# Patient Record
Sex: Female | Born: 1955 | Race: White | Hispanic: No | Marital: Married | Smoking: Former smoker
Health system: Southern US, Community
[De-identification: ages and names within clinical notes are randomized; demographics above are authoritative.]

## PROBLEM LIST (undated history)

## (undated) DIAGNOSIS — F418 Other specified anxiety disorders: Secondary | ICD-10-CM

## (undated) DIAGNOSIS — F419 Anxiety disorder, unspecified: Secondary | ICD-10-CM

## (undated) DIAGNOSIS — J189 Pneumonia, unspecified organism: Secondary | ICD-10-CM

## (undated) DIAGNOSIS — C3491 Malignant neoplasm of unspecified part of right bronchus or lung: Secondary | ICD-10-CM

## (undated) DIAGNOSIS — F329 Major depressive disorder, single episode, unspecified: Secondary | ICD-10-CM

## (undated) DIAGNOSIS — F32A Depression, unspecified: Secondary | ICD-10-CM

## (undated) HISTORY — DX: Other specified anxiety disorders: F41.8

## (undated) HISTORY — DX: Malignant neoplasm of unspecified part of right bronchus or lung: C34.91

---

## 1986-01-04 HISTORY — PX: ECTOPIC PREGNANCY SURGERY: SHX613

## 2012-12-26 ENCOUNTER — Ambulatory Visit (INDEPENDENT_AMBULATORY_CARE_PROVIDER_SITE_OTHER): Payer: BC Managed Care – PPO | Admitting: Adult Health

## 2012-12-26 ENCOUNTER — Encounter (INDEPENDENT_AMBULATORY_CARE_PROVIDER_SITE_OTHER): Payer: Self-pay

## 2012-12-26 ENCOUNTER — Encounter: Payer: Self-pay | Admitting: Adult Health

## 2012-12-26 VITALS — BP 122/80 | HR 61 | Temp 97.8°F | Resp 14 | Ht 62.0 in | Wt 121.0 lb

## 2012-12-26 DIAGNOSIS — Z716 Tobacco abuse counseling: Secondary | ICD-10-CM | POA: Insufficient documentation

## 2012-12-26 DIAGNOSIS — Z1239 Encounter for other screening for malignant neoplasm of breast: Secondary | ICD-10-CM

## 2012-12-26 DIAGNOSIS — Z0000 Encounter for general adult medical examination without abnormal findings: Secondary | ICD-10-CM

## 2012-12-26 DIAGNOSIS — F17200 Nicotine dependence, unspecified, uncomplicated: Secondary | ICD-10-CM

## 2012-12-26 DIAGNOSIS — Z1211 Encounter for screening for malignant neoplasm of colon: Secondary | ICD-10-CM

## 2012-12-26 DIAGNOSIS — Z7189 Other specified counseling: Secondary | ICD-10-CM

## 2012-12-26 MED ORDER — BUSPIRONE HCL 5 MG PO TABS: 5.0000 mg | ORAL_TABLET | Freq: Two times a day (BID) | ORAL | Status: DC

## 2012-12-26 MED ORDER — BUPROPION HCL ER (XL) 300 MG PO TB24: 300.0000 mg | ORAL_TABLET | Freq: Every day | ORAL | Status: DC

## 2012-12-26 NOTE — Assessment & Plan Note (Signed)
Patient has never had a mammogram. Mammogram ordered

## 2012-12-26 NOTE — Assessment & Plan Note (Addendum)
Normal physical exam excluding GYN/PAP. Patient reports having GYN exam in March 2013 prior to moving to [place]. Request medical records. Check routine labs: CBC with differential, comprehensive metabolic panel, TSH, lipids, vitamin D and B12.

## 2012-12-26 NOTE — Progress Notes (Signed)
Subjective:    Patient ID: Deanna Blair, female    DOB: Jan 01, 1956, 57 y.o.   MRN: [id number]  HPI  Patient is a pleasant 57 year old female who presents to clinic to establish care. She recently moved from [place] state. Overall, she is feeling well. She reports never having a mammogram or colonoscopy. She does not get a flu shot. She will be getting her tetanus vaccine through her employer since this has offered to her for free. Patient reports a history of depression with anxiety which is well controlled with Wellbutrin and BuSpar. She needs refills on these medications. Patient is currently a smoker. She is ready to quit and is anticipating trying this in a year. Reports smoking approximately 5 cigarettes a day.    Past Medical History  Diagnosis Date  . Depression with anxiety     Well controlled with Wellbutrin and Buspar  . Cervical cancer 1990     Past Surgical History  Procedure Laterality Date  . Ectopic pregnancy surgery  1988     Family History  Problem Relation Age of Onset  . Cancer Father     Prostate Cancer  . Hypertension Father   . Stroke Father   . Hypertension Brother      History   Social History  . Marital Status: Married    Spouse Name: Molly Maduro    Number of Children: 1  . Years of Education: 12   Occupational History  . 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 Smoking status: Light Tobacco Smoker -- 0.00 packs/day    Types: Cigarettes  . Smokeless tobacco: Not on file  . Alcohol Use: 3.0 oz/week    5 Cans of beer per week     Comment: 5 beers weekly  . Drug Use: No  . Sexual Activity: Yes    Partners: Male   Other Topics Concern  . Not on file   Social History Narrative   [name] was born in [place], [place]. She moved with her family with [facility] and lived there for 13 years. She recently moved to [place] approximately 1 year ago. She lives with her husband of 28 years. They have an adult  daughter who lives in [place]. 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 She is enjoying driving around and getting to know [place]. She and her husband enjoy cooking a backing. They also enjoy hiking when the whether permits.    Review of Systems  Constitutional: Negative.   HENT: Negative.   Eyes: Negative.   Respiratory: Negative.   Cardiovascular: Negative.   Gastrointestinal: Negative.   Endocrine: Negative.   Genitourinary: Negative.   Musculoskeletal: Negative.   Skin: Negative.   Allergic/Immunologic: Negative.   Neurological: Negative.   Hematological: Negative.   Psychiatric/Behavioral: Negative for suicidal ideas, behavioral problems, confusion, sleep disturbance, self-injury and agitation. The patient is nervous/anxious.        Objective:   Physical Exam  Constitutional: She is oriented to person, place, and time. She appears well-developed and well-nourished. No distress.  HENT:  Head: Normocephalic and atraumatic.  Right Ear: External ear normal.  Left Ear: External ear normal.  Nose: Nose normal.  Mouth/Throat: Oropharynx is clear and moist.  Eyes: Conjunctivae and EOM are normal. Pupils are equal, round, and reactive to light.  Neck: Normal range of motion. Neck supple. No tracheal deviation present. No thyromegaly present.  Cardiovascular: Normal rate, regular rhythm, normal  heart sounds and intact distal pulses.  Exam reveals no gallop and no friction rub.   No murmur heard. Pulmonary/Chest: Effort normal and breath sounds normal. No respiratory distress. She has no wheezes. She has no rales.  Abdominal: Soft. Bowel sounds are normal. She exhibits no distension and no mass. There is no tenderness. There is no rebound and no guarding.  Musculoskeletal: Normal range of motion. She exhibits no edema and no tenderness.  Lymphadenopathy:    She has no cervical adenopathy.  Neurological: She is alert and oriented to person, place, and  time. She has normal reflexes. No cranial nerve deficit. Coordination normal.  Skin: Skin is warm and dry.  Psychiatric: She has a normal mood and affect. Her behavior is normal. Judgment and thought content normal.   BP 122/80  Pulse 61  Temp(Src) 97.8 F (36.6 C) (Oral)  Resp 14  Ht 5\' 2"  (1.575 m)  Wt 121 lb (54.885 kg)  BMI 22.13 kg/m2  SpO2 98%        Assessment & Plan:

## 2012-12-26 NOTE — Patient Instructions (Signed)
  Please schedule your Mammogram at your earliest convenience.  I am referring you to have a screening colonoscopy. The GI office will call you with an appointment.  Return for your fasting labs at your earliest convenience. Once the results are available the office will contact you.  Please call the office when you receive your vaccines at work so that we may update your records in our office.  Remember to activate your MyChart account so that you can access your medical records through your email.  I sent in a refill on both your medications to [facility].

## 2012-12-26 NOTE — Progress Notes (Signed)
Pre visit review using our clinic review tool, if applicable. No additional management support is needed unless otherwise documented below in the visit note. 

## 2012-12-26 NOTE — Assessment & Plan Note (Signed)
Patient had quit smoking but recently resumed. She reports smoking approximately 5 cigarettes daily. She would like to quit and is planning on trying to do so in the new year. Discussed importance of smoking cessation and will continue to encourage her to quit. She had run out of Wellbutrin. Medication has been refilled.

## 2012-12-26 NOTE — Assessment & Plan Note (Signed)
Refer to GI for screening colonoscopy as patient never had one done.

## 2013-01-03 ENCOUNTER — Other Ambulatory Visit (INDEPENDENT_AMBULATORY_CARE_PROVIDER_SITE_OTHER): Payer: BC Managed Care – PPO

## 2013-01-03 DIAGNOSIS — Z0000 Encounter for general adult medical examination without abnormal findings: Secondary | ICD-10-CM

## 2013-01-03 LAB — CBC WITH DIFFERENTIAL/PLATELET
Basophils Absolute: 0 10*3/uL (ref 0.0–0.1)
Eosinophils Absolute: 0.2 10*3/uL (ref 0.0–0.7)
Hemoglobin: 12.5 g/dL (ref 12.0–15.0)
Lymphocytes Relative: 26.5 % (ref 12.0–46.0)
MCHC: 34.3 g/dL (ref 30.0–36.0)
Monocytes Relative: 8.9 % (ref 3.0–12.0)
Neutro Abs: 3.2 10*3/uL (ref 1.4–7.7)
Neutrophils Relative %: 59.5 % (ref 43.0–77.0)
Platelets: 227 10*3/uL (ref 150.0–400.0)
RDW: 13.3 % (ref 11.5–14.6)

## 2013-01-03 LAB — LIPID PANEL
Cholesterol: 222 mg/dL — ABNORMAL HIGH (ref 0–200)
HDL: 57.1 mg/dL (ref >39.00)
Triglycerides: 99 mg/dL (ref 0.0–149.0)

## 2013-01-03 LAB — COMPREHENSIVE METABOLIC PANEL
ALT: 13 U/L (ref 0–35)
AST: 19 U/L (ref 0–37)
Albumin: 4.6 g/dL (ref 3.5–5.2)
CO2: 27 mEq/L (ref 19–32)
Calcium: 9.2 mg/dL (ref 8.4–10.5)
Chloride: 106 mEq/L (ref 96–112)
GFR: 94.6 mL/min (ref >60.00)
Glucose, Bld: 86 mg/dL (ref 70–99)
Sodium: 138 mEq/L (ref 135–145)
Total Bilirubin: 0.9 mg/dL (ref 0.3–1.2)
Total Protein: 7.1 g/dL (ref 6.0–8.3)

## 2013-01-03 LAB — VITAMIN B12: Vitamin B-12: 326 pg/mL (ref 211–911)

## 2013-01-03 LAB — TSH: TSH: 1.37 u[IU]/mL (ref 0.35–5.50)

## 2013-01-04 LAB — VITAMIN D 25 HYDROXY (VIT D DEFICIENCY, FRACTURES): Vit D, 25-Hydroxy: 35 ng/mL (ref 30–89)

## 2013-01-05 ENCOUNTER — Encounter: Payer: Self-pay | Admitting: Adult Health

## 2013-01-26 ENCOUNTER — Encounter: Payer: Self-pay | Admitting: *Deleted

## 2013-02-26 ENCOUNTER — Ambulatory Visit: Payer: BC Managed Care – PPO | Admitting: Internal Medicine

## 2013-02-27 ENCOUNTER — Ambulatory Visit (INDEPENDENT_AMBULATORY_CARE_PROVIDER_SITE_OTHER): Payer: BC Managed Care – PPO | Admitting: Adult Health

## 2013-02-27 ENCOUNTER — Encounter: Payer: Self-pay | Admitting: Adult Health

## 2013-02-27 VITALS — BP 138/72 | HR 57 | Temp 98.1°F | Resp 14 | Wt 123.0 lb

## 2013-02-27 DIAGNOSIS — S20219A Contusion of unspecified front wall of thorax, initial encounter: Secondary | ICD-10-CM | POA: Insufficient documentation

## 2013-02-27 MED ORDER — HYDROCODONE-ACETAMINOPHEN 5-325 MG PO TABS: 1.0000 | ORAL_TABLET | Freq: Four times a day (QID) | ORAL | Status: DC | PRN

## 2013-02-27 MED ORDER — CYCLOBENZAPRINE HCL 5 MG PO TABS: 5.0000 mg | ORAL_TABLET | Freq: Three times a day (TID) | ORAL | Status: DC | PRN

## 2013-02-27 NOTE — Progress Notes (Signed)
Pre visit review using our clinic review tool, if applicable. No additional management support is needed unless otherwise documented below in the visit note. 

## 2013-02-27 NOTE — Patient Instructions (Signed)
  Start Norco for pain. Take 1 tablet every 6 hours as needed. Flexeril is for muscle spasm. You can take 1 tablet 3 times a day as needed. Do not drive while taking this medication.   Rib Contusion A rib contusion (bruise) can occur by a blow to the chest or by a fall against a hard object. Usually these will be much better in a couple weeks. If X-rays were taken today and there are no broken bones (fractures), the diagnosis of bruising is made. However, broken ribs may not show up for several days, or may be discovered later on a routine X-ray when signs of healing show up. If this happens to you, it does not mean that something was missed on the X-ray, but simply that it did not show up on the first X-rays. Earlier diagnosis will not usually change the treatment. HOME CARE INSTRUCTIONS   Avoid strenuous activity. Be careful during activities and avoid bumping the injured ribs. Activities that pull on the injured ribs and cause pain should be avoided, if possible.  For the first day or two, an ice pack used every 20 minutes while awake may be helpful. Put ice in a plastic bag and put a towel between the bag and the skin.  Eat a normal, well-balanced diet. Drink plenty of fluids to avoid constipation.  Take deep breaths several times a day to keep lungs free of infection. Try to cough several times a day. Splint the injured area with a pillow while coughing to ease pain. Coughing can help prevent pneumonia.  Wear a rib belt or binder only if told to do so by your caregiver. If you are wearing a rib belt or binder, you must do the breathing exercises as directed by your caregiver. If not used properly, rib belts or binders restrict breathing which can lead to pneumonia.  Only take over-the-counter or prescription medicines for pain, discomfort, or fever as directed by your caregiver. SEEK MEDICAL CARE IF:   You or your child has an oral temperature above 102 F (38.9 C).  Your baby is older  than 3 months with a rectal temperature of 100.5 F (38.1 C) or higher for more than 1 day.  You develop a cough, with thick or bloody sputum. SEEK IMMEDIATE MEDICAL CARE IF:   You have difficulty breathing.  You feel sick to your stomach (nausea), have vomiting or belly (abdominal) pain.  You have worsening pain, not controlled with medications, or there is a change in the location of the pain.  You develop sweating or radiation of the pain into the arms, jaw or shoulders, or become light headed or faint.  You or your child has an oral temperature above 102 F (38.9 C), not controlled by medicine.  Your or your baby is older than 3 months with a rectal temperature of 102 F (38.9 C) or higher.  Your baby is 9 months old or younger with a rectal temperature of 100.4 F (38 C) or higher. MAKE SURE YOU:   Understand these instructions.  Will watch your condition.  Will get help right away if you are not doing well or get worse. Document Released: 09/15/2000 Document Revised: 04/17/2012 Document Reviewed: 08/09/2007 [facility] Patient Information 2014 [place].

## 2013-02-27 NOTE — Progress Notes (Signed)
Patient ID: Deanna Blair, female   DOB: 1955/02/03, 58 y.o.   MRN: [id number]    Subjective:    Patient ID: Deanna Blair, female    DOB: 1955-05-28, 58 y.o.   MRN: [id number]  HPI  Pt presents to clinic after falling Friday night when she slipped on ice. She landed on the right side injuring her ribs, hips and also hit her head. The fall occurred when she was leaving her home. She fell on the steps. She denies HA, dizziness or visual disturbances. Her most uncomfortable symptoms are rib and hip pain.   Past Medical History  Diagnosis Date  . Depression with anxiety     Well controlled with Wellbutrin and Buspar  . Cervical cancer 1990    Current Outpatient Prescriptions on File Prior to Visit  Medication Sig Dispense Refill  . buPROPion (WELLBUTRIN XL) 300 MG 24 hr tablet Take 1 tablet (300 mg total) by mouth daily.  30 tablet  11  . busPIRone (BUSPAR) 5 MG tablet Take 1 tablet (5 mg total) by mouth 2 (two) times daily.  60 tablet  11   No current facility-administered medications on file prior to visit.    Review of Systems  Constitutional: Negative.   HENT: Negative.   Respiratory: Negative.  Negative for shortness of breath (not taking deep breaths 2/2 pain) and wheezing.   Cardiovascular: Negative.   Musculoskeletal: Back pain: right hip pain, right side rib pain.  Neurological: Negative for dizziness, syncope, speech difficulty, weakness, light-headedness and headaches.  All other systems reviewed and are negative.       Objective:  BP 138/72  Pulse 57  Temp(Src) 98.1 F (36.7 C) (Oral)  Wt 123 lb (55.792 kg)  SpO2 98%   Physical Exam  Constitutional: She is oriented to person, place, and time.  Pleasant female appearing very uncomfortable  Cardiovascular: Normal rate and regular rhythm.   Pulmonary/Chest: Breath sounds normal. No respiratory distress. She has no wheezes. She has no rales.  Not taking deep breaths secondary to pain. Good air  movement throughout  Musculoskeletal: She exhibits edema and tenderness.  Tenderness right side ribs. Guarded movements. Difficulty moving from lying to sitting and sitting to standing.  Neurological: She is alert and oriented to person, place, and time.  Skin: Skin is warm and dry.  No ecchymosis noted  Psychiatric: She has a normal mood and affect. Her behavior is normal. Judgment and thought content normal.       Assessment & Plan:   1. Rib contusion Start Norco and flexeril to help with pain. Ice 3 to 4 times a day for 15 min. Take deep breaths periodically. Avoid strenuous activities or further injury to the area. F/U on Friday. No work until re-evaluated. She has time off from Saturday thru Tuesday of next week. This should give her some time to help with healing.

## 2013-02-28 ENCOUNTER — Telehealth: Payer: Self-pay | Admitting: Adult Health

## 2013-02-28 NOTE — Telephone Encounter (Signed)
Relevant patient education assigned to patient using Emmi. ° °

## 2013-03-02 ENCOUNTER — Ambulatory Visit: Payer: BC Managed Care – PPO | Admitting: Adult Health

## 2013-03-06 ENCOUNTER — Encounter: Payer: Self-pay | Admitting: Adult Health

## 2013-03-06 ENCOUNTER — Telehealth: Payer: Self-pay | Admitting: Adult Health

## 2013-03-06 ENCOUNTER — Ambulatory Visit (INDEPENDENT_AMBULATORY_CARE_PROVIDER_SITE_OTHER)
Admission: RE | Admit: 2013-03-06 | Discharge: 2013-03-06 | Disposition: A | Discharge status: Home / Self Care | Payer: BC Managed Care – PPO | Source: Ambulatory Visit | Attending: Adult Health | Admitting: Adult Health

## 2013-03-06 ENCOUNTER — Ambulatory Visit (INDEPENDENT_AMBULATORY_CARE_PROVIDER_SITE_OTHER): Payer: BC Managed Care – PPO | Admitting: Adult Health

## 2013-03-06 VITALS — BP 128/78 | HR 68 | Temp 98.0°F | Wt 126.0 lb

## 2013-03-06 DIAGNOSIS — M25511 Pain in right shoulder: Secondary | ICD-10-CM

## 2013-03-06 DIAGNOSIS — R0781 Pleurodynia: Secondary | ICD-10-CM

## 2013-03-06 DIAGNOSIS — R079 Chest pain, unspecified: Secondary | ICD-10-CM

## 2013-03-06 DIAGNOSIS — M25519 Pain in unspecified shoulder: Secondary | ICD-10-CM

## 2013-03-06 NOTE — Telephone Encounter (Signed)
Relevant patient education assigned to patient using Emmi. ° °

## 2013-03-06 NOTE — Progress Notes (Signed)
   Subjective:    Patient ID: Deanna Blair, female    DOB: [birth year], 58 y.o.   MRN: [id number]  HPI  Deanna Blair is her for f/u status post fall on 02/23/13 injuring the right side of her ribs. She was started on flexeril and norco for her pain. She is still having pain on that side. She has been out of work 2/2 the injury. She will need to file for FMLA if her employer cannot find light duty work for her to do. She is having pain with raising her right arm above her head, lifting anything above 5 lbs. She is going to try to return to work tomorrow.  Current Outpatient Prescriptions on File Prior to Visit  Medication Sig Dispense Refill  . buPROPion (WELLBUTRIN XL) 300 MG 24 hr tablet Take 1 tablet (300 mg total) by mouth daily.  30 tablet  11  . busPIRone (BUSPAR) 5 MG tablet Take 1 tablet (5 mg total) by mouth 2 (two) times daily.  60 tablet  11  . cyclobenzaprine (FLEXERIL) 5 MG tablet Take 1 tablet (5 mg total) by mouth 3 (three) times daily as needed for muscle spasms.  45 tablet  0  . HYDROcodone-acetaminophen (NORCO/VICODIN) 5-325 MG per tablet Take 1 tablet by mouth every 6 (six) hours as needed.  45 tablet  0   No current facility-administered medications on file prior to visit.      Review of Systems  Cardiovascular: Negative.   Gastrointestinal: Negative.   Musculoskeletal: Positive for myalgias.       Right side rib pain, right shoulder pain/stiff  Psychiatric/Behavioral: Negative.   All other systems reviewed and are negative.       Objective:   Physical Exam  Constitutional: She is oriented to person, place, and time. She appears well-developed and well-nourished.  NAD, guarded movements  Cardiovascular: Normal rate and regular rhythm.   Pulmonary/Chest: Effort normal. No respiratory distress.  Abdominal: Soft.  Musculoskeletal: She exhibits tenderness. She exhibits no edema.  Pain on right side of ribs with raising right arm above her head  Neurological: She is  alert and oriented to person, place, and time.  Psychiatric: She has a normal mood and affect. Her behavior is normal. Judgment and thought content normal.       Assessment & Plan:    1. Rib pain on right side Still having pain on right side of ribs with any movement of arms above her head. Will send for xray to r/o fracture. Continue medication for pain and muscle spasm. Letter to return to work with light duty provided. If her employer does not accept her with light duty she will need to fill out FMLA papers.  - DG Ribs Unilateral Right; Future  2. Right shoulder pain Right shoulder tenderness and tightness with movement. Injured during the fall. - DG Shoulder Right; Future

## 2013-03-06 NOTE — Progress Notes (Signed)
Pre visit review using our clinic review tool, if applicable. No additional management support is needed unless otherwise documented below in the visit note. 

## 2013-03-06 NOTE — Telephone Encounter (Signed)
Pt stated she called her work  They will not let her come back to work   [place] will try to get a hold of [employer] to get them to fax over flma paperwork  If they cannot in touch with them   The hr person will fax over generic flma paperwrk  Please let pt know when this has been received

## 2013-03-07 NOTE — Telephone Encounter (Signed)
Received FLMA paper work, gave to R. Rey, advised pt that we have them as requested

## 2013-03-08 ENCOUNTER — Telehealth: Payer: Self-pay | Admitting: *Deleted

## 2013-03-08 NOTE — Telephone Encounter (Signed)
Pt notified that Raquel has received FMLA forms, but has not started on them yet. Pt verbalized understanding.

## 2013-03-08 NOTE — Telephone Encounter (Signed)
Pt called asking for status update on her FMLA forms that we received yesterday. Pt states that you were aware of how important those forms were for her. Please let her know something today. Forms need to be faxed to [facility] asap.

## 2013-03-12 ENCOUNTER — Telehealth: Payer: Self-pay | Admitting: *Deleted

## 2013-03-12 ENCOUNTER — Telehealth: Payer: Self-pay | Admitting: Adult Health

## 2013-03-12 NOTE — Telephone Encounter (Signed)
FMLA form complete

## 2013-03-12 NOTE — Telephone Encounter (Signed)
Advised pt that FMLA paperwork was faxed to [facility]

## 2013-03-13 ENCOUNTER — Encounter: Payer: Self-pay | Admitting: Adult Health

## 2013-03-13 ENCOUNTER — Ambulatory Visit (INDEPENDENT_AMBULATORY_CARE_PROVIDER_SITE_OTHER): Payer: BC Managed Care – PPO | Admitting: Adult Health

## 2013-03-13 ENCOUNTER — Telehealth: Payer: Self-pay | Admitting: Adult Health

## 2013-03-13 ENCOUNTER — Telehealth: Payer: Self-pay | Admitting: *Deleted

## 2013-03-13 VITALS — BP 120/70 | HR 69 | Resp 14 | Wt 126.0 lb

## 2013-03-13 DIAGNOSIS — S2231XA Fracture of one rib, right side, initial encounter for closed fracture: Secondary | ICD-10-CM

## 2013-03-13 DIAGNOSIS — S2239XA Fracture of one rib, unspecified side, initial encounter for closed fracture: Secondary | ICD-10-CM

## 2013-03-13 MED ORDER — HYDROCODONE-ACETAMINOPHEN 5-325 MG PO TABS: 1.0000 | ORAL_TABLET | Freq: Four times a day (QID) | ORAL | Status: DC | PRN

## 2013-03-13 MED ORDER — CYCLOBENZAPRINE HCL 5 MG PO TABS: 5.0000 mg | ORAL_TABLET | Freq: Three times a day (TID) | ORAL | Status: DC | PRN

## 2013-03-13 NOTE — Telephone Encounter (Signed)
Pt returned to office, having spoken to HR. She was told Raquel writing an addendum would be adequate. They recommended choosing a date 3-4 weeks out as a return to work date. Pt states she was told she could return earlier if she was improving.

## 2013-03-13 NOTE — Progress Notes (Signed)
Pre visit review using our clinic review tool, if applicable. No additional management support is needed unless otherwise documented below in the visit note. 

## 2013-03-13 NOTE — Telephone Encounter (Signed)
Relevant patient education assigned to patient using Emmi. ° °

## 2013-03-13 NOTE — Progress Notes (Signed)
Patient ID: Deanna Blair, female   DOB: November 14, 1955, 58 y.o.   MRN: [id number]    Subjective:    Patient ID: Deanna Blair, female    DOB: May 27, 1955, 58 y.o.   MRN: [id number]  HPI  Pt is a 58 y/o female who presents for follow up of right side rib fracture following a fall on 02/23/13 when she slipped on ice at home. I sent in her FMLA documents yesterday; however, she reports that she was not allowed to return to work 2/2 injury occuring in her home. Not exactly sure about the thought process and politics involving her returning to work. The initial plan that we discussed during previous visit was that she felt she could return to work but on light duty. 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 She cannot lift anything heavy above her head with the fracture of her ninth rib on the right. She cannot do any twisting motions. Deanna Blair has felt some improvement of her symptoms but she is still in considerable pain especially with certain movements. She reports pain with turning in bed and those listed above.   Past Medical History  Diagnosis Date  . Depression with anxiety     Well controlled with Wellbutrin and Buspar  . Cervical cancer 1990    Current Outpatient Prescriptions on File Prior to Visit  Medication Sig Dispense Refill  . buPROPion (WELLBUTRIN XL) 300 MG 24 hr tablet Take 1 tablet (300 mg total) by mouth daily.  30 tablet  11  . busPIRone (BUSPAR) 5 MG tablet Take 1 tablet (5 mg total) by mouth 2 (two) times daily.  60 tablet  11   No current facility-administered medications on file prior to visit.     Review of Systems  Constitutional: Positive for activity change (secondary to recent fall).  Respiratory: Negative.   Musculoskeletal:       Right side rib pain. Decreased ROM. Reporting some muscle spasms.  All other systems reviewed and are negative.       Objective:  BP 120/70  Pulse 69  Resp  14  Wt 126 lb (57.153 kg)  SpO2 98%   Physical Exam  Constitutional: She is oriented to person, place, and time. She appears well-developed and well-nourished.  Appears uncomfortable. Cannot sit upright in the chair. Needs to extend her right leg out to help with body alignment and decrease pain.  HENT:  Head: Normocephalic and atraumatic.  Cardiovascular: Normal rate and regular rhythm.   Pulmonary/Chest: Effort normal. No respiratory distress.  Musculoskeletal: She exhibits tenderness.  ROM is improving but still not normal. Cannot lift heavy objects above her head. For example, cannot lift gallon of milk to place on shelf that is above her head.  Neurological: She is alert and oriented to person, place, and time.  Psychiatric: She has a normal mood and affect. Her behavior is normal. Judgment and thought content normal.      Assessment & Plan:   1. Right rib fracture Improving but still having significant pain. Recommended that she return to work on [employer] duty; however, employer is not allowing her to return "secondary to the injury occuring in pt's home". Send in prescription for flexeril for muscle spasms. She is compensating with other areas and this is causing some spasms. Gave pt prescription for Norco to help with her pain. Discussed with pt that rib fractures take time to fully heal. It is hard  to say specifically when she will be back to 100%. I will see her back in clinic for f/u in 2 weeks or sooner if necessary.

## 2013-03-16 DIAGNOSIS — Z0279 Encounter for issue of other medical certificate: Secondary | ICD-10-CM

## 2013-03-16 NOTE — Telephone Encounter (Signed)
Please fax addendum and note for 03/13/13 that I place on your desk

## 2013-03-16 NOTE — Telephone Encounter (Signed)
Faxed

## 2013-03-30 ENCOUNTER — Encounter: Payer: Self-pay | Admitting: Adult Health

## 2013-03-30 ENCOUNTER — Ambulatory Visit (INDEPENDENT_AMBULATORY_CARE_PROVIDER_SITE_OTHER): Payer: BC Managed Care – PPO | Admitting: Adult Health

## 2013-03-30 VITALS — BP 132/80 | HR 71 | Temp 98.1°F | Wt 124.0 lb

## 2013-03-30 DIAGNOSIS — S2239XA Fracture of one rib, unspecified side, initial encounter for closed fracture: Secondary | ICD-10-CM

## 2013-03-30 DIAGNOSIS — S2231XA Fracture of one rib, right side, initial encounter for closed fracture: Secondary | ICD-10-CM

## 2013-03-30 NOTE — Progress Notes (Signed)
Pre visit review using our clinic review tool, if applicable. No additional management support is needed unless otherwise documented below in the visit note. 

## 2013-03-30 NOTE — Progress Notes (Signed)
Patient ID: Cammi Consalvo, female   DOB: 1955-05-08, 58 y.o.   MRN: [id number]    Subjective:    Patient ID: Aishwarya Shiplett, female    DOB: Dec 20, 1955, 58 y.o.   MRN: [id number]  HPI  Pt is a 58 y/o female who presents for follow up of right side rib fracture following a fall on 02/23/13 when she slipped on ice at home.  Recap from visit on 03/13/13: I sent in her FMLA documents 03/12/13 ; however, she reports that she was not allowed to return to work 2/2 injury occuring in her home. Not exactly sure about the thought process and politics involving her returning to work. The initial plan that we discussed during previous visit was that she felt she could return to work but on light duty. 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 She cannot lift anything heavy above her head with the fracture of her ninth rib on the right. She cannot do any twisting motions. Paislei has felt some improvement of her symptoms but she is still in considerable pain especially with certain movements. She reports pain with turning in bed and those listed above.  Today's visit for followup: She feels improvement in overall symptoms. She is tolerating sitting better. Rode to [place] and did well other than with bumps on the road. She has been increasing her activity at home in an effort to identify her activity level and what she can tolerate going back to work. She worked outside in the yard last weekend (Sat & Sun) and reports "I was down all day on Tuesday". I suspect she over did herself. Still has tenderness on the right side. Not taking any pain medications. She reports some burning sensation at the site of fracture. She can lift her arms above her head without much discomfort. For work, she will need to be able to lift 50 lbs.   Past Medical History  Diagnosis Date  . Depression with anxiety     Well controlled with Wellbutrin and Buspar  .  Cervical cancer 1990     Past Surgical History  Procedure Laterality Date  . Ectopic pregnancy surgery  1988     Family History  Problem Relation Age of Onset  . Cancer Father     Prostate Cancer  . Hypertension Father   . Stroke Father   . Hypertension Brother      History   Social History  . Marital Status: Married    Spouse Name: Herbie Baltimore    Number of Children: 1  . Years of Education: 12   Occupational History  . 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 Smoking status: Light Tobacco Smoker -- 0.00 packs/day    Types: Cigarettes  . Smokeless tobacco: Not on file  . Alcohol Use: 3.0 oz/week    5 Cans of beer per week     Comment: 5 beers weekly  . Drug Use: No  . Sexual Activity: Yes    Partners: Male   Other Topics Concern  . Not on file   Social History Narrative   [name] was born in [place], [place]. She moved with her family with [facility] and lived there for 13 years. She recently moved to [place] approximately 1 year ago. She lives with her husband of 28 years. They have an adult daughter who lives in [place]. 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 She is enjoying driving around and getting to know [place]. She and her husband enjoy cooking and baking. They also enjoy hiking when the weather permits.     Current Outpatient Prescriptions on File Prior to Visit  Medication Sig Dispense Refill  . buPROPion (WELLBUTRIN XL) 300 MG 24 hr tablet Take 1 tablet (300 mg total) by mouth daily.  30 tablet  11  . busPIRone (BUSPAR) 5 MG tablet Take 1 tablet (5 mg total) by mouth 2 (two) times daily.  60 tablet  11   No current facility-administered medications on file prior to visit.     Review of Systems  Respiratory: Negative.   Cardiovascular: Negative.   Musculoskeletal:       Tenderness on right lower side of ribs at site of fracture. Mild burning sensation.  Neurological: Negative.     Psychiatric/Behavioral: Negative.   All other systems reviewed and are negative.       Objective:  BP 132/80  Pulse 71  Temp(Src) 98.1 F (36.7 C) (Oral)  Wt 124 lb (56.246 kg)  SpO2 98%   Physical Exam  Constitutional: She is oriented to person, place, and time. She appears well-developed and well-nourished. No distress.  Cardiovascular: Normal rate and regular rhythm.   Pulmonary/Chest: Effort normal. No respiratory distress.  Musculoskeletal: Normal range of motion.  Neurological: She is alert and oriented to person, place, and time.  Skin:  Skin is intact. No bruising noted at site of injury  Psychiatric: She has a normal mood and affect. Her behavior is normal. Judgment and thought content normal.       Assessment & Plan:     1. Right rib fracture Repeat xray in 1 week to evaluate healing process. She is improving which is already indicative that things are improving. I am concerned about her overdoing. Explained that she will not be working outside in the yard when she returns to work at [employer]. Would want her to increase her activity to depict a more realistic day at work. Overdoing it may set her back. Also, instructed to take Ibuprofen every 6 hours as needed for pain, discomfort and for swelling. She was concerned about taking too much ibuprofen. Discussed that this would only be short term to help with her symptoms as she was healing. Pt agreed. She will RTC to see me if necessary. If xray does not show any new abnormal changes, will plan on her returning to work on April 09, 2013.

## 2013-03-30 NOTE — Patient Instructions (Signed)
  Please go to the [facility] next Friday for repeat xray of your ribs.  I have provided you with a letter to return to work on April 09, 2013. Please call me if anything changes pertaining to your symptoms.

## 2013-04-09 ENCOUNTER — Ambulatory Visit (INDEPENDENT_AMBULATORY_CARE_PROVIDER_SITE_OTHER)
Admission: RE | Admit: 2013-04-09 | Discharge: 2013-04-09 | Disposition: A | Discharge status: Home / Self Care | Payer: BC Managed Care – PPO | Source: Ambulatory Visit | Attending: Adult Health | Admitting: Adult Health

## 2013-04-09 ENCOUNTER — Encounter: Payer: Self-pay | Admitting: Adult Health

## 2013-04-09 DIAGNOSIS — S2239XA Fracture of one rib, unspecified side, initial encounter for closed fracture: Secondary | ICD-10-CM

## 2013-04-16 ENCOUNTER — Telehealth: Payer: Self-pay | Admitting: Adult Health

## 2013-04-16 NOTE — Telephone Encounter (Signed)
Pt called stating she would like to go for x-rays of ribs again on Friday at [facility] office.

## 2013-04-16 NOTE — Telephone Encounter (Signed)
Pt wasn't completely healed when she had last x-ray done and would like to go this coming up Friday to have the x-ray repeated.  Pt is unable to go back to work until the x-ray shows she is healed.

## 2013-04-16 NOTE — Telephone Encounter (Signed)
Pt called.  Asking if R. Rey could send a message through [place] giving her permission to go back to work.  Pt states she is ready mentally and if Raquel thinks she is ready physically then she trusts her and is ready to go back to work. States she will need a note stating that she has no limitations.  States she has this week covered but would like to return to work on 4/20.  Please call pt with any questions.  States she dropped off paperwork today for Bebe Liter to return to work.

## 2013-04-16 NOTE — Telephone Encounter (Signed)
Message for permission to return to work

## 2013-04-16 NOTE — Telephone Encounter (Signed)
A follow up xray is not necessary. It will take months for complete healing to occur.

## 2013-04-16 NOTE — Telephone Encounter (Signed)
Pt left vm.  Checking status of x-rays to be done Friday at [facility]

## 2013-04-16 NOTE — Telephone Encounter (Signed)
Pt wants to know if she is okay to return to work?

## 2013-05-15 ENCOUNTER — Telehealth: Payer: Self-pay | Admitting: Adult Health

## 2013-05-15 NOTE — Telephone Encounter (Signed)
Pt came in today needing a note from [place] stating pt was to be out of work from 04/12/13 to 04/23/13   This needs to be sent to [place]  and her HR person Gardiner Ramus  [phone]  This needs to be in the office by 05/16/13  Forms in box.   Pt wasn't for sure exactly what needed to be sent.

## 2013-05-15 NOTE — Telephone Encounter (Signed)
Paperwork given to Raquel.

## 2013-05-21 ENCOUNTER — Encounter: Payer: Self-pay | Admitting: Adult Health

## 2013-11-12 ENCOUNTER — Ambulatory Visit (INDEPENDENT_AMBULATORY_CARE_PROVIDER_SITE_OTHER)
Admission: RE | Admit: 2013-11-12 | Discharge: 2013-11-12 | Disposition: A | Discharge status: Home / Self Care | Payer: BC Managed Care – PPO | Source: Ambulatory Visit | Attending: Internal Medicine | Admitting: Internal Medicine

## 2013-11-12 ENCOUNTER — Encounter: Payer: Self-pay | Admitting: Internal Medicine

## 2013-11-12 ENCOUNTER — Telehealth: Payer: Self-pay

## 2013-11-12 ENCOUNTER — Ambulatory Visit (INDEPENDENT_AMBULATORY_CARE_PROVIDER_SITE_OTHER): Payer: BC Managed Care – PPO | Admitting: Internal Medicine

## 2013-11-12 VITALS — BP 132/84 | HR 58 | Temp 97.9°F | Wt 124.0 lb

## 2013-11-12 DIAGNOSIS — R0781 Pleurodynia: Secondary | ICD-10-CM

## 2013-11-12 DIAGNOSIS — R1011 Right upper quadrant pain: Secondary | ICD-10-CM

## 2013-11-12 LAB — COMPREHENSIVE METABOLIC PANEL
ALBUMIN: 4.2 g/dL (ref 3.5–5.2)
ALT: 10 U/L (ref 0–35)
AST: 23 U/L (ref 0–37)
Alkaline Phosphatase: 57 U/L (ref 39–117)
BUN: 18 mg/dL (ref 6–23)
CALCIUM: 10 mg/dL (ref 8.4–10.5)
CO2: 21 meq/L (ref 19–32)
CREATININE: 0.8 mg/dL (ref 0.4–1.2)
Chloride: 105 mEq/L (ref 96–112)
GFR: 82.96 mL/min (ref >60.00)
Glucose, Bld: 100 mg/dL — ABNORMAL HIGH (ref 70–99)
POTASSIUM: 4.3 meq/L (ref 3.5–5.1)
Sodium: 141 mEq/L (ref 135–145)
Total Bilirubin: 0.6 mg/dL (ref 0.2–1.2)
Total Protein: 7.8 g/dL (ref 6.0–8.3)

## 2013-11-12 NOTE — Patient Instructions (Signed)

## 2013-11-12 NOTE — Telephone Encounter (Signed)
Cassandra with [facility] called report unilateral rt ribs; in pts chart and [facility] notified.

## 2013-11-12 NOTE — Progress Notes (Signed)
Pre visit review using our clinic review tool, if applicable. No additional management support is needed unless otherwise documented below in the visit note. 

## 2013-11-12 NOTE — Progress Notes (Signed)
Subjective:    Patient ID: Deanna Blair, female    DOB: 11-22-1955, 58 y.o.   MRN: [id number]  HPI  Pt presents to the clinic today with c/o right side rib pain. She reports this started about 2 weeks ago. She reports the pain is sharp at times, but mostly just sore. It is more painful when she takes in a deep breath, and tender to touch.  She denies any specific injury to the area but reports she does do a lot of lifting at work. Of note, she does report that she had a broken rib back in March 2015 at that time. Xray report from 03/06/13 shows:  IMPRESSION: Nondisplaced fracture of the right anterolateral ninth rib. No pneumothorax or fracture complication.  She was given Norco for pain relief and taken out of work. She was still having issues a month later. The xray was repeated 04/09/13, which showed:  IMPRESSION: 1. Interval mild displacement of the fracture involving the right ninth anterolateral rib. 2. No new right rib fractures. 3. Tortuous thoracic aorta, unchanged, query hypertension. No acute cardiopulmonary disease  After the 2nd xray, she reports she was advised to take Ibuprofen and was released back to work.  She reports that over the last 6 months, she has not had pain in that area until about 2 weeks ago.  Review of Systems      Past Medical History  Diagnosis Date  . Depression with anxiety     Well controlled with Wellbutrin and Buspar  . Cervical cancer 1990    Current Outpatient Prescriptions  Medication Sig Dispense Refill  . busPIRone (BUSPAR) 5 MG tablet Take 1 tablet (5 mg total) by mouth 2 (two) times daily. 60 tablet 11   No current facility-administered medications for this visit.    No Known Allergies  Family History  Problem Relation Age of Onset  . Cancer Father     Prostate Cancer  . Hypertension Father   . Stroke Father   . Hypertension Brother     History   Social History  . Marital Status: Married    Spouse Name: Herbie Baltimore     Number of Children: 1  . Years of Education: 12   Occupational History  . 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 Smoking status: Light Tobacco Smoker -- 0.00 packs/day    Types: Cigarettes  . Smokeless tobacco: Not on file  . Alcohol Use: 3.0 oz/week    5 Cans of beer per week     Comment: 5 beers weekly  . Drug Use: No  . Sexual Activity:    Partners: Male   Other Topics Concern  . Not on file   Social History Narrative   [name] was born in [place], [place]. She moved with her family with [facility] and lived there for 13 years. She recently moved to [place] approximately 1 year ago. She lives with her husband of 28 years. They have an adult daughter who lives in [place]. 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 She is enjoying driving around and getting to know [place]. She and her husband enjoy cooking and baking. They also enjoy hiking when the weather permits.     Constitutional: Denies fever, malaise, fatigue, headache or abrupt weight changes.  HEENT: Denies eye pain, eye redness, ear pain, ringing in the ears, wax buildup, runny nose, nasal congestion, bloody nose, or sore throat. Respiratory:  Pt reports pain with taking a deep breath. Denies difficulty breathing, shortness of breath, cough or sputum production.   Cardiovascular: Denies chest pain, chest tightness, palpitations or swelling in the hands or feet.  Gastrointestinal: Denies abdominal pain, bloating, constipation, diarrhea or blood in the stool.  Musculoskeletal: Pt reports right rib pain. Denies decrease in range of motion, difficulty with gait, muscle pain or joint pain and swelling.    No other specific complaints in a complete review of systems (except as listed in HPI above).  Objective:   Physical Exam   BP 132/84 mmHg  Pulse 58  Temp(Src) 97.9 F (36.6 C) (Oral)  Wt 124 lb (56.246 kg)  SpO2 99% Wt Readings from Last 3 Encounters:    11/12/13 124 lb (56.246 kg)  03/30/13 124 lb (56.246 kg)  03/13/13 126 lb (57.153 kg)    General: Appears her stated age, well developed, well nourished in NAD. Cardiovascular: Normal rate and rhythm. S1,S2 noted.  No murmur, rubs or gallops noted.  Pulmonary/Chest: Normal effort and positive vesicular breath sounds. No respiratory distress. No wheezes, rales or ronchi noted.  Abdomen: Soft and tender in the RUQ. Normal bowel sounds, no bruits noted. No distention or masses noted. Liver, spleen and kidneys non palpable. Musculoskeletal: Pain with palpation of the right 12th rib. No pain with palpation of the intercostal spaces.   BMET    Component Value Date/Time   NA 138 01/03/2013 0907   K 4.6 01/03/2013 0907   CL 106 01/03/2013 0907   CO2 27 01/03/2013 0907   GLUCOSE 86 01/03/2013 0907   BUN 20 01/03/2013 0907   CREATININE 0.7 01/03/2013 0907   CALCIUM 9.2 01/03/2013 0907    Lipid Panel     Component Value Date/Time   CHOL 222* 01/03/2013 0907   TRIG 99.0 01/03/2013 0907   HDL 57.10 01/03/2013 0907   CHOLHDL 4 01/03/2013 0907   VLDL 19.8 01/03/2013 0907    CBC    Component Value Date/Time   WBC 5.4 01/03/2013 0907   RBC 3.71* 01/03/2013 0907   HGB 12.5 01/03/2013 0907   HCT 36.3 01/03/2013 0907   PLT 227.0 01/03/2013 0907   MCV 98.0 01/03/2013 0907   MCHC 34.3 01/03/2013 0907   RDW 13.3 01/03/2013 0907   LYMPHSABS 1.4 01/03/2013 0907   MONOABS 0.5 01/03/2013 0907   EOSABS 0.2 01/03/2013 0907   BASOSABS 0.0 01/03/2013 0907    Hgb A1C No results found for: HGBA1C      Assessment & Plan:   Rib pain, right side/RUQ pain side:  Will repeat xray of ribs Will check CMET to make sure no liver injury/issue Avoid lifting > 15 lbs over the next 1-2 weeks Take Ibuprofen/heatng pad to help ease the pain  Will call you with the xray results/more definitive plan

## 2013-11-12 NOTE — Progress Notes (Signed)
HPI  [name] is a 58 yr old female being seen today for continuing rib pain. She fell approximately 6 months ago in Feb and had a rib x-ray that showed fractured ninth rib. She has a repeat x-ray in April prior to returning to work that showed displacement of 9th rib, but negative for new fractures.  Today she is concerned because she is having intermittent pain with breathing. She reports the pain as sharp with deep breaths. She denies any falls, cough, hitting/bumping into any objects, abdominal pain, weakness, malaise, n/v/d, or change in stools. She denies any tenderness or recent injury at work. She is expected to lift up to 50lbs at work. She denies any swelling or bruising. She has tried OTC aleve and ibuprofen with some relief.  Past Medical History  Diagnosis Date  . Depression with anxiety     Well controlled with Wellbutrin and Buspar  . Cervical cancer 1990    Current Outpatient Prescriptions  Medication Sig Dispense Refill  . busPIRone (BUSPAR) 5 MG tablet Take 1 tablet (5 mg total) by mouth 2 (two) times daily. 60 tablet 11   No current facility-administered medications for this visit.    No Known Allergies  Family History  Problem Relation Age of Onset  . Cancer Father     Prostate Cancer  . Hypertension Father   . Stroke Father   . Hypertension Brother     History   Social History  . Marital Status: Married    Spouse Name: Herbie Baltimore    Number of Children: 1  . Years of Education: 12   Occupational History  . 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 Smoking status: Light Tobacco Smoker -- 0.00 packs/day    Types: Cigarettes  . Smokeless tobacco: Not on file  . Alcohol Use: 3.0 oz/week    5 Cans of beer per week     Comment: 5 beers weekly  . Drug Use: No  . Sexual Activity:    Partners: Male   Other Topics Concern  . Not on file   Social History Narrative   Deanna Blair was born in [place], [place]. She moved with her family  with [facility] and lived there for 13 years. She recently moved to [place] approximately 1 year ago. She lives with her husband of 28 years. They have an adult daughter who lives in [place]. 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. She is enjoying driving around and getting to know [place]. She and her husband enjoy cooking and baking. They also enjoy hiking when the weather permits.    ROS:  Constitutional: Denies fever, malaise, fatigue, headache or abrupt weight changes.  Respiratory: Denies difficulty breathing, shortness of breath, cough or sputum production.   Cardiovascular: Denies chest pain, chest tightness, palpitations or swelling in the hands or feet.  Gastrointestinal: Denies abdominal pain, bloating, constipation, diarrhea or blood in the stool.  Musculoskeletal: Endorses right rib pain with deep breathes. Denies decrease in range of motion, difficulty with gait, injury, and swelling.  Skin: Denies redness, rashes, lesions or ulcercations.    No other specific complaints in a complete review of systems (except as listed in HPI above).  PE:  BP 132/84 mmHg  Pulse 58  Temp(Src) 97.9 F (36.6 C) (Oral)  Wt 124 lb (56.246 kg)  SpO2 99% Wt Readings from Last 3 Encounters:  11/12/13 124 lb (56.246 kg)  03/30/13 124 lb (  56.246 kg)  03/13/13 126 lb (57.153 kg)    General: Appears their stated age, well developed, well nourished in NAD.  Cardiovascular: Normal rate and rhythm. S1,S2 noted.  No murmur, rubs or gallops noted. No JVD or BLE edema. No carotid bruits noted. Pulmonary/Chest: Normal effort and positive vesicular breath sounds. No respiratory distress. No wheezes, rales or ronchi noted.  Abdomen: Soft. Tender to palpation to right mid/upper quadrant. Guarding to percussion and palpation of right mid/upper abdomen along liver border. Normal bowel sounds, no bruits noted. No distention or masses noted. Liver, spleen and kidneys non  palpable. Musculoskeletal: Normal range of motion. No signs of joint swelling. No difficulty with gait. Tenderness/guarding to palpation to 11-12th ribs. Pain to palpation of bone and in between intercostal borders.  Psychiatric: Mood and affect normal. Behavior is normal. Judgment and thought content normal.   EKG:  BMET    Component Value Date/Time   NA 138 01/03/2013 0907   K 4.6 01/03/2013 0907   CL 106 01/03/2013 0907   CO2 27 01/03/2013 0907   GLUCOSE 86 01/03/2013 0907   BUN 20 01/03/2013 0907   CREATININE 0.7 01/03/2013 0907   CALCIUM 9.2 01/03/2013 0907    Lipid Panel     Component Value Date/Time   CHOL 222* 01/03/2013 0907   TRIG 99.0 01/03/2013 0907   HDL 57.10 01/03/2013 0907   CHOLHDL 4 01/03/2013 0907   VLDL 19.8 01/03/2013 0907    CBC    Component Value Date/Time   WBC 5.4 01/03/2013 0907   RBC 3.71* 01/03/2013 0907   HGB 12.5 01/03/2013 0907   HCT 36.3 01/03/2013 0907   PLT 227.0 01/03/2013 0907   MCV 98.0 01/03/2013 0907   MCHC 34.3 01/03/2013 0907   RDW 13.3 01/03/2013 0907   LYMPHSABS 1.4 01/03/2013 0907   MONOABS 0.5 01/03/2013 0907   EOSABS 0.2 01/03/2013 0907   BASOSABS 0.0 01/03/2013 0907    Hgb A1C No results found for: HGBA1C   Assessment and Plan:  Right rib pain: This is concerning for possible further injury, area of pain is lower than original x-ray Will obtain right rib x-ray today Will obtain CMET for concern of liver involvement Will call with results and continue plan of care based on results Please try to lighter duty at work until results are in Follow up sooner if needed  Deanna Blair S, [place]

## 2013-11-13 ENCOUNTER — Other Ambulatory Visit: Payer: Self-pay | Admitting: Internal Medicine

## 2013-11-13 ENCOUNTER — Encounter: Payer: Self-pay | Admitting: Internal Medicine

## 2013-11-13 ENCOUNTER — Ambulatory Visit: Payer: Self-pay | Admitting: Internal Medicine

## 2013-11-13 ENCOUNTER — Telehealth: Payer: Self-pay

## 2013-11-13 DIAGNOSIS — R9389 Abnormal findings on diagnostic imaging of other specified body structures: Secondary | ICD-10-CM

## 2013-11-13 DIAGNOSIS — R0781 Pleurodynia: Secondary | ICD-10-CM

## 2013-11-13 DIAGNOSIS — R519 Headache, unspecified: Secondary | ICD-10-CM

## 2013-11-13 DIAGNOSIS — R0789 Other chest pain: Secondary | ICD-10-CM

## 2013-11-13 DIAGNOSIS — R51 Headache: Principal | ICD-10-CM

## 2013-11-13 NOTE — Telephone Encounter (Signed)
Deanna Blair with [facility] called report CT of Chest; rt lower lobe density measures 18 x 40 mm.This contains soft tissue thickening and gas filled cystic space. This could represent resolving pneumonia vs tumor. Initial follow up by chest CT without contrast is recommended in 3 months to confirm persistence. Deanna Blair will fax report to [facility] at [phone]. Called report given to Webb Silversmith NP.

## 2013-11-13 NOTE — Telephone Encounter (Signed)
Noted, will call pt with results

## 2014-01-09 ENCOUNTER — Other Ambulatory Visit: Payer: Self-pay | Admitting: *Deleted

## 2014-01-09 MED ORDER — BUSPIRONE HCL 5 MG PO TABS: 5.0000 mg | ORAL_TABLET | Freq: Two times a day (BID) | ORAL | Status: DC

## 2014-02-04 ENCOUNTER — Ambulatory Visit (INDEPENDENT_AMBULATORY_CARE_PROVIDER_SITE_OTHER): Payer: BLUE CROSS/BLUE SHIELD | Admitting: Internal Medicine

## 2014-02-04 ENCOUNTER — Encounter: Payer: Self-pay | Admitting: Internal Medicine

## 2014-02-04 VITALS — BP 118/72 | HR 74 | Temp 98.7°F | Wt 122.0 lb

## 2014-02-04 DIAGNOSIS — B9789 Other viral agents as the cause of diseases classified elsewhere: Principal | ICD-10-CM

## 2014-02-04 DIAGNOSIS — J069 Acute upper respiratory infection, unspecified: Secondary | ICD-10-CM

## 2014-02-04 MED ORDER — HYDROCODONE-HOMATROPINE 5-1.5 MG/5ML PO SYRP: 5.0000 mL | ORAL_SOLUTION | Freq: Three times a day (TID) | ORAL | Status: DC | PRN

## 2014-02-04 NOTE — Progress Notes (Signed)
   Subjective:    Patient ID: Deanna Blair, female    DOB: 1955-02-27, 59 y.o.   MRN: [id number]  HPI   Deanna Blair is a 59 year old female who presents today with chief complaint of fever, cough and malaise.  She was at work 4 days ago and had to leave because she developed a fever of 104 and felt very tired.  She has had a fever of 100 for the past 2 days, but denies fever today.  She also has a cough that developed 4 days ago, she describes it as non productive.  Cough is worse at night and reports not being able to sleep well. Patient has pain in her ribs from coughing. She has taken OTC Nyquil and Dayquil without relief.  She reports feeling very fatigued.    Past Medical History  Diagnosis Date  . Depression with anxiety     Well controlled with Wellbutrin and Buspar  . Cervical cancer 1990   Family History  Problem Relation Age of Onset  . Cancer Father     Prostate Cancer  . Hypertension Father   . Stroke Father   . Hypertension Brother    Current Outpatient Prescriptions on File Prior to Visit  Medication Sig Dispense Refill  . busPIRone (BUSPAR) 5 MG tablet Take 1 tablet (5 mg total) by mouth 2 (two) times daily. 60 tablet 2   No current facility-administered medications on file prior to visit.    Review of Systems  Constitutional: Positive for fever and fatigue. Negative for chills and unexpected weight change.  HENT: Negative for congestion, postnasal drip, sinus pressure and sore throat.   Respiratory: Positive for cough. Negative for shortness of breath and wheezing.   Cardiovascular: Negative for chest pain, palpitations and leg swelling.  Skin: Negative for color change, pallor, rash and wound.  Neurological: Negative for dizziness, light-headedness and headaches.       Objective:   Physical Exam  Constitutional: She is oriented to person, place, and time. She appears well-developed and well-nourished. No distress.  HENT:  Head: Normocephalic and  atraumatic.  Right Ear: External ear normal.  Left Ear: External ear normal.  Mouth/Throat: Oropharynx is clear and moist. No oropharyngeal exudate.  Neck: Normal range of motion. Neck supple.  Cardiovascular: Normal rate, regular rhythm and normal heart sounds.   Pulmonary/Chest: Effort normal and breath sounds normal. She has no wheezes. She exhibits tenderness.  Musculoskeletal: Normal range of motion.  Lymphadenopathy:    She has no cervical adenopathy.  Neurological: She is alert and oriented to person, place, and time.  Skin: Skin is warm and dry. She is not diaphoretic.  Psychiatric: She has a normal mood and affect.     BP 118/72 mmHg  Pulse 74  Temp(Src) 98.7 F (37.1 C) (Oral)  Wt 122 lb (55.339 kg)  SpO2 98%      Assessment & Plan:  1. Viral upper respiratory infection with cough - In office flu test was negative.  Will Rx for hycodan cough syrup and advised patient to take ibuprofen for body aches, increase fluid intake,  and to call office if symptoms are not better in 4 days.

## 2014-02-04 NOTE — Patient Instructions (Signed)
Cough, Adult  A cough is a reflex that helps clear your throat and airways. It can help heal the body or may be a reaction to an irritated airway. A cough may only last 2 or 3 weeks (acute) or may last more than 8 weeks (chronic).  CAUSES Acute cough:  Viral or bacterial infections. Chronic cough:  Infections.  Allergies.  Asthma.  Post-nasal drip.  Smoking.  Heartburn or acid reflux.  Some medicines.  Chronic lung problems (COPD).  Cancer. SYMPTOMS   Cough.  Fever.  Chest pain.  Increased breathing rate.  High-pitched whistling sound when breathing (wheezing).  Colored mucus that you cough up (sputum). TREATMENT   A bacterial cough may be treated with antibiotic medicine.  A viral cough must run its course and will not respond to antibiotics.  Your caregiver may recommend other treatments if you have a chronic cough. HOME CARE INSTRUCTIONS   Only take over-the-counter or prescription medicines for pain, discomfort, or fever as directed by your caregiver. Use cough suppressants only as directed by your caregiver.  Use a cold steam vaporizer or humidifier in your bedroom or home to help loosen secretions.  Sleep in a semi-upright position if your cough is worse at night.  Rest as needed.  Stop smoking if you smoke. SEEK IMMEDIATE MEDICAL CARE IF:   You have pus in your sputum.  Your cough starts to worsen.  You cannot control your cough with suppressants and are losing sleep.  You begin coughing up blood.  You have difficulty breathing.  You develop pain which is getting worse or is uncontrolled with medicine.  You have a fever. MAKE SURE YOU:   Understand these instructions.  Will watch your condition.  Will get help right away if you are not doing well or get worse. Document Released: 06/19/2010 Document Revised: 03/15/2011 Document Reviewed: 06/19/2010 Exit[employer] Patient Information 2015 [employer]. This information is not intended  to replace advice given to you by your health care provider. Make sure you discuss any questions you have with your health care provider.  

## 2014-02-07 ENCOUNTER — Other Ambulatory Visit: Payer: Self-pay | Admitting: Internal Medicine

## 2014-02-07 ENCOUNTER — Telehealth: Payer: Self-pay

## 2014-02-07 MED ORDER — ONDANSETRON HCL 4 MG PO TABS: 4.0000 mg | ORAL_TABLET | Freq: Three times a day (TID) | ORAL | Status: DC | PRN

## 2014-02-07 NOTE — Telephone Encounter (Signed)
Pt left v/m; pt was seen on 02/04/14; pt went to work on 02/06/14 and pt vomited up breakfast and began sweating and pt was sent home from work; pt last vomited 02/06/14; pt only vomited x 1. Pt said she feels weak today still perspiring. No pain anywhere. Pt is coughing on and off and cough med helps the cough. No fever. Pt wants to know if there is anything else that can be done. Pt request cb.[facility] [address]

## 2014-02-07 NOTE — Telephone Encounter (Signed)
Would she like a RX for antinausea medication such as zofran or phenergan?

## 2014-02-07 NOTE — Telephone Encounter (Signed)
zofran sent to pharmacy. 

## 2014-02-07 NOTE — Telephone Encounter (Signed)
Pt states she would be okay trying the antinausea meds to see if it will help if needed

## 2014-03-27 ENCOUNTER — Ambulatory Visit: Payer: Self-pay | Admitting: Internal Medicine

## 2014-12-11 ENCOUNTER — Ambulatory Visit (INDEPENDENT_AMBULATORY_CARE_PROVIDER_SITE_OTHER): Payer: BLUE CROSS/BLUE SHIELD | Admitting: Physician Assistant

## 2014-12-11 ENCOUNTER — Encounter: Payer: Self-pay | Admitting: Physician Assistant

## 2014-12-11 VITALS — BP 116/78 | HR 82 | Temp 98.2°F | Resp 16 | Ht 62.0 in | Wt 120.8 lb

## 2014-12-11 DIAGNOSIS — Z8541 Personal history of malignant neoplasm of cervix uteri: Secondary | ICD-10-CM | POA: Insufficient documentation

## 2014-12-11 DIAGNOSIS — J069 Acute upper respiratory infection, unspecified: Secondary | ICD-10-CM

## 2014-12-11 DIAGNOSIS — R05 Cough: Secondary | ICD-10-CM

## 2014-12-11 DIAGNOSIS — Z124 Encounter for screening for malignant neoplasm of cervix: Secondary | ICD-10-CM | POA: Diagnosis not present

## 2014-12-11 DIAGNOSIS — Z1211 Encounter for screening for malignant neoplasm of colon: Secondary | ICD-10-CM | POA: Diagnosis not present

## 2014-12-11 DIAGNOSIS — Z0000 Encounter for general adult medical examination without abnormal findings: Secondary | ICD-10-CM

## 2014-12-11 DIAGNOSIS — Z1239 Encounter for other screening for malignant neoplasm of breast: Secondary | ICD-10-CM | POA: Diagnosis not present

## 2014-12-11 DIAGNOSIS — R059 Cough, unspecified: Secondary | ICD-10-CM

## 2014-12-11 DIAGNOSIS — Z720 Tobacco use: Secondary | ICD-10-CM | POA: Insufficient documentation

## 2014-12-11 LAB — POC HEMOCCULT BLD/STL (OFFICE/1-CARD/DIAGNOSTIC): Fecal Occult Blood, POC: NEGATIVE

## 2014-12-11 MED ORDER — HYDROCODONE-HOMATROPINE 5-1.5 MG/5ML PO SYRP: 5.0000 mL | ORAL_SOLUTION | Freq: Three times a day (TID) | ORAL | Status: DC | PRN

## 2014-12-11 MED ORDER — AZITHROMYCIN 250 MG PO TABS: ORAL_TABLET | ORAL | Status: DC

## 2014-12-11 NOTE — Progress Notes (Signed)
Patient: Deanna Blair, Female    DOB: 1955/06/23, 59 y.o.   MRN: [id number] Visit Date: 12/11/2014  Today's Provider: Mar Daring, PA-C   Chief Complaint  Patient presents with  . Annual Exam   Subjective:    Annual physical exam Deanna Blair is a 59 y.o. female who presents today for health maintenance and complete physical. She feels fairly well. She reports exercising, walks at least 5 miles or more sometimes every day. Patient works at [employer]. She reports she is sleeping fairly well.  Patient not feeling very well today. Has been having a cough on and off for last 3 weeks. When patients takes Nyquil to help with the sleep patient has 7 hours in total. Usually is 5 hours in total of sleep. Patient Declined Influenza Vaccine.  -----------------------------------------------------------------   Review of Systems  Constitutional: Positive for chills and fatigue.  HENT: Positive for congestion, ear pain (on and off) and sore throat.   Eyes: Negative.   Respiratory: Positive for cough.   Cardiovascular: Negative.   Gastrointestinal: Negative.   Endocrine: Negative.   Genitourinary: Negative.   Musculoskeletal: Negative.   Skin: Negative.   Allergic/Immunologic: Negative.   Neurological: Negative.   Hematological: Negative.   Psychiatric/Behavioral: Negative.     Social History      She  reports that she has been smoking Cigarettes.  She has been smoking about 0.00 packs per day. She has never used smokeless tobacco. She reports that she drinks about 3.0 oz of alcohol per week. She reports that she does not use illicit drugs.       Social History   Social History  . Marital Status: Married    Spouse Name: Herbie Baltimore  . Number of Children: 1  . Years of Education: 12   Occupational History  . 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 Smoking status: Light Tobacco Smoker -- 0.00 packs/day    Types: Cigarettes  .  Smokeless tobacco: Never Used  . Alcohol Use: 3.0 oz/week    5 Cans of beer per week     Comment: 5 beers weekly  . Drug Use: No  . Sexual Activity:    Partners: Male   Other Topics Concern  . None   Social History Narrative   [name] was born in [place], [place]. She moved with her family with [facility] and lived there for 13 years. She recently moved to [place] approximately 1 year ago. She lives with her husband of 28 years. They have an adult daughter who lives in [place]. 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 She is enjoying driving around and getting to know [place]. She and her husband enjoy cooking and baking. They also enjoy hiking when the weather permits.    Patient Active Problem List   Diagnosis Date Noted  . Right rib fracture 03/13/2013  . Rib contusion 02/27/2013  . Screening for breast cancer 12/26/2012  . Screen for colon cancer 12/26/2012  . Routine general medical examination at a health care facility 12/26/2012  . Tobacco abuse counseling 12/26/2012    Past Surgical History  Procedure Laterality Date  . Ectopic pregnancy surgery  1988    Family History        Family Status  Relation Status Death Age  . Father Deceased   . Mother Deceased   . Brother Alive   . Daughter Alive   .  Brother Alive   . Brother Alive         Her family history includes Cancer in her father; Hypertension in her brother and father; Stroke in her father.    No Known Allergies  Previous Medications   BUSPIRONE (BUSPAR) 5 MG TABLET    Take 1 tablet (5 mg total) by mouth 2 (two) times daily.    Patient Care Team: Mar Daring, PA-C as PCP - General (Family Medicine)     Objective:   Vitals: BP 116/78 mmHg  Pulse 82  Temp(Src) 98.2 F (36.8 C) (Oral)  Resp 16  Ht '5\' 2"'$  (1.575 m)  Wt 120 lb 12.8 oz (54.795 kg)  BMI 22.09 kg/m2   Physical Exam  Constitutional: She is oriented to person, place, and time. She appears  well-developed and well-nourished. No distress.  HENT:  Head: Normocephalic and atraumatic.  Right Ear: Hearing, external ear and ear canal normal. A middle ear effusion is present.  Left Ear: Hearing, external ear and ear canal normal. A middle ear effusion is present.  Nose: Mucosal edema and rhinorrhea present. Right sinus exhibits no maxillary sinus tenderness and no frontal sinus tenderness. Left sinus exhibits no maxillary sinus tenderness and no frontal sinus tenderness.  Mouth/Throat: Uvula is midline, oropharynx is clear and moist and mucous membranes are normal. No oropharyngeal exudate.  Eyes: Conjunctivae and EOM are normal. Pupils are equal, round, and reactive to light. Right eye exhibits no discharge. Left eye exhibits no discharge. No scleral icterus.  Neck: Normal range of motion. Neck supple. No JVD present. Carotid bruit is not present. No tracheal deviation present. No thyromegaly present.  Cardiovascular: Normal rate, regular rhythm, normal heart sounds and intact distal pulses.  Exam reveals no gallop and no friction rub.   No murmur heard. Pulmonary/Chest: Effort normal and breath sounds normal. No accessory muscle usage. No respiratory distress. She has no decreased breath sounds. She has no wheezes. She has no rales. She exhibits no tenderness. Right breast exhibits no inverted nipple, no mass, no nipple discharge, no skin change and no tenderness. Left breast exhibits no inverted nipple, no mass, no nipple discharge, no skin change and no tenderness. Breasts are symmetrical.  Abdominal: Soft. Bowel sounds are normal. She exhibits no distension and no mass. There is no tenderness. There is no rebound and no guarding. Hernia confirmed negative in the right inguinal area and confirmed negative in the left inguinal area.  Genitourinary: Rectum normal, vagina normal and uterus normal. No breast swelling, tenderness, discharge or bleeding. Pelvic exam was performed with patient  supine. There is no rash, tenderness, lesion or injury on the right labia. There is no rash, tenderness, lesion or injury on the left labia. Cervix exhibits no motion tenderness, no discharge and no friability. Right adnexum displays no mass, no tenderness and no fullness. Left adnexum displays no mass, no tenderness and no fullness. No erythema, tenderness or bleeding in the vagina. No signs of injury around the vagina. No vaginal discharge found.  Musculoskeletal: Normal range of motion. She exhibits no edema or tenderness.  Lymphadenopathy:    She has no cervical adenopathy.       Right: No inguinal adenopathy present.       Left: No inguinal adenopathy present.  Neurological: She is alert and oriented to person, place, and time. She has normal reflexes. No cranial nerve deficit. Coordination normal.  Skin: Skin is warm and dry. No rash noted. She is not diaphoretic.  Psychiatric: She  has a normal mood and affect. Her behavior is normal. Judgment and thought content normal.  Vitals reviewed.    Depression Screen No flowsheet data found.    Assessment & Plan:     Routine Health Maintenance and Physical Exam  1. Annual physical exam Exam was normal today. We will check labs as below since it has been a while since she has had her labs checked. I will follow-up with her pending lab results. If labs are stable I will follow-up with her in one year for her repeat physical exam. - CBC with Differential - Comprehensive metabolic panel - Lipid panel - TSH  2. Cervical cancer screening It has been a while since she has had a Pap smear. 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 She was told that the biopsy had clear margins and she did not need to undergo any further treatment. Pap was collected today. I will follow-up with her pending her Pap results. If Pap is normal she will need her repeat Pap in 3  years. I will follow-up with her in one year for repeat annual physical exam. - Pap IG w/ reflex to HPV when [facility] ([facility])  3. Breast cancer screening No known family history of breast cancer. She does perform self breast exams monthly. Order for mammogram has been placed. She was also given a card with the phone number for [facility] breast clinic so that she may call and schedule this appointment at her convenience. - Mammogram Digital Screening; Future  4. Colon cancer screening Hemoccult card was negative today in the office. - POC Hemoccult Bld/Stl (1-Cd Office Dx)  5. Cough Worsening over 3 weeks. I will give Hycodan cough syrup as below. She has had this in the past and this has worked well for her. She is to use this as needed for nighttime cough. She is to call the office if symptoms fail to improve or worsen. - HYDROcodone-homatropine (HYCODAN) 5-1.5 MG/5ML syrup; Take 5 mLs by mouth every 8 (eight) hours as needed for cough.  Dispense: 120 mL; Refill: 0  6. Upper respiratory infection Worsening 3 weeks. I will treat her with the Z-Pak as below. I also advised that she may benefit from an over-the-counter decongestant for the first couple days. She voiced understanding. I also advised for her to try to get rest and to stay well-hydrated. She is to call the office if symptoms fail to improve or worsen. - azithromycin (ZITHROMAX) 250 MG tablet; Take 2 tablets PO on day one, and one tablet PO daily thereafter until completed.  Dispense: 6 tablet; Refill: 0   Exercise Activities and Dietary recommendations Goals    None       There is no immunization history on file for this patient.  Health Maintenance  Topic Date Due  . Hepatitis C Screening  [birth year]  . HIV Screening  06/16/1970  . TETANUS/TDAP  06/16/1974  . PAP SMEAR  06/15/1976  . COLONOSCOPY  06/15/2005  . MAMMOGRAM  01/25/2015  . INFLUENZA VACCINE  08/05/2015      Discussed health benefits of  physical activity, and encouraged her to engage in regular exercise appropriate for her age and condition.    --------------------------------------------------------------------

## 2014-12-11 NOTE — Patient Instructions (Signed)
Health Maintenance, Female Adopting a healthy lifestyle and getting preventive care can go a long way to promote health and wellness. Talk with your health care provider about what schedule of regular examinations is right for you. This is a good chance for you to check in with your provider about disease prevention and staying healthy. In between checkups, there are plenty of things you can do on your own. Experts have done a lot of research about which lifestyle changes and preventive measures are most likely to keep you healthy. Ask your health care provider for more information. WEIGHT AND DIET  Eat a healthy diet  Be sure to include plenty of vegetables, fruits, low-fat dairy products, and lean protein.  Do not eat a lot of foods high in solid fats, added sugars, or salt.  Get regular exercise. This is one of the most important things you can do for your health.  Most adults should exercise for at least 150 minutes each week. The exercise should increase your heart rate and make you sweat (moderate-intensity exercise).  Most adults should also do strengthening exercises at least twice a week. This is in addition to the moderate-intensity exercise.  Maintain a healthy weight  Body mass index (BMI) is a measurement that can be used to identify possible weight problems. It estimates body fat based on height and weight. Your health care provider can help determine your BMI and help you achieve or maintain a healthy weight.  For females 28 years of age and older:   A BMI below 18.5 is considered underweight.  A BMI of 18.5 to 24.9 is normal.  A BMI of 25 to 29.9 is considered overweight.  A BMI of 30 and above is considered obese.  Watch levels of cholesterol and blood lipids  You should start having your blood tested for lipids and cholesterol at 59 years of age, then have this test every 5 years.  You may need to have your cholesterol levels checked more often if:  Your lipid  or cholesterol levels are high.  You are older than 59 years of age.  You are at high risk for heart disease.  CANCER SCREENING   Lung Cancer  Lung cancer screening is recommended for adults 75-66 years old who are at high risk for lung cancer because of a history of smoking.  A yearly low-dose CT scan of the lungs is recommended for people who:  Currently smoke.  Have quit within the past 15 years.  Have at least a 30-pack-year history of smoking. A pack year is smoking an average of one pack of cigarettes a day for 1 year.  Yearly screening should continue until it has been 15 years since you quit.  Yearly screening should stop if you develop a health problem that would prevent you from having lung cancer treatment.  Breast Cancer  Practice breast self-awareness. This means understanding how your breasts normally appear and feel.  It also means doing regular breast self-exams. Let your health care provider know about any changes, no matter how small.  If you are in your 20s or 30s, you should have a clinical breast exam (CBE) by a health care provider every 1-3 years as part of a regular health exam.  If you are 25 or older, have a CBE every year. Also consider having a breast X-ray (mammogram) every year.  If you have a family history of breast cancer, talk to your health care provider about genetic screening.  If you  are at high risk for breast cancer, talk to your health care provider about having an MRI and a mammogram every year.  Breast cancer gene (BRCA) assessment is recommended for women who have family members with BRCA-related cancers. BRCA-related cancers include:  Breast.  Ovarian.  Tubal.  Peritoneal cancers.  Results of the assessment will determine the need for genetic counseling and BRCA1 and BRCA2 testing. Cervical Cancer Your health care provider may recommend that you be screened regularly for cancer of the pelvic organs (ovaries, uterus, and  vagina). This screening involves a pelvic examination, including checking for microscopic changes to the surface of your cervix (Pap test). You may be encouraged to have this screening done every 3 years, beginning at age 21.  For women ages 30-65, health care providers may recommend pelvic exams and Pap testing every 3 years, or they may recommend the Pap and pelvic exam, combined with testing for human papilloma virus (HPV), every 5 years. Some types of HPV increase your risk of cervical cancer. Testing for HPV may also be done on women of any age with unclear Pap test results.  Other health care providers may not recommend any screening for nonpregnant women who are considered low risk for pelvic cancer and who do not have symptoms. Ask your health care provider if a screening pelvic exam is right for you.  If you have had past treatment for cervical cancer or a condition that could lead to cancer, you need Pap tests and screening for cancer for at least 20 years after your treatment. If Pap tests have been discontinued, your risk factors (such as having a new sexual partner) need to be reassessed to determine if screening should resume. Some women have medical problems that increase the chance of getting cervical cancer. In these cases, your health care provider may recommend more frequent screening and Pap tests. Colorectal Cancer  This type of cancer can be detected and often prevented.  Routine colorectal cancer screening usually begins at 59 years of age and continues through 59 years of age.  Your health care provider may recommend screening at an earlier age if you have risk factors for colon cancer.  Your health care provider may also recommend using home test kits to check for hidden blood in the stool.  A small camera at the end of a tube can be used to examine your colon directly (sigmoidoscopy or colonoscopy). This is done to check for the earliest forms of colorectal  cancer.  Routine screening usually begins at age 50.  Direct examination of the colon should be repeated every 5-10 years through 59 years of age. However, you may need to be screened more often if early forms of precancerous polyps or small growths are found. Skin Cancer  Check your skin from head to toe regularly.  Tell your health care provider about any new moles or changes in moles, especially if there is a change in a mole's shape or color.  Also tell your health care provider if you have a mole that is larger than the size of a pencil eraser.  Always use sunscreen. Apply sunscreen liberally and repeatedly throughout the day.  Protect yourself by wearing long sleeves, pants, a wide-brimmed hat, and sunglasses whenever you are outside. HEART DISEASE, DIABETES, AND HIGH BLOOD PRESSURE   High blood pressure causes heart disease and increases the risk of stroke. High blood pressure is more likely to develop in:  People who have blood pressure in the high end   of the normal range (130-139/85-89 mm Hg).  People who are overweight or obese.  People who are African American.  If you are 38-23 years of age, have your blood pressure checked every 3-5 years. If you are 61 years of age or older, have your blood pressure checked every year. You should have your blood pressure measured twice--once when you are at a hospital or clinic, and once when you are not at a hospital or clinic. Record the average of the two measurements. To check your blood pressure when you are not at a hospital or clinic, you can use:  An automated blood pressure machine at a pharmacy.  A home blood pressure monitor.  If you are between 45 years and 39 years old, ask your health care provider if you should take aspirin to prevent strokes.  Have regular diabetes screenings. This involves taking a blood sample to check your fasting blood sugar level.  If you are at a normal weight and have a low risk for diabetes,  have this test once every three years after 59 years of age.  If you are overweight and have a high risk for diabetes, consider being tested at a younger age or more often. PREVENTING INFECTION  Hepatitis B  If you have a higher risk for hepatitis B, you should be screened for this virus. You are considered at high risk for hepatitis B if:  You were born in a country where hepatitis B is common. Ask your health care provider which countries are considered high risk.  Your parents were born in a high-risk country, and you have not been immunized against hepatitis B (hepatitis B vaccine).  You have HIV or AIDS.  You use needles to inject street drugs.  You live with someone who has hepatitis B.  You have had sex with someone who has hepatitis B.  You get hemodialysis treatment.  You take certain medicines for conditions, including cancer, organ transplantation, and autoimmune conditions. Hepatitis C  Blood testing is recommended for:  Everyone born from [birth year] through 1965.  Anyone with known risk factors for hepatitis C. Sexually transmitted infections (STIs)  You should be screened for sexually transmitted infections (STIs) including gonorrhea and chlamydia if:  You are sexually active and are younger than 59 years of age.  You are older than 59 years of age and your health care provider tells you that you are at risk for this type of infection.  Your sexual activity has changed since you were last screened and you are at an increased risk for chlamydia or gonorrhea. Ask your health care provider if you are at risk.  If you do not have HIV, but are at risk, it may be recommended that you take a prescription medicine daily to prevent HIV infection. This is called pre-exposure prophylaxis (PrEP). You are considered at risk if:  You are sexually active and do not regularly use condoms or know the HIV status of your partner(s).  You take drugs by injection.  You are sexually  active with a partner who has HIV. Talk with your health care provider about whether you are at high risk of being infected with HIV. If you choose to begin PrEP, you should first be tested for HIV. You should then be tested every 3 months for as long as you are taking PrEP.  PREGNANCY   If you are premenopausal and you may become pregnant, ask your health care provider about preconception counseling.  If you may  become pregnant, take 400 to 800 micrograms (mcg) of folic acid every day.  If you want to prevent pregnancy, talk to your health care provider about birth control (contraception). OSTEOPOROSIS AND MENOPAUSE   Osteoporosis is a disease in which the bones lose minerals and strength with aging. This can result in serious bone fractures. Your risk for osteoporosis can be identified using a bone density scan.  If you are 61 years of age or older, or if you are at risk for osteoporosis and fractures, ask your health care provider if you should be screened.  Ask your health care provider whether you should take a calcium or vitamin D supplement to lower your risk for osteoporosis.  Menopause may have certain physical symptoms and risks.  Hormone replacement therapy may reduce some of these symptoms and risks. Talk to your health care provider about whether hormone replacement therapy is right for you.  HOME CARE INSTRUCTIONS   Schedule regular health, dental, and eye exams.  Stay current with your immunizations.   Do not use any tobacco products including cigarettes, chewing tobacco, or electronic cigarettes.  If you are pregnant, do not drink alcohol.  If you are breastfeeding, limit how much and how often you drink alcohol.  Limit alcohol intake to no more than 1 drink per day for nonpregnant women. One drink equals 12 ounces of beer, 5 ounces of wine, or 1 ounces of hard liquor.  Do not use street drugs.  Do not share needles.  Ask your health care provider for help if  you need support or information about quitting drugs.  Tell your health care provider if you often feel depressed.  Tell your health care provider if you have ever been abused or do not feel safe at home.   This information is not intended to replace advice given to you by your health care provider. Make sure you discuss any questions you have with your health care provider.   Document Released: 07/06/2010 Document Revised: 01/11/2014 Document Reviewed: 11/22/2012 Elsevier Interactive Patient Education [employer].

## 2014-12-12 ENCOUNTER — Encounter: Payer: Self-pay | Admitting: Physician Assistant

## 2014-12-12 ENCOUNTER — Telehealth: Payer: Self-pay

## 2014-12-12 DIAGNOSIS — H66001 Acute suppurative otitis media without spontaneous rupture of ear drum, right ear: Secondary | ICD-10-CM

## 2014-12-12 LAB — COMPREHENSIVE METABOLIC PANEL
ALBUMIN: 3.9 g/dL (ref 3.5–5.5)
ALK PHOS: 94 IU/L (ref 39–117)
ALT: 13 IU/L (ref 0–32)
AST: 25 IU/L (ref 0–40)
Albumin/Globulin Ratio: 1.6 (ref 1.1–2.5)
BILIRUBIN TOTAL: 0.4 mg/dL (ref 0.0–1.2)
BUN / CREAT RATIO: 16 (ref 9–23)
BUN: 10 mg/dL (ref 6–24)
CHLORIDE: 97 mmol/L (ref 97–106)
CO2: 23 mmol/L (ref 18–29)
CREATININE: 0.64 mg/dL (ref 0.57–1.00)
Calcium: 9.2 mg/dL (ref 8.7–10.2)
GFR calc non Af Amer: 98 mL/min/{1.73_m2} (ref >59)
GFR, EST AFRICAN AMERICAN: 113 mL/min/{1.73_m2} (ref >59)
GLOBULIN, TOTAL: 2.5 g/dL (ref 1.5–4.5)
Glucose: 106 mg/dL — ABNORMAL HIGH (ref 65–99)
Potassium: 3.4 mmol/L — ABNORMAL LOW (ref 3.5–5.2)
SODIUM: 139 mmol/L (ref 136–144)
TOTAL PROTEIN: 6.4 g/dL (ref 6.0–8.5)

## 2014-12-12 LAB — CBC WITH DIFFERENTIAL/PLATELET
BASOS ABS: 0 10*3/uL (ref 0.0–0.2)
Basos: 0 %
EOS (ABSOLUTE): 0.1 10*3/uL (ref 0.0–0.4)
Eos: 1 %
Hematocrit: 32.5 % — ABNORMAL LOW (ref 34.0–46.6)
Hemoglobin: 11.5 g/dL (ref 11.1–15.9)
IMMATURE GRANS (ABS): 0 10*3/uL (ref 0.0–0.1)
Immature Granulocytes: 0 %
LYMPHS: 10 %
Lymphocytes Absolute: 0.9 10*3/uL (ref 0.7–3.1)
MCH: 34.3 pg — AB (ref 26.6–33.0)
MCHC: 35.4 g/dL (ref 31.5–35.7)
MCV: 97 fL (ref 79–97)
MONOS ABS: 1.3 10*3/uL — AB (ref 0.1–0.9)
Monocytes: 15 %
NEUTROS ABS: 6.3 10*3/uL (ref 1.4–7.0)
NEUTROS PCT: 74 %
Platelets: 402 10*3/uL — ABNORMAL HIGH (ref 150–379)
RBC: 3.35 x10E6/uL — ABNORMAL LOW (ref 3.77–5.28)
RDW: 12 % — ABNORMAL LOW (ref 12.3–15.4)
WBC: 8.5 10*3/uL (ref 3.4–10.8)

## 2014-12-12 LAB — LIPID PANEL
CHOL/HDL RATIO: 3.6 ratio (ref 0.0–4.4)
CHOLESTEROL TOTAL: 166 mg/dL (ref 100–199)
HDL: 46 mg/dL (ref >39)
LDL CALC: 101 mg/dL — AB (ref 0–99)
Triglycerides: 95 mg/dL (ref 0–149)
VLDL CHOLESTEROL CAL: 19 mg/dL (ref 5–40)

## 2014-12-12 LAB — TSH: TSH: 0.514 u[IU]/mL (ref 0.450–4.500)

## 2014-12-12 NOTE — Telephone Encounter (Signed)
-----   Message from Mar Daring, [place] sent at 12/12/2014  9:07 AM EST ----- All labs are within normal limits and stable with exception of potassium which is borderline low.  Try to add potassium rich foods into diet such as bananas.  Blood count is stable from previous.  We will recheck in one year.  Thanks! -JB

## 2014-12-12 NOTE — Telephone Encounter (Deleted)
-----   Message from Mar Daring, [place] sent at 12/12/2014  9:07 AM EST ----- All labs are within normal limits and stable with exception of potassium which is borderline low.  Try to add potassium rich foods into diet such as bananas.  Blood count is stable from previous.  We will recheck in one year.  Thanks! -JB

## 2014-12-12 NOTE — Telephone Encounter (Signed)
Note sent via mychart

## 2014-12-12 NOTE — Telephone Encounter (Addendum)
Patient advised as directed below. Patient states left work because couldn't do it anymore with her left ear pain and the other symptoms. Per patient will let the medicine work and hope to feel better soon.   Patient wants a work note stating that she was seen yesterday with her symptoms and to send it to her through her MyChart MSg  account.Please.  Thanks,  -Deanna Blair

## 2014-12-13 ENCOUNTER — Telehealth: Payer: Self-pay | Admitting: Physician Assistant

## 2014-12-13 MED ORDER — AMOXICILLIN-POT CLAVULANATE 875-125 MG PO TABS: 1.0000 | ORAL_TABLET | Freq: Two times a day (BID) | ORAL | Status: DC

## 2014-12-13 NOTE — Telephone Encounter (Signed)
Pt stated that her ear isn't feeling better and wasn't sure if she should try something else. Nicholas Please advise. Thanks TNP

## 2014-12-13 NOTE — Telephone Encounter (Signed)
Responded to patient via [employer].  Will try augmentin instead of zpak.

## 2014-12-13 NOTE — Addendum Note (Signed)
Addended by: Mar Daring on: 12/13/2014 11:53 AM   Modules accepted: Orders

## 2014-12-13 NOTE — Telephone Encounter (Signed)
Please advise. Thanks.  

## 2014-12-13 NOTE — Telephone Encounter (Signed)
Opened in error

## 2014-12-14 LAB — PAP IG W/ RFLX HPV ASCU: PAP SMEAR COMMENT: 0

## 2014-12-19 ENCOUNTER — Encounter: Payer: Self-pay | Admitting: Physician Assistant

## 2014-12-19 ENCOUNTER — Ambulatory Visit
Admission: RE | Admit: 2014-12-19 | Discharge: 2014-12-19 | Disposition: A | Discharge status: Home / Self Care | Payer: BLUE CROSS/BLUE SHIELD | Source: Ambulatory Visit | Attending: Physician Assistant | Admitting: Physician Assistant

## 2014-12-19 ENCOUNTER — Ambulatory Visit (INDEPENDENT_AMBULATORY_CARE_PROVIDER_SITE_OTHER): Payer: BLUE CROSS/BLUE SHIELD | Admitting: Physician Assistant

## 2014-12-19 VITALS — BP 108/70 | HR 86 | Temp 97.8°F | Resp 15 | Wt 118.6 lb

## 2014-12-19 DIAGNOSIS — R0989 Other specified symptoms and signs involving the circulatory and respiratory systems: Secondary | ICD-10-CM

## 2014-12-19 DIAGNOSIS — R05 Cough: Secondary | ICD-10-CM

## 2014-12-19 DIAGNOSIS — R059 Cough, unspecified: Secondary | ICD-10-CM

## 2014-12-19 DIAGNOSIS — R062 Wheezing: Secondary | ICD-10-CM | POA: Diagnosis not present

## 2014-12-19 MED ORDER — LEVOFLOXACIN 750 MG PO TABS: 750.0000 mg | ORAL_TABLET | Freq: Every day | ORAL | Status: DC

## 2014-12-19 MED ORDER — HYDROCODONE-HOMATROPINE 5-1.5 MG/5ML PO SYRP: 5.0000 mL | ORAL_SOLUTION | Freq: Three times a day (TID) | ORAL | Status: DC | PRN

## 2014-12-19 NOTE — Progress Notes (Signed)
Patient scheduled. For tomorrow at 830

## 2014-12-19 NOTE — Progress Notes (Signed)
Patient: Deanna Blair Female    DOB: [birth year]   59 y.o.   MRN: [id number] Visit Date: 12/19/2014  Today's Provider: Mar Daring, PA-C   Chief Complaint  Patient presents with  . URI    symptoms persist   Subjective:    HPI Cough: Patient is here today concern about her URI symptoms. Patient was here on 12/11/14 for worsening cough over 3 weeks. Patient was prescribed Hycodan and Zpak. On 12/09 patient called about her ear not feeling any better and Augmentin was sent to her pharmacy instead of the [facility]. Now patient is here today because is feeling more fatigue, feels like has shortness of breath, chest tightness, wheezing and having lots of sweat.    No Known Allergies Previous Medications   AMOXICILLIN-CLAVULANATE (AUGMENTIN) 875-125 MG TABLET    Take 1 tablet by mouth 2 (two) times daily.   AZITHROMYCIN (ZITHROMAX) 250 MG TABLET    Take 2 tablets PO on day one, and one tablet PO daily thereafter until completed.   BUSPIRONE (BUSPAR) 5 MG TABLET    Take 1 tablet (5 mg total) by mouth 2 (two) times daily.   HYDROCODONE-HOMATROPINE (HYCODAN) 5-1.5 MG/5ML SYRUP    Take 5 mLs by mouth every 8 (eight) hours as needed for cough.    Review of Systems  Constitutional: Positive for diaphoresis and fatigue (feeling more tired). Negative for fever and chills.  HENT: Positive for congestion, rhinorrhea (a little) and sinus pressure. Negative for ear pain, sneezing, sore throat, tinnitus, trouble swallowing and voice change.   Eyes: Negative.   Respiratory: Positive for cough (started back yesterday. is not constant), chest tightness, shortness of breath and wheezing.   Cardiovascular: Negative.   Gastrointestinal: Negative.   Skin: Positive for rash.  Neurological: Positive for headaches. Negative for dizziness.  All other systems reviewed and are negative.   Social History  Substance Use Topics  . Smoking status: Light Tobacco Smoker -- 0.00 packs/day    Types:  Cigarettes  . Smokeless tobacco: Never Used  . Alcohol Use: 3.0 oz/week    5 Cans of beer per week     Comment: 5 beers weekly   Objective:   BP 108/70 mmHg  Pulse 86  Temp(Src) 97.8 F (36.6 C) (Oral)  Resp 15  Wt 118 lb 9.6 oz (53.797 kg)  SpO2 96%  Physical Exam  Constitutional: She appears well-developed and well-nourished. No distress.  HENT:  Head: Normocephalic and atraumatic.  Right Ear: Hearing, tympanic membrane, external ear and ear canal normal.  Left Ear: Hearing, tympanic membrane, external ear and ear canal normal.  Nose: Nose normal. Right sinus exhibits no maxillary sinus tenderness and no frontal sinus tenderness. Left sinus exhibits no maxillary sinus tenderness and no frontal sinus tenderness.  Mouth/Throat: Uvula is midline, oropharynx is clear and moist and mucous membranes are normal. No oropharyngeal exudate.  Eyes: Conjunctivae are normal. Pupils are equal, round, and reactive to light. Right eye exhibits no discharge. Left eye exhibits no discharge. No scleral icterus.  Neck: Normal range of motion. Neck supple. No tracheal deviation present. No thyromegaly present.  Cardiovascular: Normal rate, regular rhythm and normal heart sounds.  Exam reveals no gallop and no friction rub.   No murmur heard. Pulmonary/Chest: Effort normal. No stridor. No respiratory distress. She has no decreased breath sounds. She has wheezes in the right lower field. She has no rhonchi. She has rales in the right lower field.  Lymphadenopathy:  She has no cervical adenopathy.  Skin: Skin is warm and dry. She is not diaphoretic.  Vitals reviewed.       Assessment & Plan:     1. Rales Worsening lung sounds compared to previous physical exam. She now has rales and expiratory wheezing in the right lower lung fields. There was also some expiratory wheezing heard anterior in the right and left midlung field. The wheezing was heard loudest in the right lower lung field however. I  will change her antibiotic to Levaquin as below to treat for possible pneumonia. I will also order a chest x-ray to rule out pneumonia due to the new abnormal lung sounds and her smoking history. I will follow-up with her pending the results of her chest x-ray. If the chest x-ray is normal I will see her back as needed and she may call the office if she has any worsening symptoms, questions or concerns. If the chest x-ray comes back positive for pneumonia I will see her back in  approximately 10 days with possible repeat chest x-ray to check for clearing. - levofloxacin (LEVAQUIN) 750 MG tablet; Take 1 tablet (750 mg total) by mouth daily.  Dispense: 10 tablet; Refill: 0 - DG Chest 2 View; Future  2. Expiratory wheezing See above medical treatment plan for rales.. - levofloxacin (LEVAQUIN) 750 MG tablet; Take 1 tablet (750 mg total) by mouth daily.  Dispense: 10 tablet; Refill: 0 - DG Chest 2 View; Future  3. Cough See above medical treatment plan.  - HYDROcodone-homatropine (HYCODAN) 5-1.5 MG/5ML syrup; Take 5 mLs by mouth every 8 (eight) hours as needed for cough.  Dispense: 120 mL; Refill: 0 - DG Chest 2 View; Future       Mar Daring, PA-C  [facility]

## 2014-12-19 NOTE — Patient Instructions (Signed)

## 2014-12-20 ENCOUNTER — Ambulatory Visit (INDEPENDENT_AMBULATORY_CARE_PROVIDER_SITE_OTHER): Payer: BLUE CROSS/BLUE SHIELD | Admitting: Physician Assistant

## 2014-12-20 ENCOUNTER — Encounter: Payer: Self-pay | Admitting: Physician Assistant

## 2014-12-20 VITALS — BP 104/68 | HR 80 | Temp 98.6°F | Resp 16 | Wt 119.0 lb

## 2014-12-20 DIAGNOSIS — R5383 Other fatigue: Secondary | ICD-10-CM

## 2014-12-20 DIAGNOSIS — I509 Heart failure, unspecified: Secondary | ICD-10-CM

## 2014-12-20 DIAGNOSIS — Z87891 Personal history of nicotine dependence: Secondary | ICD-10-CM | POA: Diagnosis not present

## 2014-12-20 DIAGNOSIS — R0602 Shortness of breath: Secondary | ICD-10-CM

## 2014-12-20 NOTE — Progress Notes (Signed)
Patient: Deanna Blair    DOB: December 16, 1955   59 y.o.   MRN: [id number] Visit Date: 12/20/2014  Today's Provider: Mar Daring, PA-C   No chief complaint on file.  Subjective:    HPI  Deanna Blair is here today to follow-up on her xray results. Patient was seen in the office yesterday. She had rales and some expiratory wheezing on physical exam and was sent for a CXR which showed pulmonary hypertension, increased cardiomegaly and a left pleural effusion suspicious for pulmonary edema consistent with mild congestive heart failure.  She has never had any heart failure symptoms prior to this onset. Her main complaint has been fatigue with non-productive cough.  She denies any increased SOB (only when coughing) or DOE.  She denies any chest pain, lower extremity edema, orthopnea, PND or weight gain.  She does not have any cardiovascular risk factors with exception of a history of smoking.  She has however recently had URI symptoms and was recently diagnosed with sinusitis and ear infection.     No Known Allergies Previous Medications   BUSPIRONE (BUSPAR) 5 MG TABLET    Take 1 tablet (5 mg total) by mouth 2 (two) times daily.   HYDROCODONE-HOMATROPINE (HYCODAN) 5-1.5 MG/5ML SYRUP    Take 5 mLs by mouth every 8 (eight) hours as needed for cough.   LEVOFLOXACIN (LEVAQUIN) 750 MG TABLET    Take 1 tablet (750 mg total) by mouth daily.    Review of Systems  Constitutional: Positive for fatigue.  HENT: Negative.   Eyes: Negative.   Respiratory: Positive for cough. Negative for chest tightness, shortness of breath and wheezing.   Cardiovascular: Positive for palpitations (Only when coughing or laying down.). Negative for chest pain and leg swelling.  Gastrointestinal: Negative.   Musculoskeletal: Negative.   Neurological: Negative.     Social History  Substance Use Topics  . Smoking status: Light Tobacco Smoker -- 0.00 packs/day    Types: Cigarettes  . Smokeless  tobacco: Never Used  . Alcohol Use: 3.0 oz/week    5 Cans of beer per week     Comment: 5 beers weekly   Objective:   BP 104/68 mmHg  Pulse 80  Temp(Src) 98.6 F (37 C)  Resp 16  Wt 119 lb (53.978 kg)  Physical Exam  Constitutional: She appears well-developed and well-nourished. No distress.  HENT:  Head: Normocephalic and atraumatic.  Right Ear: Hearing, tympanic membrane, external ear and ear canal normal.  Left Ear: Hearing, tympanic membrane, external ear and ear canal normal.  Nose: Nose normal. Right sinus exhibits no maxillary sinus tenderness and no frontal sinus tenderness. Left sinus exhibits no maxillary sinus tenderness and no frontal sinus tenderness.  Mouth/Throat: Uvula is midline, oropharynx is clear and moist and mucous membranes are normal. No oropharyngeal exudate.  Eyes: Conjunctivae are normal. Pupils are equal, round, and reactive to light. Right eye exhibits no discharge. Left eye exhibits no discharge. No scleral icterus.  Neck: Normal range of motion. Neck supple. No tracheal deviation present. No thyromegaly present.  Cardiovascular: Normal rate, regular rhythm and normal heart sounds.  Exam reveals no gallop and no friction rub.   No murmur heard. Pulmonary/Chest: Effort normal. No accessory muscle usage or stridor. No respiratory distress. She has no decreased breath sounds. She has no wheezes. She has no rhonchi. She has rales in the right lower field.  Abdominal: Soft. Bowel sounds are normal. She exhibits no distension and  no mass. There is no hepatosplenomegaly. There is no tenderness. There is no rebound and no guarding.  Musculoskeletal: She exhibits no edema.  Lymphadenopathy:    She has no cervical adenopathy.  Skin: Skin is warm and dry. She is not diaphoretic.  Vitals reviewed.       Assessment & Plan:     1. SOB (shortness of breath) Fatigue and shortness of breath have been her only complaints. The fatigue is more bothersome for her and  the shortness of breath. She states the shortness breath only occurs when she has a coughing spell. Due to the findings of the chest x-ray I will check labs as below. I will follow-up with her pending these lab results. If the BNP is elevated I will refer her to cardiology for echocardiogram for further evaluation of heart failure. I did advise her however in the meantime to continue taking the Levaquin and we may discontinue this once the diagnosis is confirmed for congestive heart failure. I also discussed with her possibly starting furosemide for the pulmonary edema. She would like to hold off on starting this medication until after we receive the lab results. I agreed with this. I will follow-up with her pending these lab results. Follow-up will be set pending if she is sent to cardiology or not. She is to call the office if she has any worsening symptoms, acute issues, questions or concerns in the meantime. - CBC with Differential - B Nat Peptide  2. Other fatigue See above medical treatment plan.  3. Congestive heart failure, unspecified congestive heart failure chronicity, unspecified congestive heart failure type ([place]) Due to findings of possible mild congestive heart failure on x-ray I did bring her back today to discuss this result as well as to get lab work and an EKG. EKG was normal sinus rhythm with low voltage. I will follow-up with her pending the lab work above. Depending on results we will either refer to cardiology for an echocardiogram for further evaluation and start furosemide versus follow-up here in the office. - EKG 12-Lead       Mar Daring, PA-C  [facility]

## 2014-12-20 NOTE — Patient Instructions (Signed)

## 2014-12-21 LAB — CBC WITH DIFFERENTIAL/PLATELET
BASOS ABS: 0 10*3/uL (ref 0.0–0.2)
BASOS: 0 %
EOS (ABSOLUTE): 0.2 10*3/uL (ref 0.0–0.4)
Eos: 2 %
HEMOGLOBIN: 11.1 g/dL (ref 11.1–15.9)
Hematocrit: 32.2 % — ABNORMAL LOW (ref 34.0–46.6)
IMMATURE GRANS (ABS): 0 10*3/uL (ref 0.0–0.1)
Immature Granulocytes: 0 %
LYMPHS ABS: 0.8 10*3/uL (ref 0.7–3.1)
Lymphs: 7 %
MCH: 33.1 pg — AB (ref 26.6–33.0)
MCHC: 34.5 g/dL (ref 31.5–35.7)
MCV: 96 fL (ref 79–97)
MONOCYTES: 8 %
Monocytes Absolute: 0.9 10*3/uL (ref 0.1–0.9)
NEUTROS ABS: 8.8 10*3/uL — AB (ref 1.4–7.0)
Neutrophils: 83 %
Platelets: 803 10*3/uL (ref 150–379)
RBC: 3.35 x10E6/uL — ABNORMAL LOW (ref 3.77–5.28)
RDW: 12.5 % (ref 12.3–15.4)
WBC: 10.6 10*3/uL (ref 3.4–10.8)

## 2014-12-21 LAB — BRAIN NATRIURETIC PEPTIDE: BNP: 107 pg/mL — AB (ref 0.0–100.0)

## 2014-12-27 ENCOUNTER — Encounter: Payer: Self-pay | Admitting: Physician Assistant

## 2014-12-27 ENCOUNTER — Ambulatory Visit (INDEPENDENT_AMBULATORY_CARE_PROVIDER_SITE_OTHER): Payer: BLUE CROSS/BLUE SHIELD | Admitting: Physician Assistant

## 2014-12-27 ENCOUNTER — Telehealth: Payer: Self-pay | Admitting: Emergency Medicine

## 2014-12-27 VITALS — BP 100/60 | HR 85 | Temp 98.5°F | Resp 15 | Wt 116.8 lb

## 2014-12-27 DIAGNOSIS — I509 Heart failure, unspecified: Secondary | ICD-10-CM

## 2014-12-27 MED ORDER — FUROSEMIDE 20 MG PO TABS: 20.0000 mg | ORAL_TABLET | Freq: Every day | ORAL | Status: DC

## 2014-12-27 NOTE — Telephone Encounter (Signed)
Faxed todays office note to fax number provided.

## 2014-12-27 NOTE — Progress Notes (Addendum)
Patient: Deanna Blair Female    DOB: 15-Oct-1955   59 y.o.   MRN: [id number] Visit Date: 12/27/2014  Today's Provider: Mar Daring, PA-C   Chief Complaint  Patient presents with  . Follow-up    SOB, congestive heart failure   Subjective:    HPI EDUARDO WURTH is here today to follow-up on SOB and Congestive Heart Failure. Patient feels more tired. Husband states that she has been having poor appetite. Patient states that when she takes deep breath her right side still hurts. Before it was with the cough but now the cough is less and still having the pain when breating. She still denies any swelling, orthopnea, chest pain, postural nocturnal dyspnea. BNP was slightly elevated at 107. She did have a chest x-ray which revealed pulmonary hypertension and mild congestive heart failure.     No Known Allergies Previous Medications   BUSPIRONE (BUSPAR) 5 MG TABLET    Take 1 tablet (5 mg total) by mouth 2 (two) times daily.   HYDROCODONE-HOMATROPINE (HYCODAN) 5-1.5 MG/5ML SYRUP    Take 5 mLs by mouth every 8 (eight) hours as needed for cough.   LEVOFLOXACIN (LEVAQUIN) 750 MG TABLET    Take 1 tablet (750 mg total) by mouth daily.    Review of Systems  Constitutional: Positive for appetite change and fatigue.  HENT: Negative.   Respiratory: Negative.   Cardiovascular: Negative for chest pain, palpitations and leg swelling.  Gastrointestinal: Negative for nausea, vomiting and abdominal pain.  Neurological: Negative for dizziness, light-headedness and headaches.  All other systems reviewed and are negative.   Social History  Substance Use Topics  . Smoking status: Former Smoker -- 0.00 packs/day    Types: Cigarettes  . Smokeless tobacco: Never Used  . Alcohol Use: 3.0 oz/week    5 Cans of beer per week     Comment: 5 beers weekly   Objective:   BP 100/60 mmHg  Pulse 85  Temp(Src) 98.5 F (36.9 C) (Oral)  Resp 15  Wt 116 lb 12.8 oz (52.98 kg)  Physical  Exam  Constitutional: She appears well-developed and well-nourished. No distress.  HENT:  Head: Normocephalic and atraumatic.  Right Ear: Hearing, tympanic membrane, external ear and ear canal normal.  Left Ear: Hearing, tympanic membrane, external ear and ear canal normal.  Nose: Nose normal.  Mouth/Throat: Uvula is midline, oropharynx is clear and moist and mucous membranes are normal. No oropharyngeal exudate.  Eyes: Conjunctivae are normal. Pupils are equal, round, and reactive to light. Right eye exhibits no discharge. Left eye exhibits no discharge. No scleral icterus.  Neck: Normal range of motion. Neck supple. No JVD present. No tracheal deviation present. No thyromegaly present.  Cardiovascular: Normal rate, regular rhythm and normal heart sounds.  Exam reveals no gallop and no friction rub.   No murmur heard. Pulmonary/Chest: Effort normal. No stridor. No respiratory distress. She has no wheezes. She has no rhonchi. She has rales in the right middle field and the right lower field. She exhibits no tenderness.  Abdominal: Soft. Bowel sounds are normal. She exhibits no distension and no mass. There is no tenderness. There is no rebound and no guarding.  Lymphadenopathy:    She has no cervical adenopathy.  Skin: Skin is warm and dry. She is not diaphoretic.  Vitals reviewed.       Assessment & Plan:     1. Heart failure, unspecified heart failure chronicity, unspecified heart failure type ([place])  Being that she has not responded to other treatments for upper respiratory symptoms I do feel that she does most likely have mild heart failure. We did discuss in the office today that the only way to definitively diagnose this would be to get a echocardiogram. She voiced understanding and agrees for referral to cardiology to obtain echocardiogram and to be evaluated. I will also go ahead and begin treatment with low-dose furosemide as below. I will have her take this pill once daily for 1  week. I will reevaluate her in 1 week's time to see if her lung sounds have improved with the diuresis. If lung sounds have improved we may consider repeating BNP, CBC, BMP (to check potassium) as well as chest x-ray to see if there have been clearance. She voices understanding and agrees to current treatment plan. I did answer all of her and her husband's questions today.She will continue to remain out of work until after I see her back next week with a tentative return date on January 2nd due to increased SOB and fatigue.  She states she cannot walk more than 100 yards without becoming SOB and has to sit down and rest.  I will see her back in 1 week's time. She is to call the office if she has any worsening symptoms in the meantime. - Ambulatory referral to Cardiology - furosemide (LASIX) 20 MG tablet; Take 1 tablet (20 mg total) by mouth daily.  Dispense: 30 tablet; Refill: 0       Mar Daring, PA-C  [facility]

## 2014-12-27 NOTE — Telephone Encounter (Signed)
Pt called back and stated that her insurance company called [employer]) that she needed something from [place] stating her new return to work date and what the plan was as of today and what was done at the [facility] today. She would to be called when this is done.   [place] fax- [phone]

## 2014-12-27 NOTE — Patient Instructions (Signed)
Heart Failure Heart failure is a condition in which the heart has trouble pumping blood. This means your heart does not pump blood efficiently for your body to work well. In some cases of heart failure, fluid may back up into your lungs or you may have swelling (edema) in your lower legs. Heart failure is usually a long-term (chronic) condition. It is important for you to take good care of yourself and follow your health care provider's treatment plan. CAUSES  Some health conditions can cause heart failure. Those health conditions include:  High blood pressure (hypertension). Hypertension causes the heart muscle to work harder than normal. When pressure in the blood vessels is high, the heart needs to pump (contract) with more force in order to circulate blood throughout the body. High blood pressure eventually causes the heart to become stiff and weak.  Coronary artery disease (CAD). CAD is the buildup of cholesterol and fat (plaque) in the arteries of the heart. The blockage in the arteries deprives the heart muscle of oxygen and blood. This can cause chest pain and may lead to a heart attack. High blood pressure can also contribute to CAD.  Heart attack (myocardial infarction). A heart attack occurs when one or more arteries in the heart become blocked. The loss of oxygen damages the muscle tissue of the heart. When this happens, part of the heart muscle dies. The injured tissue does not contract as well and weakens the heart's ability to pump blood.  Abnormal heart valves. When the heart valves do not open and close properly, it can cause heart failure. This makes the heart muscle pump harder to keep the blood flowing.  Heart muscle disease (cardiomyopathy or myocarditis). Heart muscle disease is damage to the heart muscle from a variety of causes. These can include drug or alcohol abuse, infections, or unknown reasons. These can increase the risk of heart failure.  Lung disease. Lung disease  makes the heart work harder because the lungs do not work properly. This can cause a strain on the heart, leading it to fail.  Diabetes. Diabetes increases the risk of heart failure. High blood sugar contributes to high fat (lipid) levels in the blood. Diabetes can also cause slow damage to tiny blood vessels that carry important nutrients to the heart muscle. When the heart does not get enough oxygen and food, it can cause the heart to become weak and stiff. This leads to a heart that does not contract efficiently.  Other conditions can contribute to heart failure. These include abnormal heart rhythms, thyroid problems, and low blood counts (anemia). Certain unhealthy behaviors can increase the risk of heart failure, including:  Being overweight.  Smoking or chewing tobacco.  Eating foods high in fat and cholesterol.  Abusing illicit drugs or alcohol.  Lacking physical activity. SYMPTOMS  Heart failure symptoms may vary and can be hard to detect. Symptoms may include:  Shortness of breath with activity, such as climbing stairs.  Persistent cough.  Swelling of the feet, ankles, legs, or abdomen.  Unexplained weight gain.  Difficulty breathing when lying flat (orthopnea).  Waking from sleep because of the need to sit up and get more air.  Rapid heartbeat.  Fatigue and loss of energy.  Feeling light-headed, dizzy, or close to fainting.  Loss of appetite.  Nausea.  Increased urination during the night (nocturia). DIAGNOSIS  A diagnosis of heart failure is based on your history, symptoms, physical examination, and diagnostic tests. Diagnostic tests for heart failure may include:  Echocardiography. °· Electrocardiography. °· Chest X-ray. °· Blood tests. °· Exercise stress test. °· Cardiac angiography. °· Radionuclide scans. °TREATMENT  °Treatment is aimed at managing the symptoms of heart failure. Medicines, behavioral changes, or surgical intervention may be necessary to  treat heart failure. °· Medicines to help treat heart failure may include: °¨ Angiotensin-converting enzyme (ACE) inhibitors. This type of medicine blocks the effects of a blood protein called angiotensin-converting enzyme. ACE inhibitors relax (dilate) the blood vessels and help lower blood pressure. °¨ Angiotensin receptor blockers (ARBs). This type of medicine blocks the actions of a blood protein called angiotensin. Angiotensin receptor blockers dilate the blood vessels and help lower blood pressure. °¨ Water pills (diuretics). Diuretics cause the kidneys to remove salt and water from the blood. The extra fluid is removed through urination. This loss of extra fluid lowers the volume of blood the heart pumps. °¨ Beta blockers. These prevent the heart from beating too fast and improve heart muscle strength. °¨ Digitalis. This increases the force of the heartbeat. °· Healthy behavior changes include: °¨ Obtaining and maintaining a healthy weight. °¨ Stopping smoking or chewing tobacco. °¨ Eating heart-healthy foods. °¨ Limiting or avoiding alcohol. °¨ Stopping illicit drug use. °¨ Physical activity as directed by your health care provider. °· Surgical treatment for heart failure may include: °¨ A procedure to open blocked arteries, repair damaged heart valves, or remove damaged heart muscle tissue. °¨ A pacemaker to improve heart muscle function and control certain abnormal heart rhythms. °¨ An internal cardioverter defibrillator to treat certain serious abnormal heart rhythms. °¨ A left ventricular assist device (LVAD) to assist the pumping ability of the heart. °HOME CARE INSTRUCTIONS  °· Take medicines only as directed by your health care provider. Medicines are important in reducing the workload of your heart, slowing the progression of heart failure, and improving your symptoms. °¨ Do not stop taking your medicine unless directed by your health care provider. °¨ Do not skip any dose of medicine. °¨ Refill your  prescriptions before you run out of medicine. Your medicines are needed every day. °· Engage in moderate physical activity if directed by your health care provider. Moderate physical activity can benefit some people. The elderly and people with severe heart failure should consult with a health care provider for physical activity recommendations. °· Eat heart-healthy foods. Food choices should be free of trans fat and low in saturated fat, cholesterol, and salt (sodium). Healthy choices include fresh or frozen fruits and vegetables, fish, lean meats, legumes, fat-free or low-fat dairy products, and whole grain or high fiber foods. Talk to a dietitian to learn more about heart-healthy foods. °· Limit sodium if directed by your health care provider. Sodium restriction may reduce symptoms of heart failure in some people. Talk to a dietitian to learn more about heart-healthy seasonings. °· Use healthy cooking methods. Healthy cooking methods include roasting, grilling, broiling, baking, poaching, steaming, or stir-frying. Talk to a dietitian to learn more about healthy cooking methods. °· Limit fluids if directed by your health care provider. Fluid restriction may reduce symptoms of heart failure in some people. °· Weigh yourself every day. Daily weights are important in the early recognition of excess fluid. You should weigh yourself every morning after you urinate and before you eat breakfast. Wear the same amount of clothing each time you weigh yourself. Record your daily weight. Provide your health care provider with your weight record. °· Monitor and record your blood pressure if directed by your health care   provider.  Check your pulse if directed by your health care provider.  Lose weight if directed by your health care provider. Weight loss may reduce symptoms of heart failure in some people.  Stop smoking or chewing tobacco. Nicotine makes your heart work harder by causing your blood vessels to constrict.  Do not use nicotine gum or patches before talking to your health care provider.  Keep all follow-up visits as directed by your health care provider. This is important.  Limit alcohol intake to no more than 1 drink per day for nonpregnant women and 2 drinks per day for men. One drink equals 12 ounces of beer, 5 ounces of wine, or 1 ounces of hard liquor. Drinking more than that is harmful to your heart. Tell your health care provider if you drink alcohol several times a week. Talk with your health care provider about whether alcohol is safe for you. If your heart has already been damaged by alcohol or you have severe heart failure, drinking alcohol should be stopped completely.  Stop illicit drug use.  Stay up-to-date with immunizations. It is especially important to prevent respiratory infections through current pneumococcal and influenza immunizations.  Manage other health conditions such as hypertension, diabetes, thyroid disease, or abnormal heart rhythms as directed by your health care provider.  Learn to manage stress.  Plan rest periods when fatigued.  Learn strategies to manage high temperatures. If the weather is extremely hot:  Avoid vigorous physical activity.  Use air conditioning or fans or seek a cooler location.  Avoid caffeine and alcohol.  Wear loose-fitting, lightweight, and light-colored clothing.  Learn strategies to manage cold temperatures. If the weather is extremely cold:  Avoid vigorous physical activity.  Layer clothes.  Wear mittens or gloves, a hat, and a scarf when going outside.  Avoid alcohol.  Obtain ongoing education and support as needed.  Participate in or seek rehabilitation as needed to maintain or improve independence and quality of life. SEEK MEDICAL CARE IF:   You have a rapid weight gain.  You have increasing shortness of breath that is unusual for you.  You are unable to participate in your usual physical activities.  You tire  easily.  You cough more than normal, especially with physical activity.  You have any or more swelling in areas such as your hands, feet, ankles, or abdomen.  You are unable to sleep because it is hard to breathe.  You feel like your heart is beating fast (palpitations).  You become dizzy or light-headed upon standing up. SEEK IMMEDIATE MEDICAL CARE IF:   You have difficulty breathing.  There is a change in mental status such as decreased alertness or difficulty with concentration.  You have a pain or discomfort in your chest.  You have an episode of fainting (syncope). MAKE SURE YOU:   Understand these instructions.  Will watch your condition.  Will get help right away if you are not doing well or get worse.   This information is not intended to replace advice given to you by your health care provider. Make sure you discuss any questions you have with your health care provider.   Document Released: 12/21/2004 Document Revised: 05/07/2014 Document Reviewed: 01/21/2012 Elsevier Interactive Patient Education 2016 Else[employer]  Furosemide tablets What is this medicine? FUROSEMIDE (fyoor [place] se mide) is a diuretic. It helps you make more urine and to lose salt and excess water from your body. This medicine is used to treat high blood pressure, and edema or  swelling from heart, kidney, or liver disease. This medicine may be used for other purposes; ask your health care provider or pharmacist if you have questions. What should I tell my health care provider before I take this medicine? They need to know if you have any of these conditions: -abnormal blood electrolytes -diarrhea or vomiting -gout -heart disease -kidney disease, small amounts of urine, or difficulty passing urine -liver disease -thyroid disease -an unusual or allergic reaction to furosemide, sulfa drugs, other medicines, foods, dyes, or preservatives -pregnant or trying to get pregnant -breast-feeding How  should I use this medicine? Take this medicine by mouth with a glass of water. Follow the directions on the prescription label. You may take this medicine with or without food. If it upsets your stomach, take it with food or milk. Do not take your medicine more often than directed. Remember that you will need to pass more urine after taking this medicine. Do not take your medicine at a time of day that will cause you problems. Do not take at bedtime. Talk to your pediatrician regarding the use of this medicine in children. While this drug may be prescribed for selected conditions, precautions do apply. Overdosage: If you think you have taken too much of this medicine contact a poison control center or emergency room at once. NOTE: This medicine is only for you. Do not share this medicine with others. What if I miss a dose? If you miss a dose, take it as soon as you can. If it is almost time for your next dose, take only that dose. Do not take double or extra doses. What may interact with this medicine? -aspirin and aspirin-like medicines -certain antibiotics -chloral hydrate -cisplatin -cyclosporine -digoxin -diuretics -laxatives -lithium -medicines for blood pressure -medicines that relax muscles for surgery -methotrexate -NSAIDs, medicines for pain and inflammation like ibuprofen, naproxen, or indomethacin -phenytoin -steroid medicines like prednisone or cortisone -sucralfate -thyroid hormones This list may not describe all possible interactions. Give your health care provider a list of all the medicines, herbs, non-prescription drugs, or dietary supplements you use. Also tell them if you smoke, drink alcohol, or use illegal drugs. Some items may interact with your medicine. What should I watch for while using this medicine? Visit your doctor or health care professional for regular checks on your progress. Check your blood pressure regularly. Ask your doctor or health care professional  what your blood pressure should be, and when you should contact him or her. If you are a diabetic, check your blood sugar as directed. You may need to be on a special diet while taking this medicine. Check with your doctor. Also, ask how many glasses of fluid you need to drink a day. You must not get dehydrated. You may get drowsy or dizzy. Do not drive, use machinery, or do anything that needs mental alertness until you know how this drug affects you. Do not stand or sit up quickly, especially if you are an older patient. This reduces the risk of dizzy or fainting spells. Alcohol can make you more drowsy and dizzy. Avoid alcoholic drinks. This medicine can make you more sensitive to the sun. Keep out of the sun. If you cannot avoid being in the sun, wear protective clothing and use sunscreen. Do not use sun lamps or tanning beds/booths. What side effects may I notice from receiving this medicine? Side effects that you should report to your doctor or health care professional as soon as possible: -blood in urine or  stools -dry mouth -fever or chills -hearing loss or ringing in the ears -irregular heartbeat -muscle pain or weakness, cramps -skin rash -stomach upset, pain, or nausea -tingling or numbness in the hands or feet -unusually weak or tired -vomiting or diarrhea -yellowing of the eyes or skin Side effects that usually do not require medical attention (report to your doctor or health care professional if they continue or are bothersome): -headache -loss of appetite -unusual bleeding or bruising This list may not describe all possible side effects. Call your doctor for medical advice about side effects. You may report side effects to FDA at 1-800-FDA-1088. Where should I keep my medicine? Keep out of the reach of children. Store at room temperature between 15 and 30 degrees C (59 and 86 degrees F). Protect from light. Throw away any unused medicine after the expiration date. NOTE: This  sheet is a summary. It may not cover all possible information. If you have questions about this medicine, talk to your doctor, pharmacist, or health care provider.    2016, Elsevier/Gold Standard. (2014-03-13 13:49:50)

## 2014-12-31 NOTE — Telephone Encounter (Signed)
Pt informed and voiced understanding

## 2015-01-02 DIAGNOSIS — I509 Heart failure, unspecified: Secondary | ICD-10-CM | POA: Insufficient documentation

## 2015-01-02 DIAGNOSIS — R0602 Shortness of breath: Secondary | ICD-10-CM | POA: Insufficient documentation

## 2015-01-03 ENCOUNTER — Ambulatory Visit
Admission: RE | Admit: 2015-01-03 | Discharge: 2015-01-03 | Disposition: A | Discharge status: Home / Self Care | Payer: BLUE CROSS/BLUE SHIELD | Source: Ambulatory Visit | Attending: Physician Assistant | Admitting: Physician Assistant

## 2015-01-03 ENCOUNTER — Encounter: Payer: Self-pay | Admitting: Physician Assistant

## 2015-01-03 ENCOUNTER — Ambulatory Visit (INDEPENDENT_AMBULATORY_CARE_PROVIDER_SITE_OTHER): Payer: BLUE CROSS/BLUE SHIELD | Admitting: Physician Assistant

## 2015-01-03 VITALS — BP 100/62 | HR 66 | Temp 97.8°F | Resp 12 | Wt 116.0 lb

## 2015-01-03 DIAGNOSIS — R0989 Other specified symptoms and signs involving the circulatory and respiratory systems: Secondary | ICD-10-CM

## 2015-01-03 DIAGNOSIS — J449 Chronic obstructive pulmonary disease, unspecified: Secondary | ICD-10-CM | POA: Diagnosis not present

## 2015-01-03 DIAGNOSIS — I509 Heart failure, unspecified: Secondary | ICD-10-CM

## 2015-01-03 DIAGNOSIS — J811 Chronic pulmonary edema: Secondary | ICD-10-CM | POA: Insufficient documentation

## 2015-01-03 DIAGNOSIS — I517 Cardiomegaly: Secondary | ICD-10-CM | POA: Insufficient documentation

## 2015-01-03 DIAGNOSIS — E782 Mixed hyperlipidemia: Secondary | ICD-10-CM | POA: Insufficient documentation

## 2015-01-03 NOTE — Patient Instructions (Signed)

## 2015-01-03 NOTE — Progress Notes (Signed)
Patient ID: Deanna Blair, female   DOB: 01-25-1955, 59 y.o.   MRN: [id number]       Patient: Deanna Blair Female    DOB: [birth year]   59 y.o.   MRN: [id number] Visit Date: 01/03/2015  Today's Provider: Mar Daring, PA-C   Chief Complaint  Patient presents with  . Congestive Heart Failure   Subjective:    HPI  Patient is here for congestive heart failure follow up. Last offce visit was on December 23rd and at that time Lasix 20 mg 1 tablet daily was added. She states her SOB and fatigue has improved some. When she lays down she does mention she feels fluid on the right upper side of her chest. Cough is present at time but she states not much. She has appointment with Dr. Nehemiah Massed today at 10 am to discuss echo and evaluation overall by him.    No Known Allergies Previous Medications   BUSPIRONE (BUSPAR) 5 MG TABLET    Take 1 tablet (5 mg total) by mouth 2 (two) times daily.   FUROSEMIDE (LASIX) 20 MG TABLET    Take 1 tablet (20 mg total) by mouth daily.    Review of Systems  Constitutional: Positive for fatigue.  HENT: Negative.   Respiratory: Positive for cough and shortness of breath.   Cardiovascular: Negative.   Gastrointestinal: Negative.   Musculoskeletal: Negative.   Neurological: Negative.     Social History  Substance Use Topics  . Smoking status: Former Smoker -- 0.00 packs/day    Types: Cigarettes  . Smokeless tobacco: Never Used  . Alcohol Use: 3.0 oz/week    5 Cans of beer per week     Comment: 5 beers weekly   Objective:   BP 100/62 mmHg  Pulse 66  Temp(Src) 97.8 F (36.6 C)  Resp 12  Wt 116 lb (52.617 kg)  Physical Exam  Constitutional: She appears well-developed and well-nourished. No distress.  Neck: Normal range of motion. Neck supple. No JVD present. No tracheal deviation present. No thyromegaly present.  Cardiovascular: Normal rate, regular rhythm and normal heart sounds.  Exam reveals no gallop and no friction rub.   No  murmur heard. Pulmonary/Chest: Effort normal. No accessory muscle usage. No respiratory distress. She has no decreased breath sounds. She has no wheezes. She has no rhonchi. She has rales in the right middle field and the right lower field.  Lymphadenopathy:    She has no cervical adenopathy.  Skin: She is not diaphoretic.  Vitals reviewed.       Assessment & Plan:     1. Chest rales She does continue to have mild chest Rales in the right mid and right lower lung field. I will order a chest x-ray to evaluate to see if she is having improvement with the current treatment well of Lasix 20 mg. - DG Chest 2 View; Future  2. Congestive heart failure, unspecified congestive heart failure chronicity, unspecified congestive heart failure type ([place]) I will check labs as below as well as repeat her chest x-ray to see if she has had any improvement on Lasix. She does have an appointment with Dr. Nehemiah Massed today at 10 AM. I will follow-up with her in one week to see how she is doing and to see what Dr. Nehemiah Massed had to say. I will continue to keep her out of work at this time so that she may continue to work on her endurance since she is improving. She is to call  the office if she has any worsening symptoms prior to her follow-up next week. - CBC With Differential - Comprehensive Metabolic Panel (CMET) - B Nat Peptide       Mar Daring, PA-C  [facility]

## 2015-01-04 ENCOUNTER — Encounter: Payer: Self-pay | Admitting: Physician Assistant

## 2015-01-04 LAB — CBC WITH DIFFERENTIAL
BASOS ABS: 0 10*3/uL (ref 0.0–0.2)
Basos: 1 %
EOS (ABSOLUTE): 0.2 10*3/uL (ref 0.0–0.4)
Eos: 4 %
HEMOGLOBIN: 11.4 g/dL (ref 11.1–15.9)
Hematocrit: 34.1 % (ref 34.0–46.6)
IMMATURE GRANS (ABS): 0 10*3/uL (ref 0.0–0.1)
IMMATURE GRANULOCYTES: 0 %
LYMPHS: 17 %
Lymphocytes Absolute: 1 10*3/uL (ref 0.7–3.1)
MCH: 31.5 pg (ref 26.6–33.0)
MCHC: 33.4 g/dL (ref 31.5–35.7)
MCV: 94 fL (ref 79–97)
MONOCYTES: 8 %
Monocytes Absolute: 0.5 10*3/uL (ref 0.1–0.9)
Neutrophils Absolute: 4.3 10*3/uL (ref 1.4–7.0)
Neutrophils: 70 %
RBC: 3.62 x10E6/uL — AB (ref 3.77–5.28)
RDW: 12.6 % (ref 12.3–15.4)
WBC: 6 10*3/uL (ref 3.4–10.8)

## 2015-01-04 LAB — COMPREHENSIVE METABOLIC PANEL
A/G RATIO: 1.4 (ref 1.1–2.5)
ALBUMIN: 3.8 g/dL (ref 3.5–5.5)
ALT: 15 IU/L (ref 0–32)
AST: 26 IU/L (ref 0–40)
Alkaline Phosphatase: 97 IU/L (ref 39–117)
BUN/Creatinine Ratio: 15 (ref 9–23)
BUN: 10 mg/dL (ref 6–24)
CALCIUM: 9.3 mg/dL (ref 8.7–10.2)
CHLORIDE: 98 mmol/L (ref 96–106)
CO2: 25 mmol/L (ref 18–29)
Creatinine, Ser: 0.65 mg/dL (ref 0.57–1.00)
GFR calc non Af Amer: 97 mL/min/{1.73_m2} (ref >59)
GFR, EST AFRICAN AMERICAN: 112 mL/min/{1.73_m2} (ref >59)
GLOBULIN, TOTAL: 2.7 g/dL (ref 1.5–4.5)
Glucose: 92 mg/dL (ref 65–99)
POTASSIUM: 4.4 mmol/L (ref 3.5–5.2)
SODIUM: 139 mmol/L (ref 134–144)
TOTAL PROTEIN: 6.5 g/dL (ref 6.0–8.5)

## 2015-01-04 LAB — BRAIN NATRIURETIC PEPTIDE: BNP: 61.3 pg/mL (ref 0.0–100.0)

## 2015-01-07 ENCOUNTER — Encounter: Payer: Self-pay | Admitting: Physician Assistant

## 2015-01-07 ENCOUNTER — Telehealth: Payer: Self-pay

## 2015-01-07 NOTE — Telephone Encounter (Signed)
Patient advised as directed below.  Thanks,  -Deanna Blair 

## 2015-01-07 NOTE — Telephone Encounter (Signed)
-----   Message from Mar Daring, PA-C sent at 01/07/2015  9:17 AM EST ----- All labs have improved and are within normal limits.

## 2015-01-09 ENCOUNTER — Telehealth: Payer: Self-pay | Admitting: Physician Assistant

## 2015-01-09 NOTE — Telephone Encounter (Signed)
Pt would like a call back to make sure we received forms from [employer] for her to return to work on Monday 01/13/15. Thanks TNP

## 2015-01-09 NOTE — Telephone Encounter (Signed)
Tawanna Sat, have you seen forms? Please advise. Thanks!

## 2015-01-09 NOTE — Telephone Encounter (Signed)
Yes I do have then and they will be ready and sent tomorrow morning.

## 2015-01-10 ENCOUNTER — Encounter: Payer: Self-pay | Admitting: Physician Assistant

## 2015-01-10 ENCOUNTER — Ambulatory Visit (INDEPENDENT_AMBULATORY_CARE_PROVIDER_SITE_OTHER): Payer: BLUE CROSS/BLUE SHIELD | Admitting: Physician Assistant

## 2015-01-10 VITALS — BP 112/62 | HR 67 | Temp 98.2°F | Resp 16 | Wt 118.0 lb

## 2015-01-10 DIAGNOSIS — I509 Heart failure, unspecified: Secondary | ICD-10-CM

## 2015-01-10 NOTE — Progress Notes (Signed)
       Patient: Deanna Blair Female    DOB: 10/19/1955   59 y.o.   MRN: [id number] Visit Date: 01/10/2015  Today's Provider: Mar Daring, PA-C   Chief Complaint  Patient presents with  . Follow-up    Congestive Heart Failure   Subjective:    HPI 1. Congestive heart failure:Patient is here for congestive heart failure follow up. Last offce visit was on December 30 and is taking Lasix 20 mg 1 tablet daily. Patient states no SOB or feeling fatigue. Still feels fluid on the right side of her lung. No leg swelling. Patient still has a cough on and off. She did see Dr. Nehemiah Massed last week. She had an echocardiogram to evaluate for heart failure and was told that her heart was in good condition.     No Known Allergies Previous Medications   BUSPIRONE (BUSPAR) 5 MG TABLET    Take 1 tablet (5 mg total) by mouth 2 (two) times daily.   FUROSEMIDE (LASIX) 20 MG TABLET    Take 1 tablet (20 mg total) by mouth daily.    Review of Systems  Constitutional: Negative.   HENT: Negative.   Eyes: Negative.   Respiratory: Negative.  Negative for chest tightness, shortness of breath and wheezing.   Cardiovascular: Negative for chest pain, palpitations and leg swelling.  Gastrointestinal: Negative.   Neurological: Negative for dizziness, numbness and headaches.    Social History  Substance Use Topics  . Smoking status: Former Smoker -- 0.00 packs/day    Types: Cigarettes  . Smokeless tobacco: Never Used  . Alcohol Use: 3.0 oz/week    5 Cans of beer per week     Comment: 5 beers weekly   Objective:   BP 112/62 mmHg  Pulse 67  Temp(Src) 98.2 F (36.8 C) (Oral)  Resp 16  Wt 118 lb (53.524 kg)  Physical Exam  Constitutional: She appears well-developed and well-nourished. No distress.  Neck: Normal range of motion. Neck supple. No JVD present. No tracheal deviation present. No thyromegaly present.  Cardiovascular: Normal rate, regular rhythm and normal heart sounds.  Exam  reveals no gallop and no friction rub.   No murmur heard. Pulmonary/Chest: Effort normal and breath sounds normal. No respiratory distress. She has no wheezes. She has no rales.  Musculoskeletal: She exhibits no edema.  Lymphadenopathy:    She has no cervical adenopathy.  Skin: She is not diaphoretic.  Vitals reviewed.       Assessment & Plan:     1. Congestive heart failure, unspecified congestive heart failure chronicity, unspecified congestive heart failure type ([place]) Much improved. Normal exam today.  May return to work on 01/13/15.  She is to call the office in the meantime if she has any worsening symptoms.       Mar Daring, PA-C  [facility]

## 2015-01-18 ENCOUNTER — Emergency Department: Payer: BLUE CROSS/BLUE SHIELD

## 2015-01-18 ENCOUNTER — Encounter: Payer: Self-pay | Admitting: Emergency Medicine

## 2015-01-18 ENCOUNTER — Emergency Department
Admission: EM | Admit: 2015-01-18 | Discharge: 2015-01-18 | Disposition: A | Discharge status: Home / Self Care | Payer: BLUE CROSS/BLUE SHIELD | Attending: Emergency Medicine | Admitting: Emergency Medicine

## 2015-01-18 DIAGNOSIS — R079 Chest pain, unspecified: Secondary | ICD-10-CM | POA: Diagnosis present

## 2015-01-18 DIAGNOSIS — I509 Heart failure, unspecified: Secondary | ICD-10-CM | POA: Insufficient documentation

## 2015-01-18 DIAGNOSIS — M549 Dorsalgia, unspecified: Secondary | ICD-10-CM | POA: Diagnosis not present

## 2015-01-18 DIAGNOSIS — Z87891 Personal history of nicotine dependence: Secondary | ICD-10-CM | POA: Diagnosis not present

## 2015-01-18 DIAGNOSIS — R0600 Dyspnea, unspecified: Secondary | ICD-10-CM | POA: Diagnosis not present

## 2015-01-18 DIAGNOSIS — R918 Other nonspecific abnormal finding of lung field: Secondary | ICD-10-CM | POA: Diagnosis not present

## 2015-01-18 DIAGNOSIS — R091 Pleurisy: Secondary | ICD-10-CM | POA: Insufficient documentation

## 2015-01-18 LAB — TROPONIN I: Troponin I: <0.03 ng/mL (ref <0.031)

## 2015-01-18 LAB — BASIC METABOLIC PANEL
ANION GAP: 13 (ref 5–15)
BUN: 14 mg/dL (ref 6–20)
CO2: 22 mmol/L (ref 22–32)
Calcium: 9.6 mg/dL (ref 8.9–10.3)
Chloride: 105 mmol/L (ref 101–111)
Creatinine, Ser: 0.65 mg/dL (ref 0.44–1.00)
GLUCOSE: 121 mg/dL — AB (ref 65–99)
POTASSIUM: 3.6 mmol/L (ref 3.5–5.1)
Sodium: 140 mmol/L (ref 135–145)

## 2015-01-18 LAB — CBC
HCT: 37 % (ref 35.0–47.0)
HEMOGLOBIN: 12.5 g/dL (ref 12.0–16.0)
MCH: 32.4 pg (ref 26.0–34.0)
MCHC: 33.8 g/dL (ref 32.0–36.0)
MCV: 95.8 fL (ref 80.0–100.0)
Platelets: 241 10*3/uL (ref 150–440)
RBC: 3.86 MIL/uL (ref 3.80–5.20)
RDW: 14.6 % — ABNORMAL HIGH (ref 11.5–14.5)
WBC: 9.5 10*3/uL (ref 3.6–11.0)

## 2015-01-18 MED ORDER — DOCUSATE SODIUM 100 MG PO CAPS: 100.0000 mg | ORAL_CAPSULE | Freq: Every day | ORAL | Status: DC | PRN

## 2015-01-18 MED ORDER — ONDANSETRON HCL 4 MG/2ML IJ SOLN
4.0000 mg | Freq: Once | INTRAMUSCULAR | Status: AC
  Administered 2015-01-18: 4 mg via INTRAVENOUS
  Filled 2015-01-18: qty 2

## 2015-01-18 MED ORDER — IOHEXOL 350 MG/ML SOLN
80.0000 mL | Freq: Once | INTRAVENOUS | Status: AC | PRN
  Administered 2015-01-18: 80 mL via INTRAVENOUS

## 2015-01-18 MED ORDER — MORPHINE SULFATE (PF) 4 MG/ML IV SOLN
4.0000 mg | Freq: Once | INTRAVENOUS | Status: AC
  Administered 2015-01-18: 4 mg via INTRAVENOUS
  Filled 2015-01-18: qty 1

## 2015-01-18 MED ORDER — ONDANSETRON HCL 4 MG/2ML IJ SOLN
INTRAMUSCULAR | Status: AC
  Administered 2015-01-18: 4 mg via INTRAVENOUS
  Filled 2015-01-18: qty 2

## 2015-01-18 MED ORDER — LEVOFLOXACIN 500 MG PO TABS: 500.0000 mg | ORAL_TABLET | Freq: Every day | ORAL | Status: AC

## 2015-01-18 MED ORDER — CLINDAMYCIN HCL 300 MG PO CAPS: 300.0000 mg | ORAL_CAPSULE | Freq: Three times a day (TID) | ORAL | Status: DC

## 2015-01-18 MED ORDER — MORPHINE SULFATE (PF) 4 MG/ML IV SOLN
INTRAVENOUS | Status: AC
  Administered 2015-01-18: 4 mg via INTRAVENOUS
  Filled 2015-01-18: qty 1

## 2015-01-18 MED ORDER — OXYCODONE-ACETAMINOPHEN 7.5-325 MG PO TABS: 1.0000 | ORAL_TABLET | ORAL | Status: DC | PRN

## 2015-01-18 NOTE — ED Notes (Signed)
Patient transported to CT 

## 2015-01-18 NOTE — ED Provider Notes (Signed)
[facility] Emergency Department Provider Note     Time seen: ----------------------------------------- 9:25 AM on 01/18/2015 -----------------------------------------    I have reviewed the triage vital signs and the nursing notes.   HISTORY  Chief Complaint Chest Pain    HPI Deanna Blair is a 60 y.o. female who presents ER with sharp right-sided chest pain that is worse with deep breath or movement. Reportedly she was recently treated by her doctor for congestive heart failure but she had an echocardiogram that was normal. She is currently taking 21 g Lasix a day because she had some edema recently, was also treated for pneumonia in the past. She denies fevers but has had chills, does have chest pain and shortness of breath when she takes a deep breath on the right side. Pain is much worse when she breathes deeper stretches.   Past Medical History  Diagnosis Date  . Depression with anxiety     Well controlled with Wellbutrin and Buspar    Patient Active Problem List   Diagnosis Date Noted  . 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 Cardiac enlargement 01/03/2015  . Congestive heart failure ([place]) 01/02/2015  . History of tobacco abuse 12/20/2014  . History of cervical cancer 12/11/2014  . Tobacco abuse counseling 12/26/2012    Past Surgical History  Procedure Laterality Date  . Ectopic pregnancy surgery  1988    Allergies Review of patient's allergies indicates no known allergies.  Social History Social History  Substance Use Topics  . Smoking status: Former Smoker -- 0.00 packs/day    Types: Cigarettes  . Smokeless tobacco: Never Used  . Alcohol Use: 3.0 oz/week    5 Cans of beer per week     Comment: 5 beers weekly    Review of Systems Constitutional: Negative for fever. Eyes: Negative for visual changes. ENT: Negative for sore throat. Cardiovascular: Positive for chest pain Respiratory: Positive  for difficulty breathing Gastrointestinal: Negative for abdominal pain, vomiting and diarrhea. Genitourinary: Negative for dysuria. Musculoskeletal: Positive for back pain Skin: Negative for rash. Neurological: Negative for headaches, focal weakness or numbness.  10-point ROS otherwise negative.  ____________________________________________   PHYSICAL EXAM:  VITAL SIGNS: ED Triage Vitals  Enc Vitals Group     BP --      Pulse --      Resp 01/18/15 0919 20     Temp --      Temp Source 01/18/15 0919 Oral     SpO2 --      Weight 01/18/15 0919 116 lb (52.617 kg)     Height 01/18/15 0919 '5\' 2"'$  (1.575 m)     Head Cir --      Peak Flow --      Pain Score 01/18/15 0919 0     Pain Loc --      Pain Edu? --      Excl. in [place]? --     Constitutional: Alert and oriented. Well appearing and in no distress. Eyes: Conjunctivae are normal. PERRL. Normal extraocular movements. ENT   Head: Normocephalic and atraumatic.   Nose: No congestion/rhinnorhea.   Mouth/Throat: Mucous membranes are moist.   Neck: No stridor. Cardiovascular: Normal rate, regular rhythm. Normal and symmetric distal pulses are present in all extremities. No murmurs, rubs, or gallops. Respiratory: Normal respiratory effort without tachypnea nor retractions. Rales are present in the right base only Gastrointestinal: Soft and nontender. No distention. No abdominal bruits.  Musculoskeletal: Nontender with normal range of motion  in all extremities. No joint effusions.  No lower extremity tenderness nor edema. Neurologic:  Normal speech and language. No gross focal neurologic deficits are appreciated. Speech is normal. No gait instability. Skin:  Skin is warm, dry and intact. No rash noted. Psychiatric: Mood and affect are normal. Speech and behavior are normal. Patient exhibits appropriate insight and judgment. ____________________________________________  EKG: Interpreted by me. Normal sinus rhythm with rate  83 bpm, normal PR interval, normal QRS, long QT interval. Normal axis.  ____________________________________________  ED COURSE:  Pertinent labs & imaging results that were available during my care of the patient were reviewed by me and considered in my medical decision making (see chart for details). Patient is in no acute distress but does have an abnormal lung sounds and a suspicious story for PE. Patient will likely need CT angiogram ____________________________________________    LABS (pertinent positives/negatives)  Labs Reviewed  BASIC METABOLIC PANEL - Abnormal; Notable for the following:    Glucose, Bld 121 (*)    All other components within normal limits  CBC - Abnormal; Notable for the following:    RDW 14.6 (*)    All other components within normal limits  TROPONIN I    RADIOLOGY Images were viewed by me  Chest x-ray, CT angiogram  IMPRESSION: 1. Negative for acute PE or thoracic aortic dissection. 2. Multifocal consolidative and sub solid lesions in the right lung as detailed above. The largest has been present and has enlarged significantly since 11/13/2013 suggesting neoplasm more likely than infectious/inflammatory process. Further evaluation recommended. 3. Right hilar and mediastinal adenopathy as above. 4. Small pericardial effusion, new since prior study. ____________________________________________  FINAL ASSESSMENT AND PLAN  Pleuritic pain, likely lung cancer  Plan: Patient with labs and imaging as dictated above. Patient pleuritic pain due to the location of one of these lesions on CT. She'll be given Levaquin and clindamycin to cover her for infection and abscess including anaerobic bacteria. I have discussed the case with Dr. Yancey Flemings from pulmonology. Patient will be followed up early next week for further evaluation.   Earleen Newport, MD   Earleen Newport, MD 01/18/15 [phone]

## 2015-01-18 NOTE — ED Notes (Signed)
Patient transported to X-ray 

## 2015-01-18 NOTE — ED Notes (Signed)
Pt to ed with c/o right sided sharp chest pain that occurs when she coughs or takes a  deep breath.  Pt denies sob, denies radiation of pain, denies weakness, denies n/v.

## 2015-01-18 NOTE — Discharge Instructions (Signed)
Pleurisy Pleurisy is an inflammation and swelling of the lining of the lungs (pleura). Because of this inflammation, it hurts to breathe. It can be aggravated by coughing, laughing, or deep breathing. Pleurisy is often caused by an underlying infection or disease.  HOME CARE INSTRUCTIONS  Monitor your pleurisy for any changes. The following actions may help to alleviate any discomfort you are experiencing:  Medicine may help with pain. Only take over-the-counter or prescription medicines for pain, discomfort, or fever as directed by your health care provider.  Only take antibiotic medicine as directed. Make sure to finish it even if you start to feel better. SEEK MEDICAL CARE IF:   Your pain is not controlled with medicine or is increasing.  You have an increase in pus-like (purulent) secretions brought up with coughing. SEEK IMMEDIATE MEDICAL CARE IF:   You have blue or dark lips, fingernails, or toenails.  You are coughing up blood.  You have increased difficulty breathing.  You have continuing pain unrelieved by medicine or pain lasting more than 1 week.  You have pain that radiates into your neck, arms, or jaw.  You develop increased shortness of breath or wheezing.  You develop a fever, rash, vomiting, fainting, or other serious symptoms. MAKE SURE YOU:  Understand these instructions.   Will watch your condition.   Will get help right away if you are not doing well or get worse.    This information is not intended to replace advice given to you by your health care provider. Make sure you discuss any questions you have with your health care provider.   Document Released: 12/21/2004 Document Revised: 08/23/2012 Document Reviewed: 06/04/2012 Elsevier Interactive Patient Education 2016 Elsevier Inc.  Pulmonary Nodule A pulmonary nodule is a small, round growth of tissue in the lung. Pulmonary nodules can range in size from less than 1/5 inch (4 mm) to a little bigger  than an inch (25 mm). Most pulmonary nodules are detected when imaging tests of the lung are being performed for a different problem. Pulmonary nodules are usually not cancerous (benign). However, some pulmonary nodules are cancerous (malignant). Follow-up treatment or testing is based on the size of the pulmonary nodule and your risk of getting lung cancer.  CAUSES Benign pulmonary nodules can be caused by various things. Some of the causes include:   Bacterial, fungal, or viral infections. This is usually an old infection that is no longer active, but it can sometimes be a current, active infection.  A benign mass of tissue.  Inflammation from conditions such as rheumatoid arthritis.   Abnormal blood vessels in the lungs. Malignant pulmonary nodules can result from lung cancer or from cancers that spread to the lung from other places in the body. SIGNS AND SYMPTOMS Pulmonary nodules usually do not cause symptoms. DIAGNOSIS Most often, pulmonary nodules are found incidentally when an X-ray or CT scan is performed to look for some other problem in the lung area. To help determine whether a pulmonary nodule is benign or malignant, your health care provider will take a medical history and order a variety of tests. Tests done may include:   Blood tests.  A skin test called a tuberculin test. This test is used to determine if you have been exposed to the germ that causes tuberculosis.   Chest X-rays. If possible, a new X-ray may be compared with X-rays you have had in the past.   CT scan. This test shows smaller pulmonary nodules more clearly than an X-ray.  Positron emission tomography (PET) scan. In this test, a safe amount of a radioactive substance is injected into the bloodstream. Then, the scan takes a picture of the pulmonary nodule. The radioactive substance is eliminated from your body in your urine.   Biopsy. A tiny piece of the pulmonary nodule is removed so it can be checked  under a microscope. TREATMENT  Pulmonary nodules that are benign normally do not require any treatment because they usually do not cause symptoms or breathing problems. Your health care provider may want to monitor the pulmonary nodule through follow-up CT scans. The frequency of these CT scans will vary based on the size of the nodule and the risk factors for lung cancer. For example, CT scans will need to be done more frequently if the pulmonary nodule is larger and if you have a history of smoking and a family history of cancer. Further testing or biopsies may be done if any follow-up CT scan shows that the size of the pulmonary nodule has increased. HOME CARE INSTRUCTIONS  Only take over-the-counter or prescription medicines as directed by your health care provider.  Keep all follow-up appointments with your health care provider. SEEK MEDICAL CARE IF:  You have trouble breathing when you are active.   You feel sick or unusually tired.   You do not feel like eating.   You lose weight without trying to.   You develop chills or night sweats.  SEEK IMMEDIATE MEDICAL CARE IF:  You cannot catch your breath, or you begin wheezing.   You cannot stop coughing.   You cough up blood.   You become dizzy or feel like you are going to pass out.   You have sudden chest pain.   You have a fever or persistent symptoms for more than 2-3 days.   You have a fever and your symptoms suddenly get worse. MAKE SURE YOU:  Understand these instructions.  Will watch your condition.  Will get help right away if you are not doing well or get worse.   This information is not intended to replace advice given to you by your health care provider. Make sure you discuss any questions you have with your health care provider.   Document Released: 10/18/2008 Document Revised: 08/23/2012 Document Reviewed: 06/12/2012 Elsevier Interactive Patient Education [employer].

## 2015-01-20 ENCOUNTER — Telehealth: Payer: Self-pay | Admitting: Physician Assistant

## 2015-01-20 NOTE — Telephone Encounter (Signed)
Pt stated that insurance didn't receive the paper work that [place] completed on 01/09/15. Please refax the forms that were scanned in on 01/10/15. Pt would like a call when it has been re-faxed. Thanks TNP

## 2015-01-20 NOTE — Telephone Encounter (Signed)
I re-faxed paper contact pt, pt was unavailable to come to phone I spoke with Herbie Baltimore pt's husband to let her know that I re-faxed paper.  Thanks CC

## 2015-01-27 ENCOUNTER — Other Ambulatory Visit: Payer: Self-pay | Admitting: Internal Medicine

## 2015-01-27 DIAGNOSIS — J181 Lobar pneumonia, unspecified organism: Secondary | ICD-10-CM

## 2015-01-31 ENCOUNTER — Ambulatory Visit
Admission: RE | Admit: 2015-01-31 | Discharge: 2015-01-31 | Disposition: A | Discharge status: Home / Self Care | Payer: BLUE CROSS/BLUE SHIELD | Source: Ambulatory Visit | Attending: Internal Medicine | Admitting: Internal Medicine

## 2015-01-31 DIAGNOSIS — J181 Lobar pneumonia, unspecified organism: Secondary | ICD-10-CM

## 2015-01-31 DIAGNOSIS — I313 Pericardial effusion (noninflammatory): Secondary | ICD-10-CM | POA: Diagnosis not present

## 2015-01-31 DIAGNOSIS — R918 Other nonspecific abnormal finding of lung field: Secondary | ICD-10-CM | POA: Insufficient documentation

## 2015-01-31 DIAGNOSIS — R590 Localized enlarged lymph nodes: Secondary | ICD-10-CM | POA: Insufficient documentation

## 2015-01-31 MED ORDER — IOHEXOL 300 MG/ML  SOLN
75.0000 mL | Freq: Once | INTRAMUSCULAR | Status: AC | PRN
  Administered 2015-01-31: 75 mL via INTRAVENOUS

## 2015-02-06 ENCOUNTER — Inpatient Hospital Stay: Payer: BLUE CROSS/BLUE SHIELD | Attending: Oncology | Admitting: Oncology

## 2015-02-06 ENCOUNTER — Inpatient Hospital Stay: Payer: BLUE CROSS/BLUE SHIELD | Admitting: Internal Medicine

## 2015-02-06 VITALS — BP 120/74 | HR 101 | Temp 97.7°F | Resp 18 | Wt 116.2 lb

## 2015-02-06 DIAGNOSIS — F418 Other specified anxiety disorders: Secondary | ICD-10-CM

## 2015-02-06 DIAGNOSIS — R590 Localized enlarged lymph nodes: Secondary | ICD-10-CM | POA: Insufficient documentation

## 2015-02-06 DIAGNOSIS — Z87891 Personal history of nicotine dependence: Secondary | ICD-10-CM | POA: Insufficient documentation

## 2015-02-06 DIAGNOSIS — C3431 Malignant neoplasm of lower lobe, right bronchus or lung: Secondary | ICD-10-CM | POA: Diagnosis not present

## 2015-02-06 DIAGNOSIS — R918 Other nonspecific abnormal finding of lung field: Secondary | ICD-10-CM

## 2015-02-06 DIAGNOSIS — C779 Secondary and unspecified malignant neoplasm of lymph node, unspecified: Secondary | ICD-10-CM | POA: Diagnosis not present

## 2015-02-06 DIAGNOSIS — R053 Chronic cough: Secondary | ICD-10-CM

## 2015-02-06 DIAGNOSIS — Z8701 Personal history of pneumonia (recurrent): Secondary | ICD-10-CM

## 2015-02-06 DIAGNOSIS — R05 Cough: Secondary | ICD-10-CM

## 2015-02-06 DIAGNOSIS — N323 Diverticulum of bladder: Secondary | ICD-10-CM | POA: Insufficient documentation

## 2015-02-06 DIAGNOSIS — I313 Pericardial effusion (noninflammatory): Secondary | ICD-10-CM

## 2015-02-06 DIAGNOSIS — Z808 Family history of malignant neoplasm of other organs or systems: Secondary | ICD-10-CM | POA: Insufficient documentation

## 2015-02-06 NOTE — Progress Notes (Signed)
Patient here as new evaluation for lung mass.  Referred by Dr. Humphrey Rolls.

## 2015-02-07 ENCOUNTER — Encounter: Payer: Self-pay | Admitting: *Deleted

## 2015-02-07 ENCOUNTER — Inpatient Hospital Stay: Admission: RE | Admit: 2015-02-07 | Payer: BLUE CROSS/BLUE SHIELD | Source: Ambulatory Visit

## 2015-02-07 ENCOUNTER — Telehealth: Payer: Self-pay | Admitting: Internal Medicine

## 2015-02-07 NOTE — Telephone Encounter (Signed)
Called and spoke with patient and advised pt to contact [facility] since her provider directory had Dr. Mortimer Fries as an out of network provider.  Pt called [employer] [place] and was advised that Dr. Mortimer Fries was in network with [facility].  Pt stated that this call was documented in her record at [facility] [place] and gave pt Ref # [id number]. Nothing else needed. Rhonda J Cobb

## 2015-02-07 NOTE — Pre-Procedure Instructions (Signed)
2017 2:45 PM EST Formatting of this note may be different from the original. Established Patient Visit   Chief Complaint: Chief Complaint  Patient presents with  . Follow-up  Echo Today  . Shortness of Breath  Date of Service: 01/08/2015 Date of Birth: October 22, 1955 PCP: Mechele Collin  History of Present Illness: Deanna Blair is a 60 y.o.female patient  Shortness of breath The patient presents with acute shortness of breath improving over the last 4 weeks which occurs with mild exertion and limits ADLs associated with any activity and relived by nothing in particular and lasting intermittent (> 20 minutes). Other related symptoms include lower extremity edema. The differential diagnosis includes lung disease and/or sleep apnea  Stress Test Regular Stress was performed showing Normal test.  Results for orders placed or performed in visit on 01/08/15  Echocardiogram 2D complete  Result Value Ref Range  LV Ejection Fraction (%) 55  Aortic Valve Stenosis Grade none  Aortic Valve Regurgitation Grade trivial  Mitral Valve Stenosis Grade none  Mitral Valve Regurgitation Grade trivial  Tricuspid Valve Regurgitation Grade mild  Tricuspid Valve Regurgitation Max Velocity (m/s) 2.3 m/sec  Right Ventricle Systolic Pressure (mmHg) 09.9 mmHg  LV End Diastolic Diameter (cm) 4.4 cm  LV End Systolic Diameter (cm) 3.0 cm  LV Septum Wall Thickness (cm) 0.7 cm  LV Posterior Wall Thickness (cm) 0.8 cm  Left Atrium Diameter (cm) 3.1 cm  Narrative  INTERNAL MEDICINE DEPARTMENT Deanna Blair, Deanna Blair [id number] [facility] Acct #: [id number] [address] [name], [place] [address] Date: 01/08/2015 02:12 PM Adult Female Age: 60 yrs ECHOCARDIOGRAM REPORT Outpatient STUDY:CHEST WALL TAPE:0000:00: 0:00:00 KC::KCWI ECHO:Yes DOPPLER:Yes FILE:[id number] MD1: KOWALSKI, BRUCE JAY COLOR:Yes CONTRAST:No MACHINE:Philips Height: 62 in RV BIOPSY:No 3D:No SOUND QLTY:Moderate  Weight: 114 lb MEDIUM:None BSA: 1.5 m2 ___________________________________________________________________________________________ HISTORY:DOE REASON:Assess, LV function INDICATION:Enlarged heart [I51.7 (ICD-10-CM)];, SOB (shortness of breath) [R06.02 (ICD-10-CM)]  ___________________________________________________________________________________________ ECHOCARDIOGRAPHIC MEASUREMENTS 2D DIMENSIONS AORTA Values Normal Range MAIN PA Values Normal Range Annulus: nm* [2.1 - 2.5] PA Main: nm* [1.5 - 2.1] Aorta Sin: nm* [2.7 - 3.3] RIGHT VENTRICLE ST Junction: nm* [2.3 - 2.9] RV Base: nm* [ < 4.2] Asc.Aorta: nm* [2.3 - 3.1] RV Mid: nm* [ < 3.5] LEFT VENTRICLE RV Length: 4.4 cm [ < 8.6] LVIDd: 4.4 cm [3.9 - 5.3] INFERIOR VENA CAVA LVIDs: 3.0 cm Max. IVC: nm* [ <= 2.1] FS: 31.8 % [> 25] Min. IVC: nm* SWT: 0.70 cm [0.5 - 0.9] ------------------ PWT: 0.80 cm [0.5 - 0.9] nm* - not measured LEFT ATRIUM Deanna Diam: 3.1 cm [2.7 - 3.8] Deanna A4C Area: nm* [ < 20] Deanna Volume: nm* [22 - 52]  ___________________________________________________________________________________________ ECHOCARDIOGRAPHIC DESCRIPTIONS  AORTIC ROOT Size:Normal Dissection:INDETERM FOR DISSECTION  AORTIC VALVE Leaflets:Tricuspid Morphology:Normal Mobility:Fully mobile  LEFT VENTRICLE Size:Normal Anterior:Normal Contraction:Normal Lateral:Normal Closest EF:>55% (Estimated) Septal:Normal LV Masses:No Masses Apical:Normal HAL:PFXT Inferior:Normal Posterior:Normal Dias.FxClass:(Grade 2) relaxation abnormal, pseudonormal  MITRAL VALVE Leaflets:Normal Mobility:Fully mobile Morphology:THICKENED LEAFLET(S)  LEFT ATRIUM Size:Normal Deanna Masses:No masses IA Septum:Normal IAS  MAIN PA Size:Normal  PULMONIC VALVE Morphology:Normal Mobility:Fully mobile  RIGHT VENTRICLE RV Masses:No Masses Size:Normal Free Wall:Normal Contraction:Normal  TRICUSPID VALVE Leaflets:Normal Mobility:Fully  mobile Morphology:Normal  RIGHT ATRIUM Size:Normal RA Other:None RA Mass:No masses  PERICARDIUM Fluid:No effusion  INFERIOR VENACAVA Size:Normal Normal respiratory collapse   ____________________________________________________________________ DOPPLER ECHO and OTHER SPECIAL PROCEDURES Aortic:TRIVIAL AR No AS  Mitral:TRIVIAL MR No MS MV Inflow E Vel=57.1 cm/sec MV Annulus E'Vel=5.5 cm/sec E/E'Ratio=10.5  Tricuspid:MILD TR No TS 227.0 cm/sec  peak TR vel 25.6 mmHg peak RV pressure  Pulmonary:MILD PR No PS     ___________________________________________________________________________________________ INTERPRETATION NORMAL LEFT VENTRICULAR SYSTOLIC FUNCTION NORMAL RIGHT VENTRICULAR SYSTOLIC FUNCTION MILD VALVULAR REGURGITATION (See above) NO VALVULAR STENOSIS   ___________________________________________________________________________________________ Electronically signed by: MD Serafina Royals on 01/08/2015 03:46 PM Performed By: Scherrie November, RCS Ordering Physician: Flossie Dibble  ___________________________________________________________________________________________   Past Medical and Surgical History  Past Medical History History reviewed. No pertinent past medical history.  Past Surgical History She has no past surgical history on file.   Medications and Allergies  Current Medications  Current Outpatient Prescriptions  Medication Sig Dispense Refill  . busPIRone (BUSPAR) 5 MG tablet Take by mouth.  . FUROsemide (LASIX) 20 MG tablet Take by mouth.   No current facility-administered medications for this visit.   Allergies: Review of patient's allergies indicates no known allergies.  Social and Family History  Social History reports that she has quit smoking. Her smoking use included Cigarettes. She quit after 20.00 years of use. She has never used smokeless tobacco. She reports that she drinks alcohol. She reports that she does not use  illicit drugs.  Family History Family History  Problem Relation Age of Onset  . Heart disease Mother  . Hypertension Father   Review of Systems   Review of Systems  Positive for sob Negative for weight gain weight loss, weakness, vision change, hearing loss, cough, congestion, PND, orthopnea, heartburn, nausea, diaphoresis, vomiting, diarrhea, bloody stool, melena, stomach pain, extremity pain, leg weakness, leg cramping, leg blood clots, headache, blackouts, nosebleed, mouth pain, urinary frequency, urination at night, muscle weakness, skin lesions, skin rashes, tingling ,ulcers, numbness, anxiety, and/or depression Physical Examination   Vitals: Visit Vitals  . BP 120/80 (BP Location: Right upper arm, Patient Position: Sitting, BP Cuff Size: Adult)  . Pulse 66  . Resp 15  . Ht 157.5 cm ('5\' 2"'$ )  . Wt 52.9 kg (116 lb 9.6 oz)  . SpO2 94%  . BMI 21.33 kg/m2   Ht:157.5 cm ('5\' 2"'$ ) Wt:52.9 kg (116 lb 9.6 oz) WER:XVQM surface area is 1.52 meters squared. Body mass index is 21.33 kg/(m^2). Appearance: well appearing in no acute distress HEENT: Pupils equally reactive to light and accomodation, no xanthalasma  Neck: Supple, no apparent thyromegaly, masses, or lymphadenopathy  Lungs: normal respiratory effort; few right crackles, no rhonchi, no wheezes Heart: Regular rate and rhythm. Normal S1 S2 No gallops, murmur, no rub, PMI is normal size and placement. carotid upstroke normal without bruit. Jugular venous pressure is normal Abdomen: soft, nontender, not distended with normal bowel sounds. No apparent hepatosplenomegally. Abdominal aorta is normal size without bruit Extremities: no edema, no ulcers, no clubbing, no cyanosis Peripheral Pulses: 2+ in upper extremities, 2+ femoral pulses bilaterally, 2+lower extremity  Musculoskeletal; Normal muscle tone without kyphosis Neurological: Cranial nerves intact, Oriented and Alert  Assessment   60 y.o. female with  Encounter Diagnosis   Name Primary?  . SOB (shortness of breath) Yes   Plan  -No further intervention shortness of breath reported above multifactorial in nature including age, decreased exercise tolerance, disability, and/or medication management. The patient is to follow for any worsening symptoms or increase in severity for further in need in investigation or treatment options.  -No further intervention of valve disease multifactorial in nature and currently reasonably stable.   No orders of the defined types were placed in this encounter.  Return if symptoms worsen or fail to improve.  Flossie Dibble, MD  Plan of Treatment - as of this encounter Not on file  Visit Diagnoses - in this encounter Diagnosis  SOB (shortness of breath) - Primary  Shortness of breath   Document Information Primary Care Provider [place] (Dec. 30, 2016 - Present) [phone] (Work) [phone] (Fax)  [facility] [address] [place], [place] [address] Document Coverage Dates Jan. 04, 2017 - Jan. 04, 2017 [place] [phone] (Work) [place], [place] [address] Encounter Providers Bruce Lenn Sink (Attending) [phone] (Work) [phone] (Fax)  [facility] [place], [place] [address]

## 2015-02-07 NOTE — Patient Instructions (Signed)
  Your procedure is scheduled on: 02-10-15 Report to [facility] @ 6 AM PER PT  Remember: Instructions that are not followed completely may result in serious medical risk, up to and including death, or upon the discretion of your surgeon and anesthesiologist your surgery may need to be rescheduled.    _X___ 1. Do not eat food or drink liquids after midnight. No gum chewing or hard candies.     _X___ 2. No Alcohol for 24 hours before or after surgery.   ____ 3. Bring all medications with you on the day of surgery if instructed.    ____ 4. Notify your doctor if there is any change in your medical condition     (cold, fever, infections).     Do not wear jewelry, make-up, hairpins, clips or nail polish.  Do not wear lotions, powders, or perfumes. You may wear deodorant.  Do not shave 48 hours prior to surgery. Men may shave face and neck.  Do not bring valuables to the hospital.    [facility] is not responsible for any belongings or valuables.               Contacts, dentures or bridgework may not be worn into surgery.  Leave your suitcase in the car. After surgery it may be brought to your room.  For patients admitted to the hospital, discharge time is determined by your  treatment team.   Patients discharged the day of surgery will not be allowed to drive home.   Please read over the following fact sheets that you were given:     ____ Take these medicines the morning of surgery with A SIP OF WATER:    1. NONE  2.   3.   4.  5.  6.  ____ Fleet Enema (as directed)   ____ Use CHG Soap as directed  ____ Use inhalers on the day of surgery  ____ Stop metformin 2 days prior to surgery    ____ Take 1/2 of usual insulin dose the night before surgery and none on the morning of surgery.   ____ Stop Coumadin/Plavix/aspirin-N/A  ____ Stop Anti-inflammatories-NO NSAIDS OR ASA PRODUCTS-TYLENOL OK TO TAKE   ____ Stop supplements until after surgery.     ____ Bring C-Pap to the hospital.

## 2015-02-08 ENCOUNTER — Encounter: Payer: Self-pay | Admitting: Oncology

## 2015-02-08 NOTE — Progress Notes (Signed)
[place] @ [facility] Telephone:([phone]) [phone]  Fax:(336) [phone]   INITIAL CONSULT  Peja Allender OB: [birth year]  MR#: [id number]  YSA#:[id number]  Patient Care Team: Mar Daring, PA-C as PCP - General (Family Medicine) Allyne Gee, MD as Referring Physician (Internal Medicine)  CHIEF COMPLAINT:  Chief Complaint  Patient presents with  . New Evaluation  1.  Patient had an abnormal CT scan with progressing right lower lobe infiltrate  VISIT DIAGNOSIS:  Abnormal CT scan of chest  No history exists.      INTERVAL HISTORY:  60 year old gentleman lady who had a previous history of smoking for last 30 years patient quit smoking approximately a few years ago.  Member of 2015 patient presented to primary physician with cough was told that she has pneumonia was treated with antibiotic.  Had an abnormal CT scan with right lower lobe infiltrate.  In December 2015 patient started having sharp chest pain went to emergency room but a repeat CT scan revealed progressing right lower lobe pulmonary infiltrate.  Patient was seen by pulmonologists Dr. Chancy Milroy and after few rounds of antibiotic a repeat CT scan revealed progressing disease.  Patient was referred to me for further evaluation and treatment consideration extremly anxious and apprehensive lady. Has  a dry hacking cough.\No hemoptysis.  No chest pain.  REVIEW OF SYSTEMS:   GENERAL:  Feels good.  Active.  No fevers, sweats or weight loss. PERFORMANCE STATUS (ECOG):  0 HEENT:  No visual changes, runny nose, sore throat, mouth sores or tenderness. Lungs: Increasing cough and shortness of breath Cardiac:  No chest pain, palpitations, orthopnea, or PND. GI:  No nausea, vomiting, diarrhea, constipation, melena or hematochezia. GU:  No urgency, frequency, dysuria, or hematuria. Musculoskeletal:  No back pain.  No joint pain.  No muscle tenderness. Extremities:  No pain or swelling. Skin:  No rashes or skin changes. Neuro:  No  headache, numbness or weakness, balance or coordination issues. Endocrine:  No diabetes, thyroid issues, hot flashes or night sweats. Psych:  No mood changes, depression he had patient's's family is slightly apprehensive and anxious Pain:  No focal pain. Review of systems:  All other systems reviewed and found to be negative.  As per HPI. Otherwise, a complete review of systems is negatve.  PAST MEDICAL HISTORY: Past Medical History  Diagnosis Date  . Depression with anxiety     Well controlled with Wellbutrin and Buspar  . Pneumonia   . Anxiety   . Depression     PAST SURGICAL HISTORY: Past Surgical History  Procedure Laterality Date  . Ectopic pregnancy surgery  1988    FAMILY HISTORY Family History  Problem Relation Age of Onset  . Cancer Father     Prostate Cancer  . Hypertension Father   . Stroke Father   . Hypertension Brother     GYNECOLOGIC HISTORY:  No LMP recorded. Patient is postmenopausal.     ADVANCED DIRECTIVES:  Patient does not have any living will or healthcare power of attorney.  Information was given .  Available resources had been discussed.  We will follow-up on subsequent appointments regarding this issue  HEALTH MAINTENANCE: Social History  Substance Use Topics  . Smoking status: Former Smoker -- 0.25 packs/day    Types: Cigarettes    Quit date: 02/06/2005  . Smokeless tobacco: Never Used  . Alcohol Use: 3.0 oz/week    5 Cans of beer per week     Comment: 5 beers weekly  Colonoscopy:  PAP:  Bone density:  Lipid panel:  No Known Allergies  No current outpatient prescriptions on file.   No current facility-administered medications for this visit.    OBJECTIVE: PHYSICAL EXAM  General  status: Performance status is good.  Patient has not lost significant weight. Since last evaluation there is no significant change in the general status HEENT: No evidence of stomatitis. Sclera and conjunctivae :: No jaundice.   pale  looking. Lungs: Air  entry equal on both sides.  Coarse crepitation in the right lower lobe.  Cardiac: Heart sounds are normal.  No pericardial rub.  No murmur. Lymphatic system: Cervical, axillary, inguinal, lymph nodes not palpable GI: Abdomen is soft.liver and spleen not palpable.  No ascites.  Bowel sounds are normal.  No other palpable masses.  No tenderness . Lower extremity: No edema Neurological system: Higher functions, cranial nerves intact no evidence of peripheral neuropathy. Skin: No rash.  No ecchymosis.. No petechial hemorrhages  Filed Vitals:   02/06/15 1333  BP: 120/74  Pulse: 101  Temp: 97.7 F (36.5 C)  Resp: 18     Body mass index is 21.24 kg/(m^2).    ECOG FS:0 - Asymptomatic  LAB RESULTS:  No visits with results within 5 Day(s) from this visit. Latest known visit with results is:  Admission on 01/18/2015, Discharged on 01/18/2015  Component Date Value Ref Range Status  . Sodium 01/18/2015 140  135 - 145 mmol/L Final  . Potassium 01/18/2015 3.6  3.5 - 5.1 mmol/L Final  . Chloride 01/18/2015 105  101 - 111 mmol/L Final  . CO2 01/18/2015 22  22 - 32 mmol/L Final  . Glucose, Bld 01/18/2015 121* 65 - 99 mg/dL Final  . BUN 01/18/2015 14  6 - 20 mg/dL Final  . Creatinine, Ser 01/18/2015 0.65  0.44 - 1.00 mg/dL Final  . Calcium 01/18/2015 9.6  8.9 - 10.3 mg/dL Final  . GFR calc non Af Amer 01/18/2015 >60  >60 mL/min Final  . GFR calc Af Amer 01/18/2015 >60  >60 mL/min Final   Comment: (NOTE) The eGFR has been calculated using the CKD EPI equation. This calculation has not been validated in all clinical situations. eGFR's persistently <60 mL/min signify possible Chronic Kidney Disease.   . Anion gap 01/18/2015 13  5 - 15 Final  . Troponin I 01/18/2015 <0.03  <0.031 ng/mL Final   Comment:        NO INDICATION OF MYOCARDIAL INJURY.   . WBC 01/18/2015 9.5  3.6 - 11.0 K/uL Final  . RBC 01/18/2015 3.86  3.80 - 5.20 MIL/uL Final  . Hemoglobin 01/18/2015 12.5   12.0 - 16.0 g/dL Final  . HCT 01/18/2015 37.0  35.0 - 47.0 % Final  . MCV 01/18/2015 95.8  80.0 - 100.0 fL Final  . MCH 01/18/2015 32.4  26.0 - 34.0 pg Final  . MCHC 01/18/2015 33.8  32.0 - 36.0 g/dL Final  . RDW 01/18/2015 14.6* 11.5 - 14.5 % Final  . Platelets 01/18/2015 241  150 - 440 K/uL Final     STUDIES: Dg Chest 2 View  01/18/2015  CLINICAL DATA:  Patient states diagnosed with pneumonia in December. Complains of sharp right-sided chest pain with coughing or deep inspiration. EXAM: CHEST  2 VIEW COMPARISON:  01/03/2015, 12/19/2014, 11/12/2013 as well as chest CT 11/13/2013. FINDINGS: Lungs are hyperinflated with mild interval worsening right hilar/ perihilar opacification and mixed interstitial airspace density over the right mid to lower lung. No evidence of effusion  or pneumothorax. Cardiomediastinal silhouette and remainder of the exam is unchanged. IMPRESSION: Worsening right hilar/perihilar opacification with slight worsening mixed interstitial airspace density over the right mid to lower lung. Recommend chest CT with contrast for further evaluation to compare with previous chest CT as findings may be due to worsening infection/ atypical infection or underlying malignancy. Electronically Signed   By: Marin Olp M.D.   On: 01/18/2015 10:05   Ct Chest W Contrast  01/31/2015  CLINICAL DATA:  Follow-up of pneumonia. EXAM: CT CHEST WITH CONTRAST TECHNIQUE: Multidetector CT imaging of the chest was performed during intravenous contrast administration. CONTRAST:  65m OMNIPAQUE IOHEXOL 300 MG/ML  SOLN COMPARISON:  01/18/2015 FINDINGS: Mediastinum/Lymph Nodes: The heart is normal in size. There is persistent pericardial thickening versus effusion, stable in size. There are unchanged mediastinal lymph nodes, with the largest left AP window lymph node measuring 10 mm in short axis. The remainder of the bilateral prevascular, right peritracheal and precarinal lymph nodes are less than 1 cm in  short axis. Lungs/Pleura: There is persistent solid and sub solid posterior subpleural mass with extensive cystic degeneration in the superior segment of the right upper lobe measuring 3.8 by 6.2 by 6.3 cm. It abuts the posterior and medial pleura. Extensive surrounding ground-glass opacities are seen. There is a 6 mm posterior subpleural solid satellite nodule slightly more inferiorly in the right lower lobe. Additional areas of interstitial thickening and ground-glass opacities are seen in the basilar segment of right lower lobe, measuring approximately 5 cm, superior segment of the right upper lobe, right middle lobe, and posterior segment of the right lower lobe, where there are 3 lesions measuring up to 4 cm. There is subpleural right apical scarring. There is no evidence of pleura effusion. The left lung is clear.  The airway is patent. Upper abdomen: No acute findings. Musculoskeletal: No chest wall mass or suspicious bone lesions identified. IMPRESSION: Persistent solids and sub solid superior segment of right lower lobe mass, highly suspicious for slow growing primary lung malignancy such as bronchoalveolar carcinoma. Alternatively, but less likely, this may represent an ongoing fungal infection. Areas of interstitial thickening and ground-glass opacities within the right lower lobe, right middle lobe, and posterior segment of the right upper lobe are highly suspicious for satellite sites of malignancy. These could alternatively represent areas of interstitial hemorrhage. Borderline mediastinal lymphadenopathy. Persistent pericardial effusion or thickening.  Query metastatic. Oncology consult is recommended. These results will be called to the ordering clinician or representative by the Radiologist Assistant, and communication documented in the PACS or zVision Dashboard. Electronically Signed   By: DFidela SalisburyM.D.   On: 01/31/2015 15:49   Ct Angio Chest Pe W/cm &/or Wo Cm  01/18/2015  CLINICAL  DATA:  Pt to ed with c/o right sided sharp chest pain that occurs when she coughs or takes a deep breath. Pt denies sob, denies radiation of pain, denies weakness, denies n/v. EXAM: CT ANGIOGRAPHY CHEST WITH CONTRAST TECHNIQUE: Multidetector CT imaging of the chest was performed using the standard protocol during bolus administration of intravenous contrast. Multiplanar CT image reconstructions and MIPs were obtained to evaluate the vascular anatomy. CONTRAST:  877mOMNIPAQUE IOHEXOL 350 MG/ML SOLN COMPARISON:  11/13/2013 FINDINGS: Right arm IV contrast injection. The SVC is patent. RV/LV ratio less than 1. Satisfactory opacification of pulmonary arteries noted, and there is no evidence of pulmonary emboli. Patent pulmonary veins bilaterally. Adequate contrast opacification of the thoracic aorta with no evidence of dissection, aneurysm, or stenosis. There  is classic 3-vessel brachiocephalic arch anatomy without proximal stenosis. Minimal plaque in the aortic arch. Small pericardial effusion, new since prior study . No pleural effusion. No pneumothorax. Small subpleural blebs in the apices, and emphysematous changes in the upper lobes. There has been interval progression of masslike consolidation in the superior segment right lower lobe, 6 cm maximum transverse diameter, previously 4 cm, with the greater soft tissue component, persistent internal cystic air lucencies. Margins are poorly defined, with surrounding ground-glass opacities. There is a new smaller similar cavitary lesion anteriorly in the superior segment, measuring 14 mm image 76/7. There is a new similar poorly marginated area of consolidation with internal gas lucencies in the posterior right upper lobe 3.8 cm maximum transverse diameter image 59, and a separate poorly marginated sub solid focus in the anterior segment right upper lobe measuring approximately 2.5 cm, image 67/7. Minimal dependent atelectasis in the left lung without nodule or  consolidation. Enlarged AP window lymph node 11 mm short axis diameter, previously 8 mm. Additional subcentimeter prevascular, AP window, precarinal, and right paratracheal lymph nodes. Right hilar lymph nodes measured up to 11 mm short axis diameter image 60/5. Unremarkable thoracic spine and sternum. Visualized portions of upper abdomen including adrenal glands unremarkable. Review of the MIP images confirms the above findings. IMPRESSION: 1. Negative for acute PE or thoracic aortic dissection. 2. Multifocal consolidative and sub solid lesions in the right lung as detailed above. The largest has been present and has enlarged significantly since 11/13/2013 suggesting neoplasm more likely than infectious/inflammatory process. Further evaluation recommended. 3. Right hilar and mediastinal adenopathy as above. 4. Small pericardial effusion, new since prior study. Electronically Signed   By: Lucrezia Europe M.D.   On: 01/18/2015 12:49    ASSESSMENT:  No breast single right lower lobe pulmonary infiltrate.  All the CT scan has been reviewed independently and reviewed with the patient and family Case was discussed in tumor conference Thoracic surgeon was involved in discussions so was pulmonologists Possibility of navigational bronchoscopy versus open lung biopsy can be considered. Patient's insurance problem preventing some of our pulmonologists to proceed with procedure. Our social service is working on to get approval for navigational bronchoscopy All the scans were reviewed with patient all the lab data has been reviewed and this was discussed with the patient and family in detail Possibility of his gestational fibrosis versus bronchoalveolar cancer versus sarcoidosis had been discussed with the patient and family  PLAN:  No problem-specific assessment & plan notes found for this encounter.   Patient expressed understanding and was in agreement with this plan. She also understands that She can call clinic  at any time with any questions, concerns, or complaints.    No matching staging information was found for the patient.  Forest Gleason, MD   02/08/2015 7:13 AM

## 2015-02-10 ENCOUNTER — Encounter
Admission: RE | Disposition: A | Discharge status: Home / Self Care | Payer: Self-pay | Source: Ambulatory Visit | Attending: Internal Medicine

## 2015-02-10 ENCOUNTER — Ambulatory Visit: Payer: BLUE CROSS/BLUE SHIELD | Admitting: Certified Registered"

## 2015-02-10 ENCOUNTER — Ambulatory Visit: Payer: BLUE CROSS/BLUE SHIELD

## 2015-02-10 ENCOUNTER — Ambulatory Visit
Admission: RE | Admit: 2015-02-10 | Discharge: 2015-02-10 | Disposition: A | Discharge status: Home / Self Care | Payer: BLUE CROSS/BLUE SHIELD | Source: Ambulatory Visit | Attending: Internal Medicine | Admitting: Internal Medicine

## 2015-02-10 ENCOUNTER — Encounter: Payer: Self-pay | Admitting: *Deleted

## 2015-02-10 DIAGNOSIS — I429 Cardiomyopathy, unspecified: Secondary | ICD-10-CM | POA: Insufficient documentation

## 2015-02-10 DIAGNOSIS — C3431 Malignant neoplasm of lower lobe, right bronchus or lung: Secondary | ICD-10-CM | POA: Insufficient documentation

## 2015-02-10 DIAGNOSIS — R591 Generalized enlarged lymph nodes: Secondary | ICD-10-CM | POA: Diagnosis not present

## 2015-02-10 DIAGNOSIS — F418 Other specified anxiety disorders: Secondary | ICD-10-CM | POA: Diagnosis not present

## 2015-02-10 DIAGNOSIS — R079 Chest pain, unspecified: Secondary | ICD-10-CM | POA: Insufficient documentation

## 2015-02-10 DIAGNOSIS — I509 Heart failure, unspecified: Secondary | ICD-10-CM | POA: Insufficient documentation

## 2015-02-10 DIAGNOSIS — Z823 Family history of stroke: Secondary | ICD-10-CM | POA: Insufficient documentation

## 2015-02-10 DIAGNOSIS — Z8249 Family history of ischemic heart disease and other diseases of the circulatory system: Secondary | ICD-10-CM | POA: Diagnosis not present

## 2015-02-10 DIAGNOSIS — R918 Other nonspecific abnormal finding of lung field: Secondary | ICD-10-CM | POA: Insufficient documentation

## 2015-02-10 DIAGNOSIS — C771 Secondary and unspecified malignant neoplasm of intrathoracic lymph nodes: Secondary | ICD-10-CM | POA: Insufficient documentation

## 2015-02-10 DIAGNOSIS — R599 Enlarged lymph nodes, unspecified: Secondary | ICD-10-CM | POA: Insufficient documentation

## 2015-02-10 DIAGNOSIS — Z8042 Family history of malignant neoplasm of prostate: Secondary | ICD-10-CM | POA: Diagnosis not present

## 2015-02-10 DIAGNOSIS — Z87891 Personal history of nicotine dependence: Secondary | ICD-10-CM | POA: Diagnosis not present

## 2015-02-10 DIAGNOSIS — I313 Pericardial effusion (noninflammatory): Secondary | ICD-10-CM | POA: Diagnosis not present

## 2015-02-10 DIAGNOSIS — Z8701 Personal history of pneumonia (recurrent): Secondary | ICD-10-CM | POA: Diagnosis not present

## 2015-02-10 HISTORY — DX: Anxiety disorder, unspecified: F41.9

## 2015-02-10 HISTORY — PX: ENDOBRONCHIAL ULTRASOUND: SHX5096

## 2015-02-10 HISTORY — DX: Major depressive disorder, single episode, unspecified: F32.9

## 2015-02-10 HISTORY — DX: Pneumonia, unspecified organism: J18.9

## 2015-02-10 HISTORY — PX: ELECTROMAGNETIC NAVIGATION BROCHOSCOPY: SHX5369

## 2015-02-10 HISTORY — DX: Depression, unspecified: F32.A

## 2015-02-10 SURGERY — ENDOBRONCHIAL ULTRASOUND (EBUS)
Anesthesia: General

## 2015-02-10 MED ORDER — ONDANSETRON HCL 4 MG/2ML IJ SOLN
INTRAMUSCULAR | Status: DC | PRN
  Administered 2015-02-10: 4 mg via INTRAVENOUS

## 2015-02-10 MED ORDER — EPHEDRINE SULFATE 50 MG/ML IJ SOLN
INTRAMUSCULAR | Status: DC | PRN
  Administered 2015-02-10: 10 mg via INTRAVENOUS

## 2015-02-10 MED ORDER — MIDAZOLAM HCL 2 MG/2ML IJ SOLN
INTRAMUSCULAR | Status: DC | PRN
  Administered 2015-02-10: 2 mg via INTRAVENOUS

## 2015-02-10 MED ORDER — FENTANYL CITRATE (PF) 100 MCG/2ML IJ SOLN
INTRAMUSCULAR | Status: DC | PRN
  Administered 2015-02-10: 100 ug via INTRAVENOUS

## 2015-02-10 MED ORDER — PROPOFOL 10 MG/ML IV BOLUS
INTRAVENOUS | Status: DC | PRN
  Administered 2015-02-10: 150 mg via INTRAVENOUS

## 2015-02-10 MED ORDER — SUGAMMADEX SODIUM 200 MG/2ML IV SOLN
INTRAVENOUS | Status: DC | PRN
  Administered 2015-02-10: 100 mg via INTRAVENOUS

## 2015-02-10 MED ORDER — DEXAMETHASONE SODIUM PHOSPHATE 10 MG/ML IJ SOLN
INTRAMUSCULAR | Status: DC | PRN
  Administered 2015-02-10: 10 mg via INTRAVENOUS

## 2015-02-10 MED ORDER — FAMOTIDINE 20 MG PO TABS
ORAL_TABLET | ORAL | Status: AC
  Filled 2015-02-10: qty 1

## 2015-02-10 MED ORDER — METOCLOPRAMIDE HCL 5 MG/ML IJ SOLN: 10.0000 mg | Freq: Once | INTRAMUSCULAR | Status: DC | PRN

## 2015-02-10 MED ORDER — ROCURONIUM BROMIDE 100 MG/10ML IV SOLN
INTRAVENOUS | Status: DC | PRN
  Administered 2015-02-10: 40 mg via INTRAVENOUS

## 2015-02-10 MED ORDER — PHENYLEPHRINE HCL 10 MG/ML IJ SOLN
INTRAMUSCULAR | Status: DC | PRN
  Administered 2015-02-10: 100 ug via INTRAVENOUS
  Administered 2015-02-10: 200 ug via INTRAVENOUS
  Administered 2015-02-10: 100 ug via INTRAVENOUS
  Administered 2015-02-10: 200 ug via INTRAVENOUS

## 2015-02-10 MED ORDER — FAMOTIDINE 20 MG PO TABS
20.0000 mg | ORAL_TABLET | Freq: Once | ORAL | Status: AC
Start: 2015-02-10 — End: 2015-02-10
  Administered 2015-02-10: 20 mg via ORAL

## 2015-02-10 MED ORDER — ONDANSETRON HCL 4 MG/2ML IJ SOLN: 4.0000 mg | Freq: Once | INTRAMUSCULAR | Status: DC | PRN

## 2015-02-10 MED ORDER — LIDOCAINE HCL (CARDIAC) 20 MG/ML IV SOLN
INTRAVENOUS | Status: DC | PRN
  Administered 2015-02-10: 100 mg via INTRAVENOUS

## 2015-02-10 MED ORDER — FENTANYL CITRATE (PF) 100 MCG/2ML IJ SOLN: 25.0000 ug | INTRAMUSCULAR | Status: DC | PRN

## 2015-02-10 MED ORDER — LACTATED RINGERS IV SOLN
INTRAVENOUS | Status: DC
  Administered 2015-02-10: 08:00:00 via INTRAVENOUS

## 2015-02-10 NOTE — Anesthesia Postprocedure Evaluation (Signed)
Anesthesia Post Note  Patient: Deanna Blair  Procedure(s) Performed: Procedure(s) (LRB): ENDOBRONCHIAL ULTRASOUND (N/A) ELECTROMAGNETIC NAVIGATION BRONCHOSCOPY (N/A)  Patient location during evaluation: PACU Anesthesia Type: General Level of consciousness: awake Pain management: pain level controlled Vital Signs Assessment: post-procedure vital signs reviewed and stable Respiratory status: spontaneous breathing Cardiovascular status: blood pressure returned to baseline Postop Assessment: no headache Anesthetic complications: no    Last Vitals:  Filed Vitals:   02/10/15 0927 02/10/15 0942  BP: 122/64 126/69  Pulse: 83 83  Temp:    Resp: 18 20    Last Pain:  Filed Vitals:   02/10/15 0945  PainSc: Asleep                 Emrick Hensch M

## 2015-02-10 NOTE — Discharge Instructions (Signed)
Flexible Bronchoscopy, Care After Refer to this sheet in the next few weeks. These instructions provide you with information on caring for yourself after your procedure. Your health care provider may also give you more specific instructions. Your treatment has been planned according to current medical practices, but problems sometimes occur. Call your health care provider if you have any problems or questions after your procedure.  WHAT TO EXPECT AFTER THE PROCEDURE It is normal to have the following symptoms for 24-48 hours after the procedure:   Increased cough.  Low-grade fever.  Sore throat or hoarse voice.  Small streaks of blood in your thick spit (sputum) if tissue samples were taken (biopsy). HOME CARE INSTRUCTIONS   Do not eat or drink anything for 2 hours after your procedure. Your nose and throat were numbed by medicine. If you try to eat or drink before the medicine wears off, food or drink could go into your lungs or you could burn yourself. After the numbness is gone and your cough and gag reflexes have returned, you may eat soft food and drink liquids slowly.   The day after the procedure, you can go back to your normal diet.   You may resume normal activities.   Keep all follow-up visits as directed by your health care provider. It is important to keep all your appointments, especially if tissue samples were taken for testing (biopsy). SEEK IMMEDIATE MEDICAL CARE IF:   You have increasing shortness of breath.   You become light-headed or faint.   You have chest pain.   You have any new concerning symptoms.  You cough up more than a small amount of blood.  The amount of blood you cough up increases. MAKE SURE YOU:  Understand these instructions.  Will watch your condition.  Will get help right away if you are not doing well or get worse.   This information is not intended to replace advice given to you by your health care provider. Make sure you discuss  any questions you have with your health care provider.   Document Released: 07/10/2004 Document Revised: 01/11/2014 Document Reviewed: 08/25/2012 Elsevier Interactive Patient Education 2016 [place]   1) The drugs that you were given will stay in your system until tomorrow so for the next 24 hours you should not:  A) Drive an automobile B) Make any legal decisions C) Drink any alcoholic beverage   2) You may resume regular meals tomorrow.  Today it is better to start with liquids and gradually work up to solid foods.  You may eat anything you prefer, but it is better to start with liquids, then soup and crackers, and gradually work up to solid foods.   3) Please notify your doctor immediately if you have any unusual bleeding, trouble breathing, redness and pain at the surgery site, drainage, fever, or pain not relieved by medication.  4) Your post-operative visit with Dr.                                     is: Date:                        Time:    Please call to schedule your post-operative visit.  5) Additional Instructions: 6)

## 2015-02-10 NOTE — Transfer of Care (Signed)
Immediate Anesthesia Transfer of Care Note  Patient: Deanna Blair  Procedure(s) Performed: Procedure(s): ENDOBRONCHIAL ULTRASOUND (N/A) ELECTROMAGNETIC NAVIGATION BRONCHOSCOPY (N/A)  Patient Location: PACU  Anesthesia Type:General  Level of Consciousness: awake, alert , oriented and patient cooperative  Airway & Oxygen Therapy: Patient Spontanous Breathing and Patient connected to face mask oxygen  Post-op Assessment: Report given to RN, Post -op Vital signs reviewed and stable and Patient moving all extremities X 4  Post vital signs: Reviewed and stable  Last Vitals:  Filed Vitals:   02/10/15 0608  BP: 132/85  Pulse: 76  Temp: 36.2 C  Resp: 16    Complications: No apparent anesthesia complications

## 2015-02-10 NOTE — Op Note (Signed)
Electromagnetic Navigation Bronchoscopy: Indication: lung mass  Preoperative Diagnosis:lung mass Post Procedure Diagnosis: lung mass Consent: written Risks and benefits explained in detail including risk of infection, bleeding, respiratory failure and death.   Hand washing performed prior to starting the procedure.   Type of Anesthesia: see Anesthesiology records .   Procedure Performed:  Virtual Bronchoscopy with Multi-planar Image analysis, 3-D reconstruction of coronal, sagittal and multi-planar images for the purposes of planning real-time bronchoscopy using the i[employer] Electromagnetic Navigation Bronchoscopy System (superDimension)..   Description of Procedure: After obtaining informed consent from the patient, the above sedative and anesthetic measures were carried out, flexible fiberoptic bronchoscope was inserted via an oral bite block. Posterior pharynx was clear. The 2 vocal cords were easily traversed after application of local anesthetic.  The virtual camera was then placed into the central portion of the trachea. The trachea itself was inspected.  The main carina, right and left midstem bronchus and all the segmental and subsegmental airways by virtual bronchoscopy were brieftly inspected.  The camera was directed to standard registration points at the following centers: main carina, right upper lobe bronchus, right lower lobe bronchus, right middle lobe bronchus, left upper lobe bronchus, and the left lower lobe bronchus. This data was transferred to the i-Log[employer] ENB system for real-time bronchoscopy.  The scope was nivigated to superior segment of RLL for tissue sampling  Specimans Obtained:from RLL mass   Forceps Biopsy times:5  Triple Needle Brush:2    Fluoroscopy:  Fluoroscopy was utilized during the course of this procedure to assure that biopsies were taken in a safe manner under fluoroscopic guidance with no spot films required.   Complications:none  Estimated Blood  Loss: none  Monitoring:  The patient was monitored with continuous oximetry and received supplemental nasal cannula oxygen throughout the procedure. In addition, serial blood pressure measurements and continuous electrocardiography showed these physiologic parameters to remain tolerable throughout the procedure.   Assessment and Plan/Additional Comments:follow up pathology results    Corrin Parker, M.D.  [facility]  Medical Director [facility] Director [facility] Cardio-Pulmonary Department

## 2015-02-10 NOTE — H&P (View-Only) (Signed)
[place] @ [facility] Telephone:(336) [phone]  Fax:(336) [phone]   INITIAL CONSULT  Deanna Blair OB: [birth year]  MR#: [id number]  YSA#:[id number]  Patient Care Team: Mar Daring, PA-C as PCP - General (Family Medicine) Allyne Gee, MD as Referring Physician (Internal Medicine)  CHIEF COMPLAINT:  Chief Complaint  Patient presents with  . New Evaluation  1.  Patient had an abnormal CT scan with progressing right lower lobe infiltrate  VISIT DIAGNOSIS:  Abnormal CT scan of chest  No history exists.      INTERVAL HISTORY:  60 year old gentleman lady who had a previous history of smoking for last 30 years patient quit smoking approximately a few years ago.  Member of 2015 patient presented to primary physician with cough was told that she has pneumonia was treated with antibiotic.  Had an abnormal CT scan with right lower lobe infiltrate.  In December 2015 patient started having sharp chest pain went to emergency room but a repeat CT scan revealed progressing right lower lobe pulmonary infiltrate.  Patient was seen by pulmonologists Dr. Chancy Milroy and after few rounds of antibiotic a repeat CT scan revealed progressing disease.  Patient was referred to me for further evaluation and treatment consideration extremly anxious and apprehensive lady. Has  a dry hacking cough.\No hemoptysis.  No chest pain.  REVIEW OF SYSTEMS:   GENERAL:  Feels good.  Active.  No fevers, sweats or weight loss. PERFORMANCE STATUS (ECOG):  0 HEENT:  No visual changes, runny nose, sore throat, mouth sores or tenderness. Lungs: Increasing cough and shortness of breath Cardiac:  No chest pain, palpitations, orthopnea, or PND. GI:  No nausea, vomiting, diarrhea, constipation, melena or hematochezia. GU:  No urgency, frequency, dysuria, or hematuria. Musculoskeletal:  No back pain.  No joint pain.  No muscle tenderness. Extremities:  No pain or swelling. Skin:  No rashes or skin changes. Neuro:  No  headache, numbness or weakness, balance or coordination issues. Endocrine:  No diabetes, thyroid issues, hot flashes or night sweats. Psych:  No mood changes, depression he had patient's's family is slightly apprehensive and anxious Pain:  No focal pain. Review of systems:  All other systems reviewed and found to be negative.  As per HPI. Otherwise, a complete review of systems is negatve.  PAST MEDICAL HISTORY: Past Medical History  Diagnosis Date  . Depression with anxiety     Well controlled with Wellbutrin and Buspar  . Pneumonia   . Anxiety   . Depression     PAST SURGICAL HISTORY: Past Surgical History  Procedure Laterality Date  . Ectopic pregnancy surgery  1988    FAMILY HISTORY Family History  Problem Relation Age of Onset  . Cancer Father     Prostate Cancer  . Hypertension Father   . Stroke Father   . Hypertension Brother     GYNECOLOGIC HISTORY:  No LMP recorded. Patient is postmenopausal.     ADVANCED DIRECTIVES:  Patient does not have any living will or healthcare power of attorney.  Information was given .  Available resources had been discussed.  We will follow-up on subsequent appointments regarding this issue  HEALTH MAINTENANCE: Social History  Substance Use Topics  . Smoking status: Former Smoker -- 0.25 packs/day    Types: Cigarettes    Quit date: 02/06/2005  . Smokeless tobacco: Never Used  . Alcohol Use: 3.0 oz/week    5 Cans of beer per week     Comment: 5 beers weekly  Colonoscopy:  PAP:  Bone density:  Lipid panel:  No Known Allergies  No current outpatient prescriptions on file.   No current facility-administered medications for this visit.    OBJECTIVE: PHYSICAL EXAM  General  status: Performance status is good.  Patient has not lost significant weight. Since last evaluation there is no significant change in the general status HEENT: No evidence of stomatitis. Sclera and conjunctivae :: No jaundice.   pale  looking. Lungs: Air  entry equal on both sides.  Coarse crepitation in the right lower lobe.  Cardiac: Heart sounds are normal.  No pericardial rub.  No murmur. Lymphatic system: Cervical, axillary, inguinal, lymph nodes not palpable GI: Abdomen is soft.liver and spleen not palpable.  No ascites.  Bowel sounds are normal.  No other palpable masses.  No tenderness . Lower extremity: No edema Neurological system: Higher functions, cranial nerves intact no evidence of peripheral neuropathy. Skin: No rash.  No ecchymosis.. No petechial hemorrhages  Filed Vitals:   02/06/15 1333  BP: 120/74  Pulse: 101  Temp: 97.7 F (36.5 C)  Resp: 18     Body mass index is 21.24 kg/(m^2).    ECOG FS:0 - Asymptomatic  LAB RESULTS:  No visits with results within 5 Day(s) from this visit. Latest known visit with results is:  Admission on 01/18/2015, Discharged on 01/18/2015  Component Date Value Ref Range Status  . Sodium 01/18/2015 140  135 - 145 mmol/L Final  . Potassium 01/18/2015 3.6  3.5 - 5.1 mmol/L Final  . Chloride 01/18/2015 105  101 - 111 mmol/L Final  . CO2 01/18/2015 22  22 - 32 mmol/L Final  . Glucose, Bld 01/18/2015 121* 65 - 99 mg/dL Final  . BUN 01/18/2015 14  6 - 20 mg/dL Final  . Creatinine, Ser 01/18/2015 0.65  0.44 - 1.00 mg/dL Final  . Calcium 01/18/2015 9.6  8.9 - 10.3 mg/dL Final  . GFR calc non Af Amer 01/18/2015 >60  >60 mL/min Final  . GFR calc Af Amer 01/18/2015 >60  >60 mL/min Final   Comment: (NOTE) The eGFR has been calculated using the CKD EPI equation. This calculation has not been validated in all clinical situations. eGFR's persistently <60 mL/min signify possible Chronic Kidney Disease.   . Anion gap 01/18/2015 13  5 - 15 Final  . Troponin I 01/18/2015 <0.03  <0.031 ng/mL Final   Comment:        NO INDICATION OF MYOCARDIAL INJURY.   . WBC 01/18/2015 9.5  3.6 - 11.0 K/uL Final  . RBC 01/18/2015 3.86  3.80 - 5.20 MIL/uL Final  . Hemoglobin 01/18/2015 12.5   12.0 - 16.0 g/dL Final  . HCT 01/18/2015 37.0  35.0 - 47.0 % Final  . MCV 01/18/2015 95.8  80.0 - 100.0 fL Final  . MCH 01/18/2015 32.4  26.0 - 34.0 pg Final  . MCHC 01/18/2015 33.8  32.0 - 36.0 g/dL Final  . RDW 01/18/2015 14.6* 11.5 - 14.5 % Final  . Platelets 01/18/2015 241  150 - 440 K/uL Final     STUDIES: Dg Chest 2 View  01/18/2015  CLINICAL DATA:  Patient states diagnosed with pneumonia in December. Complains of sharp right-sided chest pain with coughing or deep inspiration. EXAM: CHEST  2 VIEW COMPARISON:  01/03/2015, 12/19/2014, 11/12/2013 as well as chest CT 11/13/2013. FINDINGS: Lungs are hyperinflated with mild interval worsening right hilar/ perihilar opacification and mixed interstitial airspace density over the right mid to lower lung. No evidence of effusion   or pneumothorax. Cardiomediastinal silhouette and remainder of the exam is unchanged. IMPRESSION: Worsening right hilar/perihilar opacification with slight worsening mixed interstitial airspace density over the right mid to lower lung. Recommend chest CT with contrast for further evaluation to compare with previous chest CT as findings may be due to worsening infection/ atypical infection or underlying malignancy. Electronically Signed   By: Daniel  Boyle M.D.   On: 01/18/2015 10:05   Ct Chest W Contrast  01/31/2015  CLINICAL DATA:  Follow-up of pneumonia. EXAM: CT CHEST WITH CONTRAST TECHNIQUE: Multidetector CT imaging of the chest was performed during intravenous contrast administration. CONTRAST:  75mL OMNIPAQUE IOHEXOL 300 MG/ML  SOLN COMPARISON:  01/18/2015 FINDINGS: Mediastinum/Lymph Nodes: The heart is normal in size. There is persistent pericardial thickening versus effusion, stable in size. There are unchanged mediastinal lymph nodes, with the largest left AP window lymph node measuring 10 mm in short axis. The remainder of the bilateral prevascular, right peritracheal and precarinal lymph nodes are less than 1 cm in  short axis. Lungs/Pleura: There is persistent solid and sub solid posterior subpleural mass with extensive cystic degeneration in the superior segment of the right upper lobe measuring 3.8 by 6.2 by 6.3 cm. It abuts the posterior and medial pleura. Extensive surrounding ground-glass opacities are seen. There is a 6 mm posterior subpleural solid satellite nodule slightly more inferiorly in the right lower lobe. Additional areas of interstitial thickening and ground-glass opacities are seen in the basilar segment of right lower lobe, measuring approximately 5 cm, superior segment of the right upper lobe, right middle lobe, and posterior segment of the right lower lobe, where there are 3 lesions measuring up to 4 cm. There is subpleural right apical scarring. There is no evidence of pleura effusion. The left lung is clear.  The airway is patent. Upper abdomen: No acute findings. Musculoskeletal: No chest wall mass or suspicious bone lesions identified. IMPRESSION: Persistent solids and sub solid superior segment of right lower lobe mass, highly suspicious for slow growing primary lung malignancy such as bronchoalveolar carcinoma. Alternatively, but less likely, this may represent an ongoing fungal infection. Areas of interstitial thickening and ground-glass opacities within the right lower lobe, right middle lobe, and posterior segment of the right upper lobe are highly suspicious for satellite sites of malignancy. These could alternatively represent areas of interstitial hemorrhage. Borderline mediastinal lymphadenopathy. Persistent pericardial effusion or thickening.  Query metastatic. Oncology consult is recommended. These results will be called to the ordering clinician or representative by the Radiologist Assistant, and communication documented in the PACS or zVision Dashboard. Electronically Signed   By: Dobrinka  Dimitrova M.D.   On: 01/31/2015 15:49   Ct Angio Chest Pe W/cm &/or Wo Cm  01/18/2015  CLINICAL  DATA:  Pt to ed with c/o right sided sharp chest pain that occurs when she coughs or takes a deep breath. Pt denies sob, denies radiation of pain, denies weakness, denies n/v. EXAM: CT ANGIOGRAPHY CHEST WITH CONTRAST TECHNIQUE: Multidetector CT imaging of the chest was performed using the standard protocol during bolus administration of intravenous contrast. Multiplanar CT image reconstructions and MIPs were obtained to evaluate the vascular anatomy. CONTRAST:  80mL OMNIPAQUE IOHEXOL 350 MG/ML SOLN COMPARISON:  11/13/2013 FINDINGS: Right arm IV contrast injection. The SVC is patent. RV/LV ratio less than 1. Satisfactory opacification of pulmonary arteries noted, and there is no evidence of pulmonary emboli. Patent pulmonary veins bilaterally. Adequate contrast opacification of the thoracic aorta with no evidence of dissection, aneurysm, or stenosis. There   is classic 3-vessel brachiocephalic arch anatomy without proximal stenosis. Minimal plaque in the aortic arch. Small pericardial effusion, new since prior study . No pleural effusion. No pneumothorax. Small subpleural blebs in the apices, and emphysematous changes in the upper lobes. There has been interval progression of masslike consolidation in the superior segment right lower lobe, 6 cm maximum transverse diameter, previously 4 cm, with the greater soft tissue component, persistent internal cystic air lucencies. Margins are poorly defined, with surrounding ground-glass opacities. There is a new smaller similar cavitary lesion anteriorly in the superior segment, measuring 14 mm image 76/7. There is a new similar poorly marginated area of consolidation with internal gas lucencies in the posterior right upper lobe 3.8 cm maximum transverse diameter image 59, and a separate poorly marginated sub solid focus in the anterior segment right upper lobe measuring approximately 2.5 cm, image 67/7. Minimal dependent atelectasis in the left lung without nodule or  consolidation. Enlarged AP window lymph node 11 mm short axis diameter, previously 8 mm. Additional subcentimeter prevascular, AP window, precarinal, and right paratracheal lymph nodes. Right hilar lymph nodes measured up to 11 mm short axis diameter image 60/5. Unremarkable thoracic spine and sternum. Visualized portions of upper abdomen including adrenal glands unremarkable. Review of the MIP images confirms the above findings. IMPRESSION: 1. Negative for acute PE or thoracic aortic dissection. 2. Multifocal consolidative and sub solid lesions in the right lung as detailed above. The largest has been present and has enlarged significantly since 11/13/2013 suggesting neoplasm more likely than infectious/inflammatory process. Further evaluation recommended. 3. Right hilar and mediastinal adenopathy as above. 4. Small pericardial effusion, new since prior study. Electronically Signed   By: Lucrezia Europe M.D.   On: 01/18/2015 12:49    ASSESSMENT:  No breast single right lower lobe pulmonary infiltrate.  All the CT scan has been reviewed independently and reviewed with the patient and family Case was discussed in tumor conference Thoracic surgeon was involved in discussions so was pulmonologists Possibility of navigational bronchoscopy versus open lung biopsy can be considered. Patient's insurance problem preventing some of our pulmonologists to proceed with procedure. Our social service is working on to get approval for navigational bronchoscopy All the scans were reviewed with patient all the lab data has been reviewed and this was discussed with the patient and family in detail Possibility of his gestational fibrosis versus bronchoalveolar cancer versus sarcoidosis had been discussed with the patient and family  PLAN:  No problem-specific assessment & plan notes found for this encounter.   Patient expressed understanding and was in agreement with this plan. She also understands that She can call clinic  at any time with any questions, concerns, or complaints.    No matching staging information was found for the patient.  Forest Gleason, MD   02/08/2015 7:13 AM

## 2015-02-10 NOTE — Interval H&P Note (Signed)
History and Physical Interval Note:  02/10/2015 7:36 AM  Deanna Blair  has presented today for surgery, with the diagnosis of right lower lobe mass  The various methods of treatment have been discussed with the patient and family. After consideration of risks, benefits and other options for treatment, the patient has consented to  Procedure(s): ENDOBRONCHIAL ULTRASOUND (N/A) ELECTROMAGNETIC NAVIGATION BRONCHOSCOPY (N/A) as a surgical intervention .  The patient's history has been reviewed, patient examined, no change in status, stable for surgery.  I have reviewed the patient's chart and labs.  Questions were answered to the patient's satisfaction.     Flora Lipps

## 2015-02-10 NOTE — Anesthesia Preprocedure Evaluation (Signed)
Anesthesia Evaluation  Patient identified by MRN, date of birth, ID band Patient awake    Reviewed: Allergy & Precautions, NPO status , Patient's Chart, lab work & pertinent test results  Airway Mallampati: II  TM Distance: >3 FB Neck ROM: Full    Dental  (+) Upper Dentures, Lower Dentures   Pulmonary former smoker,    Pulmonary exam normal        Cardiovascular Exercise Tolerance: Poor +CHF   Rhythm:Regular Rate:Normal  Hx of cardiomyopathy, now a lung mass.   Neuro/Psych Anxiety Depression    GI/Hepatic   Endo/Other    Renal/GU      Musculoskeletal   Abdominal (+)  Abdomen: soft.    Peds  Hematology   Anesthesia Other Findings   Reproductive/Obstetrics                             Anesthesia Physical Anesthesia Plan  ASA: III  Anesthesia Plan: General   Post-op Pain Management:    Induction: Intravenous  Airway Management Planned: Oral ETT  Additional Equipment:   Intra-op Plan:   Post-operative Plan: Extubation in OR  Informed Consent: I have reviewed the patients History and Physical, chart, labs and discussed the procedure including the risks, benefits and alternatives for the proposed anesthesia with the patient or authorized representative who has indicated his/her understanding and acceptance.     Plan Discussed with: CRNA  Anesthesia Plan Comments:         Anesthesia Quick Evaluation

## 2015-02-10 NOTE — Op Note (Signed)
PROCEDURE: ENDOBRONCHIAL ULTRASOUND   PROCEDURE DATE: 02/10/2015  TIME:  NAME:  Deanna Blair  DOB:1955/06/21  MRN: [id number] LOC:  ARPO/None    HOSP DAY: '@[email or web address]'$ @ CODE STATUS:   Code Status History    This patient does not have a recorded code status. Please follow your organizational policy for patients in this situation.          Indications/Preliminary Diagnosis:   Consent: (Place X beside choice/s below)  The benefits, risks and possible complications of the procedure were        explained to:  _x__ patient  _x__ patient's family  ___ other:___________  who verbalized understanding and gave:  ___ verbal  ___ written  _x__ verbal and written  ___ telephone  ___ other:________ consent.      Unable to obtain consent; procedure performed on emergent basis.     Other:       PRESEDATION ASSESSMENT: History and Physical has been performed. Patient meds and allergies have been reviewed. Presedation airway examination has been performed and documented. Baseline vital signs, sedation score, oxygenation status, and cardiac rhythm were reviewed. Patient was deemed to be in satisfactory condition to undergo the procedure.    PREMEDICATIONS: SEE ANESTHESIOLOGY RECORDS   Airway Prep (Place X beside choice below)   1% Transtracheal Lidocaine Anesthetization 7 cc   Patient prepped per Bronchoscopy Lab Policy       Insertion Route (Place X beside choice below)   Nasal   Oral  x Endotracheal Tube   Tracheostomy   INTRAPROCEDURE MEDICATIONS: SEE ANESTHESIOLOGY RECORDS   PROCEDURE DETAILS: Timeout performed and correct patient, name, & ID confirmed. Following prep per Pulmonary policy, appropriate sedation was administered.  Airway exam proceeded with findings, technical procedures, and specimen collection as noted below. At the end of exam the scope was withdrawn without incident. Impression and Plan as noted below. The EBUS was targeted at subcarinal space, there  was a another paratracheal LN seen but was surrounded by blood vessels  I was able to visualize a subcarinal LN approx 2x2 CM under ultrasound, I was able to take approx 7 samples       TECHNICAL PROCEDURES: (Place X beside choice below)   Procedures  Description    None     Electrocautery     Cryotherapy     Balloon Dilatation     Bronchography     Stent Placement     Therapeutic Aspiration     Laser/Argon Plasma    Brachytherapy Catheter Placement    Foreign Body Removal     SPECIMENS (Sites): (Place X beside choice below)  Specimens Description   No Specimens Obtained     Washings    Lavage    Biopsies   x Fine Needle Aspirates 7 samples   Brushings    Sputum    FINDINGS: subcarinal LN ESTIMATED BLOOD LOSS: none COMPLICATIONS/RESOLUTION: none       IMPRESSION:POST-PROCEDURE DX:/  RECOMMENDATION/PLAN: follow up pathology reports     Corrin Parker, M.D.  [facility]  Medical Director [place] Director [place] Department

## 2015-02-10 NOTE — Anesthesia Procedure Notes (Signed)
Procedure Name: Intubation Date/Time: 02/10/2015 7:58 AM Performed by: Silvana Newness Pre-anesthesia Checklist: Patient identified, Emergency Drugs available, Suction available, Patient being monitored and Timeout performed Patient Re-evaluated:Patient Re-evaluated prior to inductionOxygen Delivery Method: Circle system utilized Preoxygenation: Pre-oxygenation with 100% oxygen Intubation Type: IV induction Ventilation: Mask ventilation without difficulty Laryngoscope Size: Mac and 3 Grade View: Grade I Tube type: Oral Number of attempts: 1 Airway Equipment and Method: Rigid stylet Placement Confirmation: ETT inserted through vocal cords under direct vision,  positive ETCO2 and breath sounds checked- equal and bilateral Secured at: 20 cm Tube secured with: Tape Dental Injury: Teeth and Oropharynx as per pre-operative assessment

## 2015-02-12 LAB — CYTOLOGY - NON PAP

## 2015-02-12 LAB — SURGICAL PATHOLOGY

## 2015-02-13 ENCOUNTER — Inpatient Hospital Stay (HOSPITAL_BASED_OUTPATIENT_CLINIC_OR_DEPARTMENT_OTHER): Payer: BLUE CROSS/BLUE SHIELD | Admitting: Oncology

## 2015-02-13 ENCOUNTER — Encounter: Payer: Self-pay | Admitting: *Deleted

## 2015-02-13 ENCOUNTER — Encounter: Payer: Self-pay | Admitting: Oncology

## 2015-02-13 VITALS — BP 134/85 | HR 73 | Temp 96.3°F | Resp 18 | Wt 114.0 lb

## 2015-02-13 DIAGNOSIS — F418 Other specified anxiety disorders: Secondary | ICD-10-CM | POA: Diagnosis not present

## 2015-02-13 DIAGNOSIS — Z808 Family history of malignant neoplasm of other organs or systems: Secondary | ICD-10-CM

## 2015-02-13 DIAGNOSIS — Z87891 Personal history of nicotine dependence: Secondary | ICD-10-CM

## 2015-02-13 DIAGNOSIS — N323 Diverticulum of bladder: Secondary | ICD-10-CM | POA: Diagnosis not present

## 2015-02-13 DIAGNOSIS — C3491 Malignant neoplasm of unspecified part of right bronchus or lung: Secondary | ICD-10-CM

## 2015-02-13 DIAGNOSIS — C3431 Malignant neoplasm of lower lobe, right bronchus or lung: Secondary | ICD-10-CM | POA: Diagnosis not present

## 2015-02-13 DIAGNOSIS — C779 Secondary and unspecified malignant neoplasm of lymph node, unspecified: Secondary | ICD-10-CM

## 2015-02-13 DIAGNOSIS — R590 Localized enlarged lymph nodes: Secondary | ICD-10-CM

## 2015-02-13 DIAGNOSIS — C3481 Malignant neoplasm of overlapping sites of right bronchus and lung: Secondary | ICD-10-CM

## 2015-02-13 DIAGNOSIS — I313 Pericardial effusion (noninflammatory): Secondary | ICD-10-CM | POA: Diagnosis not present

## 2015-02-13 DIAGNOSIS — Z8701 Personal history of pneumonia (recurrent): Secondary | ICD-10-CM | POA: Diagnosis not present

## 2015-02-13 DIAGNOSIS — C3490 Malignant neoplasm of unspecified part of unspecified bronchus or lung: Secondary | ICD-10-CM

## 2015-02-13 HISTORY — DX: Malignant neoplasm of unspecified part of right bronchus or lung: C34.91

## 2015-02-13 NOTE — Progress Notes (Signed)
Patient here for biopsy results.

## 2015-02-13 NOTE — Progress Notes (Signed)
[place] @ [facility] [phone]  [phone]   INITIAL CONSULT  Lelah Rennaker OB: Apr 19, 1955  MR#: [id number]  LYY#:[id number]  Patient Care Team: Mar Daring, PA-C as PCP - General (Family Medicine) Allyne Gee, MD as Referring Physician (Internal Medicine)  CHIEF COMPLAINT:  Chief Complaint  Patient presents with  . 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      INTERVAL HISTORY:  60 year old gentleman lady who had a previous history of smoking for last 30 years patient quit smoking approximately a few years ago.  Member of 2015 patient presented to primary physician with cough was told that she has pneumonia was treated with antibiotic.  Had an abnormal CT scan with right lower lobe infiltrate.  In December 2015 patient started having sharp chest pain went to emergency room but a repeat CT scan revealed progressing right lower lobe pulmonary infiltrate.  Patient was seen by pulmonologists Dr. Humphrey Rolls  and after few rounds of antibiotic a repeat CT scan revealed progressing disease.  Patient was referred to me for further evaluation and treatment consideration extremly anxious and apprehensive lady. Has  a dry hacking cough.\No hemoptysis.  No chest pain. Patient underwent bronchoscopy and was found to have adenocarcinoma.  Carinal lymph node was also positive for adenocarcinoma The patient is here to discuss the results and further planning treatment  REVIEW OF SYSTEMS:   GENERAL:  Feels good.  Active.  No fevers, sweats or weight loss. PERFORMANCE STATUS (ECOG):  0 HEENT:  No visual changes, runny nose, sore throat, mouth sores or tenderness. Lungs: Increasing cough and shortness of breath Cardiac:  No chest pain, palpitations, orthopnea, or PND. GI:  No nausea, vomiting,  diarrhea, constipation, melena or hematochezia. GU:  No urgency, frequency, dysuria, or hematuria. Musculoskeletal:  No back pain.  No joint pain.  No muscle tenderness. Extremities:  No pain or swelling. Skin:  No rashes or skin changes. Neuro:  No headache, numbness or weakness, balance or coordination issues. Endocrine:  No diabetes, thyroid issues, hot flashes or night sweats. Psych:  No mood changes, depression he had patient's's family is slightly apprehensive and anxious Pain:  No focal pain. Review of systems:  All other systems reviewed and found to be negative.  As per HPI. Otherwise, a complete review of systems is negatve.  PAST MEDICAL HISTORY: Past Medical History  Diagnosis Date  . Depression with anxiety     Well controlled with Wellbutrin and Buspar  . Pneumonia   . Anxiety   . Depression   . Cancer of right lung ([place]) 02/13/2015    PAST SURGICAL HISTORY: Past Surgical History  Procedure Laterality Date  . Ectopic pregnancy surgery  1988  . Endobronchial ultrasound N/A 02/10/2015    Procedure: ENDOBRONCHIAL ULTRASOUND;  Surgeon: Flora Lipps, MD;  Location: [facility] ORS;  Service: Cardiopulmonary;  Laterality: N/A;  . Electromagnetic navigation brochoscopy N/A 02/10/2015    Procedure: ELECTROMAGNETIC NAVIGATION BRONCHOSCOPY;  Surgeon: Flora Lipps, MD;  Location: [facility] ORS;  Service: Cardiopulmonary;  Laterality: N/A;    FAMILY HISTORY Family History  Problem Relation Age of Onset  . Cancer Father     Prostate Cancer  . Hypertension Father   . Stroke Father   . Hypertension Brother     GYNECOLOGIC HISTORY:  No LMP recorded. Patient  is postmenopausal.     ADVANCED DIRECTIVES:  Patient does not have any living will or healthcare power of attorney.  Information was given .  Available resources had been discussed.  We will follow-up on subsequent appointments regarding this issue  HEALTH MAINTENANCE: Social History  Substance Use Topics  . Smoking status: Former  Smoker -- 0.25 packs/day    Types: Cigarettes    Quit date: 02/06/2005  . Smokeless tobacco: Never Used  . Alcohol Use: 3.0 oz/week    5 Cans of beer per week     Comment: 5 beers weekly     Colonoscopy:  PAP:  Bone density:  Lipid panel:  No Known Allergies  No current outpatient prescriptions on file.   No current facility-administered medications for this visit.    OBJECTIVE: PHYSICAL EXAM  General  status: Performance status is good.  Patient has not lost significant weight. Since last evaluation there is no significant change in the general status HEENT: No evidence of stomatitis. Sclera and conjunctivae :: No jaundice.   pale looking. Lungs: Air  entry equal on both sides.  Coarse crepitation in the right lower lobe.  Cardiac: Heart sounds are normal.  No pericardial rub.  No murmur. Lymphatic system: Cervical, axillary, inguinal, lymph nodes not palpable GI: Abdomen is soft.liver and spleen not palpable.  No ascites.  Bowel sounds are normal.  No other palpable masses.  No tenderness . Lower extremity: No edema Neurological system: Higher functions, cranial nerves intact no evidence of peripheral neuropathy. Skin: No rash.  No ecchymosis.. No petechial hemorrhages  Filed Vitals:   02/13/15 0814  BP: 134/85  Pulse: 73  Temp: 96.3 F (35.7 C)  Resp: 18     Body mass index is 20.84 kg/(m^2).    ECOG FS:0 - Asymptomatic  LAB RESULTS:  Admission on 02/10/2015, Discharged on 02/10/2015  Component Date Value Ref Range Status  . CYTOLOGY - NON GYN 02/10/2015    Final                   Value:Cytology - Non PAP CASE: [id number] PATIENT: Tamera Punt Non-Gyn Cytology Report     SPECIMEN SUBMITTED: A. Lung, right lower lobe, brushing  CLINICAL HISTORY: None Provided  PRE-OPERATIVE DIAGNOSIS: None provided  POST-OPERATIVE DIAGNOSIS: None provided.     DIAGNOSIS: A. RIGHT LOWER LOBE; ENB GUIDED BRUSHING: - POSITIVE FOR MALIGNANCY, CONSISTENT WITH  ADENOCARCINOMA.   GROSS DESCRIPTION: A. Site: Right lower lobe Procedure: ENB Cytotechnologist: Georgina Snell, Zyretta Gordon Specimen(s) collected: 2 Diff Quik stained slides 2 Pap stained slides      Volume: 30 mL      Description: Clear fluid with a small amount of pink red soft tissue on a collection brush      Submitted for:           ThinPrep    Final Diagnosis performed by Delorse Lek, MD.  Electronically signed 02/12/2015 5:29:48PM    The electronic signature indicates that the named Attending Pathologist has evaluated the specimen  Technical component performed at [facility], [address] [place], [place] [address] Lab: [phone] Dir: Darrick Penna. Evette Doffing, MD  Professional component performed at [facility], [facility], [place], [place], [place] [address] Lab: [phone] Dir: Dellia Nims. Reuel Derby, MD    . SURGICAL PATHOLOGY 02/10/2015    Final  Value:Surgical Pathology CASE: [id number] PATIENT: Tamera Punt Surgical Pathology Report     SPECIMEN SUBMITTED: A. Lung, right lower lobe, biopsy  CLINICAL HISTORY: None provided  PRE-OPERATIVE DIAGNOSIS: None provided  POST-OPERATIVE DIAGNOSIS: None provided.     DIAGNOSIS: A. LUNG, RIGHT LOWER LOBE; ENB GUIDED BIOPSY: - ADENOCARCINOMA, SEE NOTE.  Note:  Lepidic and acinar patterns are present in this small biopsy.   GROSS DESCRIPTION: A. Site: Right lower lobe Procedure: ENB Cytotechnologist: Georgina Snell, Zyretta Gordon Specimen(s) collected: 2 Diff Quik stained slides Tissue fragment(s): 6  Size: 0.2 x 0.1 up to 0.5 x 0.1 cm  Description: Cylindrically shaped pieces of gray-pink to red soft tissue and blood clot  Entirely submitted in 3 cassette(s).     Final Diagnosis performed by Delorse Lek, MD.  Electronically signed 02/12/2015 5:28:00PM    The electronic signature indicates that the named Attending  Pathologist has evaluated the                          specimen  Technical component performed at [facility], [address] [place], [place] [address] Lab: [phone] Dir: [place]rick Penna. Evette Doffing, MD  Professional component performed at [facility], [facility], [place], [place], [place] [address] Lab: [phone] Dir: Dellia Nims. Reuel Derby, MD    . CYTOLOGY - NON GYN 02/10/2015    Final                   Value:Cytology - Non PAP CASE: [id number] PATIENT: Tamera Punt Non-Gyn Cytology Report     SPECIMEN SUBMITTED: A. FNA, subcarina lymph node  CLINICAL HISTORY: None Provided  PRE-OPERATIVE DIAGNOSIS: None provided  POST-OPERATIVE DIAGNOSIS: None provided.     DIAGNOSIS: A. LYMPH NODE, SUBCARINAL; EBUS GUIDED FINE NEEDLE ASPIRATION: - POSITIVE FOR METASTATIC ADENOCARCINOMA.  Note: A cell block was included the interpretation of this CASE.   GROSS DESCRIPTION: A. Site: Subcarina lymph node Procedure: EBUS Cytotechnologist: Georgina Snell, Micheline Rough Specimen(s) collected: 2 Diff Quik stained slides 2 Pap stained slides      Volume: 30 mL      Description: Pink red fluid with a small amount of tan soft material      Submitted for:           ThinPrep           Cell block(s): 1   Final Diagnosis performed by Delorse Lek, MD.  Electronically signed 02/12/2015 5:33:47PM    The electronic signature indicates that the named Attending Pathologist h                         as evaluated the specimen  Technical component performed at [facility], [address], [place], [place] [address] Lab: [phone] Dir: Darrick Penna. Evette Doffing, MD  Professional component performed at [facility], [facility], [place], [place], [place] [address] Lab: [phone] Dir: Dellia Nims. Rubinas, MD       STUDIES: Dg Chest 2 View  01/18/2015  CLINICAL DATA:  Patient states diagnosed with pneumonia in December. Complains of sharp right-sided  chest pain with coughing or deep inspiration. EXAM: CHEST  2 VIEW COMPARISON:  01/03/2015, 12/19/2014, 11/12/2013 as well as chest CT 11/13/2013. FINDINGS: Lungs are hyperinflated with mild interval worsening right hilar/ perihilar opacification and mixed interstitial airspace density over the right mid to lower lung. No evidence of effusion or pneumothorax. Cardiomediastinal silhouette and remainder of the exam is unchanged. IMPRESSION: Worsening right  hilar/perihilar opacification with slight worsening mixed interstitial airspace density over the right mid to lower lung. Recommend chest CT with contrast for further evaluation to compare with previous chest CT as findings may be due to worsening infection/ atypical infection or underlying malignancy. Electronically Signed   By: Marin Olp M.D.   On: 01/18/2015 10:05   Ct Chest W Contrast  01/31/2015  CLINICAL DATA:  Follow-up of pneumonia. EXAM: CT CHEST WITH CONTRAST TECHNIQUE: Multidetector CT imaging of the chest was performed during intravenous contrast administration. CONTRAST:  16m OMNIPAQUE IOHEXOL 300 MG/ML  SOLN COMPARISON:  01/18/2015 FINDINGS: Mediastinum/Lymph Nodes: The heart is normal in size. There is persistent pericardial thickening versus effusion, stable in size. There are unchanged mediastinal lymph nodes, with the largest left AP window lymph node measuring 10 mm in short axis. The remainder of the bilateral prevascular, right peritracheal and precarinal lymph nodes are less than 1 cm in short axis. Lungs/Pleura: There is persistent solid and sub solid posterior subpleural mass with extensive cystic degeneration in the superior segment of the right upper lobe measuring 3.8 by 6.2 by 6.3 cm. It abuts the posterior and medial pleura. Extensive surrounding ground-glass opacities are seen. There is a 6 mm posterior subpleural solid satellite nodule slightly more inferiorly in the right lower lobe. Additional areas of interstitial thickening  and ground-glass opacities are seen in the basilar segment of right lower lobe, measuring approximately 5 cm, superior segment of the right upper lobe, right middle lobe, and posterior segment of the right lower lobe, where there are 3 lesions measuring up to 4 cm. There is subpleural right apical scarring. There is no evidence of pleura effusion. The left lung is clear.  The airway is patent. Upper abdomen: No acute findings. Musculoskeletal: No chest wall mass or suspicious bone lesions identified. IMPRESSION: Persistent solids and sub solid superior segment of right lower lobe mass, highly suspicious for slow growing primary lung malignancy such as bronchoalveolar carcinoma. Alternatively, but less likely, this may represent an ongoing fungal infection. Areas of interstitial thickening and ground-glass opacities within the right lower lobe, right middle lobe, and posterior segment of the right upper lobe are highly suspicious for satellite sites of malignancy. These could alternatively represent areas of interstitial hemorrhage. Borderline mediastinal lymphadenopathy. Persistent pericardial effusion or thickening.  Query metastatic. Oncology consult is recommended. These results will be called to the ordering clinician or representative by the Radiologist Assistant, and communication documented in the PACS or zVision Dashboard. Electronically Signed   By: DFidela SalisburyM.D.   On: 01/31/2015 15:49   Ct Angio Chest Pe W/cm &/or Wo Cm  01/18/2015  CLINICAL DATA:  Pt to ed with c/o right sided sharp chest pain that occurs when she coughs or takes a deep breath. Pt denies sob, denies radiation of pain, denies weakness, denies n/v. EXAM: CT ANGIOGRAPHY CHEST WITH CONTRAST TECHNIQUE: Multidetector CT imaging of the chest was performed using the standard protocol during bolus administration of intravenous contrast. Multiplanar CT image reconstructions and MIPs were obtained to evaluate the vascular anatomy.  CONTRAST:  840mOMNIPAQUE IOHEXOL 350 MG/ML SOLN COMPARISON:  11/13/2013 FINDINGS: Right arm IV contrast injection. The SVC is patent. RV/LV ratio less than 1. Satisfactory opacification of pulmonary arteries noted, and there is no evidence of pulmonary emboli. Patent pulmonary veins bilaterally. Adequate contrast opacification of the thoracic aorta with no evidence of dissection, aneurysm, or stenosis. There is classic 3-vessel brachiocephalic arch anatomy without proximal stenosis. Minimal plaque in the aortic  arch. Small pericardial effusion, new since prior study . No pleural effusion. No pneumothorax. Small subpleural blebs in the apices, and emphysematous changes in the upper lobes. There has been interval progression of masslike consolidation in the superior segment right lower lobe, 6 cm maximum transverse diameter, previously 4 cm, with the greater soft tissue component, persistent internal cystic air lucencies. Margins are poorly defined, with surrounding ground-glass opacities. There is a new smaller similar cavitary lesion anteriorly in the superior segment, measuring 14 mm image 76/7. There is a new similar poorly marginated area of consolidation with internal gas lucencies in the posterior right upper lobe 3.8 cm maximum transverse diameter image 59, and a separate poorly marginated sub solid focus in the anterior segment right upper lobe measuring approximately 2.5 cm, image 67/7. Minimal dependent atelectasis in the left lung without nodule or consolidation. Enlarged AP window lymph node 11 mm short axis diameter, previously 8 mm. Additional subcentimeter prevascular, AP window, precarinal, and right paratracheal lymph nodes. Right hilar lymph nodes measured up to 11 mm short axis diameter image 60/5. Unremarkable thoracic spine and sternum. Visualized portions of upper abdomen including adrenal glands unremarkable. Review of the MIP images confirms the above findings. IMPRESSION: 1. Negative for  acute PE or thoracic aortic dissection. 2. Multifocal consolidative and sub solid lesions in the right lung as detailed above. The largest has been present and has enlarged significantly since 11/13/2013 suggesting neoplasm more likely than infectious/inflammatory process. Further evaluation recommended. 3. Right hilar and mediastinal adenopathy as above. 4. Small pericardial effusion, new since prior study. Electronically Signed   By: Lucrezia Europe M.D.   On: 01/18/2015 12:49   Dg C-arm 1-60 Min-no Report  02/10/2015  CLINICAL DATA: ENB C-ARM 1-60 MINUTES Fluoroscopy was utilized by the requesting physician.  No radiographic interpretation.    ASSESSMENT:  Pulmonary infiltrate with middle lobe and lower lobe of the lung positive for adenocarcinoma  Subcarinal lymph node biopsy is also positive. We will proceed with PET scanning for complete staging as well as CT scan of brain Case was discussed in tumor conference. There may not be enough tissue for EGFR and other mutation possibility of liquid biopsy can be considered. We discussed possibility of chemotherapy chemoradiation therapy there is no evidence of extensive disease.  Patient also will be evaluated by surgical service as well as radiation Dr.  Adair Laundry of carboplatinum and Taxol or carbo platinum and Alimta depending on radiation physician's opinion and depending on the PET scan result  PLAN:   Discuss case in tumor conference I discussed situation with surgical service Dr.OAKES. Proceed with PET scanning and CT scan of brain Proceed with liquid biopsy Order EGFR Evaluate patient after PET scan in    Patient expressed understanding and was in agreement with this plan. She also understands that She can call clinic at any time with any questions, concerns, or complaints.    No matching staging information was found for the patient.  Forest Gleason, MD   02/13/2015 7:03 PM

## 2015-02-13 NOTE — Progress Notes (Signed)
Met with patient and husband at oncology follow up and diagnosis discussion. Will follow, patient to be in lung clinic next Thursday.

## 2015-02-19 ENCOUNTER — Telehealth: Payer: Self-pay | Admitting: *Deleted

## 2015-02-19 ENCOUNTER — Inpatient Hospital Stay: Payer: BLUE CROSS/BLUE SHIELD

## 2015-02-19 ENCOUNTER — Ambulatory Visit
Admission: RE | Admit: 2015-02-19 | Discharge: 2015-02-19 | Disposition: A | Discharge status: Home / Self Care | Payer: BLUE CROSS/BLUE SHIELD | Source: Ambulatory Visit | Attending: Oncology | Admitting: Oncology

## 2015-02-19 DIAGNOSIS — C3490 Malignant neoplasm of unspecified part of unspecified bronchus or lung: Secondary | ICD-10-CM

## 2015-02-19 DIAGNOSIS — C3491 Malignant neoplasm of unspecified part of right bronchus or lung: Secondary | ICD-10-CM | POA: Insufficient documentation

## 2015-02-19 MED ORDER — IOHEXOL 300 MG/ML  SOLN
75.0000 mL | Freq: Once | INTRAMUSCULAR | Status: AC | PRN
  Administered 2015-02-19: 75 mL via INTRAVENOUS

## 2015-02-19 NOTE — Telephone Encounter (Signed)
Patient notified that her appts will have to be rescheduled, Dr Genevive Bi was cancelled, Dr Jeb Levering to see her after PET is done on 20 th, and Dr Baruch Gouty appt moved to 2/22. Patient informed of this and repeated back back to me

## 2015-02-19 NOTE — Telephone Encounter (Signed)
Had ct this morning, but she had drank coffee too late and they did not do her PET scan. Asking if she can still keep her appts tomorrow ([place], [place], [place] ) Her PET is rescheduled for 2/20

## 2015-02-20 ENCOUNTER — Inpatient Hospital Stay: Payer: BLUE CROSS/BLUE SHIELD | Admitting: Oncology

## 2015-02-20 ENCOUNTER — Ambulatory Visit
Admission: RE | Admit: 2015-02-20 | Payer: BLUE CROSS/BLUE SHIELD | Source: Ambulatory Visit | Admitting: Radiation Oncology

## 2015-02-20 ENCOUNTER — Inpatient Hospital Stay: Payer: BLUE CROSS/BLUE SHIELD | Admitting: Cardiothoracic Surgery

## 2015-02-20 ENCOUNTER — Ambulatory Visit: Payer: BLUE CROSS/BLUE SHIELD | Admitting: Radiation Oncology

## 2015-02-24 ENCOUNTER — Encounter
Admission: RE | Admit: 2015-02-24 | Discharge: 2015-02-24 | Disposition: A | Discharge status: Home / Self Care | Payer: BLUE CROSS/BLUE SHIELD | Source: Ambulatory Visit | Attending: Oncology | Admitting: Oncology

## 2015-02-24 ENCOUNTER — Telehealth: Payer: Self-pay | Admitting: Physician Assistant

## 2015-02-24 ENCOUNTER — Inpatient Hospital Stay (HOSPITAL_BASED_OUTPATIENT_CLINIC_OR_DEPARTMENT_OTHER): Payer: BLUE CROSS/BLUE SHIELD | Admitting: Oncology

## 2015-02-24 ENCOUNTER — Encounter: Payer: Self-pay | Admitting: Oncology

## 2015-02-24 VITALS — BP 137/68 | HR 95 | Temp 96.9°F | Resp 18 | Wt 114.4 lb

## 2015-02-24 DIAGNOSIS — R590 Localized enlarged lymph nodes: Secondary | ICD-10-CM | POA: Diagnosis not present

## 2015-02-24 DIAGNOSIS — C3431 Malignant neoplasm of lower lobe, right bronchus or lung: Secondary | ICD-10-CM | POA: Diagnosis not present

## 2015-02-24 DIAGNOSIS — C779 Secondary and unspecified malignant neoplasm of lymph node, unspecified: Secondary | ICD-10-CM

## 2015-02-24 DIAGNOSIS — F418 Other specified anxiety disorders: Secondary | ICD-10-CM

## 2015-02-24 DIAGNOSIS — Z87891 Personal history of nicotine dependence: Secondary | ICD-10-CM

## 2015-02-24 DIAGNOSIS — C3490 Malignant neoplasm of unspecified part of unspecified bronchus or lung: Secondary | ICD-10-CM

## 2015-02-24 DIAGNOSIS — Z808 Family history of malignant neoplasm of other organs or systems: Secondary | ICD-10-CM

## 2015-02-24 DIAGNOSIS — Z8701 Personal history of pneumonia (recurrent): Secondary | ICD-10-CM

## 2015-02-24 DIAGNOSIS — I313 Pericardial effusion (noninflammatory): Secondary | ICD-10-CM

## 2015-02-24 DIAGNOSIS — C3481 Malignant neoplasm of overlapping sites of right bronchus and lung: Secondary | ICD-10-CM

## 2015-02-24 DIAGNOSIS — R918 Other nonspecific abnormal finding of lung field: Secondary | ICD-10-CM

## 2015-02-24 DIAGNOSIS — N323 Diverticulum of bladder: Secondary | ICD-10-CM

## 2015-02-24 LAB — GLUCOSE, CAPILLARY: Glucose-Capillary: 79 mg/dL (ref 65–99)

## 2015-02-24 MED ORDER — FLUDEOXYGLUCOSE F - 18 (FDG) INJECTION
12.3600 | Freq: Once | INTRAVENOUS | Status: AC | PRN
  Administered 2015-02-24: 12.36 via INTRAVENOUS

## 2015-02-24 NOTE — Telephone Encounter (Signed)
Pt needs refill on Buspirone '5mg'$   She uses [facility]

## 2015-02-24 NOTE — Progress Notes (Signed)
Patient here for PET results.

## 2015-02-24 NOTE — Progress Notes (Signed)
[place] @ [facility] [phone]  Fax:([phone]) [phone]   INITIAL CONSULT  Deanna Blair OB: 09/19/1955  MR#: [id number]  MOL#:[id number]  Patient Care Team: Mar Daring, PA-C as PCP - General (Family Medicine) Allyne Gee, MD as Referring Physician (Internal Medicine)  CHIEF COMPLAINT:  Chief Complaint  Patient presents with  . Lung Cancer   carcinoma of lung, adenocarcinoma T4 N2 M0 tumor stage III B diagnosis by bronchoscopy and subcarinal lymph node biopsy in February of 2017)  VISIT DIAGNOSIS:  Abnormal CT scan of chest  No history exists.      INTERVAL HISTORY:  60 year old gentleman lady who had a previous history of smoking for last 30 years patient quit smoking approximately a few years ago.  Member of 2015 patient presented to primary physician with cough was told that she has pneumonia was treated with antibiotic.  Had an abnormal CT scan with right lower lobe infiltrate.  In December 2015 patient started having sharp chest pain went to emergency room but a repeat CT scan revealed progressing right lower lobe pulmonary infiltrate.  Patient was seen by pulmonologists Dr. Humphrey Rolls  and after few rounds of antibiotic a repeat CT scan revealed progressing disease.  Patient was referred to me for further evaluation and treatment consideration extremly anxious and apprehensive lady. Has  a dry hacking cough.\No hemoptysis.  No chest pain. Patient underwent bronchoscopy and was found to have adenocarcinoma.  Carinal lymph node was also positive for adenocarcinoma The patient is here to discuss the results and further planning treatment  REVIEW OF SYSTEMS:   GENERAL:  Feels good.  Active.  No fevers, sweats or weight loss. PERFORMANCE STATUS (ECOG):  0 HEENT:  No visual changes, runny nose, sore throat, mouth sores or tenderness. Lungs: Increasing cough and shortness of breath Cardiac:  No chest pain, palpitations, orthopnea, or PND. GI:  No nausea,  vomiting, diarrhea, constipation, melena or hematochezia. GU:  No urgency, frequency, dysuria, or hematuria. Musculoskeletal:  No back pain.  No joint pain.  No muscle tenderness. Extremities:  No pain or swelling. Skin:  No rashes or skin changes. Neuro:  No headache, numbness or weakness, balance or coordination issues. Endocrine:  No diabetes, thyroid issues, hot flashes or night sweats. Psych:  No mood changes, depression he had patient's's family is slightly apprehensive and anxious Pain:  No focal pain. Review of systems:  All other systems reviewed and found to be negative.  As per HPI. Otherwise, a complete review of systems is negatve.  PAST MEDICAL HISTORY: Past Medical History  Diagnosis Date  . Depression with anxiety     Well controlled with Wellbutrin and Buspar  . Pneumonia   . Anxiety   . Depression   . Cancer of right lung ([place]) 02/13/2015    PAST SURGICAL HISTORY: Past Surgical History  Procedure Laterality Date  . Ectopic pregnancy surgery  1988  . Endobronchial ultrasound N/A 02/10/2015    Procedure: ENDOBRONCHIAL ULTRASOUND;  Surgeon: Flora Lipps, MD;  Location: [facility] ORS;  Service: Cardiopulmonary;  Laterality: N/A;  . Electromagnetic navigation brochoscopy N/A 02/10/2015    Procedure: ELECTROMAGNETIC NAVIGATION BRONCHOSCOPY;  Surgeon: Flora Lipps, MD;  Location: [facility] ORS;  Service: Cardiopulmonary;  Laterality: N/A;    FAMILY HISTORY Family History  Problem Relation Age of Onset  . Cancer Father     Prostate Cancer  . Hypertension Father   . Stroke Father   . Hypertension Brother     GYNECOLOGIC HISTORY:  No LMP recorded.  Patient is postmenopausal.     ADVANCED DIRECTIVES:  Patient does not have any living will or healthcare power of attorney.  Information was given .  Available resources had been discussed.  We will follow-up on subsequent appointments regarding this issue  HEALTH MAINTENANCE: Social History  Substance Use Topics  . Smoking  status: Former Smoker -- 0.25 packs/day    Types: Cigarettes    Quit date: 02/06/2005  . Smokeless tobacco: Never Used  . Alcohol Use: 3.0 oz/week    5 Cans of beer per week     Comment: 5 beers weekly     Colonoscopy:  PAP:  Bone density:  Lipid panel:  No Known Allergies  No current outpatient prescriptions on file.   No current facility-administered medications for this visit.    OBJECTIVE: PHYSICAL EXAM  General  status: Performance status is good.  Patient has not lost significant weight. Since last evaluation there is no significant change in the general status HEENT: No evidence of stomatitis. Sclera and conjunctivae :: No jaundice.   pale looking. Lungs: Air  entry equal on both sides.  Coarse crepitation in the right lower lobe.  Cardiac: Heart sounds are normal.  No pericardial rub.  No murmur. Lymphatic system: Cervical, axillary, inguinal, lymph nodes not palpable GI: Abdomen is soft.liver and spleen not palpable.  No ascites.  Bowel sounds are normal.  No other palpable masses.  No tenderness . Lower extremity: No edema Neurological system: Higher functions, cranial nerves intact no evidence of peripheral neuropathy. Skin: No rash.  No ecchymosis.. No petechial hemorrhages  Filed Vitals:   02/24/15 1118  BP: 137/68  Pulse: 95  Temp: 96.9 F (36.1 C)  Resp: 18     Body mass index is 20.92 kg/(m^2).    ECOG FS:0 - Asymptomatic  LAB RESULTS:  Hospital Outpatient Visit on 02/24/2015  Component Date Value Ref Range Status  . Glucose-Capillary 02/24/2015 79  65 - 99 mg/dL Final     STUDIES: Ct Head W Wo Contrast  02/19/2015  CLINICAL DATA:  60 year old female recently diagnosed with right lung cancer via bronchoscopic biopsy. Staging. Subsequent encounter. EXAM: CT HEAD WITHOUT AND WITH CONTRAST TECHNIQUE: Contiguous axial images were obtained from the base of the skull through the vertex without and with intravenous contrast CONTRAST:  93m OMNIPAQUE  IOHEXOL 300 MG/ML  SOLN COMPARISON:  Chest CT 01/31/2015 FINDINGS: Visualized paranasal sinuses and mastoids are clear. No osseous abnormality identified. Visualized orbits and scalp soft tissues are within normal limits. No midline shift, ventriculomegaly, mass effect, evidence of mass lesion, intracranial hemorrhage or evidence of cortically based acute infarction. Gray-white matter differentiation is within normal limits throughout the brain. No abnormal enhancement identified. Major intracranial vascular structures are enhancing. IMPRESSION: Negative CT appearance of the brain. No metastatic disease identified. Electronically Signed   By: HGenevie AnnM.D.   On: 02/19/2015 14:10   Ct Chest W Contrast  01/31/2015  CLINICAL DATA:  Follow-up of pneumonia. EXAM: CT CHEST WITH CONTRAST TECHNIQUE: Multidetector CT imaging of the chest was performed during intravenous contrast administration. CONTRAST:  765mOMNIPAQUE IOHEXOL 300 MG/ML  SOLN COMPARISON:  01/18/2015 FINDINGS: Mediastinum/Lymph Nodes: The heart is normal in size. There is persistent pericardial thickening versus effusion, stable in size. There are unchanged mediastinal lymph nodes, with the largest left AP window lymph node measuring 10 mm in short axis. The remainder of the bilateral prevascular, right peritracheal and precarinal lymph nodes are less than 1 cm in short axis. Lungs/Pleura:  There is persistent solid and sub solid posterior subpleural mass with extensive cystic degeneration in the superior segment of the right upper lobe measuring 3.8 by 6.2 by 6.3 cm. It abuts the posterior and medial pleura. Extensive surrounding ground-glass opacities are seen. There is a 6 mm posterior subpleural solid satellite nodule slightly more inferiorly in the right lower lobe. Additional areas of interstitial thickening and ground-glass opacities are seen in the basilar segment of right lower lobe, measuring approximately 5 cm, superior segment of the right upper  lobe, right middle lobe, and posterior segment of the right lower lobe, where there are 3 lesions measuring up to 4 cm. There is subpleural right apical scarring. There is no evidence of pleura effusion. The left lung is clear.  The airway is patent. Upper abdomen: No acute findings. Musculoskeletal: No chest wall mass or suspicious bone lesions identified. IMPRESSION: Persistent solids and sub solid superior segment of right lower lobe mass, highly suspicious for slow growing primary lung malignancy such as bronchoalveolar carcinoma. Alternatively, but less likely, this may represent an ongoing fungal infection. Areas of interstitial thickening and ground-glass opacities within the right lower lobe, right middle lobe, and posterior segment of the right upper lobe are highly suspicious for satellite sites of malignancy. These could alternatively represent areas of interstitial hemorrhage. Borderline mediastinal lymphadenopathy. Persistent pericardial effusion or thickening.  Query metastatic. Oncology consult is recommended. These results will be called to the ordering clinician or representative by the Radiologist Assistant, and communication documented in the PACS or zVision Dashboard. Electronically Signed   By: Fidela Salisbury M.D.   On: 01/31/2015 15:49   Nm Pet Image Initial (pi) Skull Base To Thigh  02/24/2015  CLINICAL DATA:  Initial treatment strategy for right lower lobe lung cancer. EXAM: NUCLEAR MEDICINE PET SKULL BASE TO THIGH TECHNIQUE: 12.4 mCi F-18 FDG was injected intravenously. Full-ring PET imaging was performed from the skull base to thigh after the radiotracer. CT data was obtained and used for attenuation correction and anatomic localization. FASTING BLOOD GLUCOSE:  Value: 79 mg/dl COMPARISON:  Multiple exams, including 01/31/2015 FINDINGS: NECK No hypermetabolic lymph nodes in the neck. CHEST Right upper lobe more peripheral region of ground-glass opacity has a maximum standard uptake  value of 3.0. The more solid/ cavitary portion of the left lower lobe opacity is hypermetabolic with a maximum standard uptake value of 3.2. Vague activity associated with the anterior right upper lobe opacity along the fissure, maximum standard uptake value in this vicinity 1.8. Faintly accentuated right hilar activity, maximum standard uptake value 3.1. Background mediastinal blood pool activity generally has a maximum standard uptake value of 2.4. Airway thickening in the right lung noted. Mild mosaic attenuation in the right lower lobe, as before. ABDOMEN/PELVIS No abnormal hypermetabolic activity within the liver, pancreas, adrenal glands, or spleen. No hypermetabolic lymph nodes in the abdomen or pelvis. Aortoiliac atherosclerotic vascular disease. Small right posterior bladder diverticulum. SKELETON No focal hypermetabolic activity to suggest skeletal metastasis. IMPRESSION: 1. Confluent left lower lobe opacity is hypermetabolic with maximum standard uptake value of 3.2. Mild hypermetabolic activity in right upper lobe ground-glass opacities, including the peripheral ground-glass opacity he which has a maximum standard uptake value of 3.0. Appearance suspicious for multifocal adenocarcinoma. The 2. Airway thickening in the right lung with mosaic attenuation in the lung bases likely at least partially due to air trapping and bronchiolitis. 3. No extra thoracic metastatic disease identified. 4.  Aortoiliac atherosclerotic vascular disease. Electronically Signed   By: Thayer Jew  Janeece Fitting M.D.   On: 02/24/2015 11:38   Dg C-arm 1-60 Min-no Report  02/10/2015  CLINICAL DATA: ENB C-ARM 1-60 MINUTES Fluoroscopy was utilized by the requesting physician.  No radiographic interpretation.    ASSESSMENT:  Pulmonary infiltrate with middle lobe and lower lobe of the lung positive for adenocarcinoma  Subcarinal lymph node biopsy is also positive. PET scan shows low activity in the right middle and lower lobe  Scars  that findings with the patient.  Brain scan is essentially normal without any evidence of metastases  PETscan has been reviewed independently reviewed with the patient. Discussed situation with Dr. Donella Stade and will discuss with Dr. Faith Rogue. Possibility of chemotherapy along with radiation therapy carboplatinum Taxol weekly and con current radiation therapy Versus possibility of surgical intervention now versus later date Also proceed with liquid biopsy to assess if EGFR or any other mutation available for [place] to target Patient does not have enough tissue to do any mutational study Duration of visit is 25 minutes and 50% of time was spent discussing VARIOUS  options   And cordinating  care with other physicians   Patient expressed understanding and was in agreement with this plan. She also understands that She can call clinic at any time with any questions, concerns, or complaints.    No matching staging information was found for the patient.  Forest Gleason, MD   02/24/2015 12:57 PM

## 2015-02-25 ENCOUNTER — Other Ambulatory Visit: Payer: Self-pay | Admitting: Physician Assistant

## 2015-02-26 ENCOUNTER — Ambulatory Visit
Admission: RE | Admit: 2015-02-26 | Discharge: 2015-02-26 | Disposition: A | Discharge status: Home / Self Care | Payer: BLUE CROSS/BLUE SHIELD | Source: Ambulatory Visit | Attending: Radiation Oncology | Admitting: Radiation Oncology

## 2015-02-26 ENCOUNTER — Inpatient Hospital Stay (HOSPITAL_BASED_OUTPATIENT_CLINIC_OR_DEPARTMENT_OTHER): Payer: BLUE CROSS/BLUE SHIELD | Admitting: Oncology

## 2015-02-26 ENCOUNTER — Encounter: Payer: Self-pay | Admitting: Radiation Oncology

## 2015-02-26 ENCOUNTER — Encounter: Payer: Self-pay | Admitting: Oncology

## 2015-02-26 VITALS — BP 138/98 | HR 79 | Temp 96.4°F | Resp 18 | Wt 118.4 lb

## 2015-02-26 VITALS — BP 129/79 | HR 77 | Temp 98.0°F | Wt 118.5 lb

## 2015-02-26 DIAGNOSIS — Z87891 Personal history of nicotine dependence: Secondary | ICD-10-CM

## 2015-02-26 DIAGNOSIS — C3481 Malignant neoplasm of overlapping sites of right bronchus and lung: Secondary | ICD-10-CM

## 2015-02-26 DIAGNOSIS — R590 Localized enlarged lymph nodes: Secondary | ICD-10-CM

## 2015-02-26 DIAGNOSIS — I313 Pericardial effusion (noninflammatory): Secondary | ICD-10-CM

## 2015-02-26 DIAGNOSIS — F418 Other specified anxiety disorders: Secondary | ICD-10-CM

## 2015-02-26 DIAGNOSIS — C779 Secondary and unspecified malignant neoplasm of lymph node, unspecified: Secondary | ICD-10-CM | POA: Diagnosis not present

## 2015-02-26 DIAGNOSIS — C3431 Malignant neoplasm of lower lobe, right bronchus or lung: Secondary | ICD-10-CM | POA: Diagnosis not present

## 2015-02-26 DIAGNOSIS — N323 Diverticulum of bladder: Secondary | ICD-10-CM

## 2015-02-26 DIAGNOSIS — Z808 Family history of malignant neoplasm of other organs or systems: Secondary | ICD-10-CM

## 2015-02-26 DIAGNOSIS — Z8701 Personal history of pneumonia (recurrent): Secondary | ICD-10-CM

## 2015-02-26 MED ORDER — CYANOCOBALAMIN 1000 MCG/ML IJ SOLN
1000.0000 ug | Freq: Once | INTRAMUSCULAR | Status: AC
  Administered 2015-02-26: 1000 ug via INTRAMUSCULAR

## 2015-02-26 MED ORDER — FOLIC ACID 1 MG PO TABS: 1.0000 mg | ORAL_TABLET | Freq: Every day | ORAL | Status: DC

## 2015-02-26 NOTE — Progress Notes (Signed)
Patient here as acute add on for MD to consult with her regarding chemotherapy treatment plan.

## 2015-02-26 NOTE — Progress Notes (Signed)
[place] @ [facility] Telephone:([phone]) [phone]  Fax:([phone]) [phone]   INITIAL CONSULT  Deanna Blair OB: 07-02-1955  MR#: [id number]  YYT#:[id number]  Patient Care Team: Mar Daring, PA-C as PCP - General (Family Medicine) Allyne Gee, MD as Referring Physician (Internal Medicine)  CHIEF COMPLAINT:  Chief Complaint  Patient presents with  . 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 2.  Start chemotherapy with carboplatinum and Alimta from March 8 ,2017  VISIT DIAGNOSIS:  Abnormal CT scan of chest  No history exists.      INTERVAL HISTORY:  60 year old gentleman lady who had a previous history of smoking for last 30 years patient quit smoking approximately a few years ago.  Member of 2015 patient presented to primary physician with cough was told that she has pneumonia was treated with antibiotic.  Had an abnormal CT scan with right lower lobe infiltrate.  In December 2015 patient started having sharp chest pain went to emergency room but a repeat CT scan revealed progressing right lower lobe pulmonary infiltrate.  Patient was seen by pulmonologists Dr. Humphrey Rolls  and after few rounds of antibiotic a repeat CT scan revealed progressing disease.  Patient was referred to me for further evaluation and treatment consideration extremly anxious and apprehensive lady. Has  a dry hacking cough.\No hemoptysis.  No chest pain. Patient underwent bronchoscopy and was found to have adenocarcinoma.  Carinal lymph node was also positive for adenocarcinoma The patient is here to discuss the results and further planning treatment  Patient has been seen by Dr. Donella Stade and agreed on a plan of doing induction chemotherapy for 2-3 cycles and reevaluate patient.  Dr. Donella Stade agrees that going initially with radiation therapy U he may not able to encompass the entire disease  as this lower lobe and upper and middle lobes involved on the right side.  And needs mediastinal lymph node So hopefully after 2 or 3 cycles of chemotherapy volumeofradiationcanbedecreased.  REVIEW OF SYSTEMS:   GENERAL:  Feels good.  Active.  No fevers, sweats or weight loss. PERFORMANCE STATUS (ECOG):  0 HEENT:  No visual changes, runny nose, sore throat, mouth sores or tenderness. Lungs: Increasing cough and shortness of breath Cardiac:  No chest pain, palpitations, orthopnea, or PND. GI:  No nausea, vomiting, diarrhea, constipation, melena or hematochezia. GU:  No urgency, frequency, dysuria, or hematuria. Musculoskeletal:  No back pain.  No joint pain.  No muscle tenderness. Extremities:  No pain or swelling. Skin:  No rashes or skin changes. Neuro:  No headache, numbness or weakness, balance or coordination issues. Endocrine:  No diabetes, thyroid issues, hot flashes or night sweats. Psych:  No mood changes, depression he had patient's's family is slightly apprehensive and anxious Pain:  No focal pain. Review of systems:  All other systems reviewed and found to be negative.  As per HPI. Otherwise, a complete review of systems is negatve.  PAST MEDICAL HISTORY: Past Medical History  Diagnosis Date  . Depression with anxiety     Well controlled with Wellbutrin and Buspar  . Pneumonia   . Anxiety   . Depression   . Cancer of right lung ([place]) 02/13/2015    PAST SURGICAL HISTORY: Past Surgical History  Procedure Laterality Date  . Ectopic pregnancy surgery  1988  . Endobronchial ultrasound N/A 02/10/2015    Procedure: ENDOBRONCHIAL ULTRASOUND;  Surgeon: Flora Lipps,  MD;  Location: [facility] ORS;  Service: Cardiopulmonary;  Laterality: N/A;  . Electromagnetic navigation brochoscopy N/A 02/10/2015    Procedure: ELECTROMAGNETIC NAVIGATION BRONCHOSCOPY;  Surgeon: Kurian Kasa, MD;  Location: [facility] ORS;  Service: Cardiopulmonary;  Laterality: N/A;    FAMILY HISTORY Family History  Problem  Relation Age of Onset  . Cancer Father     Prostate Cancer  . Hypertension Father   . Stroke Father   . Hypertension Brother     GYNECOLOGIC HISTORY:  No LMP recorded. Patient is postmenopausal.     ADVANCED DIRECTIVES:  Patient does not have any living will or healthcare power of attorney.  Information was given .  Available resources had been discussed.  We will follow-up on subsequent appointments regarding this issue  HEALTH MAINTENANCE: Social History  Substance Use Topics  . Smoking status: Former Smoker -- 0.25 packs/day    Types: Cigarettes    Quit date: 02/06/2005  . Smokeless tobacco: Never Used  . Alcohol Use: 3.0 oz/week    5 Cans of beer per week     Comment: 5 beers weekly     Colonoscopy:  PAP:  Bone density:  Lipid panel:  No Known Allergies  Current Outpatient Prescriptions  Medication Sig Dispense Refill  . busPIRone (BUSPAR) 5 MG tablet TAKE ONE TABLET BY MOUTH TWICE DAILY 60 tablet 6  . folic acid (FOLVITE) 1 MG tablet Take 1 tablet (1 mg total) by mouth daily. 30 tablet 3   Current Facility-Administered Medications  Medication Dose Route Frequency Provider Last Rate Last Dose  . cyanocobalamin ((VITAMIN B-12)) injection 1,000 mcg  1,000 mcg Intramuscular Once Janak Choksi, MD        OBJECTIVE: PHYSICAL EXAM  General  status: Performance status is good.  Patient has not lost significant weight. Since last evaluation there is no significant change in the general status HEENT: No evidence of stomatitis. Sclera and conjunctivae :: No jaundice.   pale looking. Lungs: Air  entry equal on both sides.  Coarse crepitation in the right lower lobe.  Cardiac: Heart sounds are normal.  No pericardial rub.  No murmur. Lymphatic system: Cervical, axillary, inguinal, lymph nodes not palpable GI: Abdomen is soft.liver and spleen not palpable.  No ascites.  Bowel sounds are normal.  No other palpable masses.  No tenderness . Lower extremity: No  edema Neurological system: Higher functions, cranial nerves intact no evidence of peripheral neuropathy. Skin: No rash.  No ecchymosis.. No petechial hemorrhages  Filed Vitals:   02/26/15 1448  BP: 138/98  Pulse: 79  Temp: 96.4 F (35.8 C)  Resp: 18     Body mass index is 21.65 kg/(m^2).    ECOG FS:0 - Asymptomatic  LAB RESULTS:  Hospital Outpatient Visit on 02/24/2015  Component Date Value Ref Range Status  . Glucose-Capillary 02/24/2015 79  65 - 99 mg/dL Final     STUDIES: Ct Head W Wo Contrast  02/19/2015  CLINICAL DATA:  59-year-old female recently diagnosed with right lung cancer via bronchoscopic biopsy. Staging. Subsequent encounter. EXAM: CT HEAD WITHOUT AND WITH CONTRAST TECHNIQUE: Contiguous axial images were obtained from the base of the skull through the vertex without and with intravenous contrast CONTRAST:  75mL OMNIPAQUE IOHEXOL 300 MG/ML  SOLN COMPARISON:  Chest CT 01/31/2015 FINDINGS: Visualized paranasal sinuses and mastoids are clear. No osseous abnormality identified. Visualized orbits and scalp soft tissues are within normal limits. No midline shift, ventriculomegaly, mass effect, evidence of mass lesion, intracranial hemorrhage or evidence of cortically based   acute infarction. Gray-white matter differentiation is within normal limits throughout the brain. No abnormal enhancement identified. Major intracranial vascular structures are enhancing. IMPRESSION: Negative CT appearance of the brain. No metastatic disease identified. Electronically Signed   By: H  Hall M.D.   On: 02/19/2015 14:10   Ct Chest W Contrast  01/31/2015  CLINICAL DATA:  Follow-up of pneumonia. EXAM: CT CHEST WITH CONTRAST TECHNIQUE: Multidetector CT imaging of the chest was performed during intravenous contrast administration. CONTRAST:  75mL OMNIPAQUE IOHEXOL 300 MG/ML  SOLN COMPARISON:  01/18/2015 FINDINGS: Mediastinum/Lymph Nodes: The heart is normal in size. There is persistent pericardial  thickening versus effusion, stable in size. There are unchanged mediastinal lymph nodes, with the largest left AP window lymph node measuring 10 mm in short axis. The remainder of the bilateral prevascular, right peritracheal and precarinal lymph nodes are less than 1 cm in short axis. Lungs/Pleura: There is persistent solid and sub solid posterior subpleural mass with extensive cystic degeneration in the superior segment of the right upper lobe measuring 3.8 by 6.2 by 6.3 cm. It abuts the posterior and medial pleura. Extensive surrounding ground-glass opacities are seen. There is a 6 mm posterior subpleural solid satellite nodule slightly more inferiorly in the right lower lobe. Additional areas of interstitial thickening and ground-glass opacities are seen in the basilar segment of right lower lobe, measuring approximately 5 cm, superior segment of the right upper lobe, right middle lobe, and posterior segment of the right lower lobe, where there are 3 lesions measuring up to 4 cm. There is subpleural right apical scarring. There is no evidence of pleura effusion. The left lung is clear.  The airway is patent. Upper abdomen: No acute findings. Musculoskeletal: No chest wall mass or suspicious bone lesions identified. IMPRESSION: Persistent solids and sub solid superior segment of right lower lobe mass, highly suspicious for slow growing primary lung malignancy such as bronchoalveolar carcinoma. Alternatively, but less likely, this may represent an ongoing fungal infection. Areas of interstitial thickening and ground-glass opacities within the right lower lobe, right middle lobe, and posterior segment of the right upper lobe are highly suspicious for satellite sites of malignancy. These could alternatively represent areas of interstitial hemorrhage. Borderline mediastinal lymphadenopathy. Persistent pericardial effusion or thickening.  Query metastatic. Oncology consult is recommended. These results will be called  to the ordering clinician or representative by the Radiologist Assistant, and communication documented in the PACS or zVision Dashboard. Electronically Signed   By: Dobrinka  Dimitrova M.D.   On: 01/31/2015 15:49   Nm Pet Image Initial (pi) Skull Base To Thigh  02/24/2015  CLINICAL DATA:  Initial treatment strategy for right lower lobe lung cancer. EXAM: NUCLEAR MEDICINE PET SKULL BASE TO THIGH TECHNIQUE: 12.4 mCi F-18 FDG was injected intravenously. Full-ring PET imaging was performed from the skull base to thigh after the radiotracer. CT data was obtained and used for attenuation correction and anatomic localization. FASTING BLOOD GLUCOSE:  Value: 79 mg/dl COMPARISON:  Multiple exams, including 01/31/2015 FINDINGS: NECK No hypermetabolic lymph nodes in the neck. CHEST Right upper lobe more peripheral region of ground-glass opacity has a maximum standard uptake value of 3.0. The more solid/ cavitary portion of the left lower lobe opacity is hypermetabolic with a maximum standard uptake value of 3.2. Vague activity associated with the anterior right upper lobe opacity along the fissure, maximum standard uptake value in this vicinity 1.8. Faintly accentuated right hilar activity, maximum standard uptake value 3.1. Background mediastinal blood pool activity generally has a maximum   standard uptake value of 2.4. Airway thickening in the right lung noted. Mild mosaic attenuation in the right lower lobe, as before. ABDOMEN/PELVIS No abnormal hypermetabolic activity within the liver, pancreas, adrenal glands, or spleen. No hypermetabolic lymph nodes in the abdomen or pelvis. Aortoiliac atherosclerotic vascular disease. Small right posterior bladder diverticulum. SKELETON No focal hypermetabolic activity to suggest skeletal metastasis. IMPRESSION: 1. Confluent left lower lobe opacity is hypermetabolic with maximum standard uptake value of 3.2. Mild hypermetabolic activity in right upper lobe ground-glass opacities,  including the peripheral ground-glass opacity he which has a maximum standard uptake value of 3.0. Appearance suspicious for multifocal adenocarcinoma. The 2. Airway thickening in the right lung with mosaic attenuation in the lung bases likely at least partially due to air trapping and bronchiolitis. 3. No extra thoracic metastatic disease identified. 4.  Aortoiliac atherosclerotic vascular disease. Electronically Signed   By: Walter  Liebkemann M.D.   On: 02/24/2015 11:38   Dg C-arm 1-60 Min-no Report  02/10/2015  CLINICAL DATA: ENB C-ARM 1-60 MINUTES Fluoroscopy was utilized by the requesting physician.  No radiographic interpretation.    ASSESSMENT:  Pulmonary infiltrate with middle lobe and lower lobe of the lung positive for adenocarcinoma  Subcarinal lymph node biopsy is also positive. PET scan shows low activity in the right middle and lower lobe  Scars that findings with the patient.  Brain scan is essentially normal without any evidence of metastases  PETscan has been reviewed independently reviewed with the patient. Discussed situation with Dr. Crystal and will discuss with Dr. Oakes. Possibility of chemotherapy along with radiation therapy carboplatinum Taxol weekly and con current radiation therapy Versus possibility of surgical intervention now versus later date Also proceed with liquid biopsy to assess if EGFR or any other mutation available for us to target Patient does not have enough tissue to do any mutational study Duration of visit is 25 minutes and 50% of time was spent discussing VARIOUS  options   And cordinating  care with other physicians Liquid biopsy not available.  At present time and proceed with port placement Processes has been explained to the patient and her husband.  We will proceed with carboplatinum and Alimta as patient is a strong desire to save hair.  And continued to work.  Vitamin B12 injection and folic acid would be given. She will let an chemotherapy  class  Patient expressed understanding and was in agreement with this plan. She also understands that She can call clinic at any time with any questions, concerns, or complaints.    No matching staging information was found for the patient.  Janak Choksi, MD   02/26/2015 2:59 PM   

## 2015-02-26 NOTE — Progress Notes (Signed)
Written and verbal education given to the patient and her husband.  Questions answered to their satisfaction. Spent 10 minutes with patient

## 2015-02-26 NOTE — Consult Note (Signed)
Except an outstanding is perfect of Radiation Oncology NEW PATIENT EVALUATION  Name: Deanna Blair  MRN: [id number]  Date:   02/26/2015     DOB: [birth year]   This 60 y.o. female patient presents to the clinic for initial evaluation of stage IIIB (T4 N2 M0) adenocarcinoma of the right lung.  REFERRING PHYSICIAN: Mar Daring, [place]*  CHIEF COMPLAINT:  Chief Complaint  Patient presents with  . Lung Cancer    Initial evaluation of rad treatment for new diagnosed lung cancer    DIAGNOSIS: The encounter diagnosis was Malignant neoplasm of overlapping sites of right lung ([place]).   PREVIOUS INVESTIGATIONS:  PET CT and CT scans reviewed Cytology pathology reports reviewed Clinical notes reviewed  HPI: Patient is a 60 year old female 50-pack-year smoking history quit smoking few years prior presented in 2015 with chronic cough treated with antibiotic therapy with CT scan demonstrating a subtle right lower lobe infiltrate. She was treated empirically with antibiotic therapy although CT scan show progressive disease. CT scan performed 01/31/2015 showed borderline mediastinal lymphadenopathy with persistent solid and sub-solid. Segment of right lower lobe mass suspicious for primary lung carcinoma. Patient underwent bronchoscopy and endobronchial ultrasound. Paratracheal lymph node was seen and biopsied and positive for metastatic adenocarcinoma. PET CT scan demonstrated hypermetabolic activity in the right upper lobe and right lower lobe. Patient today is seen for radiation oncology opinion. She is doing fairly well still has a mild cough no hemoptysis. No marked shortness of breath.  PLANNED TREATMENT REGIMEN: Possible upfront chemotherapy followed by concurrent chemoradiation  PAST MEDICAL HISTORY:  has a past medical history of Depression with anxiety; Pneumonia; Anxiety; Depression; and Cancer of right lung ([place]) (02/13/2015).    PAST SURGICAL HISTORY:  Past Surgical History   Procedure Laterality Date  . Ectopic pregnancy surgery  1988  . Endobronchial ultrasound N/A 02/10/2015    Procedure: ENDOBRONCHIAL ULTRASOUND;  Surgeon: Flora Lipps, MD;  Location: [facility] ORS;  Service: Cardiopulmonary;  Laterality: N/A;  . Electromagnetic navigation brochoscopy N/A 02/10/2015    Procedure: ELECTROMAGNETIC NAVIGATION BRONCHOSCOPY;  Surgeon: Flora Lipps, MD;  Location: [facility] ORS;  Service: Cardiopulmonary;  Laterality: N/A;    FAMILY HISTORY: family history includes Cancer in her father; Hypertension in her brother and father; Stroke in her father.  SOCIAL HISTORY:  reports that she quit smoking about 10 years ago. Her smoking use included Cigarettes. She smoked 0.25 packs per day. She has never used smokeless tobacco. She reports that she drinks about 3.0 oz of alcohol per week. She reports that she does not use illicit drugs.  ALLERGIES: Review of patient's allergies indicates no known allergies.  MEDICATIONS:  Current Outpatient Prescriptions  Medication Sig Dispense Refill  . busPIRone (BUSPAR) 5 MG tablet TAKE ONE TABLET BY MOUTH TWICE DAILY 60 tablet 6   No current facility-administered medications for this encounter.    ECOG PERFORMANCE STATUS:  1 - Symptomatic but completely ambulatory  REVIEW OF SYSTEMS:  Patient denies any weight loss, fatigue, weakness, fever, chills or night sweats. Patient denies any loss of vision, blurred vision. Patient denies any ringing  of the ears or hearing loss. No irregular heartbeat. Patient denies heart murmur or history of fainting. Patient denies any chest pain or pain radiating to her upper extremities. Patient denies any shortness of breath, difficulty breathing at night, cough or hemoptysis. Patient denies any swelling in the lower legs. Patient denies any nausea vomiting, vomiting of blood, or coffee ground material in the vomitus. Patient denies any stomach pain.  Patient states has had normal bowel movements no significant  constipation or diarrhea. Patient denies any dysuria, hematuria or significant nocturia. Patient denies any problems walking, swelling in the joints or loss of balance. Patient denies any skin changes, loss of hair or loss of weight. Patient denies any excessive worrying or anxiety or significant depression. Patient denies any problems with insomnia. Patient denies excessive thirst, polyuria, polydipsia. Patient denies any swollen glands, patient denies easy bruising or easy bleeding. Patient denies any recent infections, allergies or URI. Patient "s visual fields have not changed significantly in recent time.    PHYSICAL EXAM: BP 129/79 mmHg  Pulse 77  Temp(Src) 98 F (36.7 C)  Wt 118 lb 8 oz (53.75 kg) Well-developed thin female in NAD no cervical or supraclavicular adenopathy is identified. Well-developed well-nourished patient in NAD. HEENT reveals PERLA, EOMI, discs not visualized.  Oral cavity is clear. No oral mucosal lesions are identified. Neck is clear without evidence of cervical or supraclavicular adenopathy. Lungs are clear to A&P. Cardiac examination is essentially unremarkable with regular rate and rhythm without murmur rub or thrill. Abdomen is benign with no organomegaly or masses noted. Motor sensory and DTR levels are equal and symmetric in the upper and lower extremities. Cranial nerves II through XII are grossly intact. Proprioception is intact. No peripheral adenopathy or edema is identified. No motor or sensory levels are noted. Crude visual fields are within normal range.  LABORATORY DATA: Pathology and cytology reports reviewed    RADIOLOGY RESULTS: CT scans and PET/CT scan reviewed   IMPRESSION: Stage IIIB adenocarcinoma the right lung in 60 year old female. There are 2 separate distinct areas of groundglass opacity and hypermetabolic activity in the right lung fields. Also hypermetabolic activity in the mediastinum.  PLAN: I discussed the case personally with medical  oncology. To his all areas of hypermetabolic activity in the right lung would be extremely large area of radiation. Would favor upfront chemotherapy repeating her PET CT scan for more definitive determination of treatment volumes. Would target remaining area of hypermetabolic activity as well as mediastinal adenopathy up to 6000 cGy with concurrent chemotherapy. Risks and benefits of treatment including fatigue alteration of blood counts cough possible dysphasia secondary radiation esophagitis skin reaction and dysphasia possibly secondary to radiation esophagitis all were discussed in detail with the patient and her husband. Patient will see medical oncology today to begin chemotherapy. Will reevaluate her after 2 cycles.  I would like to take this opportunity for allowing me to participate in the care of your patient.Armstead Peaks., MD

## 2015-02-26 NOTE — Progress Notes (Unsigned)
PSN met with patient today to discuss financial concerns.  PSN advised patient on how to apply for the financial hardship program through [facility].

## 2015-02-28 NOTE — Patient Instructions (Signed)
Pemetrexed injection What is this medicine? PEMETREXED (PEM e TREX ed) is a chemotherapy drug. This medicine affects cells that are rapidly growing, such as cancer cells and cells in your mouth and stomach. It is usually used to treat lung cancers like non-small cell lung cancer and mesothelioma. It may also be used to treat other cancers. This medicine may be used for other purposes; ask your health care provider or pharmacist if you have questions. What should I tell my health care provider before I take this medicine? They need to know if you have any of these conditions: -if you frequently drink alcohol containing beverages -infection (especially a virus infection such as chickenpox, cold sores, or herpes) -kidney disease -liver disease -low blood counts, like low platelets, red bloods, or white blood cells -an unusual or allergic reaction to pemetrexed, mannitol, other medicines, foods, dyes, or preservatives -pregnant or trying to get pregnant -breast-feeding How should I use this medicine? This drug is given as an infusion into a vein. It is administered in a hospital or clinic by a specially trained health care professional. Talk to your pediatrician regarding the use of this medicine in children. Special care may be needed. Overdosage: If you think you have taken too much of this medicine contact a poison control center or emergency room at once. NOTE: This medicine is only for you. Do not share this medicine with others. What if I miss a dose? It is important not to miss your dose. Call your doctor or health care professional if you are unable to keep an appointment. What may interact with this medicine? -aspirin and aspirin-like medicines -medicines to increase blood counts like filgrastim, pegfilgrastim, sargramostim -methotrexate -NSAIDS, medicines for pain and inflammation, like ibuprofen or naproxen -probenecid -pyrimethamine -vaccines Talk to your doctor or health care  professional before taking any of these medicines: -acetaminophen -aspirin -ibuprofen -ketoprofen -naproxen This list may not describe all possible interactions. Give your health care provider a list of all the medicines, herbs, non-prescription drugs, or dietary supplements you use. Also tell them if you smoke, drink alcohol, or use illegal drugs. Some items may interact with your medicine. What should I watch for while using this medicine? Visit your doctor for checks on your progress. This drug may make you feel generally unwell. This is not uncommon, as chemotherapy can affect healthy cells as well as cancer cells. Report any side effects. Continue your course of treatment even though you feel ill unless your doctor tells you to stop. In some cases, you may be given additional medicines to help with side effects. Follow all directions for their use. Call your doctor or health care professional for advice if you get a fever, chills or sore throat, or other symptoms of a cold or flu. Do not treat yourself. This drug decreases your body's ability to fight infections. Try to avoid being around people who are sick. This medicine may increase your risk to bruise or bleed. Call your doctor or health care professional if you notice any unusual bleeding. Be careful brushing and flossing your teeth or using a toothpick because you may get an infection or bleed more easily. If you have any dental work done, tell your dentist you are receiving this medicine. Avoid taking products that contain aspirin, acetaminophen, ibuprofen, naproxen, or ketoprofen unless instructed by your doctor. These medicines may hide a fever. Call your doctor or health care professional if you get diarrhea or mouth sores. Do not treat yourself. To protect your  kidneys, drink water or other fluids as directed while you are taking this medicine. Men and women must use effective birth control while taking this medicine. You may also  need to continue using effective birth control for a time after stopping this medicine. Do not become pregnant while taking this medicine. Tell your doctor right away if you think that you or your partner might be pregnant. There is a potential for serious side effects to an unborn child. Talk to your health care professional or pharmacist for more information. Do not breast-feed an infant while taking this medicine. This medicine may lower sperm counts. What side effects may I notice from receiving this medicine? Side effects that you should report to your doctor or health care professional as soon as possible: -allergic reactions like skin rash, itching or hives, swelling of the face, lips, or tongue -low blood counts - this medicine may decrease the number of white blood cells, red blood cells and platelets. You may be at increased risk for infections and bleeding. -signs of infection - fever or chills, cough, sore throat, pain or difficulty passing urine -signs of decreased platelets or bleeding - bruising, pinpoint red spots on the skin, black, tarry stools, blood in the urine -signs of decreased red blood cells - unusually weak or tired, fainting spells, lightheadedness -breathing problems, like a dry cough -changes in emotions or moods -chest pain -confusion -diarrhea -high blood pressure -mouth or throat sores or ulcers -pain, swelling, warmth in the leg -pain on swallowing -swelling of the ankles, feet, hands -trouble passing urine or change in the amount of urine -vomiting -yellowing of the eyes or skin Side effects that usually do not require medical attention (report to your doctor or health care professional if they continue or are bothersome): -hair loss -loss of appetite -nausea -stomach upset This list may not describe all possible side effects. Call your doctor for medical advice about side effects. You may report side effects to FDA at 1-800-FDA-1088. Where should I keep  my medicine? This drug is given in a hospital or clinic and will not be stored at home. NOTE: This sheet is a summary. It may not cover all possible information. If you have questions about this medicine, talk to your doctor, pharmacist, or health care provider.    2016, Elsevier/Gold Standard. (2007-07-25 13:24:03) Carboplatin injection What is this medicine? CARBOPLATIN (KAR boe pla tin) is a chemotherapy drug. It targets fast dividing cells, like cancer cells, and causes these cells to die. This medicine is used to treat ovarian cancer and many other cancers. This medicine may be used for other purposes; ask your health care provider or pharmacist if you have questions. What should I tell my health care provider before I take this medicine? They need to know if you have any of these conditions: -blood disorders -hearing problems -kidney disease -recent or ongoing radiation therapy -an unusual or allergic reaction to carboplatin, cisplatin, other chemotherapy, other medicines, foods, dyes, or preservatives -pregnant or trying to get pregnant -breast-feeding How should I use this medicine? This drug is usually given as an infusion into a vein. It is administered in a hospital or clinic by a specially trained health care professional. Talk to your pediatrician regarding the use of this medicine in children. Special care may be needed. Overdosage: If you think you have taken too much of this medicine contact a poison control center or emergency room at once. NOTE: This medicine is only for you. Do not share  this medicine with others. What if I miss a dose? It is important not to miss a dose. Call your doctor or health care professional if you are unable to keep an appointment. What may interact with this medicine? -medicines for seizures -medicines to increase blood counts like filgrastim, pegfilgrastim, sargramostim -some antibiotics like amikacin, gentamicin, neomycin, streptomycin,  tobramycin -vaccines Talk to your doctor or health care professional before taking any of these medicines: -acetaminophen -aspirin -ibuprofen -ketoprofen -naproxen This list may not describe all possible interactions. Give your health care provider a list of all the medicines, herbs, non-prescription drugs, or dietary supplements you use. Also tell them if you smoke, drink alcohol, or use illegal drugs. Some items may interact with your medicine. What should I watch for while using this medicine? Your condition will be monitored carefully while you are receiving this medicine. You will need important blood work done while you are taking this medicine. This drug may make you feel generally unwell. This is not uncommon, as chemotherapy can affect healthy cells as well as cancer cells. Report any side effects. Continue your course of treatment even though you feel ill unless your doctor tells you to stop. In some cases, you may be given additional medicines to help with side effects. Follow all directions for their use. Call your doctor or health care professional for advice if you get a fever, chills or sore throat, or other symptoms of a cold or flu. Do not treat yourself. This drug decreases your body's ability to fight infections. Try to avoid being around people who are sick. This medicine may increase your risk to bruise or bleed. Call your doctor or health care professional if you notice any unusual bleeding. Be careful brushing and flossing your teeth or using a toothpick because you may get an infection or bleed more easily. If you have any dental work done, tell your dentist you are receiving this medicine. Avoid taking products that contain aspirin, acetaminophen, ibuprofen, naproxen, or ketoprofen unless instructed by your doctor. These medicines may hide a fever. Do not become pregnant while taking this medicine. Women should inform their doctor if they wish to become pregnant or think  they might be pregnant. There is a potential for serious side effects to an unborn child. Talk to your health care professional or pharmacist for more information. Do not breast-feed an infant while taking this medicine. What side effects may I notice from receiving this medicine? Side effects that you should report to your doctor or health care professional as soon as possible: -allergic reactions like skin rash, itching or hives, swelling of the face, lips, or tongue -signs of infection - fever or chills, cough, sore throat, pain or difficulty passing urine -signs of decreased platelets or bleeding - bruising, pinpoint red spots on the skin, black, tarry stools, nosebleeds -signs of decreased red blood cells - unusually weak or tired, fainting spells, lightheadedness -breathing problems -changes in hearing -changes in vision -chest pain -high blood pressure -low blood counts - This drug may decrease the number of white blood cells, red blood cells and platelets. You may be at increased risk for infections and bleeding. -nausea and vomiting -pain, swelling, redness or irritation at the injection site -pain, tingling, numbness in the hands or feet -problems with balance, talking, walking -trouble passing urine or change in the amount of urine Side effects that usually do not require medical attention (report to your doctor or health care professional if they continue or are bothersome): -  hair loss -loss of appetite -metallic taste in the mouth or changes in taste This list may not describe all possible side effects. Call your doctor for medical advice about side effects. You may report side effects to FDA at 1-800-FDA-1088. Where should I keep my medicine? This drug is given in a hospital or clinic and will not be stored at home. NOTE: This sheet is a summary. It may not cover all possible information. If you have questions about this medicine, talk to your doctor, pharmacist, or health care  provider.    2016, Elsevier/Gold Standard. (2007-03-28 14:38:05)

## 2015-03-03 ENCOUNTER — Other Ambulatory Visit: Payer: Self-pay | Admitting: Vascular Surgery

## 2015-03-05 ENCOUNTER — Encounter
Admission: RE | Disposition: A | Discharge status: Home / Self Care | Payer: Self-pay | Source: Ambulatory Visit | Attending: Vascular Surgery

## 2015-03-05 ENCOUNTER — Encounter: Payer: Self-pay | Admitting: *Deleted

## 2015-03-05 ENCOUNTER — Ambulatory Visit
Admission: RE | Admit: 2015-03-05 | Discharge: 2015-03-05 | Disposition: A | Discharge status: Home / Self Care | Payer: BLUE CROSS/BLUE SHIELD | Source: Ambulatory Visit | Attending: Vascular Surgery | Admitting: Vascular Surgery

## 2015-03-05 DIAGNOSIS — Z8042 Family history of malignant neoplasm of prostate: Secondary | ICD-10-CM | POA: Insufficient documentation

## 2015-03-05 DIAGNOSIS — R05 Cough: Secondary | ICD-10-CM | POA: Insufficient documentation

## 2015-03-05 DIAGNOSIS — F329 Major depressive disorder, single episode, unspecified: Secondary | ICD-10-CM | POA: Insufficient documentation

## 2015-03-05 DIAGNOSIS — Z87891 Personal history of nicotine dependence: Secondary | ICD-10-CM | POA: Diagnosis not present

## 2015-03-05 DIAGNOSIS — Z79899 Other long term (current) drug therapy: Secondary | ICD-10-CM | POA: Insufficient documentation

## 2015-03-05 DIAGNOSIS — C3481 Malignant neoplasm of overlapping sites of right bronchus and lung: Secondary | ICD-10-CM | POA: Diagnosis not present

## 2015-03-05 DIAGNOSIS — F418 Other specified anxiety disorders: Secondary | ICD-10-CM | POA: Diagnosis not present

## 2015-03-05 DIAGNOSIS — Z8249 Family history of ischemic heart disease and other diseases of the circulatory system: Secondary | ICD-10-CM | POA: Diagnosis not present

## 2015-03-05 DIAGNOSIS — Z823 Family history of stroke: Secondary | ICD-10-CM | POA: Diagnosis not present

## 2015-03-05 DIAGNOSIS — F419 Anxiety disorder, unspecified: Secondary | ICD-10-CM | POA: Insufficient documentation

## 2015-03-05 DIAGNOSIS — C3491 Malignant neoplasm of unspecified part of right bronchus or lung: Secondary | ICD-10-CM | POA: Diagnosis present

## 2015-03-05 DIAGNOSIS — Z809 Family history of malignant neoplasm, unspecified: Secondary | ICD-10-CM | POA: Insufficient documentation

## 2015-03-05 HISTORY — PX: PERIPHERAL VASCULAR CATHETERIZATION: SHX172C

## 2015-03-05 SURGERY — PORTA CATH INSERTION
Anesthesia: Moderate Sedation

## 2015-03-05 MED ORDER — MIDAZOLAM HCL 2 MG/2ML IJ SOLN
INTRAMUSCULAR | Status: DC | PRN
Start: 2015-03-05 — End: 2015-03-05
  Administered 2015-03-05 (×3): 1 mg via INTRAVENOUS

## 2015-03-05 MED ORDER — FENTANYL CITRATE (PF) 100 MCG/2ML IJ SOLN
INTRAMUSCULAR | Status: DC | PRN
  Administered 2015-03-05 (×3): 25 ug via INTRAVENOUS

## 2015-03-05 MED ORDER — LIDOCAINE-EPINEPHRINE (PF) 1 %-1:200000 IJ SOLN
INTRAMUSCULAR | Status: AC
  Filled 2015-03-05: qty 30

## 2015-03-05 MED ORDER — SODIUM CHLORIDE 0.9 % IV SOLN: INTRAVENOUS | Status: DC

## 2015-03-05 MED ORDER — MIDAZOLAM HCL 2 MG/2ML IJ SOLN
INTRAMUSCULAR | Status: AC
  Filled 2015-03-05: qty 2

## 2015-03-05 MED ORDER — DEXTROSE 5 % IV SOLN: 1.5000 g | INTRAVENOUS | Status: DC

## 2015-03-05 MED ORDER — HEPARIN (PORCINE) IN NACL 2-0.9 UNIT/ML-% IJ SOLN
INTRAMUSCULAR | Status: AC
  Filled 2015-03-05: qty 500

## 2015-03-05 MED ORDER — HYDROMORPHONE HCL 1 MG/ML IJ SOLN: 1.0000 mg | Freq: Once | INTRAMUSCULAR | Status: DC

## 2015-03-05 MED ORDER — FENTANYL CITRATE (PF) 100 MCG/2ML IJ SOLN
INTRAMUSCULAR | Status: AC
  Filled 2015-03-05: qty 2

## 2015-03-05 MED ORDER — ONDANSETRON HCL 4 MG/2ML IJ SOLN: 4.0000 mg | Freq: Four times a day (QID) | INTRAMUSCULAR | Status: DC | PRN

## 2015-03-05 SURGICAL SUPPLY — 9 items
BAG DECANTER STRL (MISCELLANEOUS) ×3 IMPLANT
DRAPE INCISE IOBAN 66X45 STRL (DRAPES) ×3 IMPLANT
KIT PORT POWER 8FR ISP CVUE (Catheter) ×3 IMPLANT
PACK ANGIOGRAPHY (CUSTOM PROCEDURE TRAY) ×3 IMPLANT
PREP CHG 10.5 TEAL (MISCELLANEOUS) ×3 IMPLANT
SUT MNCRL AB 4-0 PS2 18 (SUTURE) ×3 IMPLANT
SUT PROLENE 0 CT 1 30 (SUTURE) ×3 IMPLANT
SUTURE VIC 3-0 (SUTURE) ×3 IMPLANT
TOWEL OR 17X26 4PK STRL BLUE (TOWEL DISPOSABLE) ×3 IMPLANT

## 2015-03-05 NOTE — Discharge Instructions (Signed)
Implanted Port Insertion, Care After Refer to this sheet in the next few weeks. These instructions provide you with information on caring for yourself after your procedure. Your health care provider may also give you more specific instructions. Your treatment has been planned according to current medical practices, but problems sometimes occur. Call your health care provider if you have any problems or questions after your procedure. WHAT TO EXPECT AFTER THE PROCEDURE After your procedure, it is typical to have the following:   Discomfort at the port insertion site. Ice packs to the area will help.  Bruising on the skin over the port. This will subside in 3-4 days. HOME CARE INSTRUCTIONS  After your port is placed, you will get a manufacturer's information card. The card has information about your port. Keep this card with you at all times.   Know what kind of port you have. There are many types of ports available.   Wear a medical alert bracelet in case of an emergency. This can help alert health care workers that you have a port.   The port can stay in for as long as your health care provider believes it is necessary.   A home health care nurse may give medicines and take care of the port.   You or a family member can get special training and directions for giving medicine and taking care of the port at home.  SEEK MEDICAL CARE IF:   Your port does not flush or you are unable to get a blood return.   You have a fever or chills. SEEK IMMEDIATE MEDICAL CARE IF:  You have new fluid or pus coming from your incision.   You notice a bad smell coming from your incision site.   You have swelling, pain, or more redness at the incision or port site.   You have chest pain or shortness of breath.   This information is not intended to replace advice given to you by your health care provider. Make sure you discuss any questions you have with your health care provider.   Document  Released: 10/11/2012 Document Revised: 12/26/2012 Document Reviewed: 10/11/2012 Elsevier Interactive Patient Education 2016 Portal Insertion An implanted port is a central line that has a round shape and is placed under the skin. It is used as a long-term IV access for:   Medicines, such as chemotherapy.   Fluids.   Liquid nutrition, such as total parenteral nutrition (TPN).   Blood samples.  LET [facility] CARE PROVIDER KNOW ABOUT:  Allergies to food or medicine.   Medicines taken, including vitamins, herbs, eye drops, creams, and over-the-counter medicines.   Any allergies to heparin.  Use of steroids (by mouth or creams).   Previous problems with anesthetics or numbing medicines.   History of bleeding problems or blood clots.   Previous surgery.   Other health problems, including diabetes and kidney problems.   Possibility of pregnancy, if this applies. RISKS AND COMPLICATIONS Generally, this is a safe procedure. However, as with any procedure, problems can occur. Possible problems include:  Damage to the blood vessel, bruising, or bleeding at the puncture site.   Infection.  Blood clot in the vessel that the port is in.  Breakdown of the skin over your port.  Very rarely a person may develop a condition called a pneumothorax, a collection of air in the chest that may cause one of the lungs to collapse. The placement of these catheters with the appropriate imaging  guidance significantly decreases the risk of a pneumothorax.  BEFORE THE PROCEDURE   Your health care provider may want you to have blood tests. These tests can help tell how well your kidneys and liver are working. They can also show how well your blood clots.   If you take blood thinners (anticoagulant medicines), ask your health care provider when you should stop taking them.   Make arrangements for someone to drive you home. This is necessary if you have been  sedated for your procedure.  PROCEDURE  Port insertion usually takes about 30-45 minutes.   An IV needle will be inserted in your arm. Medicine for pain and medicine to help relax you (sedative) will flow directly into your body through this needle.   You will lie on an exam table, and you will be connected to monitors to keep track of your heart rate, blood pressure, and breathing throughout the procedure.  An oxygen monitoring device may be attached to your finger. Oxygen will be given.   Everything will be kept as germ free (sterile) as possible during the procedure. The skin near the point of the incision will be cleansed with antiseptic, and the area will be draped with sterile towels. The skin and deeper tissues over the port area will be made numb with a local anesthetic.  Two small cuts (incisions) will be made in the skin to insert the port. One will be made in the neck to obtain access to the vein where the catheter will lie.   Because the port reservoir will be placed under the skin, a small skin incision will be made in the upper chest, and a small pocket for the port will be made under the skin. The catheter that will be connected to the port tunnels to a large central vein in the chest. A small, raised area will remain on your body at the site of the reservoir when the procedure is complete.  The port placement will be done under imaging guidance to ensure the proper placement.  The reservoir has a silicone covering that can be punctured with a special needle.   The port will be flushed with normal saline, and blood will be drawn to make sure it is working properly.  There will be nothing remaining outside the skin when the procedure is finished.   Incisions will be held together by stitches, surgical glue, or a special tape. AFTER THE PROCEDURE  You will stay in a recovery area until the anesthesia has worn off. Your blood pressure and pulse will be checked.  A  final chest X-ray will be taken to check the placement of the port and to ensure that there is no injury to your lung.   This information is not intended to replace advice given to you by your health care provider. Make sure you discuss any questions you have with your health care provider.   Document Released: 10/11/2012 Document Revised: 01/11/2014 Document Reviewed: 10/11/2012 Elsevier Interactive Patient Education 2016 [place] An implanted port is a type of central line that is placed under the skin. Central lines are used to provide IV access when treatment or nutrition needs to be given through a person's veins. Implanted ports are used for long-term IV access. An implanted port may be placed because:   You need IV medicine that would be irritating to the small veins in your hands or arms.   You need long-term IV medicines, such as antibiotics.  You need IV nutrition for a long period.   You need frequent blood draws for lab tests.   You need dialysis.  Implanted ports are usually placed in the chest area, but they can also be placed in the upper arm, the abdomen, or the leg. An implanted port has two main parts:   Reservoir. The reservoir is round and will appear as a small, raised area under your skin. The reservoir is the part where a needle is inserted to give medicines or draw blood.   Catheter. The catheter is a thin, flexible tube that extends from the reservoir. The catheter is placed into a large vein. Medicine that is inserted into the reservoir goes into the catheter and then into the vein.  HOW WILL I CARE FOR MY INCISION SITE? Do not get the incision site wet. Bathe or shower as directed by your health care provider.  HOW IS MY PORT ACCESSED? Special steps must be taken to access the port:   Before the port is accessed, a numbing cream can be placed on the skin. This helps numb the skin over the port site.   Your health care  provider uses a sterile technique to access the port.  Your health care provider must put on a mask and sterile gloves.  The skin over your port is cleaned carefully with an antiseptic and allowed to dry.  The port is gently pinched between sterile gloves, and a needle is inserted into the port.  Only "non-coring" port needles should be used to access the port. Once the port is accessed, a blood return should be checked. This helps ensure that the port is in the vein and is not clogged.   If your port needs to remain accessed for a constant infusion, a clear (transparent) bandage will be placed over the needle site. The bandage and needle will need to be changed every week, or as directed by your health care provider.   Keep the bandage covering the needle clean and dry. Do not get it wet. Follow your health care provider's instructions on how to take a shower or bath while the port is accessed.   If your port does not need to stay accessed, no bandage is needed over the port.  WHAT IS FLUSHING? Flushing helps keep the port from getting clogged. Follow your health care provider's instructions on how and when to flush the port. Ports are usually flushed with saline solution or a medicine called heparin. The need for flushing will depend on how the port is used.   If the port is used for intermittent medicines or blood draws, the port will need to be flushed:   After medicines have been given.   After blood has been drawn.   As part of routine maintenance.   If a constant infusion is running, the port may not need to be flushed.  HOW LONG WILL MY PORT STAY IMPLANTED? The port can stay in for as long as your health care provider thinks it is needed. When it is time for the port to come out, surgery will be done to remove it. The procedure is similar to the one performed when the port was put in.  WHEN SHOULD I SEEK IMMEDIATE MEDICAL CARE? When you have an implanted port, you  should seek immediate medical care if:   You notice a bad smell coming from the incision site.   You have swelling, redness, or drainage at the incision site.   You  have more swelling or pain at the port site or the surrounding area.   You have a fever that is not controlled with medicine.   This information is not intended to replace advice given to you by your health care provider. Make sure you discuss any questions you have with your health care provider.   Document Released: 12/21/2004 Document Revised: 10/11/2012 Document Reviewed: 08/28/2012 Elsevier Interactive Patient Education [employer].

## 2015-03-05 NOTE — H&P (Signed)
[facility] History & Physical Update  The patient was interviewed and re-examined.  The patient's previous History and Physical has been reviewed and is unchanged.  There is no change in the plan of care. We plan to proceed with the scheduled procedure.  Deanna Blair, Deanna Lory, MD  03/05/2015, 10:49 AM

## 2015-03-05 NOTE — Op Note (Signed)
OPERATIVE NOTE   PROCEDURE: 1. Placement of a right IJ Infuse-a-Port  PRE-OPERATIVE DIAGNOSIS: Lung carcinoma  POST-OPERATIVE DIAGNOSIS: Same  SURGEON: Katha Cabal M.D.  ANESTHESIA: Conscious sedation combined with 1% lidocaine with epinephrine  ESTIMATED BLOOD LOSS: Minimal   FINDING(S): 1.  Patent vein  SPECIMEN(S): None  INDICATIONS:   Deanna Blair is a 60 y.o. female who presents with lung carcinoma. She will undergo chemotherapy and therefore requires appropriate IV access. The risks and benefits have been reviewed all questions were answered patient wishes to proceed with port placement.  DESCRIPTION: After obtaining full informed written consent, the patient was brought back to the special procedure suite and placed in the supine position. The patient's right neck and chest wall are prepped and draped in sterile fashion. Appropriate timeout was called.  Ultrasound is placed in a sterile sleeve, ultrasound is utilized to avoid vascular injury as well as secondary to lack of appropriate landmarks. The right internal jugular vein is identified. It is echolucent and homogeneous as well as easily compressible indicating patency. 1% lidocaine is infiltrated into the soft tissue at the base of the neck as well as on the chest wall.  Under direct ultrasound visualization Seldinger needle is inserted into the right internal jugular vein. J-wire is advanced under fluoroscopic guidance. A small counterincision was created at the wire insertion site. A transverse incision is created 2 fingerbreadths below the scapula and a pocket is fashioned using both blunt and sharp dissection. The pocket is tested for appropriate size with the hub of the Infuse-a-Port. The tunneling device is then used to pull the intravascular portion of the catheter from the pocket to the neck counterincision.  Dilator and peel-away sheath were then inserted over the wire and the wire is removed. Catheter  is then advanced into the venous system without difficulty. Peel-away sheath was then removed.  Catheter is then positioned under fluoroscopic guidance at the atrial caval junction. It is then transected connected to the hub and the hope is slipped into the subcutaneous pocket on the chest wall. The hub was then accessed percutaneously and aspirates easily and flushes well and is flushed with 30 cc of heparinized saline. The pocket incision is then closed in layers using interrupted 3-0 Vicryl for the subcutaneous tissues and 4-0 Monocryl subcuticular for skin closure. Dermabond is applied. The neck counterincision was closed with 4-0 Monocryl subcuticular and Dermabond as well.  The patient tolerated the procedure well and there were no immediate complications.  COMPLICATIONS: None  CONDITION: Unchanged  Katha Cabal M.D. [facility] Office: [phone]   03/05/2015, 11:32 AM

## 2015-03-06 ENCOUNTER — Inpatient Hospital Stay: Payer: BLUE CROSS/BLUE SHIELD | Attending: Oncology

## 2015-03-06 DIAGNOSIS — F418 Other specified anxiety disorders: Secondary | ICD-10-CM | POA: Insufficient documentation

## 2015-03-06 DIAGNOSIS — C3431 Malignant neoplasm of lower lobe, right bronchus or lung: Secondary | ICD-10-CM | POA: Insufficient documentation

## 2015-03-06 DIAGNOSIS — Z8701 Personal history of pneumonia (recurrent): Secondary | ICD-10-CM | POA: Insufficient documentation

## 2015-03-06 DIAGNOSIS — Z5111 Encounter for antineoplastic chemotherapy: Secondary | ICD-10-CM | POA: Insufficient documentation

## 2015-03-06 DIAGNOSIS — C779 Secondary and unspecified malignant neoplasm of lymph node, unspecified: Secondary | ICD-10-CM | POA: Insufficient documentation

## 2015-03-06 DIAGNOSIS — Z808 Family history of malignant neoplasm of other organs or systems: Secondary | ICD-10-CM | POA: Insufficient documentation

## 2015-03-06 DIAGNOSIS — Z87891 Personal history of nicotine dependence: Secondary | ICD-10-CM | POA: Insufficient documentation

## 2015-03-06 DIAGNOSIS — Z79899 Other long term (current) drug therapy: Secondary | ICD-10-CM | POA: Insufficient documentation

## 2015-03-07 ENCOUNTER — Telehealth: Payer: Self-pay | Admitting: *Deleted

## 2015-03-07 ENCOUNTER — Other Ambulatory Visit: Payer: Self-pay | Admitting: *Deleted

## 2015-03-07 MED ORDER — LIDOCAINE-PRILOCAINE 2.5-2.5 % EX CREA: 1.0000 "application " | TOPICAL_CREAM | CUTANEOUS | Status: DC | PRN

## 2015-03-07 NOTE — Telephone Encounter (Signed)
rx for emla has been escribed to pharmacy.

## 2015-03-07 NOTE — Telephone Encounter (Signed)
-----   Message from Leona Singleton, RN sent at 03/07/2015  2:34 PM EST ----- Regarding: needs emla cream Hey Thinh Cuccaro,  Can you call in EMLA cream for this patient?  Thank you, Basilia Jumbo

## 2015-03-12 ENCOUNTER — Telehealth: Payer: Self-pay | Admitting: *Deleted

## 2015-03-12 ENCOUNTER — Inpatient Hospital Stay (HOSPITAL_BASED_OUTPATIENT_CLINIC_OR_DEPARTMENT_OTHER): Payer: BLUE CROSS/BLUE SHIELD | Admitting: Oncology

## 2015-03-12 ENCOUNTER — Inpatient Hospital Stay: Payer: BLUE CROSS/BLUE SHIELD

## 2015-03-12 ENCOUNTER — Encounter: Payer: Self-pay | Admitting: Vascular Surgery

## 2015-03-12 VITALS — BP 146/90 | HR 66 | Temp 96.6°F | Resp 18 | Wt 118.2 lb

## 2015-03-12 DIAGNOSIS — C3481 Malignant neoplasm of overlapping sites of right bronchus and lung: Secondary | ICD-10-CM

## 2015-03-12 DIAGNOSIS — C3431 Malignant neoplasm of lower lobe, right bronchus or lung: Secondary | ICD-10-CM

## 2015-03-12 DIAGNOSIS — Z808 Family history of malignant neoplasm of other organs or systems: Secondary | ICD-10-CM

## 2015-03-12 DIAGNOSIS — Z79899 Other long term (current) drug therapy: Secondary | ICD-10-CM | POA: Diagnosis not present

## 2015-03-12 DIAGNOSIS — Z8701 Personal history of pneumonia (recurrent): Secondary | ICD-10-CM | POA: Diagnosis not present

## 2015-03-12 DIAGNOSIS — F418 Other specified anxiety disorders: Secondary | ICD-10-CM | POA: Diagnosis not present

## 2015-03-12 DIAGNOSIS — Z5111 Encounter for antineoplastic chemotherapy: Secondary | ICD-10-CM | POA: Diagnosis not present

## 2015-03-12 DIAGNOSIS — C779 Secondary and unspecified malignant neoplasm of lymph node, unspecified: Secondary | ICD-10-CM

## 2015-03-12 DIAGNOSIS — Z87891 Personal history of nicotine dependence: Secondary | ICD-10-CM

## 2015-03-12 LAB — COMPREHENSIVE METABOLIC PANEL
ALBUMIN: 4 g/dL (ref 3.5–5.0)
ALT: 15 U/L (ref 14–54)
ANION GAP: 5 (ref 5–15)
AST: 20 U/L (ref 15–41)
Alkaline Phosphatase: 72 U/L (ref 38–126)
BUN: 17 mg/dL (ref 6–20)
CHLORIDE: 106 mmol/L (ref 101–111)
CO2: 24 mmol/L (ref 22–32)
Calcium: 8.7 mg/dL — ABNORMAL LOW (ref 8.9–10.3)
Creatinine, Ser: 0.47 mg/dL (ref 0.44–1.00)
GFR calc Af Amer: >60 mL/min (ref >60)
GLUCOSE: 85 mg/dL (ref 65–99)
POTASSIUM: 3.5 mmol/L (ref 3.5–5.1)
Sodium: 135 mmol/L (ref 135–145)
Total Bilirubin: 0.5 mg/dL (ref 0.3–1.2)
Total Protein: 7.2 g/dL (ref 6.5–8.1)

## 2015-03-12 LAB — CBC WITH DIFFERENTIAL/PLATELET
BASOS ABS: 0 10*3/uL (ref 0–0.1)
BASOS PCT: 1 %
EOS PCT: 2 %
Eosinophils Absolute: 0.2 10*3/uL (ref 0–0.7)
HCT: 35.8 % (ref 35.0–47.0)
Hemoglobin: 12.5 g/dL (ref 12.0–16.0)
Lymphocytes Relative: 13 %
Lymphs Abs: 0.9 10*3/uL — ABNORMAL LOW (ref 1.0–3.6)
MCH: 33.7 pg (ref 26.0–34.0)
MCHC: 35 g/dL (ref 32.0–36.0)
MCV: 96.2 fL (ref 80.0–100.0)
MONO ABS: 0.6 10*3/uL (ref 0.2–0.9)
Monocytes Relative: 9 %
Neutro Abs: 5 10*3/uL (ref 1.4–6.5)
Neutrophils Relative %: 75 %
PLATELETS: 231 10*3/uL (ref 150–440)
RBC: 3.72 MIL/uL — ABNORMAL LOW (ref 3.80–5.20)
RDW: 16 % — AB (ref 11.5–14.5)
WBC: 6.7 10*3/uL (ref 3.6–11.0)

## 2015-03-12 MED ORDER — SODIUM CHLORIDE 0.9 % IV SOLN
434.0000 mg | Freq: Once | INTRAVENOUS | Status: AC
  Administered 2015-03-12: 430 mg via INTRAVENOUS
  Filled 2015-03-12: qty 43

## 2015-03-12 MED ORDER — HEPARIN SOD (PORK) LOCK FLUSH 100 UNIT/ML IV SOLN
500.0000 [IU] | Freq: Once | INTRAVENOUS | Status: AC | PRN
  Administered 2015-03-12: 500 [IU]
  Filled 2015-03-12: qty 5

## 2015-03-12 MED ORDER — ZOLPIDEM TARTRATE 5 MG PO TABS: 5.0000 mg | ORAL_TABLET | Freq: Every evening | ORAL | Status: DC | PRN

## 2015-03-12 MED ORDER — DEXAMETHASONE 4 MG PO TABS: ORAL_TABLET | ORAL | Status: DC

## 2015-03-12 MED ORDER — PALONOSETRON HCL INJECTION 0.25 MG/5ML
0.2500 mg | Freq: Once | INTRAVENOUS | Status: AC
  Administered 2015-03-12: 0.25 mg via INTRAVENOUS
  Filled 2015-03-12: qty 5

## 2015-03-12 MED ORDER — ONDANSETRON HCL 8 MG PO TABS: 8.0000 mg | ORAL_TABLET | Freq: Two times a day (BID) | ORAL | Status: DC | PRN

## 2015-03-12 MED ORDER — SODIUM CHLORIDE 0.9% FLUSH
10.0000 mL | INTRAVENOUS | Status: DC | PRN
  Administered 2015-03-12: 10 mL
  Filled 2015-03-12: qty 10

## 2015-03-12 MED ORDER — SODIUM CHLORIDE 0.9 % IV SOLN
Freq: Once | INTRAVENOUS | Status: AC
  Administered 2015-03-12: 12:00:00 via INTRAVENOUS
  Filled 2015-03-12: qty 5

## 2015-03-12 MED ORDER — SODIUM CHLORIDE 0.9 % IV SOLN
500.0000 mg/m2 | Freq: Once | INTRAVENOUS | Status: AC
  Administered 2015-03-12: 750 mg via INTRAVENOUS
  Filled 2015-03-12: qty 24

## 2015-03-12 MED ORDER — SODIUM CHLORIDE 0.9 % IV SOLN
Freq: Once | INTRAVENOUS | Status: AC
  Administered 2015-03-12: 11:00:00 via INTRAVENOUS
  Filled 2015-03-12: qty 1000

## 2015-03-12 NOTE — Telephone Encounter (Signed)
Will call in ambien '5mg'$  at bedtime as needed.

## 2015-03-12 NOTE — Telephone Encounter (Signed)
Patient would like something called in to help her rest at night.

## 2015-03-12 NOTE — Progress Notes (Signed)
Patient would like to discuss with MD about giving her something to help with sleep and pain.

## 2015-03-12 NOTE — Telephone Encounter (Signed)
Called patient to inform her that prescription for Lorrin Mais has been called to pharmacy.  Verbalized understanding.

## 2015-03-12 NOTE — Progress Notes (Signed)
[place] @ [facility] Telephone:(336) [phone]  Fax:([phone]   INITIAL CONSULT  Deanna Blair OB: [birth year]  MR#: [id number]  KZL#:[id number]  Patient Care Team: Mar Daring, PA-C as PCP - General (Family Medicine) Allyne Gee, MD as Referring Physician (Internal Medicine)  CHIEF COMPLAINT:  Chief Complaint  Patient presents with  . 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 2.  Start chemotherapy with carboplatinum and Alimta from March 8 ,2017  VISIT DIAGNOSIS:  Abnormal CT scan of chest  No history exists.      INTERVAL HISTORY:  60 year old gentleman lady who had a previous history of smoking for last 30 years patient quit smoking approximately a few years ago.  Member of 2015 patient presented to primary physician with cough was told that she has pneumonia was treated with antibiotic.  Had an abnormal CT scan with right lower lobe infiltrate.  In December 2015 patient started having sharp chest pain went to emergency room but a repeat CT scan revealed progressing right lower lobe pulmonary infiltrate.  Patient was seen by pulmonologists Dr. Humphrey Rolls  and after few rounds of antibiotic a repeat CT scan revealed progressing disease.  Patient was referred to me for further evaluation and treatment consideration extremly anxious and apprehensive lady. Has  a dry hacking cough.\No hemoptysis.  No chest pain. Patient underwent bronchoscopy and was found to have adenocarcinoma.  Carinal lymph node was also positive for adenocarcinoma The patient is here to discuss the results and further planning treatment  Patient is here to initiate chemotherapy and number of questions which were answered.  Consent has been signed.  REVIEW OF SYSTEMS:   GENERAL:  Feels good.  Active.  No fevers, sweats or weight loss. PERFORMANCE STATUS (ECOG):  0 HEENT:  No visual  changes, runny nose, sore throat, mouth sores or tenderness. Lungs: Increasing cough and shortness of breath Cardiac:  No chest pain, palpitations, orthopnea, or PND. GI:  No nausea, vomiting, diarrhea, constipation, melena or hematochezia. GU:  No urgency, frequency, dysuria, or hematuria. Musculoskeletal:  No back pain.  No joint pain.  No muscle tenderness. Extremities:  No pain or swelling. Skin:  No rashes or skin changes. Neuro:  No headache, numbness or weakness, balance or coordination issues. Endocrine:  No diabetes, thyroid issues, hot flashes or night sweats. Psych:  No mood changes, depression he had patient's's family is slightly apprehensive and anxious Pain:  No focal pain. Review of systems:  All other systems reviewed and found to be negative.  As per HPI. Otherwise, a complete review of systems is negatve.  PAST MEDICAL HISTORY: Past Medical History  Diagnosis Date  . Depression with anxiety     Well controlled with Wellbutrin and Buspar  . Pneumonia   . Anxiety   . Depression   . Cancer of right lung ([place]) 02/13/2015    PAST SURGICAL HISTORY: Past Surgical History  Procedure Laterality Date  . Ectopic pregnancy surgery  1988  . Endobronchial ultrasound N/A 02/10/2015    Procedure: ENDOBRONCHIAL ULTRASOUND;  Surgeon: Flora Lipps, MD;  Location: [facility] ORS;  Service: Cardiopulmonary;  Laterality: N/A;  . Electromagnetic navigation brochoscopy N/A 02/10/2015    Procedure: ELECTROMAGNETIC NAVIGATION BRONCHOSCOPY;  Surgeon: Flora Lipps, MD;  Location: [facility] ORS;  Service: Cardiopulmonary;  Laterality: N/A;  . Peripheral vascular catheterization N/A 03/05/2015    Procedure: Porta Cath Insertion;  Surgeon:  Katha Cabal, MD;  Location: [place] [facility];  Service: Cardiovascular;  Laterality: N/A;    FAMILY HISTORY Family History  Problem Relation Age of Onset  . Cancer Father     Prostate Cancer  . Hypertension Father   . Stroke Father   . Hypertension Brother       GYNECOLOGIC HISTORY:  No LMP recorded. Patient is postmenopausal.     ADVANCED DIRECTIVES:  Patient does not have any living will or healthcare power of attorney.  Information was given .  Available resources had been discussed.  We will follow-up on subsequent appointments regarding this issue  HEALTH MAINTENANCE: Social History  Substance Use Topics  . Smoking status: Former Smoker -- 0.25 packs/day for 30 years    Types: Cigarettes    Quit date: 02/06/2005  . Smokeless tobacco: Never Used  . Alcohol Use: 3.0 oz/week    5 Cans of beer per week     Comment: 5 beers weekly     Colonoscopy:  PAP:  Bone density:  Lipid panel:  No Known Allergies  Current Outpatient Prescriptions  Medication Sig Dispense Refill  . busPIRone (BUSPAR) 5 MG tablet TAKE ONE TABLET BY MOUTH TWICE DAILY 60 tablet 6  . folic acid (FOLVITE) 1 MG tablet Take 1 tablet (1 mg total) by mouth daily. 30 tablet 3  . lidocaine-prilocaine (EMLA) cream Apply 1 application topically as needed. 30 g 2  . ondansetron (ZOFRAN) 4 MG tablet     . SPIRIVA RESPIMAT 1.25 MCG/ACT AERS     . SYMBICORT 80-4.5 MCG/ACT inhaler      No current facility-administered medications for this visit.    OBJECTIVE: PHYSICAL EXAM  General  status: Performance status is good.  Patient has not lost significant weight. Since last evaluation there is no significant change in the general status HEENT: No evidence of stomatitis. Sclera and conjunctivae :: No jaundice.   pale looking. Lungs: Air  entry equal on both sides.  Coarse crepitation in the right lower lobe.  Cardiac: Heart sounds are normal.  No pericardial rub.  No murmur. Lymphatic system: Cervical, axillary, inguinal, lymph nodes not palpable GI: Abdomen is soft.liver and spleen not palpable.  No ascites.  Bowel sounds are normal.  No other palpable masses.  No tenderness . Lower extremity: No edema Neurological system: Higher functions, cranial nerves intact no  evidence of peripheral neuropathy. Skin: No rash.  No ecchymosis.. No petechial hemorrhages  Filed Vitals:   03/12/15 0953  BP: 146/90  Pulse: 66  Temp: 96.6 F (35.9 C)  Resp: 18     Body mass index is 21.61 kg/(m^2).    ECOG FS:0 - Asymptomatic  LAB RESULTS:  Appointment on 03/12/2015  Component Date Value Ref Range Status  . WBC 03/12/2015 6.7  3.6 - 11.0 K/uL Final  . RBC 03/12/2015 3.72* 3.80 - 5.20 MIL/uL Final  . Hemoglobin 03/12/2015 12.5  12.0 - 16.0 g/dL Final  . HCT 03/12/2015 35.8  35.0 - 47.0 % Final  . MCV 03/12/2015 96.2  80.0 - 100.0 fL Final  . MCH 03/12/2015 33.7  26.0 - 34.0 pg Final  . MCHC 03/12/2015 35.0  32.0 - 36.0 g/dL Final  . RDW 03/12/2015 16.0* 11.5 - 14.5 % Final  . Platelets 03/12/2015 231  150 - 440 K/uL Final  . Neutrophils Relative % 03/12/2015 75   Final  . Neutro Abs 03/12/2015 5.0  1.4 - 6.5 K/uL Final  . Lymphocytes Relative 03/12/2015 13  Final  . Lymphs Abs 03/12/2015 0.9* 1.0 - 3.6 K/uL Final  . Monocytes Relative 03/12/2015 9   Final  . Monocytes Absolute 03/12/2015 0.6  0.2 - 0.9 K/uL Final  . Eosinophils Relative 03/12/2015 2   Final  . Eosinophils Absolute 03/12/2015 0.2  0 - 0.7 K/uL Final  . Basophils Relative 03/12/2015 1   Final  . Basophils Absolute 03/12/2015 0.0  0 - 0.1 K/uL Final  . Sodium 03/12/2015 135  135 - 145 mmol/L Final  . Potassium 03/12/2015 3.5  3.5 - 5.1 mmol/L Final  . Chloride 03/12/2015 106  101 - 111 mmol/L Final  . CO2 03/12/2015 24  22 - 32 mmol/L Final  . Glucose, Bld 03/12/2015 85  65 - 99 mg/dL Final  . BUN 03/12/2015 17  6 - 20 mg/dL Final  . Creatinine, Ser 03/12/2015 0.47  0.44 - 1.00 mg/dL Final  . Calcium 03/12/2015 8.7* 8.9 - 10.3 mg/dL Final  . Total Protein 03/12/2015 7.2  6.5 - 8.1 g/dL Final  . Albumin 03/12/2015 4.0  3.5 - 5.0 g/dL Final  . AST 03/12/2015 20  15 - 41 U/L Final  . ALT 03/12/2015 15  14 - 54 U/L Final  . Alkaline Phosphatase 03/12/2015 72  38 - 126 U/L Final  . Total  Bilirubin 03/12/2015 0.5  0.3 - 1.2 mg/dL Final  . GFR calc non Af Amer 03/12/2015 >60  >60 mL/min Final  . GFR calc Af Amer 03/12/2015 >60  >60 mL/min Final   Comment: (NOTE) The eGFR has been calculated using the CKD EPI equation. This calculation has not been validated in all clinical situations. eGFR's persistently <60 mL/min signify possible Chronic Kidney Disease.   . Anion gap 03/12/2015 5  5 - 15 Final      ASSESSMENT:  Pulmonary infiltrate with middle lobe and lower lobe of the lung positive for adenocarcinoma  Subcarinal lymph node biopsy is also positive. PET scan shows low activity in the right middle and lower lobe  Scars that findings with the patient.  Brain scan is essentially normal without any evidence of metastases  To proceed with carboplatinum and Alimta  Patient was explained all the side effects of chemotherapy.  And informed consent has been obtained  Intent of chemotherapy is palliation and relief in symptoms and extending survival Patient expressed understanding and was in agreement with this plan. She also understands that She can call clinic at any time with any questions, concerns, or complaints.   All lab data has been reviewed No matching staging information was found for the patient.  Forest Gleason, MD   03/12/2015 10:14 AM

## 2015-03-12 NOTE — Addendum Note (Signed)
Addended by: Telford Nab on: 03/12/2015 04:02 PM   Modules accepted: Orders

## 2015-03-12 NOTE — Telephone Encounter (Signed)
Patient states she was here today and give prescriptions for Decadron and Zofran.  She was under the impression that she was to have a third prescription called that would help her rest at night.  Called patient back and got answering machine.  Asked patient to call back if she does need something to help her sleep.

## 2015-03-19 ENCOUNTER — Inpatient Hospital Stay: Payer: BLUE CROSS/BLUE SHIELD

## 2015-03-19 DIAGNOSIS — C3481 Malignant neoplasm of overlapping sites of right bronchus and lung: Secondary | ICD-10-CM

## 2015-03-19 DIAGNOSIS — C3431 Malignant neoplasm of lower lobe, right bronchus or lung: Secondary | ICD-10-CM | POA: Diagnosis not present

## 2015-03-19 LAB — CBC WITH DIFFERENTIAL/PLATELET
BASOS ABS: 0 10*3/uL (ref 0–0.1)
Basophils Relative: 1 %
Eosinophils Absolute: 0.2 10*3/uL (ref 0–0.7)
Eosinophils Relative: 5 %
HEMATOCRIT: 37.5 % (ref 35.0–47.0)
Hemoglobin: 13.2 g/dL (ref 12.0–16.0)
LYMPHS PCT: 20 %
Lymphs Abs: 0.9 10*3/uL — ABNORMAL LOW (ref 1.0–3.6)
MCH: 33.5 pg (ref 26.0–34.0)
MCHC: 35.1 g/dL (ref 32.0–36.0)
MCV: 95.4 fL (ref 80.0–100.0)
Monocytes Absolute: 0.3 10*3/uL (ref 0.2–0.9)
Monocytes Relative: 7 %
NEUTROS ABS: 2.9 10*3/uL (ref 1.4–6.5)
Neutrophils Relative %: 67 %
PLATELETS: 239 10*3/uL (ref 150–440)
RBC: 3.93 MIL/uL (ref 3.80–5.20)
RDW: 15.3 % — ABNORMAL HIGH (ref 11.5–14.5)
WBC: 4.3 10*3/uL (ref 3.6–11.0)

## 2015-03-26 ENCOUNTER — Inpatient Hospital Stay: Payer: BLUE CROSS/BLUE SHIELD

## 2015-03-26 DIAGNOSIS — C3431 Malignant neoplasm of lower lobe, right bronchus or lung: Secondary | ICD-10-CM | POA: Diagnosis not present

## 2015-03-26 DIAGNOSIS — C3481 Malignant neoplasm of overlapping sites of right bronchus and lung: Secondary | ICD-10-CM

## 2015-03-26 LAB — COMPREHENSIVE METABOLIC PANEL
ALK PHOS: 64 U/L (ref 38–126)
ALT: 20 U/L (ref 14–54)
ANION GAP: 8 (ref 5–15)
AST: 29 U/L (ref 15–41)
Albumin: 3.9 g/dL (ref 3.5–5.0)
BUN: 16 mg/dL (ref 6–20)
CALCIUM: 8.8 mg/dL — AB (ref 8.9–10.3)
CO2: 22 mmol/L (ref 22–32)
Chloride: 106 mmol/L (ref 101–111)
Creatinine, Ser: 0.64 mg/dL (ref 0.44–1.00)
GFR calc non Af Amer: >60 mL/min (ref >60)
Glucose, Bld: 124 mg/dL — ABNORMAL HIGH (ref 65–99)
Potassium: 3.7 mmol/L (ref 3.5–5.1)
SODIUM: 136 mmol/L (ref 135–145)
Total Bilirubin: 0.7 mg/dL (ref 0.3–1.2)
Total Protein: 7 g/dL (ref 6.5–8.1)

## 2015-03-26 LAB — CBC WITH DIFFERENTIAL/PLATELET
Basophils Absolute: 0 10*3/uL (ref 0–0.1)
Basophils Relative: 0 %
EOS ABS: 0.1 10*3/uL (ref 0–0.7)
EOS PCT: 2 %
HCT: 34.4 % — ABNORMAL LOW (ref 35.0–47.0)
HEMOGLOBIN: 12.1 g/dL (ref 12.0–16.0)
LYMPHS ABS: 0.8 10*3/uL — AB (ref 1.0–3.6)
Lymphocytes Relative: 14 %
MCH: 33.7 pg (ref 26.0–34.0)
MCHC: 35.3 g/dL (ref 32.0–36.0)
MCV: 95.6 fL (ref 80.0–100.0)
MONO ABS: 0.5 10*3/uL (ref 0.2–0.9)
MONOS PCT: 9 %
Neutro Abs: 4 10*3/uL (ref 1.4–6.5)
Neutrophils Relative %: 75 %
PLATELETS: 168 10*3/uL (ref 150–440)
RBC: 3.6 MIL/uL — ABNORMAL LOW (ref 3.80–5.20)
RDW: 15.2 % — ABNORMAL HIGH (ref 11.5–14.5)
WBC: 5.4 10*3/uL (ref 3.6–11.0)

## 2015-03-31 ENCOUNTER — Encounter: Payer: Self-pay | Admitting: Oncology

## 2015-03-31 ENCOUNTER — Inpatient Hospital Stay: Payer: BLUE CROSS/BLUE SHIELD

## 2015-03-31 ENCOUNTER — Emergency Department: Payer: BLUE CROSS/BLUE SHIELD

## 2015-03-31 ENCOUNTER — Other Ambulatory Visit: Payer: Self-pay | Admitting: *Deleted

## 2015-03-31 ENCOUNTER — Inpatient Hospital Stay (HOSPITAL_BASED_OUTPATIENT_CLINIC_OR_DEPARTMENT_OTHER): Payer: BLUE CROSS/BLUE SHIELD | Admitting: Oncology

## 2015-03-31 VITALS — BP 138/82 | HR 80 | Temp 97.1°F | Resp 18 | Wt 119.4 lb

## 2015-03-31 DIAGNOSIS — J189 Pneumonia, unspecified organism: Secondary | ICD-10-CM | POA: Insufficient documentation

## 2015-03-31 DIAGNOSIS — C3490 Malignant neoplasm of unspecified part of unspecified bronchus or lung: Secondary | ICD-10-CM

## 2015-03-31 DIAGNOSIS — Z87891 Personal history of nicotine dependence: Secondary | ICD-10-CM

## 2015-03-31 DIAGNOSIS — E785 Hyperlipidemia, unspecified: Secondary | ICD-10-CM | POA: Insufficient documentation

## 2015-03-31 DIAGNOSIS — Z8701 Personal history of pneumonia (recurrent): Secondary | ICD-10-CM

## 2015-03-31 DIAGNOSIS — Z79899 Other long term (current) drug therapy: Secondary | ICD-10-CM | POA: Diagnosis not present

## 2015-03-31 DIAGNOSIS — I509 Heart failure, unspecified: Secondary | ICD-10-CM | POA: Insufficient documentation

## 2015-03-31 DIAGNOSIS — C779 Secondary and unspecified malignant neoplasm of lymph node, unspecified: Secondary | ICD-10-CM | POA: Diagnosis not present

## 2015-03-31 DIAGNOSIS — C3431 Malignant neoplasm of lower lobe, right bronchus or lung: Secondary | ICD-10-CM

## 2015-03-31 DIAGNOSIS — Z808 Family history of malignant neoplasm of other organs or systems: Secondary | ICD-10-CM

## 2015-03-31 DIAGNOSIS — Z85118 Personal history of other malignant neoplasm of bronchus and lung: Secondary | ICD-10-CM | POA: Insufficient documentation

## 2015-03-31 DIAGNOSIS — C3481 Malignant neoplasm of overlapping sites of right bronchus and lung: Secondary | ICD-10-CM

## 2015-03-31 DIAGNOSIS — F418 Other specified anxiety disorders: Secondary | ICD-10-CM | POA: Diagnosis not present

## 2015-03-31 DIAGNOSIS — F329 Major depressive disorder, single episode, unspecified: Secondary | ICD-10-CM | POA: Diagnosis not present

## 2015-03-31 DIAGNOSIS — R509 Fever, unspecified: Secondary | ICD-10-CM | POA: Diagnosis present

## 2015-03-31 LAB — COMPREHENSIVE METABOLIC PANEL
ALBUMIN: 4.2 g/dL (ref 3.5–5.0)
ALT: 17 U/L (ref 14–54)
ANION GAP: 5 (ref 5–15)
AST: 27 U/L (ref 15–41)
Alkaline Phosphatase: 75 U/L (ref 38–126)
BILIRUBIN TOTAL: 0.5 mg/dL (ref 0.3–1.2)
BUN: 18 mg/dL (ref 6–20)
CALCIUM: 8.7 mg/dL — AB (ref 8.9–10.3)
CO2: 25 mmol/L (ref 22–32)
Chloride: 102 mmol/L (ref 101–111)
Creatinine, Ser: 0.65 mg/dL (ref 0.44–1.00)
Glucose, Bld: 144 mg/dL — ABNORMAL HIGH (ref 65–99)
POTASSIUM: 3.8 mmol/L (ref 3.5–5.1)
Sodium: 132 mmol/L — ABNORMAL LOW (ref 135–145)
TOTAL PROTEIN: 7.2 g/dL (ref 6.5–8.1)

## 2015-03-31 LAB — CBC
HEMATOCRIT: 33.3 % — AB (ref 35.0–47.0)
HEMOGLOBIN: 11.5 g/dL — AB (ref 12.0–16.0)
MCH: 33.1 pg (ref 26.0–34.0)
MCHC: 34.6 g/dL (ref 32.0–36.0)
MCV: 95.6 fL (ref 80.0–100.0)
Platelets: 359 10*3/uL (ref 150–440)
RBC: 3.49 MIL/uL — ABNORMAL LOW (ref 3.80–5.20)
RDW: 14.9 % — AB (ref 11.5–14.5)
WBC: 4 10*3/uL (ref 3.6–11.0)

## 2015-03-31 LAB — CBC WITH DIFFERENTIAL/PLATELET
BASOS PCT: 0 %
Basophils Absolute: 0 10*3/uL (ref 0–0.1)
Eosinophils Absolute: 0.1 10*3/uL (ref 0–0.7)
Eosinophils Relative: 3 %
HEMATOCRIT: 33.1 % — AB (ref 35.0–47.0)
Hemoglobin: 11.6 g/dL — ABNORMAL LOW (ref 12.0–16.0)
LYMPHS ABS: 0.6 10*3/uL — AB (ref 1.0–3.6)
Lymphocytes Relative: 16 %
MCH: 33.9 pg (ref 26.0–34.0)
MCHC: 35.2 g/dL (ref 32.0–36.0)
MCV: 96.2 fL (ref 80.0–100.0)
MONO ABS: 0.4 10*3/uL (ref 0.2–0.9)
MONOS PCT: 11 %
NEUTROS ABS: 2.7 10*3/uL (ref 1.4–6.5)
Neutrophils Relative %: 70 %
Platelets: 345 10*3/uL (ref 150–440)
RBC: 3.44 MIL/uL — ABNORMAL LOW (ref 3.80–5.20)
RDW: 14.6 % — AB (ref 11.5–14.5)
WBC: 3.8 10*3/uL (ref 3.6–11.0)

## 2015-03-31 MED ORDER — ACETAMINOPHEN 325 MG PO TABS
ORAL_TABLET | ORAL | Status: AC
  Filled 2015-03-31: qty 2

## 2015-03-31 MED ORDER — ACETAMINOPHEN 325 MG PO TABS
650.0000 mg | ORAL_TABLET | Freq: Once | ORAL | Status: AC
  Administered 2015-03-31: 650 mg via ORAL

## 2015-03-31 NOTE — ED Notes (Addendum)
Pt in with co headache, body aches, fever, chills, and cough.  Denies any vomiting or diarrhea but does have nausea.   Pt currently going through chemo treatments for lung CA.

## 2015-03-31 NOTE — Progress Notes (Signed)
[place] @ [facility] Telephone:(336) [phone]  Fax:(336) [phone]   INITIAL CONSULT  Deanna Blair OB: Jul 10, 1955  MR#: [id number]  IOX#:[id number]  Deanna Blair Care Team: Mar Daring, PA-C as PCP - General (Family Medicine) Allyne Gee, MD as Referring Physician (Internal Medicine)  CHIEF COMPLAINT:  Chief Complaint  Deanna Blair presents with  . 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 2.  Start chemotherapy with carboplatinum and Alimta from March 8 ,2017  VISIT DIAGNOSIS:  Abnormal CT scan of chest  No history exists.      INTERVAL HISTORY:  60 year old gentleman lady who had a previous history of smoking for last 30 years Deanna Blair quit smoking approximately a few years ago.  Member of 2015 Deanna Blair presented to primary physician with cough was told that Deanna Blair has pneumonia was treated with antibiotic.  Had an abnormal CT scan with right lower lobe infiltrate.  In December 2015 Deanna Blair started having sharp chest pain went to emergency room but a repeat CT scan revealed progressing right lower lobe pulmonary infiltrate.  Deanna Blair was seen by pulmonologists Dr. Humphrey Rolls  and after few rounds of antibiotic a repeat CT scan revealed progressing disease.  Deanna Blair was referred to me for further evaluation and treatment consideration extremly anxious and apprehensive lady. Deanna Blair is here for the second cycle of chemotherapy.  Noticing a numbness.  No cough or shortness of breath.  Appetite has been improving. No chills fever no diarrhea  REVIEW OF SYSTEMS:   GENERAL:  Feels good.  Active.  No fevers, sweats or weight loss. PERFORMANCE STATUS (ECOG):  0 HEENT:  No visual changes, runny nose, sore throat, mouth sores or tenderness. Lungs: Increasing cough and shortness of breath Cardiac:  No chest pain, palpitations, orthopnea, or PND. GI:  No nausea, vomiting, diarrhea,  constipation, melena or hematochezia. GU:  No urgency, frequency, dysuria, or hematuria. Musculoskeletal:  No back pain.  No joint pain.  No muscle tenderness. Extremities:  No pain or swelling. Skin:  No rashes or skin changes. Neuro:  No headache, numbness or weakness, balance or coordination issues. Endocrine:  No diabetes, thyroid issues, hot flashes or night sweats. Psych:  No mood changes, depression he had Deanna Blair's's family is slightly apprehensive and anxious Pain:  No focal pain. Review of systems:  All other systems reviewed and found to be negative.  As per HPI. Otherwise, a complete review of systems is negatve.  PAST MEDICAL HISTORY: Past Medical History  Diagnosis Date  . Depression with anxiety     Well controlled with Wellbutrin and Buspar  . Pneumonia   . Anxiety   . Depression   . Cancer of right lung ([place]) 02/13/2015    PAST SURGICAL HISTORY: Past Surgical History  Procedure Laterality Date  . Ectopic pregnancy surgery  1988  . Endobronchial ultrasound N/A 02/10/2015    Procedure: ENDOBRONCHIAL ULTRASOUND;  Surgeon: Flora Lipps, MD;  Location: [facility] ORS;  Service: Cardiopulmonary;  Laterality: N/A;  . Electromagnetic navigation brochoscopy N/A 02/10/2015    Procedure: ELECTROMAGNETIC NAVIGATION BRONCHOSCOPY;  Surgeon: Flora Lipps, MD;  Location: [facility];  Service: Cardiopulmonary;  Laterality: N/A;  . Peripheral vascular catheterization N/A 03/05/2015    Procedure: Glori Luis Cath Insertion;  Surgeon: Katha Cabal, MD;  Location: [place] [facility];  Service: Cardiovascular;  Laterality: N/A;    FAMILY HISTORY Family History  Problem Relation Age of Onset  .  Cancer Father     Prostate Cancer  . Hypertension Father   . Stroke Father   . Hypertension Brother     GYNECOLOGIC HISTORY:  No LMP recorded. Deanna Blair is postmenopausal.     ADVANCED DIRECTIVES:  Deanna Blair does not have any living will or healthcare power of attorney.  Information was given .   Available resources had been discussed.  We will follow-up on subsequent appointments regarding this issue  HEALTH MAINTENANCE: Social History  Substance Use Topics  . Smoking status: Former Smoker -- 0.25 packs/day for 30 years    Types: Cigarettes    Quit date: 02/06/2005  . Smokeless tobacco: Never Used  . Alcohol Use: 3.0 oz/week    5 Cans of beer per week     Comment: 5 beers weekly   :  No Known Allergies  Current Outpatient Prescriptions  Medication Sig Dispense Refill  . busPIRone (BUSPAR) 5 MG tablet TAKE ONE TABLET BY MOUTH TWICE DAILY 60 tablet 6  . dexamethasone (DECADRON) 4 MG tablet Take 1 tablet by mouth at 8am and 5pm on day 2 and day 3 after chemotherapy. 30 tablet 0  . folic acid (FOLVITE) 1 MG tablet Take 1 tablet (1 mg total) by mouth daily. 30 tablet 3  . lidocaine-prilocaine (EMLA) cream Apply 1 application topically as needed. 30 g 2  . ondansetron (ZOFRAN) 8 MG tablet Take 1 tablet (8 mg total) by mouth 2 (two) times daily as needed (Nausea or vomiting). Start if needed on the third day after chemotherapy. 30 tablet 1  . SPIRIVA RESPIMAT 1.25 MCG/ACT AERS     . SYMBICORT 80-4.5 MCG/ACT inhaler     . zolpidem (AMBIEN) 5 MG tablet Take 1 tablet (5 mg total) by mouth at bedtime as needed for sleep. 30 tablet 0   No current facility-administered medications for this visit.    OBJECTIVE: PHYSICAL EXAM  General  status: Performance status is good.  Deanna Blair has not lost significant weight. Since last evaluation there is no significant change in the general status HEENT: No evidence of stomatitis. Sclera and conjunctivae :: No jaundice.   pale looking. Lungs: Air  entry equal on both sides.  Coarse crepitation in the right lower lobe.  Cardiac: Heart sounds are normal.  No pericardial rub.  No murmur. Lymphatic system: Cervical, axillary, inguinal, lymph nodes not palpable GI: Abdomen is soft.liver and spleen not palpable.  No ascites.  Bowel sounds are normal.   No other palpable masses.  No tenderness . Lower extremity: No edema Neurological system: Higher functions, cranial nerves intact no evidence of peripheral neuropathy. Skin: No rash.  No ecchymosis.. No petechial hemorrhages  Filed Vitals:   03/31/15 1159  BP: 138/82  Pulse: 80  Temp: 97.1 F (36.2 C)  Resp: 18     Body mass index is 21.83 kg/(m^2).    ECOG FS:0 - Asymptomatic  LAB RESULTS:  Appointment on 03/31/2015  Component Date Value Ref Range Status  . WBC 03/31/2015 3.8  3.6 - 11.0 K/uL Final  . RBC 03/31/2015 3.44* 3.80 - 5.20 MIL/uL Final  . Hemoglobin 03/31/2015 11.6* 12.0 - 16.0 g/dL Final  . HCT 03/31/2015 33.1* 35.0 - 47.0 % Final  . MCV 03/31/2015 96.2  80.0 - 100.0 fL Final  . MCH 03/31/2015 33.9  26.0 - 34.0 pg Final  . MCHC 03/31/2015 35.2  32.0 - 36.0 g/dL Final  . RDW 03/31/2015 14.6* 11.5 - 14.5 % Final  . Platelets 03/31/2015 345  150 -  440 K/uL Final  . Neutrophils Relative % 03/31/2015 70   Final  . Neutro Abs 03/31/2015 2.7  1.4 - 6.5 K/uL Final  . Lymphocytes Relative 03/31/2015 16   Final  . Lymphs Abs 03/31/2015 0.6* 1.0 - 3.6 K/uL Final  . Monocytes Relative 03/31/2015 11   Final  . Monocytes Absolute 03/31/2015 0.4  0.2 - 0.9 K/uL Final  . Eosinophils Relative 03/31/2015 3   Final  . Eosinophils Absolute 03/31/2015 0.1  0 - 0.7 K/uL Final  . Basophils Relative 03/31/2015 0   Final  . Basophils Absolute 03/31/2015 0.0  0 - 0.1 K/uL Final  . Sodium 03/31/2015 132* 135 - 145 mmol/L Final  . Potassium 03/31/2015 3.8  3.5 - 5.1 mmol/L Final  . Chloride 03/31/2015 102  101 - 111 mmol/L Final  . CO2 03/31/2015 25  22 - 32 mmol/L Final  . Glucose, Bld 03/31/2015 144* 65 - 99 mg/dL Final  . BUN 03/31/2015 18  6 - 20 mg/dL Final  . Creatinine, Ser 03/31/2015 0.65  0.44 - 1.00 mg/dL Final  . Calcium 03/31/2015 8.7* 8.9 - 10.3 mg/dL Final  . Total Protein 03/31/2015 7.2  6.5 - 8.1 g/dL Final  . Albumin 03/31/2015 4.2  3.5 - 5.0 g/dL Final  . AST  03/31/2015 27  15 - 41 U/L Final  . ALT 03/31/2015 17  14 - 54 U/L Final  . Alkaline Phosphatase 03/31/2015 75  38 - 126 U/L Final  . Total Bilirubin 03/31/2015 0.5  0.3 - 1.2 mg/dL Final  . GFR calc non Af Amer 03/31/2015 >60  >60 mL/min Final  . GFR calc Af Amer 03/31/2015 >60  >60 mL/min Final   Comment: (NOTE) The eGFR has been calculated using the CKD EPI equation. This calculation has not been validated in all clinical situations. eGFR's persistently <60 mL/min signify possible Chronic Kidney Disease.   . Anion gap 03/31/2015 5  5 - 15 Final      ASSESSMENT:  Pulmonary infiltrate with middle lobe and lower lobe of the lung positive for adenocarcinoma  Subcarinal lymph node biopsy is also positive. PET scan shows low activity in the right middle and lower lobe  Preprocedure second cycle of chemotherapy.  Deanna Blair is taking folic acid.  Also will give vitamin B12 injection.  So far there is no significant side effect except for fatigue which is grade 1. After total 3 cycles of chemotherapy Deanna Blair will undergo CT scan for reevaluation Deanna Blair was explained all the side effects of chemotherapy.  And informed consent has been obtained  Intent of chemotherapy is palliation and relief in symptoms and extending survival Deanna Blair expressed understanding and was in agreement with this plan. Deanna Blair also understands that Deanna Blair can call clinic at any time with any questions, concerns, or complaints.   All lab data has been reviewed No matching staging information was found for the Deanna Blair.  Forest Gleason, MD   03/31/2015 12:39 PM

## 2015-04-01 ENCOUNTER — Telehealth: Payer: Self-pay | Admitting: *Deleted

## 2015-04-01 ENCOUNTER — Emergency Department
Admission: EM | Admit: 2015-04-01 | Discharge: 2015-04-01 | Disposition: A | Discharge status: Home / Self Care | Payer: BLUE CROSS/BLUE SHIELD | Attending: Emergency Medicine | Admitting: Emergency Medicine

## 2015-04-01 DIAGNOSIS — R509 Fever, unspecified: Secondary | ICD-10-CM

## 2015-04-01 DIAGNOSIS — J189 Pneumonia, unspecified organism: Secondary | ICD-10-CM

## 2015-04-01 DIAGNOSIS — R52 Pain, unspecified: Secondary | ICD-10-CM

## 2015-04-01 LAB — BASIC METABOLIC PANEL
Anion gap: 9 (ref 5–15)
BUN: 16 mg/dL (ref 6–20)
CO2: 21 mmol/L — ABNORMAL LOW (ref 22–32)
CREATININE: 0.76 mg/dL (ref 0.44–1.00)
Calcium: 8.8 mg/dL — ABNORMAL LOW (ref 8.9–10.3)
Chloride: 103 mmol/L (ref 101–111)
GFR calc Af Amer: >60 mL/min (ref >60)
Glucose, Bld: 128 mg/dL — ABNORMAL HIGH (ref 65–99)
Potassium: 3.8 mmol/L (ref 3.5–5.1)
SODIUM: 133 mmol/L — AB (ref 135–145)

## 2015-04-01 LAB — RAPID INFLUENZA A&B ANTIGENS: Influenza A (ARMC): NEGATIVE

## 2015-04-01 LAB — RAPID INFLUENZA A&B ANTIGENS (ARMC ONLY): INFLUENZA B (ARMC): NEGATIVE

## 2015-04-01 MED ORDER — LEVOFLOXACIN IN D5W 750 MG/150ML IV SOLN
750.0000 mg | Freq: Once | INTRAVENOUS | Status: DC
Start: 2015-04-01 — End: 2015-04-01
  Filled 2015-04-01: qty 150

## 2015-04-01 MED ORDER — LEVOFLOXACIN 750 MG PO TABS: 750.0000 mg | ORAL_TABLET | Freq: Every day | ORAL | Status: AC

## 2015-04-01 NOTE — ED Provider Notes (Signed)
[facility] Emergency Department Provider Note  ____________________________________________  Time seen: Approximately 2:26 AM  I have reviewed the triage vital signs and the nursing notes.   HISTORY  Chief Complaint Fever    HPI Deanna Blair is a 60 y.o. female who comes into the hospital today reporting that he started to feel bad. She reports that she went to bed and had some cough, chills, body aches and nausea. She took her temperature and it was 100.2. She is currently undergoing treatment for lung cancer and had her first chemotherapy last week. The patient's spouse called the [place] phone number was told to come into the hospital to get checked out. She reports that there have been sick people at work and she's had a nonproductive cough. She has not received her flu shot and she did not take anything for her symptoms at home. She reports that she just felt bad so she decided to come in and get checked out at the recommendation of the on-call person. The patient reports that her doctor is currently out of the country.   Past Medical History  Diagnosis Date  . Depression with anxiety     Well controlled with Wellbutrin and Buspar  . Pneumonia   . Anxiety   . Depression   . Cancer of right lung ([place]) 02/13/2015    Patient Active Problem List   Diagnosis Date Noted  . Cancer of right lung ([place]) 02/13/2015  . Adenopathy   . Lung mass   . 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 Cardiac enlargement 01/03/2015  . Congestive heart failure ([place]) 01/02/2015  . History of tobacco abuse 12/20/2014  . History of cervical cancer 12/11/2014  . Tobacco abuse counseling 12/26/2012    Past Surgical History  Procedure Laterality Date  . Ectopic pregnancy surgery  1988  . Endobronchial ultrasound N/A 02/10/2015    Procedure: ENDOBRONCHIAL ULTRASOUND;  Surgeon: Flora Lipps, MD;  Location: [facility] ORS;  Service: Cardiopulmonary;   Laterality: N/A;  . Electromagnetic navigation brochoscopy N/A 02/10/2015    Procedure: ELECTROMAGNETIC NAVIGATION BRONCHOSCOPY;  Surgeon: Flora Lipps, MD;  Location: [facility] ORS;  Service: Cardiopulmonary;  Laterality: N/A;  . Peripheral vascular catheterization N/A 03/05/2015    Procedure: Glori Luis Cath Insertion;  Surgeon: Katha Cabal, MD;  Location: [place] [facility];  Service: Cardiovascular;  Laterality: N/A;    Current Outpatient Rx  Name  Route  Sig  Dispense  Refill  . busPIRone (BUSPAR) 5 MG tablet      TAKE ONE TABLET BY MOUTH TWICE DAILY   60 tablet   6   . dexamethasone (DECADRON) 4 MG tablet      Take 1 tablet by mouth at 8am and 5pm on day 2 and day 3 after chemotherapy.   30 tablet   0   . folic acid (FOLVITE) 1 MG tablet   Oral   Take 1 tablet (1 mg total) by mouth daily.   30 tablet   3   . levofloxacin (LEVAQUIN) 750 MG tablet   Oral   Take 1 tablet (750 mg total) by mouth daily.   10 tablet   0   . lidocaine-prilocaine (EMLA) cream   Topical   Apply 1 application topically as needed.   30 g   2   . ondansetron (ZOFRAN) 8 MG tablet   Oral   Take 1 tablet (8 mg total) by mouth 2 (two) times daily as needed (Nausea or vomiting).  Start if needed on the third day after chemotherapy.   30 tablet   1   . SPIRIVA RESPIMAT 1.25 MCG/ACT AERS                 Dispense as written.   Dellis Anes 80-4.5 MCG/ACT inhaler                 Dispense as written.   . zolpidem (AMBIEN) 5 MG tablet   Oral   Take 1 tablet (5 mg total) by mouth at bedtime as needed for sleep.   30 tablet   0     Allergies Review of patient's allergies indicates no known allergies.  Family History  Problem Relation Age of Onset  . Cancer Father     Prostate Cancer  . Hypertension Father   . Stroke Father   . Hypertension Brother     Social History Social History  Substance Use Topics  . Smoking status: Former Smoker -- 0.25 packs/day for 30 years    Types:  Cigarettes    Quit date: 02/06/2005  . Smokeless tobacco: Never Used  . Alcohol Use: 3.0 oz/week    5 Cans of beer per week     Comment: 5 beers weekly    Review of Systems Constitutional: Fever and chills Eyes: No visual changes. ENT: No sore throat. Cardiovascular: Denies chest pain. Respiratory: Nonproductive cough Gastrointestinal: Nausea with no abdominal pain or vomiting Genitourinary: Negative for dysuria. Musculoskeletal: Body aches Skin: Negative for rash. Neurological: Negative for headaches, focal weakness or numbness.  10-point ROS otherwise negative.  ____________________________________________   PHYSICAL EXAM:  VITAL SIGNS: ED Triage Vitals  Enc Vitals Group     BP 03/31/15 2307 131/87 mmHg     Pulse Rate 03/31/15 2307 85     Resp 03/31/15 2307 18     Temp 03/31/15 2307 98.4 F (36.9 C)     Temp Source 03/31/15 2307 Oral     SpO2 03/31/15 2307 93 %     Weight 03/31/15 2305 119 lb (53.978 kg)     Height 03/31/15 2305 '5\' 2"'$  (1.575 m)     Head Cir --      Peak Flow --      Pain Score 03/31/15 2306 7     Pain Loc --      Pain Edu? --      Excl. in [place]? --     Constitutional: Alert and oriented. Well appearing and in mild distress. Eyes: Conjunctivae are normal. PERRL. EOMI. Head: Atraumatic. Nose: No congestion/rhinnorhea. Mouth/Throat: Mucous membranes are moist.  Oropharynx non-erythematous. Cardiovascular: Normal rate, regular rhythm. Grossly normal heart sounds.  Good peripheral circulation. Respiratory: Normal respiratory effort.  No retractions. Lungs CTAB. Gastrointestinal: Soft and nontender. No distention. Positive bowel sounds Musculoskeletal: No lower extremity tenderness nor edema.   Neurologic:  Normal speech and language.  Skin:  Skin is warm, dry and intact.  Psychiatric: Mood and affect are normal.   ____________________________________________   LABS (all labs ordered are listed, but only abnormal results are displayed)  Labs  Reviewed  CBC - Abnormal; Notable for the following:    RBC 3.49 (*)    Hemoglobin 11.5 (*)    HCT 33.3 (*)    RDW 14.9 (*)    All other components within normal limits  BASIC METABOLIC PANEL - Abnormal; Notable for the following:    Sodium 133 (*)    CO2 21 (*)    Glucose, Bld 128 (*)  Calcium 8.8 (*)    All other components within normal limits  RAPID INFLUENZA A&B ANTIGENS ([facility] ONLY)  CULTURE, BLOOD (ROUTINE X 2)  CULTURE, BLOOD (ROUTINE X 2)   ____________________________________________  EKG  None ____________________________________________  RADIOLOGY  Chest x-ray: Right mid to lower lung field interstitial reticular densities and hazy opacities which may represent superimposed pneumonia or progression of underlying neoplastic process, overall there has been interval increase in the pulmonary densities compared to chest radiograph dated 01/18/15 ____________________________________________   PROCEDURES  Procedure(s) performed: None  Critical Care performed: No  ____________________________________________   INITIAL IMPRESSION / ASSESSMENT AND PLAN / ED COURSE  Pertinent labs & imaging results that were available during my care of the patient were reviewed by me and considered in my medical decision making (see chart for details).  This is a 60 year old female who comes to the hospital today with some low-grade temperature, chills, body aches and feeling unwell. Although the patient's blood work is unremarkable it appears as though she has some increased opacity on her chest x-ray. I did order some levofloxacin for the patient. The nurse did return to me saying that the patient did not want blood cultures and she did not want antibiotics. She reports that she does not have pneumonia as she has cancer. I contacted Dr. Mike Gip who is the oncologist on-call and she does recommend giving the patient some antibiotics. I did go back into the patient's room and she  reports that this is been a long journey where she has had many misdiagnoses. I informed the patient that I'm treating her based on her symptoms as well as the results. She reports though that she is not one to take the medication here in the hospital but she will take a prescription and discuss it with her oncologist. The patient reports that she is ready to go home. She will be discharged home to follow-up with her oncologist. ____________________________________________   FINAL CLINICAL IMPRESSION(S) / ED DIAGNOSES  Final diagnoses:  Fever, unspecified fever cause  Body aches  Community acquired pneumonia      Loney Hering, MD 04/01/15 [phone]

## 2015-04-01 NOTE — Telephone Encounter (Signed)
Called to report that she had fever and chills last night and went to ER and was told she possibly has pneumonia and was given rx for Levaquin. She is asking if she will still get her chemo treatment Deanna Blair / alimta) tomorrow. She is not down to see md. She states she no longer has fever. Please advise

## 2015-04-01 NOTE — Telephone Encounter (Signed)
Shuld be OK to get tx if on abx and her labs are ok per L Herring, AGNP-C Patient informed

## 2015-04-01 NOTE — Discharge Instructions (Signed)
Community-Acquired Pneumonia, Adult °Pneumonia is an infection of the lungs. There are different types of pneumonia. One type can develop while a person is in a hospital. A different type, called community-acquired pneumonia, develops in people who are not, or have not recently been, in the hospital or other health care facility.  °CAUSES °Pneumonia may be caused by bacteria, viruses, or funguses. Community-acquired pneumonia is often caused by Streptococcus pneumonia bacteria. These bacteria are often passed from one person to another by breathing in droplets from the cough or sneeze of an infected person. °RISK FACTORS °The condition is more likely to develop in: °· People who have chronic diseases, such as chronic obstructive pulmonary disease (COPD), asthma, congestive heart failure, cystic fibrosis, diabetes, or kidney disease. °· People who have early-stage or late-stage HIV. °· People who have sickle cell disease. °· People who have had their spleen removed (splenectomy). °· People who have poor dental hygiene. °· People who have medical conditions that increase the risk of breathing in (aspirating) secretions their own mouth and nose.   °· People who have a weakened immune system (immunocompromised). °· People who smoke. °· People who travel to areas where pneumonia-causing germs commonly exist. °· People who are around animal habitats or animals that have pneumonia-causing germs, including birds, bats, rabbits, cats, and farm animals. °SYMPTOMS °Symptoms of this condition include: °· A dry cough. °· A wet (productive) cough. °· Fever. °· Sweating. °· Chest pain, especially when breathing deeply or coughing. °· Rapid breathing or difficulty breathing. °· Shortness of breath. °· Shaking chills. °· Fatigue. °· Muscle aches. °DIAGNOSIS °Your health care provider will take a medical history and perform a physical exam. You may also have other tests, including: °· Imaging studies of your chest, including  X-rays. °· Tests to check your blood oxygen level and other blood gases. °· Other tests on blood, mucus (sputum), fluid around your lungs (pleural fluid), and urine. °If your pneumonia is severe, other tests may be done to identify the specific cause of your illness. °TREATMENT °The type of treatment that you receive depends on many factors, such as the cause of your pneumonia, the medicines you take, and other medical conditions that you have. For most adults, treatment and recovery from pneumonia may occur at home. In some cases, treatment must happen in a hospital. Treatment may include: °· Antibiotic medicines, if the pneumonia was caused by bacteria. °· Antiviral medicines, if the pneumonia was caused by a virus. °· Medicines that are given by mouth or through an IV tube. °· Oxygen. °· Respiratory therapy. °Although rare, treating severe pneumonia may include: °· Mechanical ventilation. This is done if you are not breathing well on your own and you cannot maintain a safe blood oxygen level. °· Thoracentesis. This procedure removes fluid around one lung or both lungs to help you breathe better. °HOME CARE INSTRUCTIONS °· Take over-the-counter and prescription medicines only as told by your health care provider. °¨ Only take cough medicine if you are losing sleep. Understand that cough medicine can prevent your body's natural ability to remove mucus from your lungs. °¨ If you were prescribed an antibiotic medicine, take it as told by your health care provider. Do not stop taking the antibiotic even if you start to feel better. °· Sleep in a semi-upright position at night. Try sleeping in a reclining chair, or place a few pillows under your head. °· Do not use tobacco products, including cigarettes, chewing tobacco, and e-cigarettes. If you need help quitting, ask your health care provider. °· Drink enough water to keep your urine   clear or pale yellow. This will help to thin out mucus secretions in your  lungs. PREVENTION There are ways that you can decrease your risk of developing community-acquired pneumonia. Consider getting a pneumococcal vaccine if:  You are older than 60 years of age.  You are older than 60 years of age and are undergoing cancer treatment, have chronic lung disease, or have other medical conditions that affect your immune system. Ask your health care provider if this applies to you. There are different types and schedules of pneumococcal vaccines. Ask your health care provider which vaccination option is best for you. You may also prevent community-acquired pneumonia if you take these actions:  Get an influenza vaccine every year. Ask your health care provider which type of influenza vaccine is best for you.  Go to the dentist on a regular basis.  Wash your hands often. Use hand sanitizer if soap and water are not available. SEEK MEDICAL CARE IF:  You have a fever.  You are losing sleep because you cannot control your cough with cough medicine. SEEK IMMEDIATE MEDICAL CARE IF:  You have worsening shortness of breath.  You have increased chest pain.  Your sickness becomes worse, especially if you are an older adult or have a weakened immune system.  You cough up blood.   This information is not intended to replace advice given to you by your health care provider. Make sure you discuss any questions you have with your health care provider.   Document Released: 12/21/2004 Document Revised: 09/11/2014 Document Reviewed: 04/17/2014 Elsevier Interactive Patient Education 2016 [place].  Fever, Adult A fever is an increase in the body's temperature. It is usually defined as a temperature of 100F (38C) or higher. Brief mild or moderate fevers generally have no long-term effects, and they often do not require treatment. Moderate or high fevers may make you feel uncomfortable and can sometimes be a sign of a serious illness or disease. The sweating that may  occur with repeated or prolonged fever may also cause dehydration. Fever is confirmed by taking a temperature with a thermometer. A measured temperature can vary with:  Age.  Time of day.  Location of the thermometer:  Mouth (oral).  Rectum (rectal).  Ear (tympanic).  Underarm (axillary).  Forehead (temporal). HOME CARE INSTRUCTIONS Pay attention to any changes in your symptoms. Take these actions to help with your condition:  Take over-the counter and prescription medicines only as told by your health care provider. Follow the dosing instructions carefully.  If you were prescribed an antibiotic medicine, take it as told by your health care provider. Do not stop taking the antibiotic even if you start to feel better.  Rest as needed.  Drink enough fluid to keep your urine clear or pale yellow. This helps to prevent dehydration.  Sponge yourself or bathe with room-temperature water to help reduce your body temperature as needed. Do not use ice water.  Do not overbundle yourself in blankets or heavy clothes. SEEK MEDICAL CARE IF:  You vomit.  You cannot eat or drink without vomiting.  You have diarrhea.  You have pain when you urinate.  Your symptoms do not improve with treatment.  You develop new symptoms.  You develop excessive weakness. SEEK IMMEDIATE MEDICAL CARE IF:  You have shortness of breath or have trouble breathing.  You are dizzy or you faint.  You are disoriented or confused.  You develop signs of dehydration, such as a dry mouth, decreased urination, or paleness.  You develop severe pain in your abdomen.  You have persistent vomiting or diarrhea.  You develop a skin rash.  Your symptoms suddenly get worse.   This information is not intended to replace advice given to you by your health care provider. Make sure you discuss any questions you have with your health care provider.   Document Released: 06/16/2000 Document Revised: 09/11/2014  Document Reviewed: 02/14/2014 Elsevier Interactive Patient Education [employer].

## 2015-04-02 ENCOUNTER — Inpatient Hospital Stay: Payer: BLUE CROSS/BLUE SHIELD

## 2015-04-02 VITALS — BP 100/73 | HR 84 | Temp 99.2°F | Resp 18

## 2015-04-02 DIAGNOSIS — C3481 Malignant neoplasm of overlapping sites of right bronchus and lung: Secondary | ICD-10-CM

## 2015-04-02 DIAGNOSIS — C3431 Malignant neoplasm of lower lobe, right bronchus or lung: Secondary | ICD-10-CM | POA: Diagnosis not present

## 2015-04-02 MED ORDER — HEPARIN SOD (PORK) LOCK FLUSH 100 UNIT/ML IV SOLN
INTRAVENOUS | Status: AC
  Filled 2015-04-02: qty 5

## 2015-04-02 MED ORDER — CYANOCOBALAMIN 1000 MCG/ML IJ SOLN
1000.0000 ug | Freq: Once | INTRAMUSCULAR | Status: AC
  Administered 2015-04-02: 1000 ug via INTRAMUSCULAR
  Filled 2015-04-02: qty 1

## 2015-04-02 MED ORDER — CYANOCOBALAMIN 1000 MCG/ML IJ SOLN: 1000.0000 ug | Freq: Once | INTRAMUSCULAR | Status: AC

## 2015-04-02 MED ORDER — HEPARIN SOD (PORK) LOCK FLUSH 100 UNIT/ML IV SOLN
500.0000 [IU] | Freq: Once | INTRAVENOUS | Status: AC | PRN
  Administered 2015-04-02: 500 [IU]

## 2015-04-02 MED ORDER — SODIUM CHLORIDE 0.9 % IV SOLN
Freq: Once | INTRAVENOUS | Status: AC
  Administered 2015-04-02: 10:00:00 via INTRAVENOUS
  Filled 2015-04-02: qty 1000

## 2015-04-02 MED ORDER — SODIUM CHLORIDE 0.9 % IV SOLN
434.0000 mg | Freq: Once | INTRAVENOUS | Status: AC
  Administered 2015-04-02: 430 mg via INTRAVENOUS
  Filled 2015-04-02: qty 43

## 2015-04-02 MED ORDER — SODIUM CHLORIDE 0.9 % IV SOLN
500.0000 mg/m2 | Freq: Once | INTRAVENOUS | Status: AC
  Administered 2015-04-02: 750 mg via INTRAVENOUS
  Filled 2015-04-02: qty 24

## 2015-04-02 MED ORDER — PALONOSETRON HCL INJECTION 0.25 MG/5ML
0.2500 mg | Freq: Once | INTRAVENOUS | Status: AC
  Administered 2015-04-02: 0.25 mg via INTRAVENOUS
  Filled 2015-04-02: qty 5

## 2015-04-02 MED ORDER — SODIUM CHLORIDE 0.9 % IV SOLN
Freq: Once | INTRAVENOUS | Status: AC
  Administered 2015-04-02: 10:00:00 via INTRAVENOUS
  Filled 2015-04-02: qty 5

## 2015-04-07 ENCOUNTER — Telehealth: Payer: Self-pay | Admitting: *Deleted

## 2015-04-07 NOTE — Telephone Encounter (Signed)
Called to ask what she can take for the cough, has not taken anything for it. Denies fever or other symptoms, she is currently on abx and has 2 more days of this. Advised to try OTC Robitussin or Delsym adn to call back Tuesday PM or Wed AM if not improved. She repeated this back to me

## 2015-04-09 ENCOUNTER — Other Ambulatory Visit: Payer: Self-pay | Admitting: Family Medicine

## 2015-04-09 ENCOUNTER — Inpatient Hospital Stay: Payer: BLUE CROSS/BLUE SHIELD | Attending: Oncology

## 2015-04-09 ENCOUNTER — Telehealth: Payer: Self-pay | Admitting: Physician Assistant

## 2015-04-09 ENCOUNTER — Telehealth: Payer: Self-pay | Admitting: *Deleted

## 2015-04-09 ENCOUNTER — Ambulatory Visit (INDEPENDENT_AMBULATORY_CARE_PROVIDER_SITE_OTHER): Payer: BLUE CROSS/BLUE SHIELD | Admitting: Physician Assistant

## 2015-04-09 ENCOUNTER — Other Ambulatory Visit: Payer: Self-pay

## 2015-04-09 ENCOUNTER — Encounter: Payer: Self-pay | Admitting: Physician Assistant

## 2015-04-09 VITALS — BP 130/80 | HR 80 | Temp 98.1°F | Resp 16 | Wt 115.8 lb

## 2015-04-09 DIAGNOSIS — Z87891 Personal history of nicotine dependence: Secondary | ICD-10-CM | POA: Insufficient documentation

## 2015-04-09 DIAGNOSIS — Z8701 Personal history of pneumonia (recurrent): Secondary | ICD-10-CM | POA: Diagnosis not present

## 2015-04-09 DIAGNOSIS — B373 Candidiasis of vulva and vagina: Secondary | ICD-10-CM

## 2015-04-09 DIAGNOSIS — F418 Other specified anxiety disorders: Secondary | ICD-10-CM | POA: Diagnosis not present

## 2015-04-09 DIAGNOSIS — Z79899 Other long term (current) drug therapy: Secondary | ICD-10-CM | POA: Insufficient documentation

## 2015-04-09 DIAGNOSIS — B379 Candidiasis, unspecified: Secondary | ICD-10-CM

## 2015-04-09 DIAGNOSIS — C3481 Malignant neoplasm of overlapping sites of right bronchus and lung: Secondary | ICD-10-CM | POA: Diagnosis present

## 2015-04-09 DIAGNOSIS — Z5111 Encounter for antineoplastic chemotherapy: Secondary | ICD-10-CM | POA: Diagnosis not present

## 2015-04-09 DIAGNOSIS — B3731 Acute candidiasis of vulva and vagina: Secondary | ICD-10-CM

## 2015-04-09 LAB — CBC WITH DIFFERENTIAL/PLATELET
Basophils Absolute: 0 10*3/uL (ref 0–0.1)
EOS ABS: 0 10*3/uL (ref 0–0.7)
Eosinophils Relative: 1 %
HCT: 34.3 % — ABNORMAL LOW (ref 35.0–47.0)
HEMOGLOBIN: 12.1 g/dL (ref 12.0–16.0)
Lymphocytes Relative: 39 %
Lymphs Abs: 0.3 10*3/uL — ABNORMAL LOW (ref 1.0–3.6)
MCH: 33.3 pg (ref 26.0–34.0)
MCHC: 35.3 g/dL (ref 32.0–36.0)
MCV: 94.5 fL (ref 80.0–100.0)
Monocytes Absolute: 0.1 10*3/uL — ABNORMAL LOW (ref 0.2–0.9)
Monocytes Relative: 15 %
Neutro Abs: 0.4 10*3/uL — ABNORMAL LOW (ref 1.4–6.5)
Platelets: 139 10*3/uL — ABNORMAL LOW (ref 150–440)
RBC: 3.63 MIL/uL — AB (ref 3.80–5.20)
RDW: 14.5 % (ref 11.5–14.5)
WBC: 0.9 10*3/uL — AB (ref 3.6–11.0)

## 2015-04-09 MED ORDER — FLUCONAZOLE 150 MG PO TABS: 150.0000 mg | ORAL_TABLET | Freq: Once | ORAL | Status: DC

## 2015-04-09 MED ORDER — LEVOFLOXACIN 500 MG PO TABS: 500.0000 mg | ORAL_TABLET | Freq: Every day | ORAL | Status: DC

## 2015-04-09 MED ORDER — TERCONAZOLE 0.4 % VA CREA: 1.0000 | TOPICAL_CREAM | Freq: Every day | VAGINAL | Status: DC

## 2015-04-09 NOTE — Telephone Encounter (Signed)
Called to report that Delsym has helped her cough, but she is going to see her PCP this morning for what she thinks is thrush. She will get her labs drawn after seeing PCP

## 2015-04-09 NOTE — Telephone Encounter (Signed)
Spoke with Hassan Rowan Triage nurse (Direct number: [phone] ) and asked about what would they recommend. She states sometimes the doctor do prescribed Diflucan and that the patient was the one that called them that the pharmacy declined the medication. Nothing is flag she is not sure to call the pharmacy. Called the pharmacy and spoke with [place]. Per Alyse Low Diflucan interferes with Levaquin and it can cause arrhythmia. They recommend OTC Monistat 3 or 7. Or to prescribed Terconazole.  Thanks,  -Jaz Mallick

## 2015-04-09 NOTE — Telephone Encounter (Signed)
Patient advised as directed below.  Thanks,  -Kalie Cabral 

## 2015-04-09 NOTE — Telephone Encounter (Signed)
-----   Message from Evlyn Kanner, NP sent at 04/09/2015 12:55 PM EDT ----- Sent in Levaquin x 5 days [facility]

## 2015-04-09 NOTE — Progress Notes (Signed)
Patient: Deanna Blair Female    DOB: 1955/06/23   60 y.o.   MRN: [id number] Visit Date: 04/09/2015  Today's Provider: Mar Daring, PA-C   Chief Complaint  Patient presents with  . Vaginal Itching   Subjective:    Vaginal Itching The patient's primary symptoms include genital itching. The patient's pertinent negatives include no genital lesions, genital odor, genital rash, vaginal bleeding or vaginal discharge. Primary symptoms comment: She reports that is coming from her Chemo that is she is getting for lung cancer of right lower lobe.. This is a new problem. The current episode started in the past 7 days (The day before yesterday). The problem occurs constantly. The problem has been unchanged. The patient is experiencing no pain. The problem affects both sides. She is not pregnant. Pertinent negatives include no abdominal pain, back pain, chills, diarrhea, dysuria, fever, flank pain, frequency, hematuria, rash, urgency or vomiting. The symptoms are aggravated by urinating (It burns.). She has tried nothing for the symptoms.  She also has been on levaquin for an infection as well.    No Known Allergies Previous Medications   BUSPIRONE (BUSPAR) 5 MG TABLET    TAKE ONE TABLET BY MOUTH TWICE DAILY   DEXAMETHASONE (DECADRON) 4 MG TABLET    Take 1 tablet by mouth at 8am and 5pm on day 2 and day 3 after chemotherapy.   FOLIC ACID (FOLVITE) 1 MG TABLET    Take 1 tablet (1 mg total) by mouth daily.   LEVOFLOXACIN (LEVAQUIN) 750 MG TABLET    Take 1 tablet (750 mg total) by mouth daily.   LIDOCAINE-PRILOCAINE (EMLA) CREAM    Apply 1 application topically as needed.   ONDANSETRON (ZOFRAN) 8 MG TABLET    Take 1 tablet (8 mg total) by mouth 2 (two) times daily as needed (Nausea or vomiting). Start if needed on the third day after chemotherapy.   SPIRIVA RESPIMAT 1.25 MCG/ACT AERS       SYMBICORT 80-4.5 MCG/ACT INHALER       ZOLPIDEM (AMBIEN) 5 MG TABLET    Take 1 tablet (5 mg  total) by mouth at bedtime as needed for sleep.    Review of Systems  Constitutional: Negative for fever, chills and fatigue.  Respiratory: Negative.   Cardiovascular: Negative.   Gastrointestinal: Negative for vomiting, abdominal pain and diarrhea.  Genitourinary: Negative for dysuria, urgency, frequency, hematuria, flank pain, vaginal bleeding, vaginal discharge and vaginal pain.       Genital itching  Musculoskeletal: Negative for back pain.  Skin: Negative for rash.    Social History  Substance Use Topics  . Smoking status: Former Smoker -- 0.25 packs/day for 30 years    Types: Cigarettes    Quit date: 02/06/2005  . Smokeless tobacco: Never Used  . Alcohol Use: 3.0 oz/week    5 Cans of beer per week     Comment: 5 beers weekly   Objective:   BP 130/80 mmHg  Pulse 80  Temp(Src) 98.1 F (36.7 C) (Oral)  Resp 16  Wt 115 lb 12.8 oz (52.527 kg)  Physical Exam  Constitutional: She appears well-developed and well-nourished. No distress.  Cardiovascular: Normal rate, regular rhythm and normal heart sounds.  Exam reveals no gallop and no friction rub.   No murmur heard. Pulmonary/Chest: Effort normal. No respiratory distress. She has no wheezes. She has rales (RLL;coarse).  Skin: She is not diaphoretic.  Vitals reviewed.       Assessment &  Plan:     1. Yeast infection Secondary to chemo and antibiotic use. Will give diflucan as below. She is to call if symptoms do not improve or worsen. She also mentions metallic taste in mouth. Advised to call if this persists after medication and will send in nystatin soln to see if that helps. - fluconazole (DIFLUCAN) 150 MG tablet; Take 1 tablet (150 mg total) by mouth once. May take 2nd tab PO 48-72 hours after 1st if needed.  Dispense: 2 tablet; Refill: 0       Mar Daring, PA-C  [facility]

## 2015-04-09 NOTE — Telephone Encounter (Signed)
LMTCB for the triage nurse. Deanna Blair with the cancer center.  Thanks,  -Joseline

## 2015-04-09 NOTE — Telephone Encounter (Signed)
Spoke with patient regarding recommendations for how to manage taste alterations. Per pt preference, recommendations were emailed to pt from [email or web address]. Pt verbalized understanding and will callback if taste changes worsen or if is unable to eat or drink.

## 2015-04-09 NOTE — Addendum Note (Signed)
Addended by: Mar Daring on: 04/09/2015 02:44 PM   Modules accepted: Orders

## 2015-04-09 NOTE — Telephone Encounter (Signed)
Sent in terconazole vaginal cream for her to use x 7 days.

## 2015-04-09 NOTE — Patient Instructions (Signed)

## 2015-04-09 NOTE — Telephone Encounter (Signed)
Deanna Blair called stating that the pharmacy and the cancer center told her she cannot get the Diflucan because it interferes with her other medications.  If you can call in something else. Please advise.  Thanks,  -Joseline

## 2015-04-09 NOTE — Telephone Encounter (Signed)
Called and informed pt that blood counts are low and will need to continue taking antibiotics at this time. Prescription has been sent to pharmacy. Instructed pt to callback if develops fever. Pt verbalized understanding. Also, pt states is having increased metallic taste which decreases appetite, wants to know if there is anything else that can be done to prevent metallic taste. Informed pt that metallic taste is a side effect of chemotherapy and it is recommended to eat with plastic utensils. Informed pt that I will ask NP and call her back with NP's recommendations regarding metallic taste.

## 2015-04-09 NOTE — Telephone Encounter (Signed)
I have pended itraconazole to send in, but can we call the cancer center to see what they would recommend since OTC treatments have not worked and nothing flagged on my end for diflucan? Thanks.

## 2015-04-16 ENCOUNTER — Inpatient Hospital Stay: Payer: BLUE CROSS/BLUE SHIELD

## 2015-04-16 DIAGNOSIS — C3481 Malignant neoplasm of overlapping sites of right bronchus and lung: Secondary | ICD-10-CM | POA: Diagnosis not present

## 2015-04-16 LAB — CBC WITH DIFFERENTIAL/PLATELET
BASOS ABS: 0 10*3/uL (ref 0–0.1)
Basophils Relative: 0 %
EOS PCT: 0 %
Eosinophils Absolute: 0 10*3/uL (ref 0–0.7)
HEMATOCRIT: 28.5 % — AB (ref 35.0–47.0)
Hemoglobin: 10.2 g/dL — ABNORMAL LOW (ref 12.0–16.0)
LYMPHS ABS: 0.9 10*3/uL — AB (ref 1.0–3.6)
LYMPHS PCT: 38 %
MCH: 33.8 pg (ref 26.0–34.0)
MCHC: 35.7 g/dL (ref 32.0–36.0)
MCV: 94.8 fL (ref 80.0–100.0)
MONO ABS: 0.4 10*3/uL (ref 0.2–0.9)
Monocytes Relative: 17 %
NEUTROS ABS: 1.1 10*3/uL — AB (ref 1.4–6.5)
Neutrophils Relative %: 45 %
PLATELETS: 68 10*3/uL — AB (ref 150–440)
RBC: 3.01 MIL/uL — ABNORMAL LOW (ref 3.80–5.20)
RDW: 13.7 % (ref 11.5–14.5)
WBC: 2.5 10*3/uL — ABNORMAL LOW (ref 3.6–11.0)

## 2015-04-23 ENCOUNTER — Encounter: Payer: Self-pay | Admitting: Oncology

## 2015-04-23 ENCOUNTER — Inpatient Hospital Stay (HOSPITAL_BASED_OUTPATIENT_CLINIC_OR_DEPARTMENT_OTHER): Payer: BLUE CROSS/BLUE SHIELD | Admitting: Oncology

## 2015-04-23 ENCOUNTER — Telehealth: Payer: Self-pay | Admitting: Pharmacist

## 2015-04-23 ENCOUNTER — Inpatient Hospital Stay: Payer: BLUE CROSS/BLUE SHIELD

## 2015-04-23 VITALS — BP 115/70 | HR 64 | Temp 96.6°F | Resp 18 | Wt 120.2 lb

## 2015-04-23 DIAGNOSIS — C3481 Malignant neoplasm of overlapping sites of right bronchus and lung: Secondary | ICD-10-CM

## 2015-04-23 DIAGNOSIS — F418 Other specified anxiety disorders: Secondary | ICD-10-CM

## 2015-04-23 DIAGNOSIS — Z87891 Personal history of nicotine dependence: Secondary | ICD-10-CM | POA: Diagnosis not present

## 2015-04-23 DIAGNOSIS — Z79899 Other long term (current) drug therapy: Secondary | ICD-10-CM

## 2015-04-23 DIAGNOSIS — Z8701 Personal history of pneumonia (recurrent): Secondary | ICD-10-CM

## 2015-04-23 LAB — CBC WITH DIFFERENTIAL/PLATELET
BASOS ABS: 0 10*3/uL (ref 0–0.1)
BASOS PCT: 0 %
Eosinophils Absolute: 0 10*3/uL (ref 0–0.7)
Eosinophils Relative: 1 %
HEMATOCRIT: 29.7 % — AB (ref 35.0–47.0)
HEMOGLOBIN: 10.5 g/dL — AB (ref 12.0–16.0)
Lymphocytes Relative: 27 %
Lymphs Abs: 1 10*3/uL (ref 1.0–3.6)
MCH: 34.4 pg — ABNORMAL HIGH (ref 26.0–34.0)
MCHC: 35.4 g/dL (ref 32.0–36.0)
MCV: 97.1 fL (ref 80.0–100.0)
MONO ABS: 0.6 10*3/uL (ref 0.2–0.9)
Monocytes Relative: 17 %
NEUTROS ABS: 1.9 10*3/uL (ref 1.4–6.5)
NEUTROS PCT: 55 %
Platelets: 411 10*3/uL (ref 150–440)
RBC: 3.06 MIL/uL — ABNORMAL LOW (ref 3.80–5.20)
RDW: 14 % (ref 11.5–14.5)
WBC: 3.6 10*3/uL (ref 3.6–11.0)

## 2015-04-23 LAB — COMPREHENSIVE METABOLIC PANEL
ALBUMIN: 3.9 g/dL (ref 3.5–5.0)
ALT: 29 U/L (ref 14–54)
AST: 57 U/L — AB (ref 15–41)
Alkaline Phosphatase: 65 U/L (ref 38–126)
Anion gap: 6 (ref 5–15)
BILIRUBIN TOTAL: 0.5 mg/dL (ref 0.3–1.2)
BUN: 11 mg/dL (ref 6–20)
CO2: 22 mmol/L (ref 22–32)
Calcium: 8.5 mg/dL — ABNORMAL LOW (ref 8.9–10.3)
Chloride: 106 mmol/L (ref 101–111)
Creatinine, Ser: 0.65 mg/dL (ref 0.44–1.00)
GFR calc Af Amer: >60 mL/min (ref >60)
GFR calc non Af Amer: >60 mL/min (ref >60)
GLUCOSE: 162 mg/dL — AB (ref 65–99)
POTASSIUM: 3.7 mmol/L (ref 3.5–5.1)
Sodium: 134 mmol/L — ABNORMAL LOW (ref 135–145)
TOTAL PROTEIN: 6.9 g/dL (ref 6.5–8.1)

## 2015-04-23 LAB — MAGNESIUM: Magnesium: 2.1 mg/dL (ref 1.7–2.4)

## 2015-04-23 MED ORDER — SODIUM CHLORIDE 0.9% FLUSH
10.0000 mL | Freq: Once | INTRAVENOUS | Status: AC
  Administered 2015-04-23: 10 mL via INTRAVENOUS
  Filled 2015-04-23: qty 10

## 2015-04-23 MED ORDER — FOSAPREPITANT DIMEGLUMINE INJECTION 150 MG
Freq: Once | INTRAVENOUS | Status: AC
  Administered 2015-04-23: 13:00:00 via INTRAVENOUS
  Filled 2015-04-23: qty 5

## 2015-04-23 MED ORDER — HEPARIN SOD (PORK) LOCK FLUSH 100 UNIT/ML IV SOLN
500.0000 [IU] | Freq: Once | INTRAVENOUS | Status: AC
  Administered 2015-04-23: 500 [IU] via INTRAVENOUS
  Filled 2015-04-23: qty 5

## 2015-04-23 MED ORDER — SODIUM CHLORIDE 0.9 % IV SOLN
434.0000 mg | Freq: Once | INTRAVENOUS | Status: AC
  Administered 2015-04-23: 430 mg via INTRAVENOUS
  Filled 2015-04-23: qty 43

## 2015-04-23 MED ORDER — CYANOCOBALAMIN 1000 MCG/ML IJ SOLN: 1000.0000 ug | Freq: Once | INTRAMUSCULAR | Status: DC

## 2015-04-23 MED ORDER — SODIUM CHLORIDE 0.9 % IV SOLN
Freq: Once | INTRAVENOUS | Status: AC
  Administered 2015-04-23: 12:00:00 via INTRAVENOUS
  Filled 2015-04-23: qty 1000

## 2015-04-23 MED ORDER — PALONOSETRON HCL INJECTION 0.25 MG/5ML
0.2500 mg | Freq: Once | INTRAVENOUS | Status: AC
  Administered 2015-04-23: 0.25 mg via INTRAVENOUS
  Filled 2015-04-23: qty 5

## 2015-04-23 MED ORDER — SODIUM CHLORIDE 0.9 % IV SOLN
500.0000 mg/m2 | Freq: Once | INTRAVENOUS | Status: AC
  Administered 2015-04-23: 750 mg via INTRAVENOUS
  Filled 2015-04-23: qty 24

## 2015-04-23 NOTE — Progress Notes (Signed)
[place] @ [facility] [phone]  Fax:([phone]   INITIAL CONSULT  Deanna Blair OB: [birth year]  MR#: [id number]  TIW#:[id number]  Patient Care Team: Mar Daring, PA-C as PCP - General (Family Medicine) Allyne Gee, MD as Referring Physician (Internal Medicine)  CHIEF COMPLAINT:  Chief Complaint  Patient presents with  . 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 2.  Start chemotherapy with carboplatinum and Alimta from March 8 ,2017  VISIT DIAGNOSIS:  Abnormal CT scan of chest  No history exists.      INTERVAL HISTORY:  60 year old gentleman lady who had a previous history of smoking for last 30 years patient quit smoking approximately a few years ago.   Recent is here for further evaluation and treatment consideration.  Has finished total 2 cycles of chemotherapy with carboplatinum and Alimta are tolerating treatment very well.  No nausea no vomiting no diarrhea appetite has been fairly good.  No chills.  No hemoptysis no chest pain patient does not smoke.    REVIEW OF SYSTEMS:   GENERAL:  Feels good.  Active.  No fevers, sweats or weight loss. PERFORMANCE STATUS (ECOG):  0 HEENT:  No visual changes, runny nose, sore throat, mouth sores or tenderness. Lungs: Increasing cough and shortness of breath Cardiac:  No chest pain, palpitations, orthopnea, or PND. GI:  No nausea, vomiting, diarrhea, constipation, melena or hematochezia. GU:  No urgency, frequency, dysuria, or hematuria. Musculoskeletal:  No back pain.  No joint pain.  No muscle tenderness. Extremities:  No pain or swelling. Skin:  No rashes or skin changes. Neuro:  No headache, numbness or weakness, balance or coordination issues. Endocrine:  No diabetes, thyroid issues, hot flashes or night sweats. Psych:  No mood changes, depression he had patient's's family is  slightly apprehensive and anxious Pain:  No focal pain. Review of systems:  All other systems reviewed and found to be negative.  As per HPI. Otherwise, a complete review of systems is negatve.  PAST MEDICAL HISTORY: Past Medical History  Diagnosis Date  . Depression with anxiety     Well controlled with Wellbutrin and Buspar  . Pneumonia   . Anxiety   . Depression   . Cancer of right lung ([place]) 02/13/2015    PAST SURGICAL HISTORY: Past Surgical History  Procedure Laterality Date  . Ectopic pregnancy surgery  1988  . Endobronchial ultrasound N/A 02/10/2015    Procedure: ENDOBRONCHIAL ULTRASOUND;  Surgeon: Flora Lipps, MD;  Location: [facility] ORS;  Service: Cardiopulmonary;  Laterality: N/A;  . Electromagnetic navigation brochoscopy N/A 02/10/2015    Procedure: ELECTROMAGNETIC NAVIGATION BRONCHOSCOPY;  Surgeon: Flora Lipps, MD;  Location: [facility] OR[facility];  Service: Cardiopulmonary;  Laterality: N/A;  . Peripheral vascular catheterization N/A 03/05/2015    Procedure: Glori Luis Cath Insertion;  Surgeon: Katha Cabal, MD;  Location: [place] [facility];  Service: Cardiovascular;  Laterality: N/A;    FAMILY HISTORY Family History  Problem Relation Age of Onset  . Cancer Father     Prostate Cancer  . Hypertension Father   . Stroke Father   . Hypertension Brother     GYNECOLOGIC HISTORY:  No LMP recorded. Patient is postmenopausal.     ADVANCED DIRECTIVES:  Patient does not have any living will or healthcare power of attorney.  Information was given .  Available resources had been discussed.  We will follow-up on subsequent  appointments regarding this issue  HEALTH MAINTENANCE: Social History  Substance Use Topics  . Smoking status: Former Smoker -- 0.25 packs/day for 30 years    Types: Cigarettes    Quit date: 02/06/2005  . Smokeless tobacco: Never Used  . Alcohol Use: 3.0 oz/week    5 Cans of beer per week     Comment: 5 beers weekly   :  No Known Allergies  Current Outpatient  Prescriptions  Medication Sig Dispense Refill  . busPIRone (BUSPAR) 5 MG tablet TAKE ONE TABLET BY MOUTH TWICE DAILY 60 tablet 6  . dexamethasone (DECADRON) 4 MG tablet Take 1 tablet by mouth at 8am and 5pm on day 2 and day 3 after chemotherapy. 30 tablet 0  . folic acid (FOLVITE) 1 MG tablet Take 1 tablet (1 mg total) by mouth daily. 30 tablet 3  . lidocaine-prilocaine (EMLA) cream Apply 1 application topically as needed. 30 g 2  . ondansetron (ZOFRAN) 8 MG tablet Take 1 tablet (8 mg total) by mouth 2 (two) times daily as needed (Nausea or vomiting). Start if needed on the third day after chemotherapy. 30 tablet 1  . SPIRIVA RESPIMAT 1.25 MCG/ACT AERS     . SYMBICORT 80-4.5 MCG/ACT inhaler     . terconazole (TERAZOL 7) 0.4 % vaginal cream Place 1 applicator vaginally at bedtime. X 7 days 45 g 0  . zolpidem (AMBIEN) 5 MG tablet Take 1 tablet (5 mg total) by mouth at bedtime as needed for sleep. 30 tablet 0  . levofloxacin (LEVAQUIN) 500 MG tablet      No current facility-administered medications for this visit.   Facility-Administered Medications Ordered in Other Visits  Medication Dose Route Frequency Provider Last Rate Last Dose  . heparin lock flush 100 unit/mL  500 Units Intravenous Once Forest Gleason, MD        OBJECTIVE: PHYSICAL EXAM  General  status: Performance status is good.  Patient has not lost significant weight. Since last evaluation there is no significant change in the general status HEENT: No evidence of stomatitis. Sclera and conjunctivae :: No jaundice.   pale looking. Lungs: Air  entry equal on both sides.  Coarse crepitation in the right lower lobe.  Cardiac: Heart sounds are normal.  No pericardial rub.  No murmur. Lymphatic system: Cervical, axillary, inguinal, lymph nodes not palpable GI: Abdomen is soft.liver and spleen not palpable.  No ascites.  Bowel sounds are normal.  No other palpable masses.  No tenderness . Lower extremity: No edema Neurological system:  Higher functions, cranial nerves intact no evidence of peripheral neuropathy. Skin: No rash.  No ecchymosis.. No petechial hemorrhages  Filed Vitals:   04/23/15 1134  BP: 115/70  Pulse: 64  Temp: 96.6 F (35.9 C)  Resp: 18     Body mass index is 21.97 kg/(m^2).    ECOG FS:0 - Asymptomatic  LAB RESULTS:  Infusion on 04/23/2015  Component Date Value Ref Range Status  . WBC 04/23/2015 3.6  3.6 - 11.0 K/uL Final  . RBC 04/23/2015 3.06* 3.80 - 5.20 MIL/uL Final  . Hemoglobin 04/23/2015 10.5* 12.0 - 16.0 g/dL Final  . HCT 04/23/2015 29.7* 35.0 - 47.0 % Final  . MCV 04/23/2015 97.1  80.0 - 100.0 fL Final  . MCH 04/23/2015 34.4* 26.0 - 34.0 pg Final  . MCHC 04/23/2015 35.4  32.0 - 36.0 g/dL Final  . RDW 04/23/2015 14.0  11.5 - 14.5 % Final  . Platelets 04/23/2015 411  150 - 440 K/uL  Final  . Neutrophils Relative % 04/23/2015 55   Final  . Neutro Abs 04/23/2015 1.9  1.4 - 6.5 K/uL Final  . Lymphocytes Relative 04/23/2015 27   Final  . Lymphs Abs 04/23/2015 1.0  1.0 - 3.6 K/uL Final  . Monocytes Relative 04/23/2015 17   Final  . Monocytes Absolute 04/23/2015 0.6  0.2 - 0.9 K/uL Final  . Eosinophils Relative 04/23/2015 1   Final  . Eosinophils Absolute 04/23/2015 0.0  0 - 0.7 K/uL Final  . Basophils Relative 04/23/2015 0   Final  . Basophils Absolute 04/23/2015 0.0  0 - 0.1 K/uL Final  . Sodium 04/23/2015 134* 135 - 145 mmol/L Final  . Potassium 04/23/2015 3.7  3.5 - 5.1 mmol/L Final  . Chloride 04/23/2015 106  101 - 111 mmol/L Final  . CO2 04/23/2015 22  22 - 32 mmol/L Final  . Glucose, Bld 04/23/2015 162* 65 - 99 mg/dL Final  . BUN 04/23/2015 11  6 - 20 mg/dL Final  . Creatinine, Ser 04/23/2015 0.65  0.44 - 1.00 mg/dL Final  . Calcium 04/23/2015 8.5* 8.9 - 10.3 mg/dL Final  . Total Protein 04/23/2015 6.9  6.5 - 8.1 g/dL Final  . Albumin 04/23/2015 3.9  3.5 - 5.0 g/dL Final  . AST 04/23/2015 57* 15 - 41 U/L Final  . ALT 04/23/2015 29  14 - 54 U/L Final  . Alkaline Phosphatase  04/23/2015 65  38 - 126 U/L Final  . Total Bilirubin 04/23/2015 0.5  0.3 - 1.2 mg/dL Final  . GFR calc non Af Amer 04/23/2015 >60  >60 mL/min Final  . GFR calc Af Amer 04/23/2015 >60  >60 mL/min Final   Comment: (NOTE) The eGFR has been calculated using the CKD EPI equation. This calculation has not been validated in all clinical situations. eGFR's persistently <60 mL/min signify possible Chronic Kidney Disease.   . Anion gap 04/23/2015 6  5 - 15 Final  . Magnesium 04/23/2015 2.1  1.7 - 2.4 mg/dL Final      ASSESSMENT:  Pulmonary infiltrate with middle lobe and lower lobe of the lung positive for adenocarcinoma  Will continue total 3 cycles of chemotherapy area did a repeat CT scan has been ordered.  Reevaluate patient after CT scan and decide whether any further chemotherapy versus radiation therapy  All lab data has been reviewed No matching staging information was found for the patient.  Forest Gleason, MD   04/23/2015 11:52 AM

## 2015-04-23 NOTE — Telephone Encounter (Signed)
Called MD. Patient just got B12   2 weeks ago. He wants to discontinue b12 for today.

## 2015-04-30 ENCOUNTER — Inpatient Hospital Stay: Payer: BLUE CROSS/BLUE SHIELD

## 2015-04-30 DIAGNOSIS — C3481 Malignant neoplasm of overlapping sites of right bronchus and lung: Secondary | ICD-10-CM | POA: Diagnosis not present

## 2015-04-30 LAB — CBC WITH DIFFERENTIAL/PLATELET
Basophils Absolute: 0 10*3/uL (ref 0–0.1)
Basophils Relative: 1 %
EOS PCT: 1 %
Eosinophils Absolute: 0 10*3/uL (ref 0–0.7)
HEMATOCRIT: 30.8 % — AB (ref 35.0–47.0)
Hemoglobin: 10.9 g/dL — ABNORMAL LOW (ref 12.0–16.0)
LYMPHS PCT: 32 %
Lymphs Abs: 0.8 10*3/uL — ABNORMAL LOW (ref 1.0–3.6)
MCH: 34.5 pg — AB (ref 26.0–34.0)
MCHC: 35.4 g/dL (ref 32.0–36.0)
MCV: 97.3 fL (ref 80.0–100.0)
MONO ABS: 0.1 10*3/uL — AB (ref 0.2–0.9)
MONOS PCT: 6 %
NEUTROS ABS: 1.5 10*3/uL (ref 1.4–6.5)
Neutrophils Relative %: 60 %
PLATELETS: 279 10*3/uL (ref 150–440)
RBC: 3.17 MIL/uL — ABNORMAL LOW (ref 3.80–5.20)
RDW: 14.5 % (ref 11.5–14.5)
WBC: 2.5 10*3/uL — ABNORMAL LOW (ref 3.6–11.0)

## 2015-05-07 ENCOUNTER — Ambulatory Visit
Admission: RE | Admit: 2015-05-07 | Discharge: 2015-05-07 | Disposition: A | Discharge status: Home / Self Care | Payer: BLUE CROSS/BLUE SHIELD | Source: Ambulatory Visit | Attending: Oncology | Admitting: Oncology

## 2015-05-07 ENCOUNTER — Inpatient Hospital Stay: Payer: BLUE CROSS/BLUE SHIELD | Attending: Oncology

## 2015-05-07 DIAGNOSIS — C3481 Malignant neoplasm of overlapping sites of right bronchus and lung: Secondary | ICD-10-CM | POA: Insufficient documentation

## 2015-05-07 DIAGNOSIS — Z87891 Personal history of nicotine dependence: Secondary | ICD-10-CM | POA: Insufficient documentation

## 2015-05-07 DIAGNOSIS — Z8042 Family history of malignant neoplasm of prostate: Secondary | ICD-10-CM | POA: Diagnosis not present

## 2015-05-07 DIAGNOSIS — Z8701 Personal history of pneumonia (recurrent): Secondary | ICD-10-CM | POA: Insufficient documentation

## 2015-05-07 DIAGNOSIS — J90 Pleural effusion, not elsewhere classified: Secondary | ICD-10-CM | POA: Insufficient documentation

## 2015-05-07 DIAGNOSIS — R591 Generalized enlarged lymph nodes: Secondary | ICD-10-CM | POA: Insufficient documentation

## 2015-05-07 DIAGNOSIS — Z5111 Encounter for antineoplastic chemotherapy: Secondary | ICD-10-CM | POA: Insufficient documentation

## 2015-05-07 DIAGNOSIS — Z79899 Other long term (current) drug therapy: Secondary | ICD-10-CM | POA: Insufficient documentation

## 2015-05-07 DIAGNOSIS — F418 Other specified anxiety disorders: Secondary | ICD-10-CM | POA: Diagnosis not present

## 2015-05-07 LAB — CBC WITH DIFFERENTIAL/PLATELET
BASOS ABS: 0 10*3/uL (ref 0–0.1)
BASOS PCT: 0 %
EOS ABS: 0 10*3/uL (ref 0–0.7)
EOS PCT: 0 %
HCT: 27.2 % — ABNORMAL LOW (ref 35.0–47.0)
HEMOGLOBIN: 9.7 g/dL — AB (ref 12.0–16.0)
LYMPHS ABS: 0.8 10*3/uL — AB (ref 1.0–3.6)
Lymphocytes Relative: 21 %
MCH: 35.1 pg — ABNORMAL HIGH (ref 26.0–34.0)
MCHC: 35.7 g/dL (ref 32.0–36.0)
MCV: 98.2 fL (ref 80.0–100.0)
Monocytes Absolute: 0.5 10*3/uL (ref 0.2–0.9)
Monocytes Relative: 13 %
NEUTROS PCT: 66 %
Neutro Abs: 2.4 10*3/uL (ref 1.4–6.5)
PLATELETS: 112 10*3/uL — AB (ref 150–440)
RBC: 2.77 MIL/uL — AB (ref 3.80–5.20)
RDW: 14.4 % (ref 11.5–14.5)
WBC: 3.7 10*3/uL (ref 3.6–11.0)

## 2015-05-07 MED ORDER — IOPAMIDOL (ISOVUE-300) INJECTION 61%
75.0000 mL | Freq: Once | INTRAVENOUS | Status: AC | PRN
  Administered 2015-05-07: 75 mL via INTRAVENOUS

## 2015-05-14 ENCOUNTER — Inpatient Hospital Stay: Payer: BLUE CROSS/BLUE SHIELD

## 2015-05-14 DIAGNOSIS — C3481 Malignant neoplasm of overlapping sites of right bronchus and lung: Secondary | ICD-10-CM

## 2015-05-14 LAB — CBC WITH DIFFERENTIAL/PLATELET
BASOS ABS: 0 10*3/uL (ref 0–0.1)
BASOS PCT: 0 %
Eosinophils Absolute: 0.1 10*3/uL (ref 0–0.7)
Eosinophils Relative: 2 %
HEMATOCRIT: 30.8 % — AB (ref 35.0–47.0)
HEMOGLOBIN: 10.8 g/dL — AB (ref 12.0–16.0)
Lymphocytes Relative: 21 %
Lymphs Abs: 0.8 10*3/uL — ABNORMAL LOW (ref 1.0–3.6)
MCH: 35.5 pg — AB (ref 26.0–34.0)
MCHC: 35.1 g/dL (ref 32.0–36.0)
MCV: 101.1 fL — ABNORMAL HIGH (ref 80.0–100.0)
Monocytes Absolute: 0.4 10*3/uL (ref 0.2–0.9)
Monocytes Relative: 11 %
NEUTROS ABS: 2.5 10*3/uL (ref 1.4–6.5)
NEUTROS PCT: 66 %
Platelets: 450 10*3/uL — ABNORMAL HIGH (ref 150–440)
RBC: 3.05 MIL/uL — ABNORMAL LOW (ref 3.80–5.20)
RDW: 18.2 % — AB (ref 11.5–14.5)
WBC: 3.9 10*3/uL (ref 3.6–11.0)

## 2015-05-21 ENCOUNTER — Encounter: Payer: Self-pay | Admitting: Oncology

## 2015-05-21 ENCOUNTER — Inpatient Hospital Stay: Payer: BLUE CROSS/BLUE SHIELD

## 2015-05-21 ENCOUNTER — Inpatient Hospital Stay (HOSPITAL_BASED_OUTPATIENT_CLINIC_OR_DEPARTMENT_OTHER): Payer: BLUE CROSS/BLUE SHIELD | Admitting: Oncology

## 2015-05-21 VITALS — BP 161/103 | HR 64 | Temp 96.8°F | Resp 18 | Wt 120.2 lb

## 2015-05-21 VITALS — BP 158/76 | HR 54

## 2015-05-21 DIAGNOSIS — Z8042 Family history of malignant neoplasm of prostate: Secondary | ICD-10-CM

## 2015-05-21 DIAGNOSIS — F418 Other specified anxiety disorders: Secondary | ICD-10-CM

## 2015-05-21 DIAGNOSIS — Z79899 Other long term (current) drug therapy: Secondary | ICD-10-CM | POA: Diagnosis not present

## 2015-05-21 DIAGNOSIS — C3481 Malignant neoplasm of overlapping sites of right bronchus and lung: Secondary | ICD-10-CM

## 2015-05-21 DIAGNOSIS — Z87891 Personal history of nicotine dependence: Secondary | ICD-10-CM

## 2015-05-21 DIAGNOSIS — Z8701 Personal history of pneumonia (recurrent): Secondary | ICD-10-CM

## 2015-05-21 LAB — COMPREHENSIVE METABOLIC PANEL
ALT: 28 U/L (ref 14–54)
ANION GAP: 7 (ref 5–15)
AST: 46 U/L — ABNORMAL HIGH (ref 15–41)
Albumin: 4.4 g/dL (ref 3.5–5.0)
Alkaline Phosphatase: 65 U/L (ref 38–126)
BUN: 17 mg/dL (ref 6–20)
CHLORIDE: 107 mmol/L (ref 101–111)
CO2: 24 mmol/L (ref 22–32)
CREATININE: 0.62 mg/dL (ref 0.44–1.00)
Calcium: 9.2 mg/dL (ref 8.9–10.3)
GFR calc non Af Amer: >60 mL/min (ref >60)
Glucose, Bld: 98 mg/dL (ref 65–99)
POTASSIUM: 4.3 mmol/L (ref 3.5–5.1)
SODIUM: 138 mmol/L (ref 135–145)
Total Bilirubin: 0.7 mg/dL (ref 0.3–1.2)
Total Protein: 7.4 g/dL (ref 6.5–8.1)

## 2015-05-21 LAB — CBC WITH DIFFERENTIAL/PLATELET
BASOS ABS: 0 10*3/uL (ref 0–0.1)
Basophils Relative: 1 %
EOS PCT: 1 %
Eosinophils Absolute: 0.1 10*3/uL (ref 0–0.7)
HCT: 30.7 % — ABNORMAL LOW (ref 35.0–47.0)
HEMOGLOBIN: 10.8 g/dL — AB (ref 12.0–16.0)
LYMPHS PCT: 17 %
Lymphs Abs: 0.9 10*3/uL — ABNORMAL LOW (ref 1.0–3.6)
MCH: 36 pg — AB (ref 26.0–34.0)
MCHC: 35.4 g/dL (ref 32.0–36.0)
MCV: 101.6 fL — ABNORMAL HIGH (ref 80.0–100.0)
Monocytes Absolute: 0.6 10*3/uL (ref 0.2–0.9)
Monocytes Relative: 11 %
NEUTROS ABS: 3.6 10*3/uL (ref 1.4–6.5)
NEUTROS PCT: 70 %
PLATELETS: 298 10*3/uL (ref 150–440)
RBC: 3.02 MIL/uL — AB (ref 3.80–5.20)
RDW: 18.1 % — AB (ref 11.5–14.5)
WBC: 5.1 10*3/uL (ref 3.6–11.0)

## 2015-05-21 MED ORDER — CARBOPLATIN CHEMO INJECTION 600 MG/60ML
434.0000 mg | Freq: Once | INTRAVENOUS | Status: AC
  Administered 2015-05-21: 430 mg via INTRAVENOUS
  Filled 2015-05-21: qty 43

## 2015-05-21 MED ORDER — SODIUM CHLORIDE 0.9 % IV SOLN
Freq: Once | INTRAVENOUS | Status: AC
  Administered 2015-05-21: 11:00:00 via INTRAVENOUS
  Filled 2015-05-21: qty 1000

## 2015-05-21 MED ORDER — CYANOCOBALAMIN 1000 MCG/ML IJ SOLN
1000.0000 ug | Freq: Once | INTRAMUSCULAR | Status: AC
  Administered 2015-05-21: 1000 ug via INTRAMUSCULAR
  Filled 2015-05-21: qty 1

## 2015-05-21 MED ORDER — SODIUM CHLORIDE 0.9 % IV SOLN
500.0000 mg/m2 | Freq: Once | INTRAVENOUS | Status: AC
  Administered 2015-05-21: 750 mg via INTRAVENOUS
  Filled 2015-05-21: qty 24

## 2015-05-21 MED ORDER — SODIUM CHLORIDE 0.9% FLUSH
10.0000 mL | INTRAVENOUS | Status: DC | PRN
  Administered 2015-05-21: 10 mL
  Filled 2015-05-21: qty 10

## 2015-05-21 MED ORDER — SODIUM CHLORIDE 0.9 % IV SOLN
Freq: Once | INTRAVENOUS | Status: AC
  Administered 2015-05-21: 11:00:00 via INTRAVENOUS
  Filled 2015-05-21: qty 5

## 2015-05-21 MED ORDER — CYANOCOBALAMIN 1000 MCG/ML IJ SOLN: 1000.0000 ug | Freq: Once | INTRAMUSCULAR | Status: AC

## 2015-05-21 MED ORDER — PALONOSETRON HCL INJECTION 0.25 MG/5ML
0.2500 mg | Freq: Once | INTRAVENOUS | Status: AC
  Administered 2015-05-21: 0.25 mg via INTRAVENOUS
  Filled 2015-05-21: qty 5

## 2015-05-21 MED ORDER — HEPARIN SOD (PORK) LOCK FLUSH 100 UNIT/ML IV SOLN
500.0000 [IU] | Freq: Once | INTRAVENOUS | Status: AC | PRN
  Administered 2015-05-21: 500 [IU]
  Filled 2015-05-21: qty 5

## 2015-05-27 ENCOUNTER — Encounter: Payer: Self-pay | Admitting: Oncology

## 2015-05-27 NOTE — Progress Notes (Signed)
[place] @ [facility] [phone]  Fax:(336) [phone]   INITIAL CONSULT  Deanna Blair OB: [birth year]  MR#: [id number]  OEU#:[id number]  Patient Care Team: Mar Daring, PA-C as PCP - General (Family Medicine) Allyne Gee, MD as Referring Physician (Internal Medicine)  CHIEF COMPLAINT:  Chief Complaint  Patient presents with  . 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 2.  Start chemotherapy with carboplatinum and Alimta from March 8 ,2017.  3.   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      INTERVAL HISTORY:  60 year old gentleman lady who had a previous history of smoking for last 30 years patient quit smoking approximately a few years ago.   Recent is here for further evaluation and treatment consideration.  Has finished total 2 cycles of chemotherapy with carboplatinum and Alimta are tolerating treatment very well.  No nausea no vomiting no diarrhea appetite has been fairly good.  No chills.  No hemoptysis no chest pain patient does not smoke.  Patient is here and tingling fourth cycle of chemotherapy.  Patient had a repeat CT scan.  REVIEW OF SYSTEMS:   GENERAL:  Feels good.  Active.  No fevers, sweats or weight loss. PERFORMANCE STATUS (ECOG):  0 HEENT:  No visual changes, runny nose, sore throat, mouth sores or tenderness. Lungs: Increasing cough and shortness of breath Cardiac:  No chest pain, palpitations, orthopnea, or PND. GI:  No nausea, vomiting, diarrhea, constipation, melena or hematochezia. GU:  No urgency, frequency, dysuria, or hematuria. Musculoskeletal:  No back pain.  No joint pain.  No muscle tenderness. Extremities:  No pain or swelling. Skin:  No rashes or skin changes. Neuro:  No headache, numbness or  weakness, balance or coordination issues. Endocrine:  No diabetes, thyroid issues, hot flashes or night sweats. Psych:  No mood changes, depression he had patient's's family is slightly apprehensive and anxious Pain:  No focal pain. Review of systems:  All other systems reviewed and found to be negative.  As per HPI. Otherwise, a complete review of systems is negatve.  PAST MEDICAL HISTORY: Past Medical History  Diagnosis Date  . Depression with anxiety     Well controlled with Wellbutrin and Buspar  . Pneumonia   . Anxiety   . Depression   . Cancer of right lung ([place]) 02/13/2015    PAST SURGICAL HISTORY: Past Surgical History  Procedure Laterality Date  . Ectopic pregnancy surgery  1988  . Endobronchial ultrasound N/A 02/10/2015    Procedure: ENDOBRONCHIAL ULTRASOUND;  Surgeon: Flora Lipps, MD;  Location: [facility] ORS;  Service: Cardiopulmonary;  Laterality: N/A;  . Electromagnetic navigation brochoscopy N/A 02/10/2015    Procedure: ELECTROMAGNETIC NAVIGATION BRONCHOSCOPY;  Surgeon: Flora Lipps, MD;  Location: [facility] ORS;  Service: Cardiopulmonary;  Laterality: N/A;  . Peripheral vascular catheterization N/A 03/05/2015    Procedure: Glori Luis Cath Insertion;  Surgeon: Katha Cabal, MD;  Location: [place] [facility];  Service: Cardiovascular;  Laterality: N/A;    FAMILY HISTORY Family History  Problem Relation Age of Onset  . Cancer Father     Prostate Cancer  . Hypertension Father   . Stroke Father   . Hypertension Brother     GYNECOLOGIC HISTORY:  No LMP recorded. Patient is postmenopausal.  ADVANCED DIRECTIVES:  Patient does not have any living will or healthcare power of attorney.  Information was given .  Available resources had been discussed.  We will follow-up on subsequent appointments regarding this issue  HEALTH MAINTENANCE: Social History  Substance Use Topics  . Smoking status: Former Smoker -- 0.25 packs/day for 30 years    Types: Cigarettes    Quit date:  02/06/2005  . Smokeless tobacco: Never Used  . Alcohol Use: 3.0 oz/week    5 Cans of beer per week     Comment: 5 beers weekly   :  No Known Allergies  Current Outpatient Prescriptions  Medication Sig Dispense Refill  . busPIRone (BUSPAR) 5 MG tablet TAKE ONE TABLET BY MOUTH TWICE DAILY 60 tablet 6  . dexamethasone (DECADRON) 4 MG tablet Take 1 tablet by mouth at 8am and 5pm on day 2 and day 3 after chemotherapy. 30 tablet 0  . folic acid (FOLVITE) 1 MG tablet Take 1 tablet (1 mg total) by mouth daily. 30 tablet 3  . levofloxacin (LEVAQUIN) 500 MG tablet     . lidocaine-prilocaine (EMLA) cream Apply 1 application topically as needed. 30 g 2  . ondansetron (ZOFRAN) 8 MG tablet Take 1 tablet (8 mg total) by mouth 2 (two) times daily as needed (Nausea or vomiting). Start if needed on the third day after chemotherapy. 30 tablet 1  . SPIRIVA RESPIMAT 1.25 MCG/ACT AERS     . SYMBICORT 80-4.5 MCG/ACT inhaler     . terconazole (TERAZOL 7) 0.4 % vaginal cream Place 1 applicator vaginally at bedtime. X 7 days 45 g 0  . zolpidem (AMBIEN) 5 MG tablet Take 1 tablet (5 mg total) by mouth at bedtime as needed for sleep. 30 tablet 0   No current facility-administered medications for this visit.    OBJECTIVE: PHYSICAL EXAM  General  status: Performance status is good.  Patient has not lost significant weight. Since last evaluation there is no significant change in the general status HEENT: No evidence of stomatitis. Sclera and conjunctivae :: No jaundice.   pale looking. Lungs: Air  entry equal on both sides.  Coarse crepitation in the right lower lobe.  Cardiac: Heart sounds are normal.  No pericardial rub.  No murmur. Lymphatic system: Cervical, axillary, inguinal, lymph nodes not palpable GI: Abdomen is soft.liver and spleen not palpable.  No ascites.  Bowel sounds are normal.  No other palpable masses.  No tenderness . Lower extremity: No edema Neurological system: Higher functions, cranial  nerves intact no evidence of peripheral neuropathy. Skin: No rash.  No ecchymosis.. No petechial hemorrhages  Filed Vitals:   05/21/15 0928  BP: 161/103  Pulse: 64  Temp: 96.8 F (36 C)  Resp: 18     Body mass index is 21.97 kg/(m^2).    ECOG FS:0 - Asymptomatic  LAB RESULTS:  Appointment on 05/21/2015  Component Date Value Ref Range Status  . WBC 05/21/2015 5.1  3.6 - 11.0 K/uL Final  . RBC 05/21/2015 3.02* 3.80 - 5.20 MIL/uL Final  . Hemoglobin 05/21/2015 10.8* 12.0 - 16.0 g/dL Final  . HCT 05/21/2015 30.7* 35.0 - 47.0 % Final  . MCV 05/21/2015 101.6* 80.0 - 100.0 fL Final  . MCH 05/21/2015 36.0* 26.0 - 34.0 pg Final  . MCHC 05/21/2015 35.4  32.0 - 36.0 g/dL Final  . RDW 05/21/2015 18.1* 11.5 - 14.5 % Final  . Platelets 05/21/2015 298  150 - 440 K/uL Final  . Neutrophils Relative % 05/21/2015  70   Final  . Neutro Abs 05/21/2015 3.6  1.4 - 6.5 K/uL Final  . Lymphocytes Relative 05/21/2015 17   Final  . Lymphs Abs 05/21/2015 0.9* 1.0 - 3.6 K/uL Final  . Monocytes Relative 05/21/2015 11   Final  . Monocytes Absolute 05/21/2015 0.6  0.2 - 0.9 K/uL Final  . Eosinophils Relative 05/21/2015 1   Final  . Eosinophils Absolute 05/21/2015 0.1  0 - 0.7 K/uL Final  . Basophils Relative 05/21/2015 1   Final  . Basophils Absolute 05/21/2015 0.0  0 - 0.1 K/uL Final  . Sodium 05/21/2015 138  135 - 145 mmol/L Final  . Potassium 05/21/2015 4.3  3.5 - 5.1 mmol/L Final  . Chloride 05/21/2015 107  101 - 111 mmol/L Final  . CO2 05/21/2015 24  22 - 32 mmol/L Final  . Glucose, Bld 05/21/2015 98  65 - 99 mg/dL Final  . BUN 05/21/2015 17  6 - 20 mg/dL Final  . Creatinine, Ser 05/21/2015 0.62  0.44 - 1.00 mg/dL Final  . Calcium 05/21/2015 9.2  8.9 - 10.3 mg/dL Final  . Total Protein 05/21/2015 7.4  6.5 - 8.1 g/dL Final  . Albumin 05/21/2015 4.4  3.5 - 5.0 g/dL Final  . AST 05/21/2015 46* 15 - 41 U/L Final  . ALT 05/21/2015 28  14 - 54 U/L Final  . Alkaline Phosphatase 05/21/2015 65  38 - 126 U/L  Final  . Total Bilirubin 05/21/2015 0.7  0.3 - 1.2 mg/dL Final  . GFR calc non Af Amer 05/21/2015 >60  >60 mL/min Final  . GFR calc Af Amer 05/21/2015 >60  >60 mL/min Final   Comment: (NOTE) The eGFR has been calculated using the CKD EPI equation. This calculation has not been validated in all clinical situations. eGFR's persistently <60 mL/min signify possible Chronic Kidney Disease.   . Anion gap 05/21/2015 7  5 - 15 Final      ASSESSMENT:  Pulmonary infiltrate with middle lobe and lower lobe of the lung positive for adenocarcinoma  CT scan has been reviewed independently and reviewed with the patient. There was no significant change.  Proceed with a fourth cycle of chemotherapy Consider Guidant 360 test to see if there is any molecular markers available to target PET scan prior to next treatment detailed discussion with the patient was quite emotional at present time..  Another biopsy can be considered a molecular markers needs to be obtained  Total duration of visit was 30 minutes.  50% or more time was spent in counseling patient and family regarding prognosis and options of treatment and available resources   All lab data has been reviewed No matching staging information was found for the patient.  Forest Gleason, MD   05/27/2015 7:12 AM

## 2015-05-28 ENCOUNTER — Inpatient Hospital Stay: Payer: BLUE CROSS/BLUE SHIELD

## 2015-05-28 DIAGNOSIS — C3481 Malignant neoplasm of overlapping sites of right bronchus and lung: Secondary | ICD-10-CM

## 2015-05-28 LAB — CBC WITH DIFFERENTIAL/PLATELET
BASOS ABS: 0 10*3/uL (ref 0–0.1)
BASOS PCT: 1 %
EOS ABS: 0 10*3/uL (ref 0–0.7)
EOS PCT: 1 %
HCT: 31.4 % — ABNORMAL LOW (ref 35.0–47.0)
Hemoglobin: 11.1 g/dL — ABNORMAL LOW (ref 12.0–16.0)
LYMPHS PCT: 26 %
Lymphs Abs: 0.9 10*3/uL — ABNORMAL LOW (ref 1.0–3.6)
MCH: 36.2 pg — ABNORMAL HIGH (ref 26.0–34.0)
MCHC: 35.3 g/dL (ref 32.0–36.0)
MCV: 102.5 fL — AB (ref 80.0–100.0)
MONO ABS: 0.3 10*3/uL (ref 0.2–0.9)
Monocytes Relative: 9 %
Neutro Abs: 2.2 10*3/uL (ref 1.4–6.5)
Neutrophils Relative %: 63 %
PLATELETS: 198 10*3/uL (ref 150–440)
RBC: 3.06 MIL/uL — ABNORMAL LOW (ref 3.80–5.20)
RDW: 17 % — AB (ref 11.5–14.5)
WBC: 3.4 10*3/uL — ABNORMAL LOW (ref 3.6–11.0)

## 2015-06-03 ENCOUNTER — Other Ambulatory Visit: Payer: Self-pay | Admitting: Family Medicine

## 2015-06-04 ENCOUNTER — Inpatient Hospital Stay: Payer: BLUE CROSS/BLUE SHIELD

## 2015-06-04 DIAGNOSIS — C3481 Malignant neoplasm of overlapping sites of right bronchus and lung: Secondary | ICD-10-CM

## 2015-06-04 LAB — CBC WITH DIFFERENTIAL/PLATELET
BASOS ABS: 0 10*3/uL (ref 0–0.1)
Basophils Relative: 0 %
EOS ABS: 0.1 10*3/uL (ref 0–0.7)
EOS PCT: 1 %
HCT: 27.4 % — ABNORMAL LOW (ref 35.0–47.0)
Hemoglobin: 9.7 g/dL — ABNORMAL LOW (ref 12.0–16.0)
Lymphocytes Relative: 20 %
Lymphs Abs: 0.9 10*3/uL — ABNORMAL LOW (ref 1.0–3.6)
MCH: 36.9 pg — AB (ref 26.0–34.0)
MCHC: 35.3 g/dL (ref 32.0–36.0)
MCV: 104.6 fL — AB (ref 80.0–100.0)
MONO ABS: 0.5 10*3/uL (ref 0.2–0.9)
Monocytes Relative: 11 %
Neutro Abs: 3 10*3/uL (ref 1.4–6.5)
Neutrophils Relative %: 68 %
PLATELETS: 115 10*3/uL — AB (ref 150–440)
RBC: 2.62 MIL/uL — AB (ref 3.80–5.20)
RDW: 17.3 % — AB (ref 11.5–14.5)
WBC: 4.4 10*3/uL (ref 3.6–11.0)

## 2015-06-05 ENCOUNTER — Ambulatory Visit: Payer: BLUE CROSS/BLUE SHIELD

## 2015-06-11 ENCOUNTER — Inpatient Hospital Stay (HOSPITAL_BASED_OUTPATIENT_CLINIC_OR_DEPARTMENT_OTHER): Payer: BLUE CROSS/BLUE SHIELD | Admitting: Oncology

## 2015-06-11 ENCOUNTER — Ambulatory Visit
Admission: RE | Admit: 2015-06-11 | Discharge: 2015-06-11 | Disposition: A | Discharge status: Home / Self Care | Payer: BLUE CROSS/BLUE SHIELD | Source: Ambulatory Visit | Attending: Radiation Oncology | Admitting: Radiation Oncology

## 2015-06-11 ENCOUNTER — Encounter: Payer: Self-pay | Admitting: Radiation Oncology

## 2015-06-11 VITALS — BP 163/83 | HR 72 | Temp 98.3°F | Resp 20 | Wt 122.8 lb

## 2015-06-11 VITALS — BP 156/88 | HR 68 | Temp 96.8°F | Resp 18 | Wt 122.4 lb

## 2015-06-11 DIAGNOSIS — Z8701 Personal history of pneumonia (recurrent): Secondary | ICD-10-CM | POA: Diagnosis not present

## 2015-06-11 DIAGNOSIS — Z808 Family history of malignant neoplasm of other organs or systems: Secondary | ICD-10-CM | POA: Insufficient documentation

## 2015-06-11 DIAGNOSIS — F418 Other specified anxiety disorders: Secondary | ICD-10-CM | POA: Insufficient documentation

## 2015-06-11 DIAGNOSIS — C33 Malignant neoplasm of trachea: Secondary | ICD-10-CM | POA: Diagnosis not present

## 2015-06-11 DIAGNOSIS — Z87891 Personal history of nicotine dependence: Secondary | ICD-10-CM

## 2015-06-11 DIAGNOSIS — C3481 Malignant neoplasm of overlapping sites of right bronchus and lung: Secondary | ICD-10-CM

## 2015-06-11 DIAGNOSIS — Z79899 Other long term (current) drug therapy: Secondary | ICD-10-CM | POA: Diagnosis not present

## 2015-06-11 DIAGNOSIS — Z9221 Personal history of antineoplastic chemotherapy: Secondary | ICD-10-CM | POA: Diagnosis not present

## 2015-06-11 DIAGNOSIS — Z8042 Family history of malignant neoplasm of prostate: Secondary | ICD-10-CM | POA: Diagnosis not present

## 2015-06-11 NOTE — Progress Notes (Signed)
Radiation Oncology Follow up Note  Name: Deanna Blair   Date:   06/11/2015 MRN:  [id number] DOB: May 12, 1955    This 60 y.o. female presents to the clinic today for follow-up and reevaluation of known stage IIIB (T4 N2 M0) adenocarcinoma the right lung.  REFERRING PROVIDER: Florian Buff*  HPI: Patient is a 60 year old female originally consult back in February 2017 when 50 pack your smoking history presented with chronic cough and CT scan demonstrated a subtle right lower lobe infiltrate. Right lower lobe strandy mass suspicious for primary lung carcinoma. Paratracheal lymph node was seen and biopsy positive for metastatic adenocarcinoma. PET/CT scan demonstrated hypermetabolic activity in the right upper and right lower lobe. At that time I thought it was impossible to encompass all areas of hypermetabolic activity and I recommended upfront chemotherapy. Patient underwent 4 cycles of carboplatinum and Alimta which she tolerated well. 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 Repeat PET CT scan showed no significant change in overall pattern of both right upper and right lower lobe involvement. I've asked to reevaluate the patient at this time for possible radiation therapy. She is doing fairly well specifically denies cough hemoptysis or chest tightness.   COMPLICATIONS OF TREATMENT: none  FOLLOW UP COMPLIANCE: keeps appointments   PHYSICAL EXAM:  BP 163/83 mmHg  Pulse 72  Temp(Src) 98.3 F (36.8 C)  Resp 20  Wt 122 lb 12.7 oz (55.7 kg) Well-developed well-nourished patient in NAD. HEENT reveals PERLA, EOMI, discs not visualized.  Oral cavity is clear. No oral mucosal lesions are identified. Neck is clear without evidence of cervical or supraclavicular adenopathy. Lungs are clear to A&P. Cardiac examination is essentially unremarkable with regular rate and rhythm without murmur rub or thrill. Abdomen is benign with no organomegaly or masses noted. Motor sensory and DTR levels  are equal and symmetric in the upper and lower extremities. Cranial nerves II through XII are grossly intact. Proprioception is intact. No peripheral adenopathy or edema is identified. No motor or sensory levels are noted. Crude visual fields are within normal range.  RADIOLOGY RESULTS: Serial CT scans and PET/CT scans reviewed compatible above-stated findings  PLAN: At the present time I still believe it would be impossible to corporate all areas of disease in her right lung in a single or multiple treatment fields with radiation. I discussed the case personally with medical oncology and immunotherapy may be indicated at this time. I've also asked Dr. Faith Rogue surgical oncologist to review the scans evaluate the patient personally for possibility of surgical resection. At this time will continue to follow-through medical oncology. We'll be happy to reevaluate the patient any time should palliative treatment be indicated for progressive disease causing atelectasis of the lung, hemoptysis or pain. This was all explained in detail to the patient. She is seen medical oncology today for further treatment planning.  I would like to take this opportunity to thank you for allowing me to participate in the care of your patient.Armstead Peaks., MD

## 2015-06-11 NOTE — Progress Notes (Signed)
Patient states she had pain in her right chest wall about 4 days after her last treatment.  Port is also in right chest.

## 2015-06-12 ENCOUNTER — Other Ambulatory Visit: Payer: Self-pay | Admitting: *Deleted

## 2015-06-12 ENCOUNTER — Encounter: Payer: Self-pay | Admitting: Oncology

## 2015-06-12 NOTE — Progress Notes (Signed)
[place] @ [facility] [phone]  [phone]   INITIAL CONSULT  Deanna Blair OB: [birth year]  MR#: [id number]  WUX#:[id number]  Patient Care Team: Mar Daring, PA-C as PCP - General (Family Medicine) Allyne Gee, MD as Referring Physician (Internal Medicine)  CHIEF COMPLAINT:  Chief Complaint  Patient presents with  . 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 2.  Start chemotherapy with carboplatinum and Alimta from March 8 ,2017.  3.   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      INTERVAL HISTORY:  60 year old gentleman lady who had a previous history of smoking for last 30 years patient quit smoking approximately a few years ago.   Recent is here for further evaluation and treatment consideration.  Has finished total 2 cycles of chemotherapy with carboplatinum and Alimta are tolerating treatment very well.  No nausea no vomiting no diarrhea appetite has been fairly good.  No chills.  No hemoptysis no chest pain patient does not smoke.  Patient states she had pain in her right chest wall about 4 days after her last treatment. Port is also in right chest. Insurance did not approve PET scan.  Patient has been evaluated today by Dr. Donella Stade and I discussed situation with him he does not feel like patient cannot have radiation therapy in such an extensive area.  X-rays also had been evaluated by thoracic surgeon.  I discussed situation with Dr. Faith Rogue, cardiothoracic surgeon he would like to see her and evaluate for possibility of pneumonectomy Patient has finished total of 4 cycles of chemotherapy here for further evaluation  REVIEW OF SYSTEMS:   GENERAL:  Feels good.  Active.  No fevers, sweats or weight loss. PERFORMANCE  STATUS (ECOG):  0 HEENT:  No visual changes, runny nose, sore throat, mouth sores or tenderness. Lungs: Increasing cough and shortness of breath Cardiac:  No chest pain, palpitations, orthopnea, or PND. GI:  No nausea, vomiting, diarrhea, constipation, melena or hematochezia. GU:  No urgency, frequency, dysuria, or hematuria. Musculoskeletal:  No back pain.  No joint pain.  No muscle tenderness. Extremities:  No pain or swelling. Skin:  No rashes or skin changes. Neuro:  No headache, numbness or weakness, balance or coordination issues. Endocrine:  No diabetes, thyroid issues, hot flashes or night sweats. Psych:  No mood changes, depression he had patient's's family is slightly apprehensive and anxious Pain:  No focal pain. Review of systems:  All other systems reviewed and found to be negative.  As per HPI. Otherwise, a complete review of systems is negatve.  PAST MEDICAL HISTORY: Past Medical History  Diagnosis Date  . Depression with anxiety     Well controlled with Wellbutrin and Buspar  . Pneumonia   . Anxiety   . Depression   . Cancer of right lung ([place]) 02/13/2015    PAST SURGICAL HISTORY: Past Surgical History  Procedure Laterality Date  . Ectopic pregnancy surgery  1988  . Endobronchial ultrasound N/A 02/10/2015    Procedure: ENDOBRONCHIAL ULTRASOUND;  Surgeon: Flora Lipps, MD;  Location: [facility] ORS;  Service: Cardiopulmonary;  Laterality: N/A;  . Electromagnetic navigation brochoscopy N/A 02/10/2015    Procedure: ELECTROMAGNETIC NAVIGATION BRONCHOSCOPY;  Surgeon: Flora Lipps, MD;  Location: [facility] ORS;  Service: Cardiopulmonary;  Laterality: N/A;  . Peripheral vascular catheterization N/A 03/05/2015    Procedure: Glori Luis Cath Insertion;  Surgeon: Katha Cabal, MD;  Location: [place] [facility];  Service: Cardiovascular;  Laterality: N/A;    FAMILY HISTORY Family History  Problem Relation Age of Onset  . Cancer Father     Prostate Cancer  . Hypertension Father   .  Stroke Father   . Hypertension Brother     GYNECOLOGIC HISTORY:  No LMP recorded. Patient is postmenopausal.     ADVANCED DIRECTIVES:  Patient does not have any living will or healthcare power of attorney.  Information was given .  Available resources had been discussed.  We will follow-up on subsequent appointments regarding this issue  HEALTH MAINTENANCE: Social History  Substance Use Topics  . Smoking status: Former Smoker -- 0.25 packs/day for 30 years    Types: Cigarettes    Quit date: 02/06/2005  . Smokeless tobacco: Never Used  . Alcohol Use: 3.0 oz/week    5 Cans of beer per week     Comment: 5 beers weekly   :  No Known Allergies  Current Outpatient Prescriptions  Medication Sig Dispense Refill  . busPIRone (BUSPAR) 5 MG tablet TAKE ONE TABLET BY MOUTH TWICE DAILY 60 tablet 6  . dexamethasone (DECADRON) 4 MG tablet Take 1 tablet by mouth at 8am and 5pm on day 2 and day 3 after chemotherapy. 30 tablet 0  . folic acid (FOLVITE) 1 MG tablet Take 1 tablet (1 mg total) by mouth daily. 30 tablet 3  . lidocaine-prilocaine (EMLA) cream Apply 1 application topically as needed. 30 g 2  . ondansetron (ZOFRAN) 8 MG tablet Take 1 tablet (8 mg total) by mouth 2 (two) times daily as needed (Nausea or vomiting). Start if needed on the third day after chemotherapy. 30 tablet 1  . SPIRIVA RESPIMAT 1.25 MCG/ACT AERS     . SYMBICORT 80-4.5 MCG/ACT inhaler     . terconazole (TERAZOL 7) 0.4 % vaginal cream Place 1 applicator vaginally at bedtime. X 7 days 45 g 0  . zolpidem (AMBIEN) 5 MG tablet Take 1 tablet (5 mg total) by mouth at bedtime as needed for sleep. 30 tablet 0   No current facility-administered medications for this visit.    OBJECTIVE: PHYSICAL EXAM  General  status: Performance status is good.  Patient has not lost significant weight. Since last evaluation there is no significant change in the general status HEENT: No evidence of stomatitis. Sclera and conjunctivae :: No  jaundice.   pale looking. Lungs: Air  entry equal on both sides.  Coarse crepitation in the right lower lobe.  Cardiac: Heart sounds are normal.  No pericardial rub.  No murmur. Lymphatic system: Cervical, axillary, inguinal, lymph nodes not palpable GI: Abdomen is soft.liver and spleen not palpable.  No ascites.  Bowel sounds are normal.  No other palpable masses.  No tenderness . Lower extremity: No edema Neurological system: Higher functions, cranial nerves intact no evidence of peripheral neuropathy. Skin: No rash.  No ecchymosis.. No petechial hemorrhages  Filed Vitals:   06/11/15 0953  BP: 156/88  Pulse: 68  Temp: 96.8 F (36 C)  Resp: 18     Body mass index is 22.38 kg/(m^2).    ECOG FS:0 - Asymptomatic  LAB RESULTS:  No visits with results within 5 Day(s) from this visit. Latest known visit with results is:  Appointment on 06/04/2015  Component Date Value Ref Range Status  . WBC 06/04/2015  4.4  3.6 - 11.0 K/uL Final  . RBC 06/04/2015 2.62* 3.80 - 5.20 MIL/uL Final  . Hemoglobin 06/04/2015 9.7* 12.0 - 16.0 g/dL Final  . HCT 06/04/2015 27.4* 35.0 - 47.0 % Final  . MCV 06/04/2015 104.6* 80.0 - 100.0 fL Final  . MCH 06/04/2015 36.9* 26.0 - 34.0 pg Final  . MCHC 06/04/2015 35.3  32.0 - 36.0 g/dL Final  . RDW 06/04/2015 17.3* 11.5 - 14.5 % Final  . Platelets 06/04/2015 115* 150 - 440 K/uL Final  . Neutrophils Relative % 06/04/2015 68   Final  . Neutro Abs 06/04/2015 3.0  1.4 - 6.5 K/uL Final  . Lymphocytes Relative 06/04/2015 20   Final  . Lymphs Abs 06/04/2015 0.9* 1.0 - 3.6 K/uL Final  . Monocytes Relative 06/04/2015 11   Final  . Monocytes Absolute 06/04/2015 0.5  0.2 - 0.9 K/uL Final  . Eosinophils Relative 06/04/2015 1   Final  . Eosinophils Absolute 06/04/2015 0.1  0 - 0.7 K/uL Final  . Basophils Relative 06/04/2015 0   Final  . Basophils Absolute 06/04/2015 0.0  0 - 0.1 K/uL Final      ASSESSMENT:  Pulmonary infiltrate with middle lobe and lower lobe of the  lung positive for adenocarcinoma Discussion with Dr. Faith Rogue, cardiothoracic surgeon and Dr. Donella Stade, radiation oncologist Radiation was ruled out because of extensive area of involvement We are awaiting surgical opinion At this point in time patient has finished 4 cycles of treatment we would like to get a PET scan done to decide on further line of treatment surgical versus maintenance chemotherapy patient has significant response possibility of adding immuno  therapy along with chemotherapy  Since she been discussed with the patient and family. Patient will be evaluated by my associate Dr. Jacinto Reap, i  discussed situation with him If we can get PET scan and approved then depending on the PET scan activity we can decide whether to continue chemotherapy versus whole with any surgical intervention.  If we decide to continue chemotherapy carboplatin, Alimta and the kAYTRUDA may have to be considered.  Total duration of visit was 30 minutes.  50% or more time was spent in counseling patient and family regarding prognosis and options of treatment and available resources   All lab data has been reviewed No matching staging information was found for the patient.  Forest Gleason, MD   06/12/2015 10:07 PM

## 2015-06-17 ENCOUNTER — Encounter
Admission: RE | Admit: 2015-06-17 | Discharge: 2015-06-17 | Disposition: A | Discharge status: Home / Self Care | Payer: BLUE CROSS/BLUE SHIELD | Source: Ambulatory Visit | Attending: Oncology | Admitting: Oncology

## 2015-06-17 DIAGNOSIS — C3481 Malignant neoplasm of overlapping sites of right bronchus and lung: Secondary | ICD-10-CM | POA: Diagnosis present

## 2015-06-17 LAB — GLUCOSE, CAPILLARY: GLUCOSE-CAPILLARY: 92 mg/dL (ref 65–99)

## 2015-06-17 MED ORDER — FLUDEOXYGLUCOSE F - 18 (FDG) INJECTION
12.0000 | Freq: Once | INTRAVENOUS | Status: AC | PRN
  Administered 2015-06-17: 12.393 via INTRAVENOUS

## 2015-06-24 ENCOUNTER — Other Ambulatory Visit: Payer: Self-pay | Admitting: *Deleted

## 2015-06-24 ENCOUNTER — Other Ambulatory Visit: Payer: Self-pay | Admitting: Internal Medicine

## 2015-06-24 DIAGNOSIS — C33 Malignant neoplasm of trachea: Secondary | ICD-10-CM

## 2015-06-24 DIAGNOSIS — C3480 Malignant neoplasm of overlapping sites of unspecified bronchus and lung: Principal | ICD-10-CM

## 2015-06-24 DIAGNOSIS — C3481 Malignant neoplasm of overlapping sites of right bronchus and lung: Secondary | ICD-10-CM

## 2015-06-24 DIAGNOSIS — Z5111 Encounter for antineoplastic chemotherapy: Secondary | ICD-10-CM | POA: Insufficient documentation

## 2015-06-25 ENCOUNTER — Other Ambulatory Visit: Payer: BLUE CROSS/BLUE SHIELD

## 2015-06-25 ENCOUNTER — Inpatient Hospital Stay (HOSPITAL_BASED_OUTPATIENT_CLINIC_OR_DEPARTMENT_OTHER): Payer: BLUE CROSS/BLUE SHIELD | Admitting: Internal Medicine

## 2015-06-25 ENCOUNTER — Ambulatory Visit: Payer: BLUE CROSS/BLUE SHIELD

## 2015-06-25 ENCOUNTER — Inpatient Hospital Stay: Payer: BLUE CROSS/BLUE SHIELD

## 2015-06-25 VITALS — BP 140/67 | HR 67 | Temp 97.1°F | Resp 18 | Wt 124.3 lb

## 2015-06-25 DIAGNOSIS — Z79899 Other long term (current) drug therapy: Secondary | ICD-10-CM

## 2015-06-25 DIAGNOSIS — C3481 Malignant neoplasm of overlapping sites of right bronchus and lung: Secondary | ICD-10-CM

## 2015-06-25 DIAGNOSIS — Z87891 Personal history of nicotine dependence: Secondary | ICD-10-CM

## 2015-06-25 DIAGNOSIS — Z9221 Personal history of antineoplastic chemotherapy: Secondary | ICD-10-CM | POA: Diagnosis not present

## 2015-06-25 DIAGNOSIS — C33 Malignant neoplasm of trachea: Secondary | ICD-10-CM | POA: Diagnosis not present

## 2015-06-25 DIAGNOSIS — C3480 Malignant neoplasm of overlapping sites of unspecified bronchus and lung: Principal | ICD-10-CM

## 2015-06-25 LAB — CBC WITH DIFFERENTIAL/PLATELET
BASOS PCT: 1 %
Basophils Absolute: 0 10*3/uL (ref 0–0.1)
EOS ABS: 0 10*3/uL (ref 0–0.7)
Eosinophils Relative: 1 %
HCT: 31.1 % — ABNORMAL LOW (ref 35.0–47.0)
HEMOGLOBIN: 10.9 g/dL — AB (ref 12.0–16.0)
Lymphocytes Relative: 13 %
Lymphs Abs: 0.7 10*3/uL — ABNORMAL LOW (ref 1.0–3.6)
MCH: 37.9 pg — ABNORMAL HIGH (ref 26.0–34.0)
MCHC: 34.9 g/dL (ref 32.0–36.0)
MCV: 108.4 fL — ABNORMAL HIGH (ref 80.0–100.0)
MONOS PCT: 9 %
Monocytes Absolute: 0.5 10*3/uL (ref 0.2–0.9)
NEUTROS PCT: 76 %
Neutro Abs: 4.2 10*3/uL (ref 1.4–6.5)
PLATELETS: 250 10*3/uL (ref 150–440)
RBC: 2.87 MIL/uL — ABNORMAL LOW (ref 3.80–5.20)
RDW: 16.1 % — ABNORMAL HIGH (ref 11.5–14.5)
WBC: 5.5 10*3/uL (ref 3.6–11.0)

## 2015-06-25 LAB — MAGNESIUM: Magnesium: 2 mg/dL (ref 1.7–2.4)

## 2015-06-25 LAB — COMPREHENSIVE METABOLIC PANEL
ALBUMIN: 4.3 g/dL (ref 3.5–5.0)
ALK PHOS: 76 U/L (ref 38–126)
ALT: 15 U/L (ref 14–54)
ANION GAP: 8 (ref 5–15)
AST: 26 U/L (ref 15–41)
BUN: 18 mg/dL (ref 6–20)
CALCIUM: 9.2 mg/dL (ref 8.9–10.3)
CHLORIDE: 106 mmol/L (ref 101–111)
CO2: 22 mmol/L (ref 22–32)
Creatinine, Ser: 0.64 mg/dL (ref 0.44–1.00)
GFR calc Af Amer: >60 mL/min (ref >60)
GFR calc non Af Amer: >60 mL/min (ref >60)
GLUCOSE: 97 mg/dL (ref 65–99)
POTASSIUM: 3.7 mmol/L (ref 3.5–5.1)
SODIUM: 136 mmol/L (ref 135–145)
Total Bilirubin: 0.8 mg/dL (ref 0.3–1.2)
Total Protein: 7.5 g/dL (ref 6.5–8.1)

## 2015-06-25 NOTE — Progress Notes (Signed)
[place] OFFICE PROGRESS NOTE  Patient Care Team: Mar Daring, PA-C as PCP - General (Family Medicine) Allyne Gee, MD as Referring Physician (Internal Medicine)  Malignant neoplasm of trachea, bronchus, and lung [facility])   Staging form: Lung, AJCC 7th Edition     Clinical: Stage IIIB (T4, N2, M0) - Signed by Cammie Sickle, MD on 06/24/2015    Oncology History   # FEB 2017-ADENO CA Right Lung T4N2M0- STAGE IIIB [Bronch & Subcarinal LNBx]- March 8th START CARBO-ALIMTA x4 cycles- June 8th 2017 - IMPROVED FDG MULTI-FOCAL lesions/N2-LN activity.     MOLECULAR STUDIES:  KRAS MUTATED/Tissues not sufficient for OTHER the molecular marker      Malignant neoplasm of trachea, bronchus, and lung ([place])   06/24/2015 Initial Diagnosis Malignant neoplasm of trachea, bronchus, and lung ([facility])     INTERVAL HISTORY:  Deanna Blair 60 y.o.  female pleasant patient above history of T4 N2 adenocarcinoma the lung currently on chemotherapy with carboplatin and Alimta 4 cycles is here for follow-up. Her last chemotherapy was approximately 5 weeks ago.  In the interim she was evaluated by radiation oncology.  Appetite is good. No weight loss. No nausea no vomiting. No chest pain or shortness of breath or cough. No hemoptysis. No headaches.  REVIEW OF SYSTEMS:  A complete 10 point review of system is done which is negative except mentioned above/history of present illness.   PAST MEDICAL HISTORY :  Past Medical History  Diagnosis Date  . Depression with anxiety     Well controlled with Wellbutrin and Buspar  . Pneumonia   . Anxiety   . Depression   . Cancer of right lung ([place]) 02/13/2015    PAST SURGICAL HISTORY :   Past Surgical History  Procedure Laterality Date  . Ectopic pregnancy surgery  1988  . Endobronchial ultrasound N/A 02/10/2015    Procedure: ENDOBRONCHIAL ULTRASOUND;  Surgeon: Flora Lipps, MD;  Location: [facility] ORS;  Service: Cardiopulmonary;   Laterality: N/A;  . Electromagnetic navigation brochoscopy N/A 02/10/2015    Procedure: ELECTROMAGNETIC NAVIGATION BRONCHOSCOPY;  Surgeon: Flora Lipps, MD;  Location: [facility] ORS;  Service: Cardiopulmonary;  Laterality: N/A;  . Peripheral vascular catheterization N/A 03/05/2015    Procedure: Glori Luis Cath Insertion;  Surgeon: Katha Cabal, MD;  Location: [place] [facility];  Service: Cardiovascular;  Laterality: N/A;    FAMILY HISTORY :   Family History  Problem Relation Age of Onset  . Cancer Father     Prostate Cancer  . Hypertension Father   . Stroke Father   . Hypertension Brother     SOCIAL HISTORY:   Social History  Substance Use Topics  . Smoking status: Former Smoker -- 0.25 packs/day for 30 years    Types: Cigarettes    Quit date: 02/06/2005  . Smokeless tobacco: Never Used  . Alcohol Use: 3.0 oz/week    5 Cans of beer per week     Comment: 5 beers weekly    ALLERGIES:  has No Known Allergies.  MEDICATIONS:  Current Outpatient Prescriptions  Medication Sig Dispense Refill  . busPIRone (BUSPAR) 5 MG tablet TAKE ONE TABLET BY MOUTH TWICE DAILY 60 tablet 6  . dexamethasone (DECADRON) 4 MG tablet Take 1 tablet by mouth at 8am and 5pm on day 2 and day 3 after chemotherapy. 30 tablet 0  . folic acid (FOLVITE) 1 MG tablet Take 1 tablet (1 mg total) by mouth daily. 30 tablet 3  . lidocaine-prilocaine (EMLA) cream  Apply 1 application topically as needed. 30 g 2  . ondansetron (ZOFRAN) 8 MG tablet Take 1 tablet (8 mg total) by mouth 2 (two) times daily as needed (Nausea or vomiting). Start if needed on the third day after chemotherapy. 30 tablet 1  . SPIRIVA RESPIMAT 1.25 MCG/ACT AERS     . SYMBICORT 80-4.5 MCG/ACT inhaler     . terconazole (TERAZOL 7) 0.4 % vaginal cream Place 1 applicator vaginally at bedtime. X 7 days 45 g 0  . zolpidem (AMBIEN) 5 MG tablet Take 1 tablet (5 mg total) by mouth at bedtime as needed for sleep. 30 tablet 0   No current facility-administered  medications for this visit.    PHYSICAL EXAMINATION: ECOG PERFORMANCE STATUS: 0 - Asymptomatic  BP 140/67 mmHg  Pulse 67  Temp(Src) 97.1 F (36.2 C) (Tympanic)  Resp 18  Wt 124 lb 5.4 oz (56.4 kg)  SpO2   LMP 06/16/2000 (Approximate)  Filed Weights   06/25/15 0852  Weight: 124 lb 5.4 oz (56.4 kg)    GENERAL: Well-nourished well-developed; Alert, no distress and comfortable.   Accompanied by her husband.  EYES: no pallor or icterus OROPHARYNX: no thrush or ulceration; good dentition  NECK: supple, no masses felt LYMPH:  no palpable lymphadenopathy in the cervical, axillary or inguinal regions LUNGS: clear to auscultation and  No wheeze or crackles HEART/CVS: regular rate & rhythm and no murmurs; No lower extremity edema ABDOMEN:abdomen soft, non-tender and normal bowel sounds Musculoskeletal:no cyanosis of digits and no clubbing  PSYCH: alert & oriented x 3 with fluent speech NEURO: no focal motor/sensory deficits SKIN:  no rashes or significant lesions  LABORATORY DATA:  I have reviewed the data as listed    Component Value Date/Time   NA 136 06/25/2015 1020   NA 139 01/03/2015 0910   K 3.7 06/25/2015 1020   CL 106 06/25/2015 1020   CO2 22 06/25/2015 1020   GLUCOSE 97 06/25/2015 1020   GLUCOSE 92 01/03/2015 0910   BUN 18 06/25/2015 1020   BUN 10 01/03/2015 0910   CREATININE 0.64 06/25/2015 1020   CALCIUM 9.2 06/25/2015 1020   PROT 7.5 06/25/2015 1020   PROT 6.5 01/03/2015 0910   ALBUMIN 4.3 06/25/2015 1020   ALBUMIN 3.8 01/03/2015 0910   AST 26 06/25/2015 1020   ALT 15 06/25/2015 1020   ALKPHOS 76 06/25/2015 1020   BILITOT 0.8 06/25/2015 1020   BILITOT <0.2 01/03/2015 0910   GFRNONAA >60 06/25/2015 1020   GFRAA >60 06/25/2015 1020    No results found for: SPEP, UPEP  Lab Results  Component Value Date   WBC 5.5 06/25/2015   NEUTROABS 4.2 06/25/2015   HGB 10.9* 06/25/2015   HCT 31.1* 06/25/2015   MCV 108.4* 06/25/2015   PLT 250 06/25/2015       Chemistry      Component Value Date/Time   NA 136 06/25/2015 1020   NA 139 01/03/2015 0910   K 3.7 06/25/2015 1020   CL 106 06/25/2015 1020   CO2 22 06/25/2015 1020   BUN 18 06/25/2015 1020   BUN 10 01/03/2015 0910   CREATININE 0.64 06/25/2015 1020      Component Value Date/Time   CALCIUM 9.2 06/25/2015 1020   ALKPHOS 76 06/25/2015 1020   AST 26 06/25/2015 1020   ALT 15 06/25/2015 1020   BILITOT 0.8 06/25/2015 1020   BILITOT <0.2 01/03/2015 0910       RADIOGRAPHIC STUDIES: I have personally reviewed the radiological images  as listed and agreed with the findings in the report. No results found.   ASSESSMENT & PLAN:  Malignant neoplasm of trachea, bronchus, and lung ([facility]) T4 N2 adenocarcinoma of the lung right side- status post 4 cycles of carboplatin and Alimta. Reviewed the PET scan SUV decreased; however size of the lung nodules not significantly changed.  Evaluated by radiation oncology-radiation not recommended. I spoke to Dr. Faith Rogue; who agrees to evaluate the patient for surgical option. However I have discussed the patient and husband that surgery is less likely Option.  The plan is to resume chemotherapy-carboplatin and Alimta 2 more cycles; starting next week and then maintenance chemotherapy with Alimta plus minus Avastin.    Orders Placed This Encounter  Procedures  . CBC with Differential    Standing Status: Future     Number of Occurrences:      Standing Expiration Date: 06/24/2016  . Comprehensive metabolic panel    Standing Status: Future     Number of Occurrences:      Standing Expiration Date: 06/24/2016    Order Specific Question:  Has the patient fasted?    Answer:  No   All questions were answered. The patient knows to call the clinic with any problems, questions or concerns.  If surgery is not an option- proceed with Cycle #5 carboplatin and Alimta next week; follow-up with me in 4 weeks prior to cycle 6.     Cammie Sickle, MD 06/25/2015  1:44 PM

## 2015-06-25 NOTE — Progress Notes (Signed)
Patient continues to have pain in her chest area above her port.

## 2015-06-25 NOTE — Assessment & Plan Note (Signed)
T4 N2 adenocarcinoma of the lung right side- status post 4 cycles of carboplatin and Alimta. Reviewed the PET scan SUV decreased; however size of the lung nodules not significantly changed.  Evaluated by radiation oncology-radiation not recommended. I spoke to Dr. Faith Rogue; who agrees to evaluate the patient for surgical option. However I have discussed the patient and husband that surgery is less likely Option.  The plan is to resume chemotherapy-carboplatin and Alimta 2 more cycles; starting next week and then maintenance chemotherapy with Alimta plus minus Avastin.

## 2015-06-26 ENCOUNTER — Other Ambulatory Visit: Payer: Self-pay | Admitting: *Deleted

## 2015-07-01 ENCOUNTER — Other Ambulatory Visit: Payer: Self-pay | Admitting: Internal Medicine

## 2015-07-01 ENCOUNTER — Other Ambulatory Visit: Payer: Self-pay | Admitting: *Deleted

## 2015-07-01 DIAGNOSIS — C33 Malignant neoplasm of trachea: Secondary | ICD-10-CM

## 2015-07-01 DIAGNOSIS — C3480 Malignant neoplasm of overlapping sites of unspecified bronchus and lung: Principal | ICD-10-CM

## 2015-07-01 NOTE — Progress Notes (Signed)
Referral initiated to Dr. Genevive Bi per v/o Dr. Rogue Bussing. Proceed with chemotherapy as planned. Dr. Rogue Bussing would like pt to have a consult with Dr. Genevive Bi to document Dr. Soyla Dryer surgical recommendations.

## 2015-07-02 ENCOUNTER — Telehealth: Payer: Self-pay | Admitting: Cardiothoracic Surgery

## 2015-07-02 ENCOUNTER — Inpatient Hospital Stay: Payer: BLUE CROSS/BLUE SHIELD

## 2015-07-02 ENCOUNTER — Other Ambulatory Visit: Payer: Self-pay

## 2015-07-02 VITALS — BP 145/84 | HR 57 | Temp 97.0°F | Resp 16

## 2015-07-02 DIAGNOSIS — C33 Malignant neoplasm of trachea: Secondary | ICD-10-CM

## 2015-07-02 DIAGNOSIS — C3480 Malignant neoplasm of overlapping sites of unspecified bronchus and lung: Principal | ICD-10-CM

## 2015-07-02 DIAGNOSIS — C3481 Malignant neoplasm of overlapping sites of right bronchus and lung: Secondary | ICD-10-CM

## 2015-07-02 MED ORDER — SODIUM CHLORIDE 0.9 % IV SOLN
Freq: Once | INTRAVENOUS | Status: AC
  Administered 2015-07-02: 11:00:00 via INTRAVENOUS
  Filled 2015-07-02: qty 5

## 2015-07-02 MED ORDER — PALONOSETRON HCL INJECTION 0.25 MG/5ML
0.2500 mg | Freq: Once | INTRAVENOUS | Status: AC
  Administered 2015-07-02: 0.25 mg via INTRAVENOUS
  Filled 2015-07-02: qty 5

## 2015-07-02 MED ORDER — SODIUM CHLORIDE 0.9 % IV SOLN
500.0000 mg/m2 | Freq: Once | INTRAVENOUS | Status: AC
  Administered 2015-07-02: 750 mg via INTRAVENOUS
  Filled 2015-07-02: qty 24

## 2015-07-02 MED ORDER — SODIUM CHLORIDE 0.9% FLUSH
10.0000 mL | INTRAVENOUS | Status: DC | PRN
  Administered 2015-07-02: 10 mL
  Filled 2015-07-02: qty 10

## 2015-07-02 MED ORDER — SODIUM CHLORIDE 0.9 % IV SOLN
Freq: Once | INTRAVENOUS | Status: AC
  Administered 2015-07-02: 11:00:00 via INTRAVENOUS
  Filled 2015-07-02: qty 1000

## 2015-07-02 MED ORDER — CYANOCOBALAMIN 1000 MCG/ML IJ SOLN: 1000.0000 ug | Freq: Once | INTRAMUSCULAR | Status: AC

## 2015-07-02 MED ORDER — CARBOPLATIN CHEMO INJECTION 600 MG/60ML
434.0000 mg | Freq: Once | INTRAVENOUS | Status: AC
  Administered 2015-07-02: 430 mg via INTRAVENOUS
  Filled 2015-07-02: qty 43

## 2015-07-02 MED ORDER — FOLIC ACID 1 MG PO TABS: 1.0000 mg | ORAL_TABLET | Freq: Every day | ORAL | Status: DC

## 2015-07-02 MED ORDER — HEPARIN SOD (PORK) LOCK FLUSH 100 UNIT/ML IV SOLN
500.0000 [IU] | Freq: Once | INTRAVENOUS | Status: AC | PRN
  Administered 2015-07-02: 500 [IU]
  Filled 2015-07-02: qty 5

## 2015-07-02 NOTE — Telephone Encounter (Signed)
Patient states she had received a phone call regarding scheduling an appointment with Dr Genevive Bi. Patient states she called Dr Rogue Bussing and he states patient will not be having surgery at this time. She is going to finish chemo first. So patient does not want to make an appointment with Dr Genevive Bi at this time.

## 2015-07-03 ENCOUNTER — Ambulatory Visit: Payer: BLUE CROSS/BLUE SHIELD

## 2015-07-17 ENCOUNTER — Ambulatory Visit: Payer: BLUE CROSS/BLUE SHIELD

## 2015-07-23 ENCOUNTER — Encounter: Payer: Self-pay | Admitting: Internal Medicine

## 2015-07-23 ENCOUNTER — Inpatient Hospital Stay: Payer: BLUE CROSS/BLUE SHIELD

## 2015-07-23 ENCOUNTER — Inpatient Hospital Stay: Payer: BLUE CROSS/BLUE SHIELD | Attending: Internal Medicine | Admitting: Internal Medicine

## 2015-07-23 VITALS — BP 145/96 | HR 62 | Temp 97.1°F | Resp 18 | Wt 123.5 lb

## 2015-07-23 DIAGNOSIS — Z8041 Family history of malignant neoplasm of ovary: Secondary | ICD-10-CM | POA: Diagnosis not present

## 2015-07-23 DIAGNOSIS — C3431 Malignant neoplasm of lower lobe, right bronchus or lung: Secondary | ICD-10-CM | POA: Insufficient documentation

## 2015-07-23 DIAGNOSIS — Z8701 Personal history of pneumonia (recurrent): Secondary | ICD-10-CM | POA: Diagnosis not present

## 2015-07-23 DIAGNOSIS — Z79899 Other long term (current) drug therapy: Secondary | ICD-10-CM | POA: Diagnosis not present

## 2015-07-23 DIAGNOSIS — Z87891 Personal history of nicotine dependence: Secondary | ICD-10-CM

## 2015-07-23 DIAGNOSIS — C3480 Malignant neoplasm of overlapping sites of unspecified bronchus and lung: Principal | ICD-10-CM

## 2015-07-23 DIAGNOSIS — Z5111 Encounter for antineoplastic chemotherapy: Secondary | ICD-10-CM | POA: Diagnosis not present

## 2015-07-23 DIAGNOSIS — C33 Malignant neoplasm of trachea: Secondary | ICD-10-CM

## 2015-07-23 DIAGNOSIS — F418 Other specified anxiety disorders: Secondary | ICD-10-CM | POA: Insufficient documentation

## 2015-07-23 LAB — CBC WITH DIFFERENTIAL/PLATELET
BASOS ABS: 0 10*3/uL (ref 0–0.1)
Basophils Relative: 0 %
EOS PCT: 1 %
Eosinophils Absolute: 0 10*3/uL (ref 0–0.7)
HEMATOCRIT: 30.2 % — AB (ref 35.0–47.0)
HEMOGLOBIN: 10.7 g/dL — AB (ref 12.0–16.0)
LYMPHS ABS: 0.7 10*3/uL — AB (ref 1.0–3.6)
LYMPHS PCT: 22 %
MCH: 37.7 pg — AB (ref 26.0–34.0)
MCHC: 35.5 g/dL (ref 32.0–36.0)
MCV: 106.3 fL — AB (ref 80.0–100.0)
Monocytes Absolute: 0.4 10*3/uL (ref 0.2–0.9)
Monocytes Relative: 13 %
NEUTROS ABS: 2.1 10*3/uL (ref 1.4–6.5)
Neutrophils Relative %: 62 %
Platelets: 266 10*3/uL (ref 150–440)
RBC: 2.84 MIL/uL — AB (ref 3.80–5.20)
RDW: 13.7 % (ref 11.5–14.5)
WBC: 3.3 10*3/uL — AB (ref 3.6–11.0)

## 2015-07-23 LAB — COMPREHENSIVE METABOLIC PANEL
ALK PHOS: 76 U/L (ref 38–126)
ALT: 17 U/L (ref 14–54)
AST: 32 U/L (ref 15–41)
Albumin: 4.3 g/dL (ref 3.5–5.0)
Anion gap: 11 (ref 5–15)
BILIRUBIN TOTAL: 0.4 mg/dL (ref 0.3–1.2)
BUN: 15 mg/dL (ref 6–20)
CALCIUM: 9.1 mg/dL (ref 8.9–10.3)
CHLORIDE: 103 mmol/L (ref 101–111)
CO2: 23 mmol/L (ref 22–32)
CREATININE: 0.63 mg/dL (ref 0.44–1.00)
Glucose, Bld: 103 mg/dL — ABNORMAL HIGH (ref 65–99)
Potassium: 3.6 mmol/L (ref 3.5–5.1)
Sodium: 137 mmol/L (ref 135–145)
TOTAL PROTEIN: 7.4 g/dL (ref 6.5–8.1)

## 2015-07-23 MED ORDER — CARBOPLATIN CHEMO INJECTION 600 MG/60ML
434.0000 mg | Freq: Once | INTRAVENOUS | Status: AC
  Administered 2015-07-23: 430 mg via INTRAVENOUS
  Filled 2015-07-23: qty 43

## 2015-07-23 MED ORDER — PEMETREXED DISODIUM CHEMO INJECTION 500 MG
500.0000 mg/m2 | Freq: Once | INTRAVENOUS | Status: AC
  Administered 2015-07-23: 750 mg via INTRAVENOUS
  Filled 2015-07-23: qty 6

## 2015-07-23 MED ORDER — CYANOCOBALAMIN 1000 MCG/ML IJ SOLN: 1000.0000 ug | Freq: Once | INTRAMUSCULAR | Status: AC

## 2015-07-23 MED ORDER — SODIUM CHLORIDE 0.9 % IV SOLN
Freq: Once | INTRAVENOUS | Status: AC
  Administered 2015-07-23: 12:00:00 via INTRAVENOUS
  Filled 2015-07-23: qty 5

## 2015-07-23 MED ORDER — SODIUM CHLORIDE 0.9% FLUSH
10.0000 mL | Freq: Once | INTRAVENOUS | Status: AC
  Administered 2015-07-23: 10 mL via INTRAVENOUS
  Filled 2015-07-23: qty 10

## 2015-07-23 MED ORDER — HEPARIN SOD (PORK) LOCK FLUSH 100 UNIT/ML IV SOLN
500.0000 [IU] | Freq: Once | INTRAVENOUS | Status: AC
  Administered 2015-07-23: 500 [IU] via INTRAVENOUS
  Filled 2015-07-23: qty 5

## 2015-07-23 MED ORDER — CYANOCOBALAMIN 1000 MCG/ML IJ SOLN
1000.0000 ug | Freq: Once | INTRAMUSCULAR | Status: AC
  Administered 2015-07-23: 1000 ug via INTRAMUSCULAR
  Filled 2015-07-23: qty 1

## 2015-07-23 MED ORDER — PALONOSETRON HCL INJECTION 0.25 MG/5ML
0.2500 mg | Freq: Once | INTRAVENOUS | Status: AC
  Administered 2015-07-23: 0.25 mg via INTRAVENOUS
  Filled 2015-07-23: qty 5

## 2015-07-23 MED ORDER — SODIUM CHLORIDE 0.9 % IV SOLN
Freq: Once | INTRAVENOUS | Status: AC
  Administered 2015-07-23: 12:00:00 via INTRAVENOUS
  Filled 2015-07-23: qty 1000

## 2015-07-23 NOTE — Assessment & Plan Note (Addendum)
T4 N2 adenocarcinoma of the lung right side- status post 4 cycles of carboplatin and Alimta. Reviewed the PET scan SUV decreased; however size of the lung nodules not significantly changed. Currently s/p 5 cycles. Not a candidate for surgery or radiation.  # plan # 6 cis-alimta today. Labs- okay.   # Moving forward will plan Alimta-Avastin q 3 weeks maintenance starting in  Aug 18th.

## 2015-07-23 NOTE — Progress Notes (Signed)
[place] OFFICE PROGRESS NOTE  Patient Care Team: Mar Daring, PA-C as PCP - General (Family Medicine) Allyne Gee, MD as Referring Physician (Internal Medicine)  Malignant neoplasm of trachea, bronchus, and lung [facility])   Staging form: Lung, AJCC 7th Edition     Clinical: Stage IIIB (T4, N2, M0) - Signed by Cammie Sickle, MD on 06/24/2015    Oncology History   # FEB 2017-[place] Right Lower Lung T4N2M0- STAGE IIIB [Bronch & Subcarinal LNBx]- March 8th START CARBO-ALIMTA x4 cycles- June 8th 2017 - IMPROVED FDG MULTI-FOCAL lesions/N2-LN activity.    MOLECULAR STUDIES:  KRAS MUTATED/Tissues not sufficient for OTHER the molecular marker      Malignant neoplasm of trachea, bronchus, and lung ([place] [facility])   06/24/2015 Initial Diagnosis Malignant neoplasm of trachea, bronchus, and lung (HCC)    Malignant neoplasm of lower lobe, right bronchus or lung ([place])   07/23/2015 Initial Diagnosis Malignant neoplasm of lower lobe, right bronchus or lung (HCC)     INTERVAL HISTORY:  Deanna Blair 60 y.o.  female pleasant patient above history of T4 N2 adenocarcinoma the lung currently on chemotherapy with carboplatin and Alimta 4 cycles is here for follow-up. Her last chemotherapy was approximately 5 weeks ago.  In the interim she was evaluated by radiation oncology.  Appetite is good. No weight loss. No nausea no vomiting. No chest pain or shortness of breath or cough. No hemoptysis. No headaches.  REVIEW OF SYSTEMS:  A complete 10 point review of system is done which is negative except mentioned above/history of present illness.   PAST MEDICAL HISTORY :  Past Medical History  Diagnosis Date  . Depression with anxiety     Well controlled with Wellbutrin and Buspar  . Pneumonia   . Anxiety   . Depression   . Cancer of right lung ([place]) 02/13/2015    PAST SURGICAL HISTORY :   Past Surgical History  Procedure Laterality Date  . Ectopic pregnancy surgery   1988  . Endobronchial ultrasound N/A 02/10/2015    Procedure: ENDOBRONCHIAL ULTRASOUND;  Surgeon: Flora Lipps, MD;  Location: [facility];  Service: Cardiopulmonary;  Laterality: N/A;  . Electromagnetic navigation brochoscopy N/A 02/10/2015    Procedure: ELECTROMAGNETIC NAVIGATION BRONCHOSCOPY;  Surgeon: Flora Lipps, MD;  Location: [facility] OR[facility];  Service: Cardiopulmonary;  Laterality: N/A;  . Peripheral vascular catheterization N/A 03/05/2015    Procedure: Glori Luis Cath Insertion;  Surgeon: Katha Cabal, MD;  Location: [facility];  Service: Cardiovascular;  Laterality: N/A;    FAMILY HISTORY :   Family History  Problem Relation Age of Onset  . Cancer Father     Prostate Cancer  . Hypertension Father   . Stroke Father   . Hypertension Brother     SOCIAL HISTORY:   Social History  Substance Use Topics  . Smoking status: Former Smoker -- 0.25 packs/day for 30 years    Types: Cigarettes    Quit date: 02/06/2005  . Smokeless tobacco: Never Used  . Alcohol Use: 3.0 oz/week    5 Cans of beer per week     Comment: 5 beers weekly    ALLERGIES:  has No Known Allergies.  MEDICATIONS:  Current Outpatient Prescriptions  Medication Sig Dispense Refill  . busPIRone (BUSPAR) 5 MG tablet TAKE ONE TABLET BY MOUTH TWICE DAILY 60 tablet 6  . dexamethasone (DECADRON) 4 MG tablet Take 1 tablet by mouth at 8am and 5pm on day 2 and day 3 after chemotherapy.  30 tablet 0  . folic acid (FOLVITE) 1 MG tablet Take 1 tablet (1 mg total) by mouth daily. 30 tablet 3  . lidocaine-prilocaine (EMLA) cream Apply 1 application topically as needed. 30 g 2  . ondansetron (ZOFRAN) 8 MG tablet Take 1 tablet (8 mg total) by mouth 2 (two) times daily as needed (Nausea or vomiting). Start if needed on the third day after chemotherapy. 30 tablet 1  . SPIRIVA RESPIMAT 1.25 MCG/ACT AERS     . SYMBICORT 80-4.5 MCG/ACT inhaler     . terconazole (TERAZOL 7) 0.4 % vaginal cream Place 1 applicator vaginally at bedtime. X 7  days 45 g 0  . zolpidem (AMBIEN) 5 MG tablet Take 1 tablet (5 mg total) by mouth at bedtime as needed for sleep. 30 tablet 0  . cyanocobalamin (,VITAMIN B-12,) 1000 MCG/ML injection Inject 1 mL (1,000 mcg total) into the muscle once. 1 mL 0   No current facility-administered medications for this visit.    PHYSICAL EXAMINATION: ECOG PERFORMANCE STATUS: 0 - Asymptomatic  BP 145/96 mmHg  Pulse 62  Temp(Src) 97.1 F (36.2 C) (Tympanic)  Resp 18  Wt 123 lb 8 oz (56.019 kg)  LMP 06/16/2000 (Approximate)  Filed Weights   07/23/15 1058  Weight: 123 lb 8 oz (56.019 kg)    GENERAL: Well-nourished well-developed; Alert, no distress and comfortable.   Accompanied by her husband.  EYES: no pallor or icterus OROPHARYNX: no thrush or ulceration; good dentition  NECK: supple, no masses felt LYMPH:  no palpable lymphadenopathy in the cervical, axillary or inguinal regions LUNGS: clear to auscultation and  No wheeze or crackles HEART/CVS: regular rate & rhythm and no murmurs; No lower extremity edema ABDOMEN:abdomen soft, non-tender and normal bowel sounds Musculoskeletal:no cyanosis of digits and no clubbing  PSYCH: alert & oriented x 3 with fluent speech NEURO: no focal motor/sensory deficits SKIN:  no rashes or significant lesions  LABORATORY DATA:  I have reviewed the data as listed    Component Value Date/Time   NA 137 07/23/2015 1008   NA 139 01/03/2015 0910   K 3.6 07/23/2015 1008   CL 103 07/23/2015 1008   CO2 23 07/23/2015 1008   GLUCOSE 103* 07/23/2015 1008   GLUCOSE 92 01/03/2015 0910   BUN 15 07/23/2015 1008   BUN 10 01/03/2015 0910   CREATININE 0.63 07/23/2015 1008   CALCIUM 9.1 07/23/2015 1008   PROT 7.4 07/23/2015 1008   PROT 6.5 01/03/2015 0910   ALBUMIN 4.3 07/23/2015 1008   ALBUMIN 3.8 01/03/2015 0910   AST 32 07/23/2015 1008   ALT 17 07/23/2015 1008   ALKPHOS 76 07/23/2015 1008   BILITOT 0.4 07/23/2015 1008   BILITOT <0.2 01/03/2015 0910   GFRNONAA >60  07/23/2015 1008   GFRAA >60 07/23/2015 1008    No results found for: SPEP, UPEP  Lab Results  Component Value Date   WBC 3.3* 07/23/2015   NEUTROABS 2.1 07/23/2015   HGB 10.7* 07/23/2015   HCT 30.2* 07/23/2015   MCV 106.3* 07/23/2015   PLT 266 07/23/2015      Chemistry      Component Value Date/Time   NA 137 07/23/2015 1008   NA 139 01/03/2015 0910   K 3.6 07/23/2015 1008   CL 103 07/23/2015 1008   CO2 23 07/23/2015 1008   BUN 15 07/23/2015 1008   BUN 10 01/03/2015 0910   CREATININE 0.63 07/23/2015 1008      Component Value Date/Time   CALCIUM 9.1 07/23/2015  1008   ALKPHOS 76 07/23/2015 1008   AST 32 07/23/2015 1008   ALT 17 07/23/2015 1008   BILITOT 0.4 07/23/2015 1008   BILITOT <0.2 01/03/2015 0910       RADIOGRAPHIC STUDIES: I have personally reviewed the radiological images as listed and agreed with the findings in the report. No results found.   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 Reviewed the PET scan SUV decreased; however size of the lung nodules not significantly changed. Currently s/p 5 cycles. Not a candidate for surgery or radiation.  # plan # 6 cis-alimta today. Labs- okay.   # Moving forward will plan Alimta-Avastin q 3 weeks maintenance starting in  Aug 18th.     Orders Placed This Encounter  Procedures  . CBC with Differential    Standing Status: Future     Number of Occurrences:      Standing Expiration Date: 07/22/2016  . Comprehensive metabolic panel    Standing Status: Future     Number of Occurrences:      Standing Expiration Date: 07/22/2016    Order Specific Question:  Has the patient fasted?    Answer:  No  . Urinalysis complete, with microscopic [facility])    Standing Status: Future     Number of Occurrences:      Standing Expiration Date: 07/22/2016      Cammie Sickle, MD 07/23/2015  10:11 PM

## 2015-07-24 ENCOUNTER — Other Ambulatory Visit: Payer: BLUE CROSS/BLUE SHIELD

## 2015-07-24 ENCOUNTER — Ambulatory Visit: Payer: BLUE CROSS/BLUE SHIELD | Admitting: Internal Medicine

## 2015-07-24 ENCOUNTER — Ambulatory Visit: Payer: BLUE CROSS/BLUE SHIELD

## 2015-07-31 ENCOUNTER — Other Ambulatory Visit: Payer: Self-pay | Admitting: Internal Medicine

## 2015-08-20 NOTE — Telephone Encounter (Signed)
error 

## 2015-08-22 ENCOUNTER — Inpatient Hospital Stay (HOSPITAL_BASED_OUTPATIENT_CLINIC_OR_DEPARTMENT_OTHER): Payer: BLUE CROSS/BLUE SHIELD | Admitting: Internal Medicine

## 2015-08-22 ENCOUNTER — Inpatient Hospital Stay: Payer: BLUE CROSS/BLUE SHIELD | Attending: Internal Medicine

## 2015-08-22 ENCOUNTER — Inpatient Hospital Stay: Payer: BLUE CROSS/BLUE SHIELD

## 2015-08-22 ENCOUNTER — Other Ambulatory Visit: Payer: Self-pay

## 2015-08-22 VITALS — BP 158/85 | HR 60 | Temp 97.7°F | Resp 18 | Wt 126.1 lb

## 2015-08-22 DIAGNOSIS — Z87891 Personal history of nicotine dependence: Secondary | ICD-10-CM

## 2015-08-22 DIAGNOSIS — F329 Major depressive disorder, single episode, unspecified: Secondary | ICD-10-CM

## 2015-08-22 DIAGNOSIS — Z79899 Other long term (current) drug therapy: Secondary | ICD-10-CM

## 2015-08-22 DIAGNOSIS — F419 Anxiety disorder, unspecified: Secondary | ICD-10-CM | POA: Insufficient documentation

## 2015-08-22 DIAGNOSIS — C3431 Malignant neoplasm of lower lobe, right bronchus or lung: Secondary | ICD-10-CM

## 2015-08-22 DIAGNOSIS — Z5111 Encounter for antineoplastic chemotherapy: Secondary | ICD-10-CM | POA: Diagnosis not present

## 2015-08-22 DIAGNOSIS — C3480 Malignant neoplasm of overlapping sites of unspecified bronchus and lung: Principal | ICD-10-CM

## 2015-08-22 DIAGNOSIS — Z8701 Personal history of pneumonia (recurrent): Secondary | ICD-10-CM | POA: Insufficient documentation

## 2015-08-22 DIAGNOSIS — Z8042 Family history of malignant neoplasm of prostate: Secondary | ICD-10-CM | POA: Insufficient documentation

## 2015-08-22 DIAGNOSIS — C33 Malignant neoplasm of trachea: Secondary | ICD-10-CM

## 2015-08-22 LAB — CBC WITH DIFFERENTIAL/PLATELET
BASOS ABS: 0 10*3/uL (ref 0–0.1)
BASOS PCT: 1 %
Eosinophils Absolute: 0.1 10*3/uL (ref 0–0.7)
Eosinophils Relative: 1 %
HEMATOCRIT: 30.4 % — AB (ref 35.0–47.0)
Hemoglobin: 10.7 g/dL — ABNORMAL LOW (ref 12.0–16.0)
Lymphocytes Relative: 15 %
Lymphs Abs: 0.8 10*3/uL — ABNORMAL LOW (ref 1.0–3.6)
MCH: 37.3 pg — ABNORMAL HIGH (ref 26.0–34.0)
MCHC: 35.2 g/dL (ref 32.0–36.0)
MCV: 106 fL — ABNORMAL HIGH (ref 80.0–100.0)
MONO ABS: 0.6 10*3/uL (ref 0.2–0.9)
Monocytes Relative: 11 %
NEUTROS ABS: 4 10*3/uL (ref 1.4–6.5)
Neutrophils Relative %: 72 %
PLATELETS: 277 10*3/uL (ref 150–440)
RBC: 2.86 MIL/uL — ABNORMAL LOW (ref 3.80–5.20)
RDW: 14.7 % — AB (ref 11.5–14.5)
WBC: 5.5 10*3/uL (ref 3.6–11.0)

## 2015-08-22 LAB — URINALYSIS COMPLETE WITH MICROSCOPIC (ARMC ONLY)
Bacteria, UA: NONE SEEN
Bilirubin Urine: NEGATIVE
Glucose, UA: NEGATIVE mg/dL
Hgb urine dipstick: NEGATIVE
Ketones, ur: NEGATIVE mg/dL
Leukocytes, UA: NEGATIVE
Nitrite: NEGATIVE
PH: 5 (ref 5.0–8.0)
PROTEIN: NEGATIVE mg/dL
Specific Gravity, Urine: 1.014 (ref 1.005–1.030)

## 2015-08-22 LAB — COMPREHENSIVE METABOLIC PANEL
ALBUMIN: 4.2 g/dL (ref 3.5–5.0)
ALT: 23 U/L (ref 14–54)
AST: 43 U/L — AB (ref 15–41)
Alkaline Phosphatase: 69 U/L (ref 38–126)
Anion gap: 6 (ref 5–15)
BILIRUBIN TOTAL: 0.6 mg/dL (ref 0.3–1.2)
BUN: 14 mg/dL (ref 6–20)
CHLORIDE: 108 mmol/L (ref 101–111)
CO2: 22 mmol/L (ref 22–32)
Calcium: 8.6 mg/dL — ABNORMAL LOW (ref 8.9–10.3)
Creatinine, Ser: 0.65 mg/dL (ref 0.44–1.00)
GFR calc Af Amer: >60 mL/min (ref >60)
GFR calc non Af Amer: >60 mL/min (ref >60)
GLUCOSE: 99 mg/dL (ref 65–99)
POTASSIUM: 4.1 mmol/L (ref 3.5–5.1)
SODIUM: 136 mmol/L (ref 135–145)
Total Protein: 7.2 g/dL (ref 6.5–8.1)

## 2015-08-22 MED ORDER — SODIUM CHLORIDE 0.9% FLUSH
10.0000 mL | INTRAVENOUS | Status: DC | PRN
  Administered 2015-08-22: 10 mL
  Filled 2015-08-22: qty 10

## 2015-08-22 MED ORDER — HEPARIN SOD (PORK) LOCK FLUSH 100 UNIT/ML IV SOLN
500.0000 [IU] | Freq: Once | INTRAVENOUS | Status: AC | PRN
  Administered 2015-08-22: 500 [IU]
  Filled 2015-08-22: qty 5

## 2015-08-22 MED ORDER — SODIUM CHLORIDE 0.9 % IV SOLN
15.0000 mg/kg | Freq: Once | INTRAVENOUS | Status: AC
  Administered 2015-08-22: 850 mg via INTRAVENOUS
  Filled 2015-08-22: qty 32

## 2015-08-22 MED ORDER — SODIUM CHLORIDE 0.9 % IV SOLN
Freq: Once | INTRAVENOUS | Status: AC
  Administered 2015-08-22: 11:00:00 via INTRAVENOUS
  Filled 2015-08-22: qty 1000

## 2015-08-22 MED ORDER — PROCHLORPERAZINE MALEATE 10 MG PO TABS
10.0000 mg | ORAL_TABLET | Freq: Once | ORAL | Status: AC
  Administered 2015-08-22: 10 mg via ORAL
  Filled 2015-08-22: qty 1

## 2015-08-22 MED ORDER — SODIUM CHLORIDE 0.9 % IV SOLN
500.0000 mg/m2 | Freq: Once | INTRAVENOUS | Status: AC
  Administered 2015-08-22: 775 mg via INTRAVENOUS
  Filled 2015-08-22: qty 20

## 2015-08-22 MED ORDER — CYANOCOBALAMIN 1000 MCG/ML IJ SOLN
1000.0000 ug | Freq: Once | INTRAMUSCULAR | Status: AC
  Administered 2015-08-22: 1000 ug via INTRAMUSCULAR
  Filled 2015-08-22: qty 1

## 2015-08-22 NOTE — Progress Notes (Signed)
[place] OFFICE PROGRESS NOTE  Patient Care Team: Mar Daring, PA-C as PCP - General (Family Medicine) Allyne Gee, MD as Referring Physician (Internal Medicine)  Malignant neoplasm of trachea, bronchus, and lung [facility])   Staging form: Lung, AJCC 7th Edition     Clinical: Stage IIIB (T4, N2, M0) - Signed by Cammie Sickle, MD on 06/24/2015    Oncology History   # FEB 2017-[place] Right Lower Lung T4N2M0- STAGE IIIB [Bronch & Subcarinal LNBx]- March 8th START CARBO-ALIMTA x4 cycles- June 8th 2017 - IMPROVED FDG MULTI-FOCAL lesions/N2-LN activity. S/p [place]-- Alimta x6  # AUG 18th- Alimta- Avastin maintenance  MOLECULAR STUDIES:  KRAS MUTATED/Tissues not sufficient for OTHER the molecular marker      Malignant neoplasm of trachea, bronchus, and lung ([place])   06/24/2015 Initial Diagnosis    Malignant neoplasm of trachea, bronchus, and lung (HCC)       Malignant neoplasm of lower lobe, right bronchus or lung ([place])   07/23/2015 Initial Diagnosis    Malignant neoplasm of lower lobe, right bronchus or lung (HCC)        INTERVAL HISTORY:  Deanna Blair 60 y.o.  female pleasant patient above history of T4 N2 adenocarcinoma the lung currently on chemotherapy with carboplatin and Alimta 6 cycles.   Patient is here to start maintenance therapy with Alimta and Avastin. Appetite is good. No weight loss. No nausea no vomiting. No chest pain or shortness of breath or cough. No hemoptysis. No headaches.  Patient has been on a recent hiking trip. Denies any swelling in the legs.   REVIEW OF SYSTEMS:  A complete 10 point review of system is done which is negative except mentioned above/history of present illness.   PAST MEDICAL HISTORY :  Past Medical History:  Diagnosis Date  . Anxiety   . Cancer of right lung ([place]) 02/13/2015  . Depression   . Depression with anxiety    Well controlled with Wellbutrin and Buspar  . Pneumonia     PAST  SURGICAL HISTORY :   Past Surgical History:  Procedure Laterality Date  . ECTOPIC PREGNANCY SURGERY  1988  . ELECTROMAGNETIC NAVIGATION BROCHOSCOPY N/A 02/10/2015   Procedure: ELECTROMAGNETIC NAVIGATION BRONCHOSCOPY;  Surgeon: Flora Lipps, MD;  Location: [facility];  Service: Cardiopulmonary;  Laterality: N/A;  . ENDOBRONCHIAL ULTRASOUND N/A 02/10/2015   Procedure: ENDOBRONCHIAL ULTRASOUND;  Surgeon: Flora Lipps, MD;  Location: [facility];  Service: Cardiopulmonary;  Laterality: N/A;  . PERIPHERAL VASCULAR CATHETERIZATION N/A 03/05/2015   Procedure: Glori Luis Cath Insertion;  Surgeon: Katha Cabal, MD;  Location: [place] [facility];  Service: Cardiovascular;  Laterality: N/A;    FAMILY HISTORY :   Family History  Problem Relation Age of Onset  . Cancer Father     Prostate Cancer  . Hypertension Father   . Stroke Father   . Hypertension Brother     SOCIAL HISTORY:   Social History  Substance Use Topics  . Smoking status: Former Smoker    Packs/day: 0.25    Years: 30.00    Types: Cigarettes    Quit date: 02/06/2005  . Smokeless tobacco: Never Used  . Alcohol use 3.0 oz/week    5 Cans of beer per week     Comment: 5 beers weekly    ALLERGIES:  has No Known Allergies.  MEDICATIONS:  Current Outpatient Prescriptions  Medication Sig Dispense Refill  . busPIRone (BUSPAR) 5 MG tablet TAKE ONE TABLET BY MOUTH TWICE DAILY  60 tablet 6  . dexamethasone (DECADRON) 4 MG tablet Take 1 tablet by mouth at 8am and 5pm on day 2 and day 3 after chemotherapy. 30 tablet 0  . folic acid (FOLVITE) 1 MG tablet Take 1 tablet (1 mg total) by mouth daily. 30 tablet 3  . lidocaine-prilocaine (EMLA) cream Apply 1 application topically as needed. 30 g 2  . SPIRIVA RESPIMAT 1.25 MCG/ACT AERS     . SYMBICORT 80-4.5 MCG/ACT inhaler     . terconazole (TERAZOL 7) 0.4 % vaginal cream Place 1 applicator vaginally at bedtime. X 7 days 45 g 0  . zolpidem (AMBIEN) 5 MG tablet Take 1 tablet (5 mg total) by mouth at  bedtime as needed for sleep. 30 tablet 0   No current facility-administered medications for this visit.    Facility-Administered Medications Ordered in Other Visits  Medication Dose Route Frequency Provider Last Rate Last Dose  . sodium chloride flush (NS) 0.9 % injection 10 mL  10 mL Intracatheter PRN Cammie Sickle, MD   10 mL at 08/22/15 0826    PHYSICAL EXAMINATION: ECOG PERFORMANCE STATUS: 0 - Asymptomatic  BP (!) 158/85 (BP Location: Left Arm, Patient Position: Sitting)   Pulse 60   Temp 97.7 F (36.5 C) (Tympanic)   Resp 18   Wt 126 lb 2 oz (57.2 kg)   LMP 06/16/2000 (Approximate) Comment: 15 yrs ago  BMI 23.07 kg/m   Filed Weights   08/22/15 0921  Weight: 126 lb 2 oz (57.2 kg)    GENERAL: Well-nourished well-developed; Alert, no distress and comfortable.   She is alone. EYES: no pallor or icterus OROPHARYNX: no thrush or ulceration; good dentition  NECK: supple, no masses felt LYMPH:  no palpable lymphadenopathy in the cervical, axillary or inguinal regions LUNGS: clear to auscultation and  No wheeze or crackles HEART/CVS: regular rate & rhythm and no murmurs; No lower extremity edema ABDOMEN:abdomen soft, non-tender and normal bowel sounds Musculoskeletal:no cyanosis of digits and no clubbing  PSYCH: alert & oriented x 3 with fluent speech NEURO: no focal motor/sensory deficits SKIN:  no rashes or significant lesions  LABORATORY DATA:  I have reviewed the data as listed    Component Value Date/Time   NA 136 08/22/2015 0827   NA 139 01/03/2015 0910   K 4.1 08/22/2015 0827   CL 108 08/22/2015 0827   CO2 22 08/22/2015 0827   GLUCOSE 99 08/22/2015 0827   BUN 14 08/22/2015 0827   BUN 10 01/03/2015 0910   CREATININE 0.65 08/22/2015 0827   CALCIUM 8.6 (L) 08/22/2015 0827   PROT 7.2 08/22/2015 0827   PROT 6.5 01/03/2015 0910   ALBUMIN 4.2 08/22/2015 0827   ALBUMIN 3.8 01/03/2015 0910   AST 43 (H) 08/22/2015 0827   ALT 23 08/22/2015 0827   ALKPHOS 69  08/22/2015 0827   BILITOT 0.6 08/22/2015 0827   BILITOT <0.2 01/03/2015 0910   GFRNONAA >60 08/22/2015 0827   GFRAA >60 08/22/2015 0827    No results found for: SPEP, UPEP  Lab Results  Component Value Date   WBC 5.5 08/22/2015   NEUTROABS 4.0 08/22/2015   HGB 10.7 (L) 08/22/2015   HCT 30.4 (L) 08/22/2015   MCV 106.0 (H) 08/22/2015   PLT 277 08/22/2015      Chemistry      Component Value Date/Time   NA 136 08/22/2015 0827   NA 139 01/03/2015 0910   K 4.1 08/22/2015 0827   CL 108 08/22/2015 0827   CO2  22 08/22/2015 0827   BUN 14 08/22/2015 0827   BUN 10 01/03/2015 0910   CREATININE 0.65 08/22/2015 0827      Component Value Date/Time   CALCIUM 8.6 (L) 08/22/2015 0827   ALKPHOS 69 08/22/2015 0827   AST 43 (H) 08/22/2015 0827   ALT 23 08/22/2015 0827   BILITOT 0.6 08/22/2015 0827   BILITOT <0.2 01/03/2015 0910       RADIOGRAPHIC STUDIES: I have personally reviewed the radiological images as listed and agreed with the findings in the report. No results found.   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   # proceed with Alimta-Avastin maintence #1 today. Labs okay- except for hb 10 today.   # Continue maintenance therapy  q 3 weeks/ wants on weds/  # long discussion regarding the incurable nature of the disease/ will need lifelong maintenance therapies/ discussed median survival is 1-2 years. However given her good performance status multiple treatment options available- I think like expectancy is in years. Will also need Tissue for mutations. Pt awaiting CTCA referal thru employer.   No orders of the defined types were placed in this encounter.    Cammie Sickle, MD 08/22/2015 1:42 PM

## 2015-08-22 NOTE — Assessment & Plan Note (Addendum)
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   # proceed with Alimta-Avastin maintence #1 today. Labs okay- except for hb 10 today.   # Continue maintenance therapy  q 3 weeks/ wants on weds/  # long discussion regarding the incurable nature of the disease/ will need lifelong maintenance therapies/ discussed median survival is 1-2 years. However given her good performance status multiple treatment options available- I think like expectancy is in years. Will also need Tissue for mutations. Pt awaiting CTCA referal thru employer.

## 2015-09-17 ENCOUNTER — Inpatient Hospital Stay (HOSPITAL_BASED_OUTPATIENT_CLINIC_OR_DEPARTMENT_OTHER): Payer: BLUE CROSS/BLUE SHIELD | Admitting: Internal Medicine

## 2015-09-17 ENCOUNTER — Inpatient Hospital Stay: Payer: BLUE CROSS/BLUE SHIELD

## 2015-09-17 ENCOUNTER — Encounter: Payer: Self-pay | Admitting: Internal Medicine

## 2015-09-17 ENCOUNTER — Inpatient Hospital Stay: Payer: BLUE CROSS/BLUE SHIELD | Attending: Internal Medicine

## 2015-09-17 VITALS — BP 154/95 | HR 58 | Temp 97.1°F | Resp 18 | Ht 62.0 in | Wt 125.6 lb

## 2015-09-17 DIAGNOSIS — Z8701 Personal history of pneumonia (recurrent): Secondary | ICD-10-CM | POA: Insufficient documentation

## 2015-09-17 DIAGNOSIS — Z5111 Encounter for antineoplastic chemotherapy: Secondary | ICD-10-CM | POA: Diagnosis not present

## 2015-09-17 DIAGNOSIS — Z87891 Personal history of nicotine dependence: Secondary | ICD-10-CM | POA: Diagnosis not present

## 2015-09-17 DIAGNOSIS — R51 Headache: Secondary | ICD-10-CM | POA: Diagnosis not present

## 2015-09-17 DIAGNOSIS — Z79899 Other long term (current) drug therapy: Secondary | ICD-10-CM

## 2015-09-17 DIAGNOSIS — Z8041 Family history of malignant neoplasm of ovary: Secondary | ICD-10-CM

## 2015-09-17 DIAGNOSIS — C3431 Malignant neoplasm of lower lobe, right bronchus or lung: Secondary | ICD-10-CM

## 2015-09-17 DIAGNOSIS — R030 Elevated blood-pressure reading, without diagnosis of hypertension: Secondary | ICD-10-CM | POA: Insufficient documentation

## 2015-09-17 DIAGNOSIS — F419 Anxiety disorder, unspecified: Secondary | ICD-10-CM | POA: Insufficient documentation

## 2015-09-17 DIAGNOSIS — C3480 Malignant neoplasm of overlapping sites of unspecified bronchus and lung: Principal | ICD-10-CM

## 2015-09-17 DIAGNOSIS — C33 Malignant neoplasm of trachea: Secondary | ICD-10-CM

## 2015-09-17 DIAGNOSIS — F329 Major depressive disorder, single episode, unspecified: Secondary | ICD-10-CM | POA: Insufficient documentation

## 2015-09-17 LAB — URINALYSIS COMPLETE WITH MICROSCOPIC (ARMC ONLY)
BILIRUBIN URINE: NEGATIVE
Glucose, UA: NEGATIVE mg/dL
Ketones, ur: NEGATIVE mg/dL
LEUKOCYTES UA: NEGATIVE
Nitrite: NEGATIVE
PH: 6 (ref 5.0–8.0)
PROTEIN: NEGATIVE mg/dL
Specific Gravity, Urine: 1.005 (ref 1.005–1.030)

## 2015-09-17 LAB — COMPREHENSIVE METABOLIC PANEL
ALBUMIN: 4.2 g/dL (ref 3.5–5.0)
ALK PHOS: 60 U/L (ref 38–126)
ALT: 20 U/L (ref 14–54)
ANION GAP: 8 (ref 5–15)
AST: 36 U/L (ref 15–41)
BUN: 14 mg/dL (ref 6–20)
CALCIUM: 8.9 mg/dL (ref 8.9–10.3)
CHLORIDE: 106 mmol/L (ref 101–111)
CO2: 22 mmol/L (ref 22–32)
Creatinine, Ser: 0.57 mg/dL (ref 0.44–1.00)
GFR calc Af Amer: >60 mL/min (ref >60)
GFR calc non Af Amer: >60 mL/min (ref >60)
GLUCOSE: 96 mg/dL (ref 65–99)
Potassium: 3.7 mmol/L (ref 3.5–5.1)
SODIUM: 136 mmol/L (ref 135–145)
Total Bilirubin: 0.5 mg/dL (ref 0.3–1.2)
Total Protein: 7.2 g/dL (ref 6.5–8.1)

## 2015-09-17 LAB — CBC WITH DIFFERENTIAL/PLATELET
Basophils Absolute: 0 10*3/uL (ref 0–0.1)
Basophils Relative: 1 %
EOS ABS: 0.1 10*3/uL (ref 0–0.7)
Eosinophils Relative: 2 %
HCT: 31.8 % — ABNORMAL LOW (ref 35.0–47.0)
HEMOGLOBIN: 11.2 g/dL — AB (ref 12.0–16.0)
LYMPHS ABS: 0.8 10*3/uL — AB (ref 1.0–3.6)
LYMPHS PCT: 18 %
MCH: 38.1 pg — AB (ref 26.0–34.0)
MCHC: 35.3 g/dL (ref 32.0–36.0)
MCV: 108 fL — AB (ref 80.0–100.0)
MONOS PCT: 15 %
Monocytes Absolute: 0.7 10*3/uL (ref 0.2–0.9)
NEUTROS PCT: 64 %
Neutro Abs: 2.7 10*3/uL (ref 1.4–6.5)
Platelets: 247 10*3/uL (ref 150–440)
RBC: 2.94 MIL/uL — AB (ref 3.80–5.20)
RDW: 16.3 % — ABNORMAL HIGH (ref 11.5–14.5)
WBC: 4.3 10*3/uL (ref 3.6–11.0)

## 2015-09-17 MED ORDER — SODIUM CHLORIDE 0.9 % IV SOLN
Freq: Once | INTRAVENOUS | Status: AC
  Administered 2015-09-17: 11:00:00 via INTRAVENOUS
  Filled 2015-09-17: qty 1000

## 2015-09-17 MED ORDER — PROCHLORPERAZINE MALEATE 10 MG PO TABS
10.0000 mg | ORAL_TABLET | Freq: Once | ORAL | Status: AC
  Administered 2015-09-17: 10 mg via ORAL
  Filled 2015-09-17: qty 1

## 2015-09-17 MED ORDER — SODIUM CHLORIDE 0.9 % IV SOLN
500.0000 mg/m2 | Freq: Once | INTRAVENOUS | Status: AC
  Administered 2015-09-17: 775 mg via INTRAVENOUS
  Filled 2015-09-17: qty 20

## 2015-09-17 MED ORDER — HEPARIN SOD (PORK) LOCK FLUSH 100 UNIT/ML IV SOLN
500.0000 [IU] | Freq: Once | INTRAVENOUS | Status: AC | PRN
  Administered 2015-09-17: 500 [IU]
  Filled 2015-09-17: qty 5

## 2015-09-17 NOTE — Progress Notes (Signed)
[place] OFFICE PROGRESS NOTE  Patient Care Team: Mar Daring, PA-C as PCP - General (Family Medicine) Allyne Gee, MD as Referring Physician (Internal Medicine)  Malignant neoplasm of trachea, bronchus, and lung [facility])   Staging form: Lung, AJCC 7th Edition     Clinical: Stage IIIB (T4, N2, M0) - Signed by Cammie Sickle, MD on 06/24/2015    Oncology History   # FEB 2017-[place] Right Lower Lung T4N2M0- STAGE IIIB [Bronch & Subcarinal LNBx]- March 8th START CARBO-ALIMTA x4 cycles- June 8th 2017 - IMPROVED FDG MULTI-FOCAL lesions/N2-LN activity. S/p [place]-- Alimta x6  # AUG 18th- Alimta- Avastin maintenance; SEP 13th DISCONTINUE AVASTIN [sec to cavitary lesion]  MOLECULAR STUDIES:  KRAS MUTATED/Tissues not sufficient for OTHER the molecular marker; will need repeat Bx       Malignant neoplasm of trachea, bronchus, and lung ([place])   06/24/2015 Initial Diagnosis    Malignant neoplasm of trachea, bronchus, and lung (HCC)       Malignant neoplasm of lower lobe, right bronchus or lung ([place])   07/23/2015 Initial Diagnosis    Malignant neoplasm of lower lobe, right bronchus or lung (HCC)        INTERVAL HISTORY:  Deanna Blair 60 y.o.  female pleasant patient above history of T4 N2 adenocarcinoma the lung currently on chemotherapy with carboplatin and Alimta 6 cycles. Patient is status post first cycle of Avastin and Alimta approximately 3 weeks ago.  She continues to work. Appetite is good. No weight loss. No nausea no vomiting. No chest pain or shortness of breath or cough. No hemoptysis. No headaches. Complains of fatigue.  REVIEW OF SYSTEMS:  A complete 10 point review of system is done which is negative except mentioned above/history of present illness.   PAST MEDICAL HISTORY :  Past Medical History:  Diagnosis Date  . Anxiety   . Cancer of right lung ([place]) 02/13/2015  . Depression   . Depression with anxiety    Well controlled with  Wellbutrin and Buspar  . Pneumonia     PAST SURGICAL HISTORY :   Past Surgical History:  Procedure Laterality Date  . ECTOPIC PREGNANCY SURGERY  1988  . ELECTROMAGNETIC NAVIGATION BROCHOSCOPY N/A 02/10/2015   Procedure: ELECTROMAGNETIC NAVIGATION BRONCHOSCOPY;  Surgeon: Flora Lipps, MD;  Location: [facility];  Service: Cardiopulmonary;  Laterality: N/A;  . ENDOBRONCHIAL ULTRASOUND N/A 02/10/2015   Procedure: ENDOBRONCHIAL ULTRASOUND;  Surgeon: Flora Lipps, MD;  Location: [facility];  Service: Cardiopulmonary;  Laterality: N/A;  . PERIPHERAL VASCULAR CATHETERIZATION N/A 03/05/2015   Procedure: Glori Luis Cath Insertion;  Surgeon: Katha Cabal, MD;  Location: [place] [facility];  Service: Cardiovascular;  Laterality: N/A;    FAMILY HISTORY :   Family History  Problem Relation Age of Onset  . Cancer Father     Prostate Cancer  . Hypertension Father   . Stroke Father   . Hypertension Brother     SOCIAL HISTORY:   Social History  Substance Use Topics  . Smoking status: Former Smoker    Packs/day: 0.25    Years: 30.00    Types: Cigarettes    Quit date: 02/06/2005  . Smokeless tobacco: Never Used  . Alcohol use 3.0 oz/week    5 Cans of beer per week     Comment: 5 beers weekly    ALLERGIES:  has No Known Allergies.  MEDICATIONS:  Current Outpatient Prescriptions  Medication Sig Dispense Refill  . busPIRone (BUSPAR) 5 MG tablet TAKE  ONE TABLET BY MOUTH TWICE DAILY 60 tablet 6  . dexamethasone (DECADRON) 4 MG tablet Take 1 tablet by mouth at 8am and 5pm on day 2 and day 3 after chemotherapy. 30 tablet 0  . folic acid (FOLVITE) 1 MG tablet Take 1 tablet (1 mg total) by mouth daily. 30 tablet 3  . lidocaine-prilocaine (EMLA) cream Apply 1 application topically as needed. 30 g 2  . SPIRIVA RESPIMAT 1.25 MCG/ACT AERS     . SYMBICORT 80-4.5 MCG/ACT inhaler     . terconazole (TERAZOL 7) 0.4 % vaginal cream Place 1 applicator vaginally at bedtime. X 7 days 45 g 0  . zolpidem (AMBIEN) 5 MG  tablet Take 1 tablet (5 mg total) by mouth at bedtime as needed for sleep. (Patient not taking: Reported on 09/17/2015) 30 tablet 0   No current facility-administered medications for this visit.     PHYSICAL EXAMINATION: ECOG PERFORMANCE STATUS: 0 - Asymptomatic  BP (!) 154/95 (BP Location: Left Arm, Patient Position: Sitting)   Pulse (!) 58   Temp 97.1 F (36.2 C) (Tympanic)   Resp 18   Ht _0  (1.575 m)   Wt 125 lb 9.6 oz (57 kg)   LMP 06/16/2000 (Approximate) Comment: 15 yrs ago  SpO2 100%   BMI 22.97 kg/m   Filed Weights   09/17/15 0925  Weight: 125 lb 9.6 oz (57 kg)    GENERAL: Well-nourished well-developed; Alert, no distress and comfortable.   She is alone. EYES: no pallor or icterus OROPHARYNX: no thrush or ulceration; good dentition  NECK: supple, no masses felt LYMPH:  no palpable lymphadenopathy in the cervical, axillary or inguinal regions LUNGS: clear to auscultation and  No wheeze or crackles HEART/CVS: regular rate & rhythm and no murmurs; No lower extremity edema ABDOMEN:abdomen soft, non-tender and normal bowel sounds Musculoskeletal:no cyanosis of digits and no clubbing  PSYCH: alert & oriented x 3 with fluent speech NEURO: no focal motor/sensory deficits SKIN:  no rashes or significant lesions  LABORATORY DATA:  I have reviewed the data as listed    Component Value Date/Time   NA 136 09/17/2015 0904   NA 139 01/03/2015 0910   K 3.7 09/17/2015 0904   CL 106 09/17/2015 0904   CO2 22 09/17/2015 0904   GLUCOSE 96 09/17/2015 0904   BUN 14 09/17/2015 0904   BUN 10 01/03/2015 0910   CREATININE 0.57 09/17/2015 0904   CALCIUM 8.9 09/17/2015 0904   PROT 7.2 09/17/2015 0904   PROT 6.5 01/03/2015 0910   ALBUMIN 4.2 09/17/2015 0904   ALBUMIN 3.8 01/03/2015 0910   AST 36 09/17/2015 0904   ALT 20 09/17/2015 0904   ALKPHOS 60 09/17/2015 0904   BILITOT 0.5 09/17/2015 0904   BILITOT <0.2 01/03/2015 0910   GFRNONAA >60 09/17/2015 0904   GFRAA >60  09/17/2015 0904    No results found for: SPEP, UPEP  Lab Results  Component Value Date   WBC 4.3 09/17/2015   NEUTROABS 2.7 09/17/2015   HGB 11.2 (L) 09/17/2015   HCT 31.8 (L) 09/17/2015   MCV 108.0 (H) 09/17/2015   PLT 247 09/17/2015      Chemistry      Component Value Date/Time   NA 136 09/17/2015 0904   NA 139 01/03/2015 0910   K 3.7 09/17/2015 0904   CL 106 09/17/2015 0904   CO2 22 09/17/2015 0904   BUN 14 09/17/2015 0904   BUN 10 01/03/2015 0910   CREATININE 0.57 09/17/2015 4888  Component Value Date/Time   CALCIUM 8.9 09/17/2015 0904   ALKPHOS 60 09/17/2015 0904   AST 36 09/17/2015 0904   ALT 20 09/17/2015 0904   BILITOT 0.5 09/17/2015 0904   BILITOT <0.2 01/03/2015 0910       RADIOGRAPHIC STUDIES: I have personally reviewed the radiological images as listed and agreed with the findings in the report. No results found.   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   # proceed with Alimta maintence #2  today. Labs okay- except for hb 10 today. Discontinue Avastin sec to cavitary lesion RLL.   # Headaches- ? Elevated Blood pressures; get cuff at home. Get log next time.   # follow up in 3 week/s chemo; awaiting opinion at [facility].  #   Orders Placed This Encounter  Procedures  . CBC with Differential    Standing Status:   Future    Standing Expiration Date:   09/16/2016  . Comprehensive metabolic panel    Standing Status:   Future    Standing Expiration Date:   09/16/2016     Cammie Sickle, MD 09/17/2015 5:23 PM

## 2015-09-17 NOTE — Progress Notes (Signed)
No changes since last visit. 

## 2015-09-17 NOTE — Assessment & Plan Note (Addendum)
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   # proceed with Alimta maintence #2  today. Labs okay- except for hb 10 today. Discontinue Avastin sec to cavitary lesion RLL.   # Headaches- ? Elevated Blood pressures; get cuff at home. Get log next time.   # follow up in 3 week/s chemo; awaiting opinion at [facility].  #

## 2015-10-06 ENCOUNTER — Other Ambulatory Visit: Payer: Self-pay | Admitting: Oncology

## 2015-10-06 DIAGNOSIS — C3481 Malignant neoplasm of overlapping sites of right bronchus and lung: Secondary | ICD-10-CM

## 2015-10-08 ENCOUNTER — Inpatient Hospital Stay: Payer: BLUE CROSS/BLUE SHIELD

## 2015-10-08 ENCOUNTER — Other Ambulatory Visit: Payer: Self-pay | Admitting: *Deleted

## 2015-10-08 ENCOUNTER — Inpatient Hospital Stay: Payer: BLUE CROSS/BLUE SHIELD | Attending: Internal Medicine | Admitting: Internal Medicine

## 2015-10-08 VITALS — BP 130/81 | HR 62

## 2015-10-08 DIAGNOSIS — C3431 Malignant neoplasm of lower lobe, right bronchus or lung: Secondary | ICD-10-CM

## 2015-10-08 DIAGNOSIS — Z8042 Family history of malignant neoplasm of prostate: Secondary | ICD-10-CM | POA: Diagnosis not present

## 2015-10-08 DIAGNOSIS — Z5111 Encounter for antineoplastic chemotherapy: Secondary | ICD-10-CM | POA: Diagnosis not present

## 2015-10-08 DIAGNOSIS — D72819 Decreased white blood cell count, unspecified: Secondary | ICD-10-CM | POA: Diagnosis not present

## 2015-10-08 DIAGNOSIS — G893 Neoplasm related pain (acute) (chronic): Secondary | ICD-10-CM | POA: Diagnosis not present

## 2015-10-08 DIAGNOSIS — R531 Weakness: Secondary | ICD-10-CM | POA: Diagnosis not present

## 2015-10-08 DIAGNOSIS — D649 Anemia, unspecified: Secondary | ICD-10-CM | POA: Diagnosis not present

## 2015-10-08 DIAGNOSIS — R5383 Other fatigue: Secondary | ICD-10-CM | POA: Diagnosis not present

## 2015-10-08 DIAGNOSIS — Z87891 Personal history of nicotine dependence: Secondary | ICD-10-CM | POA: Insufficient documentation

## 2015-10-08 DIAGNOSIS — Z8701 Personal history of pneumonia (recurrent): Secondary | ICD-10-CM | POA: Insufficient documentation

## 2015-10-08 DIAGNOSIS — C3481 Malignant neoplasm of overlapping sites of right bronchus and lung: Secondary | ICD-10-CM

## 2015-10-08 DIAGNOSIS — F329 Major depressive disorder, single episode, unspecified: Secondary | ICD-10-CM | POA: Insufficient documentation

## 2015-10-08 DIAGNOSIS — Z79899 Other long term (current) drug therapy: Secondary | ICD-10-CM | POA: Diagnosis not present

## 2015-10-08 DIAGNOSIS — F419 Anxiety disorder, unspecified: Secondary | ICD-10-CM | POA: Insufficient documentation

## 2015-10-08 LAB — COMPREHENSIVE METABOLIC PANEL
ALBUMIN: 4.2 g/dL (ref 3.5–5.0)
ALT: 28 U/L (ref 14–54)
ANION GAP: 7 (ref 5–15)
AST: 43 U/L — ABNORMAL HIGH (ref 15–41)
Alkaline Phosphatase: 81 U/L (ref 38–126)
BUN: 12 mg/dL (ref 6–20)
CALCIUM: 9.3 mg/dL (ref 8.9–10.3)
CO2: 24 mmol/L (ref 22–32)
Chloride: 103 mmol/L (ref 101–111)
Creatinine, Ser: 0.68 mg/dL (ref 0.44–1.00)
GFR calc non Af Amer: >60 mL/min (ref >60)
GLUCOSE: 104 mg/dL — AB (ref 65–99)
POTASSIUM: 3.8 mmol/L (ref 3.5–5.1)
SODIUM: 134 mmol/L — AB (ref 135–145)
TOTAL PROTEIN: 7.5 g/dL (ref 6.5–8.1)
Total Bilirubin: 0.5 mg/dL (ref 0.3–1.2)

## 2015-10-08 LAB — CBC WITH DIFFERENTIAL/PLATELET
BASOS PCT: 1 %
Basophils Absolute: 0 10*3/uL (ref 0–0.1)
EOS ABS: 0.1 10*3/uL (ref 0–0.7)
EOS PCT: 3 %
HCT: 32.8 % — ABNORMAL LOW (ref 35.0–47.0)
Hemoglobin: 11.7 g/dL — ABNORMAL LOW (ref 12.0–16.0)
LYMPHS ABS: 0.7 10*3/uL — AB (ref 1.0–3.6)
Lymphocytes Relative: 16 %
MCH: 38 pg — AB (ref 26.0–34.0)
MCHC: 35.6 g/dL (ref 32.0–36.0)
MCV: 106.8 fL — ABNORMAL HIGH (ref 80.0–100.0)
MONO ABS: 0.7 10*3/uL (ref 0.2–0.9)
MONOS PCT: 17 %
NEUTROS PCT: 63 %
Neutro Abs: 2.8 10*3/uL (ref 1.4–6.5)
Platelets: 249 10*3/uL (ref 150–440)
RBC: 3.07 MIL/uL — ABNORMAL LOW (ref 3.80–5.20)
RDW: 15.3 % — AB (ref 11.5–14.5)
WBC: 4.4 10*3/uL (ref 3.6–11.0)

## 2015-10-08 MED ORDER — SODIUM CHLORIDE 0.9 % IV SOLN
Freq: Once | INTRAVENOUS | Status: AC
  Administered 2015-10-08: 12:00:00 via INTRAVENOUS
  Filled 2015-10-08: qty 1000

## 2015-10-08 MED ORDER — DEXAMETHASONE 4 MG PO TABS: ORAL_TABLET | ORAL | 3 refills | Status: DC

## 2015-10-08 MED ORDER — PROCHLORPERAZINE MALEATE 10 MG PO TABS
10.0000 mg | ORAL_TABLET | Freq: Once | ORAL | Status: AC
Start: 2015-10-08 — End: 2015-10-08
  Administered 2015-10-08: 10 mg via ORAL
  Filled 2015-10-08: qty 1

## 2015-10-08 MED ORDER — CYANOCOBALAMIN 1000 MCG/ML IJ SOLN
1000.0000 ug | Freq: Once | INTRAMUSCULAR | Status: AC
  Administered 2015-10-08: 1000 ug via INTRAMUSCULAR
  Filled 2015-10-08: qty 1

## 2015-10-08 MED ORDER — ZOLPIDEM TARTRATE 5 MG PO TABS: 5.0000 mg | ORAL_TABLET | Freq: Every evening | ORAL | 5 refills | Status: DC | PRN

## 2015-10-08 MED ORDER — PEMETREXED DISODIUM CHEMO INJECTION 500 MG
500.0000 mg/m2 | Freq: Once | INTRAVENOUS | Status: AC
  Administered 2015-10-08: 775 mg via INTRAVENOUS
  Filled 2015-10-08: qty 20

## 2015-10-08 MED ORDER — HEPARIN SOD (PORK) LOCK FLUSH 100 UNIT/ML IV SOLN
500.0000 [IU] | Freq: Once | INTRAVENOUS | Status: AC | PRN
  Administered 2015-10-08: 500 [IU]
  Filled 2015-10-08: qty 5

## 2015-10-08 NOTE — Telephone Encounter (Signed)
States she was to get 2 meds called in today when she was here, Ambien and something to take the day after chemo and she had chemo today. Pharmacy did not get anything from [place]

## 2015-10-08 NOTE — Progress Notes (Signed)
Pt verbalizes has increased fatigue and a body ache for about 5 days.  Pt would like to know if she can have refill on dexamethsone

## 2015-10-08 NOTE — Telephone Encounter (Signed)
Pharmacy sent in request rf for decadron s/p chemo.  She is taking almita.   Md can- rf ambien.   Patient made aware. rx sent to pharmacy

## 2015-10-08 NOTE — Assessment & Plan Note (Addendum)
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 Currently on maintenance Alimta tolerating well except for significant fatigue and body aches.   # proceed with Alimta maintence #3  today. Labs okay- except for hb 10 today.  Discontinue Avastin sec to cavitary lesion RLL. Will get CT scan prior to next visit.   # Bodyaches muscle aches fatigue- question related to Alimta. Patient was treated in taking a break from treatment. Await CT scan as ordered; immunotherapy   # head aches- improved.   # follow up in 3 week/s chemo/labs/CT scan prior.

## 2015-10-08 NOTE — Progress Notes (Signed)
[place] OFFICE PROGRESS NOTE  Patient Care Team: Mar Daring, PA-C as PCP - General (Family Medicine) Allyne Gee, MD as Referring Physician (Internal Medicine)  Malignant neoplasm of trachea, bronchus, and lung [facility])   Staging form: Lung, AJCC 7th Edition     Clinical: Stage IIIB (T4, N2, M0) - Signed by Cammie Sickle, MD on 06/24/2015    Oncology History   # FEB 2017-[place] CA Right Lower Lung T4N2M0- STAGE IIIB [Bronch & Subcarinal LNBx]- March 8th START CARBO-ALIMTA x4 cycles- June 8th 2017 - IMPROVED FDG MULTI-FOCAL lesions/N2-LN activity. S/p [place]-- Alimta x6  # AUG 18th- Alimta- Avastin maintenance; SEP 13th DISCONTINUE AVASTIN [sec to cavitary lesion]  MOLECULAR STUDIES:  KRAS MUTATED/Tissues not sufficient for OTHER the molecular marker; will need repeat Bx       Malignant neoplasm of trachea, bronchus, and lung ([place])   06/24/2015 Initial Diagnosis    Malignant neoplasm of trachea, bronchus, and lung (HCC)       Malignant neoplasm of lower lobe, right bronchus or lung   07/23/2015 Initial Diagnosis    Malignant neoplasm of lower lobe, right bronchus or lung (HCC)        INTERVAL HISTORY:  Deanna Blair 60 y.o.  female pleasant patient above history of T4 N2 adenocarcinoma the lung currently on chemotherapy with carboplatin and Alimta 6 cycles. Patient is status post Second maintenance Alimta approximately 3 weeks ago.  Patient complains of significant fatigue. She also complains of body aches muscle aches after the chemotherapy. This is interrupting her daily lifestyle.   No weight loss. No nausea no vomiting. No chest pain or shortness of breath or cough. No hemoptysis. No headaches.  REVIEW OF SYSTEMS:  A complete 10 point review of system is done which is negative except mentioned above/history of present illness.   PAST MEDICAL HISTORY :  Past Medical History:  Diagnosis Date  . Anxiety   . Cancer of right lung  ([place]) 02/13/2015  . Depression   . Depression with anxiety    Well controlled with Wellbutrin and Buspar  . Pneumonia     PAST SURGICAL HISTORY :   Past Surgical History:  Procedure Laterality Date  . ECTOPIC PREGNANCY SURGERY  1988  . ELECTROMAGNETIC NAVIGATION BROCHOSCOPY N/A 02/10/2015   Procedure: ELECTROMAGNETIC NAVIGATION BRONCHOSCOPY;  Surgeon: Flora Lipps, MD;  Location: [facility] ORS;  Service: Cardiopulmonary;  Laterality: N/A;  . ENDOBRONCHIAL ULTRASOUND N/A 02/10/2015   Procedure: ENDOBRONCHIAL ULTRASOUND;  Surgeon: Flora Lipps, MD;  Location: [facility] ORS;  Service: Cardiopulmonary;  Laterality: N/A;  . PERIPHERAL VASCULAR CATHETERIZATION N/A 03/05/2015   Procedure: Glori Luis Cath Insertion;  Surgeon: Katha Cabal, MD;  Location: [place] [facility];  Service: Cardiovascular;  Laterality: N/A;    FAMILY HISTORY :   Family History  Problem Relation Age of Onset  . Cancer Father     Prostate Cancer  . Hypertension Father   . Stroke Father   . Hypertension Brother     SOCIAL HISTORY:   Social History  Substance Use Topics  . Smoking status: Former Smoker    Packs/day: 0.25    Years: 30.00    Types: Cigarettes    Quit date: 02/06/2005  . Smokeless tobacco: Never Used  . Alcohol use 3.0 oz/week    5 Cans of beer per week     Comment: 5 beers weekly    ALLERGIES:  has No Known Allergies.  MEDICATIONS:  Current Outpatient Prescriptions  Medication Sig  Dispense Refill  . busPIRone (BUSPAR) 5 MG tablet TAKE ONE TABLET BY MOUTH TWICE DAILY 60 tablet 6  . folic acid (FOLVITE) 1 MG tablet Take 1 tablet (1 mg total) by mouth daily. 30 tablet 3  . lidocaine-prilocaine (EMLA) cream Apply 1 application topically as needed. 30 g 2  . SPIRIVA RESPIMAT 1.25 MCG/ACT AERS     . SYMBICORT 80-4.5 MCG/ACT inhaler     . terconazole (TERAZOL 7) 0.4 % vaginal cream Place 1 applicator vaginally at bedtime. X 7 days 45 g 0  . dexamethasone (DECADRON) 4 MG tablet Take 1 tablet by mouth at 8am and  5pm on day 2 and day 3 after chemotherapy. 30 tablet 3  . zolpidem (AMBIEN) 5 MG tablet Take 1 tablet (5 mg total) by mouth at bedtime as needed for sleep. 30 tablet 5   No current facility-administered medications for this visit.     PHYSICAL EXAMINATION: ECOG PERFORMANCE STATUS: 0 - Asymptomatic  BP (!) 152/86 (BP Location: Right Arm, Patient Position: Sitting)   Pulse 68   Temp 97.6 F (36.4 C) (Tympanic)   Resp 16   Ht _0  (1.575 m)   Wt 124 lb 12.8 oz (56.6 kg)   LMP 06/16/2000 (Approximate) Comment: 15 yrs ago  SpO2 98%   BMI 22.83 kg/m   Filed Weights   10/08/15 1047  Weight: 124 lb 12.8 oz (56.6 kg)    GENERAL: Well-nourished well-developed; Alert, no distress and comfortable.   She is alone. EYES: no pallor or icterus OROPHARYNX: no thrush or ulceration; good dentition  NECK: supple, no masses felt LYMPH:  no palpable lymphadenopathy in the cervical, axillary or inguinal regions LUNGS: clear to auscultation and  No wheeze or crackles HEART/CVS: regular rate & rhythm and no murmurs; No lower extremity edema ABDOMEN:abdomen soft, non-tender and normal bowel sounds Musculoskeletal:no cyanosis of digits and no clubbing  PSYCH: alert & oriented x 3 with fluent speech NEURO: no focal motor/sensory deficits SKIN:  no rashes or significant lesions  LABORATORY DATA:  I have reviewed the data as listed    Component Value Date/Time   NA 134 (L) 10/08/2015 0925   NA 139 01/03/2015 0910   K 3.8 10/08/2015 0925   CL 103 10/08/2015 0925   CO2 24 10/08/2015 0925   GLUCOSE 104 (H) 10/08/2015 0925   BUN 12 10/08/2015 0925   BUN 10 01/03/2015 0910   CREATININE 0.68 10/08/2015 0925   CALCIUM 9.3 10/08/2015 0925   PROT 7.5 10/08/2015 0925   PROT 6.5 01/03/2015 0910   ALBUMIN 4.2 10/08/2015 0925   ALBUMIN 3.8 01/03/2015 0910   AST 43 (H) 10/08/2015 0925   ALT 28 10/08/2015 0925   ALKPHOS 81 10/08/2015 0925   BILITOT 0.5 10/08/2015 0925   BILITOT <0.2 01/03/2015 0910    GFRNONAA >60 10/08/2015 0925   GFRAA >60 10/08/2015 0925    No results found for: SPEP, UPEP  Lab Results  Component Value Date   WBC 4.4 10/08/2015   NEUTROABS 2.8 10/08/2015   HGB 11.7 (L) 10/08/2015   HCT 32.8 (L) 10/08/2015   MCV 106.8 (H) 10/08/2015   PLT 249 10/08/2015      Chemistry      Component Value Date/Time   NA 134 (L) 10/08/2015 0925   NA 139 01/03/2015 0910   K 3.8 10/08/2015 0925   CL 103 10/08/2015 0925   CO2 24 10/08/2015 0925   BUN 12 10/08/2015 0925   BUN 10 01/03/2015 0910  CREATININE 0.68 10/08/2015 0925      Component Value Date/Time   CALCIUM 9.3 10/08/2015 0925   ALKPHOS 81 10/08/2015 0925   AST 43 (H) 10/08/2015 0925   ALT 28 10/08/2015 0925   BILITOT 0.5 10/08/2015 0925   BILITOT <0.2 01/03/2015 0910       RADIOGRAPHIC STUDIES: I have personally reviewed the radiological images as listed and agreed with the findings in the report. No results found.   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 Currently on maintenance Alimta tolerating well except for significant fatigue and body aches.   # proceed with Alimta maintence #3  today. Labs okay- except for hb 10 today.  Discontinue Avastin sec to cavitary lesion RLL. Will get CT scan prior to next visit.   # Bodyaches muscle aches fatigue- question related to Alimta. Patient was treated in taking a break from treatment. Await CT scan as ordered; immunotherapy   # head aches- improved.   # follow up in 3 week/s chemo/labs/CT scan prior.  Orders Placed This Encounter  Procedures  . CT CHEST W CONTRAST    Standing Status:   Future    Standing Expiration Date:   12/07/2016    Order Specific Question:   Reason for Exam (SYMPTOM  OR DIAGNOSIS REQUIRED)    Answer:   lung cancer    Order Specific Question:   Is the patient pregnant?    Answer:   No    Order Specific  Question:   Preferred imaging location?    Answer:   [facility]  . CBC with Differential    Standing Status:   Future    Standing Expiration Date:   10/07/2016  . Comprehensive metabolic panel    Standing Status:   Future    Standing Expiration Date:   10/07/2016     Cammie Sickle, MD 10/08/2015 5:58 PM

## 2015-10-09 DIAGNOSIS — C3410 Malignant neoplasm of upper lobe, unspecified bronchus or lung: Secondary | ICD-10-CM | POA: Insufficient documentation

## 2015-10-20 ENCOUNTER — Telehealth: Payer: Self-pay | Admitting: *Deleted

## 2015-10-20 NOTE — Telephone Encounter (Signed)
Returned Patient's call. She is requesting short term disability due ongoing fatigue. She will have her work place fax the forms tomorrow.

## 2015-10-24 ENCOUNTER — Ambulatory Visit
Admission: RE | Admit: 2015-10-24 | Discharge: 2015-10-24 | Disposition: A | Discharge status: Home / Self Care | Payer: BLUE CROSS/BLUE SHIELD | Source: Ambulatory Visit | Attending: Internal Medicine | Admitting: Internal Medicine

## 2015-10-24 DIAGNOSIS — C3431 Malignant neoplasm of lower lobe, right bronchus or lung: Secondary | ICD-10-CM | POA: Insufficient documentation

## 2015-10-24 DIAGNOSIS — I700 Atherosclerosis of aorta: Secondary | ICD-10-CM | POA: Diagnosis not present

## 2015-10-24 DIAGNOSIS — R590 Localized enlarged lymph nodes: Secondary | ICD-10-CM | POA: Diagnosis not present

## 2015-10-24 DIAGNOSIS — J439 Emphysema, unspecified: Secondary | ICD-10-CM | POA: Insufficient documentation

## 2015-10-24 MED ORDER — IOPAMIDOL (ISOVUE-300) INJECTION 61%
75.0000 mL | Freq: Once | INTRAVENOUS | Status: AC | PRN
Start: 2015-10-24 — End: 2015-10-24
  Administered 2015-10-24: 75 mL via INTRAVENOUS

## 2015-10-28 NOTE — Progress Notes (Signed)
[place] OFFICE PROGRESS NOTE  Patient Care Team: Mar Daring, PA-C as PCP - General (Family Medicine) Allyne Gee, MD as Referring Physician (Internal Medicine)  Malignant neoplasm of trachea, bronchus, and lung [facility])   Staging form: Lung, AJCC 7th Edition     Clinical: Stage IIIB (T4, N2, M0) - Signed by Cammie Sickle, MD on 06/24/2015    Oncology History   # FEB 2017-[place] Right Lower Lung T4N2M0- STAGE IIIB [Bronch & Subcarinal LNBx]- March 8th START CARBO-ALIMTA x4 cycles- June 8th 2017 - IMPROVED FDG MULTI-FOCAL lesions/N2-LN activity. S/p [place]-- Alimta x6  # AUG 18th- Alimta- Avastin maintenance; SEP 13th DISCONTINUE AVASTIN [sec to cavitary lesion]; OCT 20th 2017- CT-STABLE Disease  MOLECULAR STUDIES:  KRAS MUTATED/Tissues not sufficient for OTHER the molecular marker; will need repeat Bx       Malignant neoplasm of trachea, bronchus, and lung ([place])   06/24/2015 Initial Diagnosis    Malignant neoplasm of trachea, bronchus, and lung (HCC)       Malignant neoplasm of lower lobe, right bronchus or lung   07/23/2015 Initial Diagnosis    Malignant neoplasm of lower lobe, right bronchus or lung (HCC)       Primary cancer of right lower lobe of lung ([place])   10/08/2015 Initial Diagnosis    Primary cancer of right lower lobe of lung ([place])       INTERVAL HISTORY:  Deanna Blair 59 y.o.  female pleasant patient above history of T4 N2 adenocarcinoma the lung Who returns to clinic today for discussion of her imaging results and continuation of maintenance Alimta. She continues to have significant weakness and fatigue, but is otherwise tolerating her treatments well. She has no neurologic complaints. She denies any recent fevers. She has a poor appetite, but denies weight loss. She denies any chest pain, shortness of breath, cough, or hemoptysis. She has no nausea, vomiting, constipation, or diarrhea. She has no urinary complaints.  Patient otherwise feels well and offers no further specific complaints.   REVIEW OF SYSTEMS:  A complete 10 point review of system is done which is negative except mentioned above/history of present illness.   PAST MEDICAL HISTORY :  Past Medical History:  Diagnosis Date  . Anxiety   . Cancer of right lung ([place]) 02/13/2015  . Depression   . Depression with anxiety    Well controlled with Wellbutrin and Buspar  . Pneumonia     PAST SURGICAL HISTORY :   Past Surgical History:  Procedure Laterality Date  . ECTOPIC PREGNANCY SURGERY  1988  . ELECTROMAGNETIC NAVIGATION BROCHOSCOPY N/A 02/10/2015   Procedure: ELECTROMAGNETIC NAVIGATION BRONCHOSCOPY;  Surgeon: Flora Lipps, MD;  Location: [facility] ORS;  Service: Cardiopulmonary;  Laterality: N/A;  . ENDOBRONCHIAL ULTRASOUND N/A 02/10/2015   Procedure: ENDOBRONCHIAL ULTRASOUND;  Surgeon: Flora Lipps, MD;  Location: [facility] ORS;  Service: Cardiopulmonary;  Laterality: N/A;  . PERIPHERAL VASCULAR CATHETERIZATION N/A 03/05/2015   Procedure: Glori Luis Cath Insertion;  Surgeon: Katha Cabal, MD;  Location: [place] [facility] LAB;  Service: Cardiovascular;  Laterality: N/A;    FAMILY HISTORY :   Family History  Problem Relation Age of Onset  . Cancer Father     Prostate Cancer  . Hypertension Father   . Stroke Father   . Hypertension Brother     SOCIAL HISTORY:   Social History  Substance Use Topics  . Smoking status: Former Smoker    Packs/day: 0.25    Years: 30.00  Types: Cigarettes    Quit date: 02/06/2005  . Smokeless tobacco: Never Used  . Alcohol use 3.0 oz/week    5 Cans of beer per week     Comment: 5 beers weekly    ALLERGIES:  has No Known Allergies.  MEDICATIONS:  Current Outpatient Prescriptions  Medication Sig Dispense Refill  . busPIRone (BUSPAR) 5 MG tablet TAKE ONE TABLET BY MOUTH TWICE DAILY 60 tablet 6  . dexamethasone (DECADRON) 4 MG tablet Take 1 tablet by mouth at 8am and 5pm on day 2 and day 3 after chemotherapy. 30  tablet 3  . lidocaine-prilocaine (EMLA) cream Apply 1 application topically as needed. 30 g 2  . SPIRIVA RESPIMAT 1.25 MCG/ACT AERS     . SYMBICORT 80-4.5 MCG/ACT inhaler     . terconazole (TERAZOL 7) 0.4 % vaginal cream Place 1 applicator vaginally at bedtime. X 7 days 45 g 0  . folic acid (FOLVITE) 1 MG tablet Take 1 tablet (1 mg total) by mouth daily. 30 tablet 3  . zolpidem (AMBIEN) 5 MG tablet Take 1 tablet (5 mg total) by mouth at bedtime as needed for sleep. (Patient not taking: Reported on 10/29/2015) 30 tablet 5   No current facility-administered medications for this visit.     PHYSICAL EXAMINATION: ECOG PERFORMANCE STATUS: 0 - Asymptomatic  BP 122/87 (BP Location: Left Arm, Patient Position: Sitting)   Pulse 63   Temp 97.2 F (36.2 C) (Tympanic)   Ht 5' 2"  (1.575 m)   Wt 126 lb 1.7 oz (57.2 kg)   LMP 06/16/2000 (Approximate) Comment: 15 yrs ago  SpO2 99%   BMI 23.06 kg/m   Filed Weights   10/29/15 0936  Weight: 126 lb 1.7 oz (57.2 kg)    GENERAL: Well-nourished well-developed; Alert, no distress and comfortable.   She is alone. EYES: no pallor or icterus OROPHARYNX: no thrush or ulceration; good dentition  NECK: supple, no masses felt LYMPH:  no palpable lymphadenopathy in the cervical, axillary or inguinal regions LUNGS: clear to auscultation and  No wheeze or crackles HEART/CVS: regular rate & rhythm and no murmurs; No lower extremity edema ABDOMEN:abdomen soft, non-tender and normal bowel sounds Musculoskeletal:no cyanosis of digits and no clubbing  PSYCH: alert & oriented x 3 with fluent speech NEURO: no focal motor/sensory deficits SKIN:  no rashes or significant lesions  LABORATORY DATA:  I have reviewed the data as listed    Component Value Date/Time   NA 137 10/29/2015 0912   NA 139 01/03/2015 0910   K 4.2 10/29/2015 0912   CL 106 10/29/2015 0912   CO2 22 10/29/2015 0912   GLUCOSE 98 10/29/2015 0912   BUN 12 10/29/2015 0912   BUN 10 01/03/2015  0910   CREATININE 0.74 10/29/2015 0912   CALCIUM 9.2 10/29/2015 0912   PROT 7.4 10/29/2015 0912   PROT 6.5 01/03/2015 0910   ALBUMIN 4.2 10/29/2015 0912   ALBUMIN 3.8 01/03/2015 0910   AST 86 (H) 10/29/2015 0912   ALT 48 10/29/2015 0912   ALKPHOS 69 10/29/2015 0912   BILITOT 0.5 10/29/2015 0912   BILITOT <0.2 01/03/2015 0910   GFRNONAA >60 10/29/2015 0912   GFRAA >60 10/29/2015 0912    No results found for: SPEP, UPEP  Lab Results  Component Value Date   WBC 3.3 (L) 10/29/2015   NEUTROABS 2.0 10/29/2015   HGB 11.8 (L) 10/29/2015   HCT 33.5 (L) 10/29/2015   MCV 108.2 (H) 10/29/2015   PLT 306 10/29/2015  Chemistry      Component Value Date/Time   NA 137 10/29/2015 0912   NA 139 01/03/2015 0910   K 4.2 10/29/2015 0912   CL 106 10/29/2015 0912   CO2 22 10/29/2015 0912   BUN 12 10/29/2015 0912   BUN 10 01/03/2015 0910   CREATININE 0.74 10/29/2015 0912      Component Value Date/Time   CALCIUM 9.2 10/29/2015 0912   ALKPHOS 69 10/29/2015 0912   AST 86 (H) 10/29/2015 0912   ALT 48 10/29/2015 0912   BILITOT 0.5 10/29/2015 0912   BILITOT <0.2 01/03/2015 0910       RADIOGRAPHIC STUDIES: I have personally reviewed the radiological images as listed and agreed with the findings in the report. No results found.   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 Currently on maintenance Alimta tolerating well except for significant fatigue and body aches. Restaging CT scan October 24, 2015 revealed stable to improved disease burden with no new or progressive metastatic disease.  # proceed with Alimta maintence #4  today. Laboratory work adequate to proceed only with a mild leukopenia and anemia. Previously, Avastin was discontinued secondary to cavitary lesion RLL.   # Bodyaches muscle aches fatigue- question related to Alimta. Patient has agreed to proceed with treatment today.   # head aches-  improved.   # follow up in 3 weeks with M.D. evaluation /labs/continuation of maintenance Alimta  No orders of the defined types were placed in this encounter.    Lloyd Huger, MD 11/02/2015 11:44 AM

## 2015-10-29 ENCOUNTER — Inpatient Hospital Stay: Payer: BLUE CROSS/BLUE SHIELD

## 2015-10-29 ENCOUNTER — Inpatient Hospital Stay (HOSPITAL_BASED_OUTPATIENT_CLINIC_OR_DEPARTMENT_OTHER): Payer: BLUE CROSS/BLUE SHIELD | Admitting: Oncology

## 2015-10-29 ENCOUNTER — Encounter: Payer: Self-pay | Admitting: Oncology

## 2015-10-29 ENCOUNTER — Other Ambulatory Visit: Payer: Self-pay

## 2015-10-29 VITALS — BP 122/87 | HR 63 | Temp 97.2°F | Ht 62.0 in | Wt 126.1 lb

## 2015-10-29 DIAGNOSIS — F329 Major depressive disorder, single episode, unspecified: Secondary | ICD-10-CM

## 2015-10-29 DIAGNOSIS — G893 Neoplasm related pain (acute) (chronic): Secondary | ICD-10-CM

## 2015-10-29 DIAGNOSIS — R5383 Other fatigue: Secondary | ICD-10-CM

## 2015-10-29 DIAGNOSIS — R531 Weakness: Secondary | ICD-10-CM

## 2015-10-29 DIAGNOSIS — C3431 Malignant neoplasm of lower lobe, right bronchus or lung: Secondary | ICD-10-CM

## 2015-10-29 DIAGNOSIS — F419 Anxiety disorder, unspecified: Secondary | ICD-10-CM

## 2015-10-29 DIAGNOSIS — D72819 Decreased white blood cell count, unspecified: Secondary | ICD-10-CM | POA: Diagnosis not present

## 2015-10-29 DIAGNOSIS — D649 Anemia, unspecified: Secondary | ICD-10-CM

## 2015-10-29 DIAGNOSIS — C33 Malignant neoplasm of trachea: Secondary | ICD-10-CM

## 2015-10-29 DIAGNOSIS — Z79899 Other long term (current) drug therapy: Secondary | ICD-10-CM

## 2015-10-29 DIAGNOSIS — C3481 Malignant neoplasm of overlapping sites of right bronchus and lung: Secondary | ICD-10-CM

## 2015-10-29 DIAGNOSIS — Z8701 Personal history of pneumonia (recurrent): Secondary | ICD-10-CM

## 2015-10-29 DIAGNOSIS — C3480 Malignant neoplasm of overlapping sites of unspecified bronchus and lung: Principal | ICD-10-CM

## 2015-10-29 DIAGNOSIS — Z87891 Personal history of nicotine dependence: Secondary | ICD-10-CM

## 2015-10-29 DIAGNOSIS — Z8042 Family history of malignant neoplasm of prostate: Secondary | ICD-10-CM

## 2015-10-29 LAB — CBC WITH DIFFERENTIAL/PLATELET
Basophils Absolute: 0 10*3/uL (ref 0–0.1)
Basophils Relative: 1 %
EOS ABS: 0.1 10*3/uL (ref 0–0.7)
EOS PCT: 2 %
HCT: 33.5 % — ABNORMAL LOW (ref 35.0–47.0)
Hemoglobin: 11.8 g/dL — ABNORMAL LOW (ref 12.0–16.0)
LYMPHS ABS: 0.7 10*3/uL — AB (ref 1.0–3.6)
Lymphocytes Relative: 21 %
MCH: 38.1 pg — AB (ref 26.0–34.0)
MCHC: 35.2 g/dL (ref 32.0–36.0)
MCV: 108.2 fL — ABNORMAL HIGH (ref 80.0–100.0)
MONO ABS: 0.5 10*3/uL (ref 0.2–0.9)
Monocytes Relative: 17 %
Neutro Abs: 2 10*3/uL (ref 1.4–6.5)
Neutrophils Relative %: 59 %
PLATELETS: 306 10*3/uL (ref 150–440)
RBC: 3.1 MIL/uL — AB (ref 3.80–5.20)
RDW: 14 % (ref 11.5–14.5)
WBC: 3.3 10*3/uL — AB (ref 3.6–11.0)

## 2015-10-29 LAB — COMPREHENSIVE METABOLIC PANEL
ALT: 48 U/L (ref 14–54)
ANION GAP: 9 (ref 5–15)
AST: 86 U/L — ABNORMAL HIGH (ref 15–41)
Albumin: 4.2 g/dL (ref 3.5–5.0)
Alkaline Phosphatase: 69 U/L (ref 38–126)
BUN: 12 mg/dL (ref 6–20)
CHLORIDE: 106 mmol/L (ref 101–111)
CO2: 22 mmol/L (ref 22–32)
Calcium: 9.2 mg/dL (ref 8.9–10.3)
Creatinine, Ser: 0.74 mg/dL (ref 0.44–1.00)
Glucose, Bld: 98 mg/dL (ref 65–99)
POTASSIUM: 4.2 mmol/L (ref 3.5–5.1)
SODIUM: 137 mmol/L (ref 135–145)
Total Bilirubin: 0.5 mg/dL (ref 0.3–1.2)
Total Protein: 7.4 g/dL (ref 6.5–8.1)

## 2015-10-29 MED ORDER — HEPARIN SOD (PORK) LOCK FLUSH 100 UNIT/ML IV SOLN
500.0000 [IU] | Freq: Once | INTRAVENOUS | Status: AC | PRN
  Administered 2015-10-29: 500 [IU]
  Filled 2015-10-29: qty 5

## 2015-10-29 MED ORDER — PROCHLORPERAZINE MALEATE 10 MG PO TABS
10.0000 mg | ORAL_TABLET | Freq: Once | ORAL | Status: AC
  Administered 2015-10-29: 10 mg via ORAL
  Filled 2015-10-29: qty 1

## 2015-10-29 MED ORDER — FOLIC ACID 1 MG PO TABS: 1.0000 mg | ORAL_TABLET | Freq: Every day | ORAL | 3 refills | Status: DC

## 2015-10-29 MED ORDER — SODIUM CHLORIDE 0.9% FLUSH
10.0000 mL | INTRAVENOUS | Status: DC | PRN
  Administered 2015-10-29: 10 mL
  Filled 2015-10-29: qty 10

## 2015-10-29 MED ORDER — SODIUM CHLORIDE 0.9 % IV SOLN
500.0000 mg/m2 | Freq: Once | INTRAVENOUS | Status: AC
  Administered 2015-10-29: 775 mg via INTRAVENOUS
  Filled 2015-10-29: qty 20

## 2015-10-29 MED ORDER — SODIUM CHLORIDE 0.9 % IV SOLN
Freq: Once | INTRAVENOUS | Status: AC
  Administered 2015-10-29: 11:00:00 via INTRAVENOUS
  Filled 2015-10-29: qty 1000

## 2015-10-29 NOTE — Progress Notes (Signed)
CT scan last Friday

## 2015-10-31 ENCOUNTER — Telehealth: Payer: Self-pay | Admitting: *Deleted

## 2015-10-31 NOTE — Telephone Encounter (Signed)
Folic acid was refilled 10/25, pt informed. She is also asking we update her STD to return to work 11/18 and this needs to be faxed in to company before 11/5. She is going to [facility] on 10/31.

## 2015-11-06 ENCOUNTER — Encounter: Payer: Self-pay | Admitting: *Deleted

## 2015-11-06 NOTE — Telephone Encounter (Signed)
Left msg for patient that her disability letter was written and faxed today.  I asked patient to call our office back to let us know how her apt at the [facility] clinic went yesterday

## 2015-11-18 ENCOUNTER — Other Ambulatory Visit: Payer: Self-pay

## 2015-11-18 DIAGNOSIS — C3431 Malignant neoplasm of lower lobe, right bronchus or lung: Secondary | ICD-10-CM

## 2015-11-19 ENCOUNTER — Inpatient Hospital Stay (HOSPITAL_BASED_OUTPATIENT_CLINIC_OR_DEPARTMENT_OTHER): Payer: BLUE CROSS/BLUE SHIELD | Admitting: Internal Medicine

## 2015-11-19 ENCOUNTER — Inpatient Hospital Stay: Payer: BLUE CROSS/BLUE SHIELD | Attending: Internal Medicine

## 2015-11-19 ENCOUNTER — Inpatient Hospital Stay: Payer: BLUE CROSS/BLUE SHIELD

## 2015-11-19 VITALS — BP 128/80 | HR 61 | Temp 97.2°F | Resp 18 | Wt 127.8 lb

## 2015-11-19 DIAGNOSIS — Z8701 Personal history of pneumonia (recurrent): Secondary | ICD-10-CM | POA: Diagnosis not present

## 2015-11-19 DIAGNOSIS — M791 Myalgia: Secondary | ICD-10-CM

## 2015-11-19 DIAGNOSIS — Z9221 Personal history of antineoplastic chemotherapy: Secondary | ICD-10-CM

## 2015-11-19 DIAGNOSIS — F419 Anxiety disorder, unspecified: Secondary | ICD-10-CM | POA: Diagnosis not present

## 2015-11-19 DIAGNOSIS — Z79899 Other long term (current) drug therapy: Secondary | ICD-10-CM | POA: Insufficient documentation

## 2015-11-19 DIAGNOSIS — C3431 Malignant neoplasm of lower lobe, right bronchus or lung: Secondary | ICD-10-CM | POA: Diagnosis present

## 2015-11-19 DIAGNOSIS — Z87891 Personal history of nicotine dependence: Secondary | ICD-10-CM | POA: Diagnosis not present

## 2015-11-19 DIAGNOSIS — Z8042 Family history of malignant neoplasm of prostate: Secondary | ICD-10-CM

## 2015-11-19 DIAGNOSIS — F329 Major depressive disorder, single episode, unspecified: Secondary | ICD-10-CM | POA: Diagnosis not present

## 2015-11-19 DIAGNOSIS — C7931 Secondary malignant neoplasm of brain: Secondary | ICD-10-CM | POA: Insufficient documentation

## 2015-11-19 DIAGNOSIS — R5383 Other fatigue: Secondary | ICD-10-CM

## 2015-11-19 LAB — CBC WITH DIFFERENTIAL/PLATELET
BASOS ABS: 0 10*3/uL (ref 0–0.1)
Basophils Relative: 1 %
Eosinophils Absolute: 0.1 10*3/uL (ref 0–0.7)
Eosinophils Relative: 2 %
HEMATOCRIT: 33.1 % — AB (ref 35.0–47.0)
Hemoglobin: 11.5 g/dL — ABNORMAL LOW (ref 12.0–16.0)
LYMPHS ABS: 0.7 10*3/uL — AB (ref 1.0–3.6)
LYMPHS PCT: 16 %
MCH: 37.9 pg — AB (ref 26.0–34.0)
MCHC: 34.8 g/dL (ref 32.0–36.0)
MCV: 108.9 fL — AB (ref 80.0–100.0)
MONO ABS: 0.6 10*3/uL (ref 0.2–0.9)
MONOS PCT: 14 %
NEUTROS ABS: 3 10*3/uL (ref 1.4–6.5)
Neutrophils Relative %: 67 %
Platelets: 350 10*3/uL (ref 150–440)
RBC: 3.04 MIL/uL — ABNORMAL LOW (ref 3.80–5.20)
RDW: 14.4 % (ref 11.5–14.5)
WBC: 4.4 10*3/uL (ref 3.6–11.0)

## 2015-11-19 LAB — COMPREHENSIVE METABOLIC PANEL
ALT: 52 U/L (ref 14–54)
AST: 69 U/L — AB (ref 15–41)
Albumin: 4.1 g/dL (ref 3.5–5.0)
Alkaline Phosphatase: 73 U/L (ref 38–126)
Anion gap: 10 (ref 5–15)
BILIRUBIN TOTAL: 0.4 mg/dL (ref 0.3–1.2)
BUN: 22 mg/dL — AB (ref 6–20)
CO2: 21 mmol/L — ABNORMAL LOW (ref 22–32)
Calcium: 9.2 mg/dL (ref 8.9–10.3)
Chloride: 106 mmol/L (ref 101–111)
Creatinine, Ser: 0.71 mg/dL (ref 0.44–1.00)
GFR calc Af Amer: >60 mL/min (ref >60)
Glucose, Bld: 103 mg/dL — ABNORMAL HIGH (ref 65–99)
POTASSIUM: 4.1 mmol/L (ref 3.5–5.1)
Sodium: 137 mmol/L (ref 135–145)
TOTAL PROTEIN: 7.3 g/dL (ref 6.5–8.1)

## 2015-11-19 MED ORDER — HEPARIN SOD (PORK) LOCK FLUSH 100 UNIT/ML IV SOLN
500.0000 [IU] | Freq: Once | INTRAVENOUS | Status: AC
  Administered 2015-11-19: 500 [IU] via INTRAVENOUS

## 2015-11-19 MED ORDER — HEPARIN SOD (PORK) LOCK FLUSH 100 UNIT/ML IV SOLN
INTRAVENOUS | Status: AC
  Filled 2015-11-19: qty 5

## 2015-11-19 NOTE — Progress Notes (Signed)
Patient is here for follow up, she mentions her eyes are still watery from procedure Friday. She also has some headaches.

## 2015-11-19 NOTE — Assessment & Plan Note (Signed)
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 Currently on maintenance Alimta tolerating well.  # HOLD Chemo today. Await records from [facility] clinic.   # Brain mets- s/p GK x3 lesions in [facility] clinic [ Dr.Pollock]  # follow up in 3 week/s chemo/labs/possible Alimta.

## 2015-11-19 NOTE — Progress Notes (Signed)
[place] OFFICE PROGRESS NOTE  Patient Care Team: Mar Daring, PA-C as PCP - General (Family Medicine) Allyne Gee, MD as Referring Physician (Internal Medicine)  Malignant neoplasm of trachea, bronchus, and lung [facility])   Staging form: Lung, AJCC 7th Edition     Clinical: Stage IIIB (T4, N2, M0) - Signed by Cammie Sickle, MD on 06/24/2015    Oncology History   # FEB 2017-[place] CA Right Lower Lung T4N2M0- STAGE IIIB [Bronch & Subcarinal LNBx]- March 8th START CARBO-ALIMTA x4 cycles- June 8th 2017 - IMPROVED FDG MULTI-FOCAL lesions/N2-LN activity. S/p [place]-- Alimta x6  # AUG 18th- Alimta- Avastin maintenance; SEP 13th DISCONTINUE AVASTIN [sec to cavitary lesion]; OCT 20th 2017- CT-STABLE Disease  # II opinion at [facility] clinic [Dr.Leventakos; [phone]/GYKZ] s/p RT/ GK x3 lesions [11/14/2015]  MOLECULAR STUDIES:  KRAS MUTATED/Tissues not sufficient for OTHER the molecular marker; will need repeat Bx      Malignant neoplasm of trachea, bronchus, and lung ([place])   06/24/2015 Initial Diagnosis    Malignant neoplasm of trachea, bronchus, and lung (HCC)       Malignant neoplasm of lower lobe, right bronchus or lung   07/23/2015 Initial Diagnosis    Malignant neoplasm of lower lobe, right bronchus or lung (HCC)       Primary cancer of right lower lobe of lung ([place])   10/08/2015 Initial Diagnosis    Primary cancer of right lower lobe of lung ([place])       INTERVAL HISTORY:  Deanna Blair 60 y.o.  female pleasant patient above history of T4 N2 adenocarcinoma the lung currently on chemotherapy with carboplatin and Alimta 6 cycles. Patient is status post Second maintenance Alimta approximately 3 weeks ago.  Patient complains of significant fatigue. She also complains of body aches muscle aches after the chemotherapy. This is interrupting her daily lifestyle.   No weight loss. No nausea no vomiting. No chest pain or shortness of breath or cough.  No hemoptysis. No headaches.  REVIEW OF SYSTEMS:  A complete 10 point review of system is done which is negative except mentioned above/history of present illness.   PAST MEDICAL HISTORY :  Past Medical History:  Diagnosis Date  . Anxiety   . Cancer of right lung ([place]) 02/13/2015  . Depression   . Depression with anxiety    Well controlled with Wellbutrin and Buspar  . Pneumonia     PAST SURGICAL HISTORY :   Past Surgical History:  Procedure Laterality Date  . ECTOPIC PREGNANCY SURGERY  1988  . ELECTROMAGNETIC NAVIGATION BROCHOSCOPY N/A 02/10/2015   Procedure: ELECTROMAGNETIC NAVIGATION BRONCHOSCOPY;  Surgeon: Flora Lipps, MD;  Location: [facility] ORS;  Service: Cardiopulmonary;  Laterality: N/A;  . ENDOBRONCHIAL ULTRASOUND N/A 02/10/2015   Procedure: ENDOBRONCHIAL ULTRASOUND;  Surgeon: Flora Lipps, MD;  Location: [facility] ORS;  Service: Cardiopulmonary;  Laterality: N/A;  . PERIPHERAL VASCULAR CATHETERIZATION N/A 03/05/2015   Procedure: Glori Luis Cath Insertion;  Surgeon: Katha Cabal, MD;  Location: [place] [facility] LAB;  Service: Cardiovascular;  Laterality: N/A;    FAMILY HISTORY :   Family History  Problem Relation Age of Onset  . Cancer Father     Prostate Cancer  . Hypertension Father   . Stroke Father   . Hypertension Brother     SOCIAL HISTORY:   Social History  Substance Use Topics  . Smoking status: Former Smoker    Packs/day: 0.25    Years: 30.00    Types: Cigarettes  Quit date: 02/06/2005  . Smokeless tobacco: Never Used  . Alcohol use 3.0 oz/week    5 Cans of beer per week     Comment: 5 beers weekly    ALLERGIES:  has No Known Allergies.  MEDICATIONS:  Current Outpatient Prescriptions  Medication Sig Dispense Refill  . busPIRone (BUSPAR) 5 MG tablet TAKE ONE TABLET BY MOUTH TWICE DAILY 60 tablet 6  . dexamethasone (DECADRON) 4 MG tablet Take 1 tablet by mouth at 8am and 5pm on day 2 and day 3 after chemotherapy. 30 tablet 3  . folic acid (FOLVITE) 1 MG tablet  Take 1 tablet (1 mg total) by mouth daily. 30 tablet 3  . lidocaine-prilocaine (EMLA) cream Apply 1 application topically as needed. 30 g 2  . SPIRIVA RESPIMAT 1.25 MCG/ACT AERS     . SYMBICORT 80-4.5 MCG/ACT inhaler     . terconazole (TERAZOL 7) 0.4 % vaginal cream Place 1 applicator vaginally at bedtime. X 7 days (Patient not taking: Reported on 11/19/2015) 45 g 0  . zolpidem (AMBIEN) 5 MG tablet Take 1 tablet (5 mg total) by mouth at bedtime as needed for sleep. (Patient not taking: Reported on 11/19/2015) 30 tablet 5   No current facility-administered medications for this visit.     PHYSICAL EXAMINATION: ECOG PERFORMANCE STATUS: 0 - Asymptomatic  BP 128/80 (BP Location: Left Arm, Patient Position: Sitting)   Pulse 61   Temp 97.2 F (36.2 C) (Tympanic)   Resp 18   Wt 127 lb 12.8 oz (58 kg)   LMP 06/16/2000 (Approximate) Comment: 15 yrs ago  BMI 23.37 kg/m   Filed Weights   11/19/15 0937  Weight: 127 lb 12.8 oz (58 kg)    GENERAL: Well-nourished well-developed; Alert, no distress and comfortable.   She is alone. EYES: no pallor or icterus OROPHARYNX: no thrush or ulceration; good dentition  NECK: supple, no masses felt LYMPH:  no palpable lymphadenopathy in the cervical, axillary or inguinal regions LUNGS: clear to auscultation and  No wheeze or crackles HEART/CVS: regular rate & rhythm and no murmurs; No lower extremity edema ABDOMEN:abdomen soft, non-tender and normal bowel sounds Musculoskeletal:no cyanosis of digits and no clubbing  PSYCH: alert & oriented x 3 with fluent speech NEURO: no focal motor/sensory deficits SKIN:  no rashes or significant lesions  LABORATORY DATA:  I have reviewed the data as listed    Component Value Date/Time   NA 137 11/19/2015 0918   NA 139 01/03/2015 0910   K 4.1 11/19/2015 0918   CL 106 11/19/2015 0918   CO2 21 (L) 11/19/2015 0918   GLUCOSE 103 (H) 11/19/2015 0918   BUN 22 (H) 11/19/2015 0918   BUN 10 01/03/2015 0910    CREATININE 0.71 11/19/2015 0918   CALCIUM 9.2 11/19/2015 0918   PROT 7.3 11/19/2015 0918   PROT 6.5 01/03/2015 0910   ALBUMIN 4.1 11/19/2015 0918   ALBUMIN 3.8 01/03/2015 0910   AST 69 (H) 11/19/2015 0918   ALT 52 11/19/2015 0918   ALKPHOS 73 11/19/2015 0918   BILITOT 0.4 11/19/2015 0918   BILITOT <0.2 01/03/2015 0910   GFRNONAA >60 11/19/2015 0918   GFRAA >60 11/19/2015 0918    No results found for: SPEP, UPEP  Lab Results  Component Value Date   WBC 4.4 11/19/2015   NEUTROABS 3.0 11/19/2015   HGB 11.5 (L) 11/19/2015   HCT 33.1 (L) 11/19/2015   MCV 108.9 (H) 11/19/2015   PLT 350 11/19/2015      Chemistry  Component Value Date/Time   NA 137 11/19/2015 0918   NA 139 01/03/2015 0910   K 4.1 11/19/2015 0918   CL 106 11/19/2015 0918   CO2 21 (L) 11/19/2015 0918   BUN 22 (H) 11/19/2015 0918   BUN 10 01/03/2015 0910   CREATININE 0.71 11/19/2015 0918      Component Value Date/Time   CALCIUM 9.2 11/19/2015 0918   ALKPHOS 73 11/19/2015 0918   AST 69 (H) 11/19/2015 0918   ALT 52 11/19/2015 0918   BILITOT 0.4 11/19/2015 0918   BILITOT <0.2 01/03/2015 0910     IMPRESSION: 1. Multifocal right lung adenocarcinoma is stable in size and extent. Increased cavitation within the dominant subsolid right lower lobe lung mass likely represents treatment effect. 2. Mild AP window and right hilar adenopathy is stable. 3. No new or progressive metastatic disease in the chest. 4. Additional findings include aortic atherosclerosis and mild emphysema.   Electronically Signed   By: Ilona Sorrel M.D.   On: 10/24/2015 09:39  RADIOGRAPHIC STUDIES: I have personally reviewed the radiological images as listed and agreed with the findings in the report. No results found.   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 Currently on maintenance Alimta tolerating  well.  # HOLD Chemo today. Await records from [facility] clinic.   # Brain mets- s/p GK x3 lesions in [facility] clinic [ Dr.Pollock]  # follow up in 3 week/s chemo/labs/possible Alimta.   Orders Placed This Encounter  Procedures  . CBC with Differential    Standing Status:   Future    Standing Expiration Date:   11/18/2016  . Comprehensive metabolic panel    Standing Status:   Future    Standing Expiration Date:   11/18/2016     Cammie Sickle, MD 11/19/2015 4:02 PM

## 2015-12-10 ENCOUNTER — Inpatient Hospital Stay: Payer: BLUE CROSS/BLUE SHIELD

## 2015-12-10 ENCOUNTER — Inpatient Hospital Stay (HOSPITAL_BASED_OUTPATIENT_CLINIC_OR_DEPARTMENT_OTHER): Payer: BLUE CROSS/BLUE SHIELD | Admitting: Internal Medicine

## 2015-12-10 ENCOUNTER — Inpatient Hospital Stay: Payer: BLUE CROSS/BLUE SHIELD | Attending: Internal Medicine

## 2015-12-10 VITALS — BP 124/83 | HR 91 | Temp 97.8°F | Wt 127.0 lb

## 2015-12-10 DIAGNOSIS — Z8701 Personal history of pneumonia (recurrent): Secondary | ICD-10-CM | POA: Insufficient documentation

## 2015-12-10 DIAGNOSIS — Z79899 Other long term (current) drug therapy: Secondary | ICD-10-CM | POA: Insufficient documentation

## 2015-12-10 DIAGNOSIS — Z5111 Encounter for antineoplastic chemotherapy: Secondary | ICD-10-CM | POA: Diagnosis not present

## 2015-12-10 DIAGNOSIS — C7931 Secondary malignant neoplasm of brain: Secondary | ICD-10-CM | POA: Diagnosis not present

## 2015-12-10 DIAGNOSIS — C3431 Malignant neoplasm of lower lobe, right bronchus or lung: Secondary | ICD-10-CM

## 2015-12-10 DIAGNOSIS — Z8042 Family history of malignant neoplasm of prostate: Secondary | ICD-10-CM | POA: Diagnosis not present

## 2015-12-10 DIAGNOSIS — Z87891 Personal history of nicotine dependence: Secondary | ICD-10-CM | POA: Insufficient documentation

## 2015-12-10 DIAGNOSIS — F329 Major depressive disorder, single episode, unspecified: Secondary | ICD-10-CM

## 2015-12-10 DIAGNOSIS — F419 Anxiety disorder, unspecified: Secondary | ICD-10-CM

## 2015-12-10 LAB — COMPREHENSIVE METABOLIC PANEL
ALBUMIN: 4.5 g/dL (ref 3.5–5.0)
ALK PHOS: 65 U/L (ref 38–126)
ALT: 31 U/L (ref 14–54)
ANION GAP: 11 (ref 5–15)
AST: 41 U/L (ref 15–41)
BILIRUBIN TOTAL: 0.9 mg/dL (ref 0.3–1.2)
BUN: 23 mg/dL — AB (ref 6–20)
CO2: 23 mmol/L (ref 22–32)
Calcium: 9.1 mg/dL (ref 8.9–10.3)
Chloride: 100 mmol/L — ABNORMAL LOW (ref 101–111)
Creatinine, Ser: 0.96 mg/dL (ref 0.44–1.00)
GFR calc Af Amer: >60 mL/min (ref >60)
GFR calc non Af Amer: >60 mL/min (ref >60)
GLUCOSE: 168 mg/dL — AB (ref 65–99)
POTASSIUM: 4.2 mmol/L (ref 3.5–5.1)
SODIUM: 134 mmol/L — AB (ref 135–145)
TOTAL PROTEIN: 7.8 g/dL (ref 6.5–8.1)

## 2015-12-10 LAB — CBC WITH DIFFERENTIAL/PLATELET
BASOS ABS: 0 10*3/uL (ref 0–0.1)
BASOS PCT: 1 %
EOS ABS: 0.1 10*3/uL (ref 0–0.7)
Eosinophils Relative: 2 %
HEMATOCRIT: 39.6 % (ref 35.0–47.0)
HEMOGLOBIN: 14 g/dL (ref 12.0–16.0)
Lymphocytes Relative: 20 %
Lymphs Abs: 1.3 10*3/uL (ref 1.0–3.6)
MCH: 37.8 pg — ABNORMAL HIGH (ref 26.0–34.0)
MCHC: 35.3 g/dL (ref 32.0–36.0)
MCV: 107.3 fL — ABNORMAL HIGH (ref 80.0–100.0)
Monocytes Absolute: 0.8 10*3/uL (ref 0.2–0.9)
Monocytes Relative: 12 %
NEUTROS ABS: 4.3 10*3/uL (ref 1.4–6.5)
NEUTROS PCT: 65 %
Platelets: 265 10*3/uL (ref 150–440)
RBC: 3.69 MIL/uL — ABNORMAL LOW (ref 3.80–5.20)
RDW: 13.6 % (ref 11.5–14.5)
WBC: 6.6 10*3/uL (ref 3.6–11.0)

## 2015-12-10 MED ORDER — SODIUM CHLORIDE 0.9% FLUSH
10.0000 mL | INTRAVENOUS | Status: DC | PRN
  Administered 2015-12-10: 10 mL
  Filled 2015-12-10: qty 10

## 2015-12-10 MED ORDER — PROCHLORPERAZINE MALEATE 10 MG PO TABS
10.0000 mg | ORAL_TABLET | Freq: Once | ORAL | Status: AC
  Administered 2015-12-10: 10 mg via ORAL
  Filled 2015-12-10: qty 1

## 2015-12-10 MED ORDER — HEPARIN SOD (PORK) LOCK FLUSH 100 UNIT/ML IV SOLN
500.0000 [IU] | Freq: Once | INTRAVENOUS | Status: AC | PRN
  Administered 2015-12-10: 500 [IU]
  Filled 2015-12-10: qty 5

## 2015-12-10 MED ORDER — SODIUM CHLORIDE 0.9 % IV SOLN
Freq: Once | INTRAVENOUS | Status: AC
  Administered 2015-12-10: 11:00:00 via INTRAVENOUS
  Filled 2015-12-10: qty 1000

## 2015-12-10 MED ORDER — SODIUM CHLORIDE 0.9 % IV SOLN
500.0000 mg/m2 | Freq: Once | INTRAVENOUS | Status: AC
  Administered 2015-12-10: 775 mg via INTRAVENOUS
  Filled 2015-12-10: qty 20

## 2015-12-10 NOTE — Assessment & Plan Note (Signed)
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 Currently on maintenance Alimta tolerating well. Reviewed extensively the records from [facility].  # resume maintenance Alimta today; labs okay. Will get CT scan after end of Jan/ early Feb 2017.   # Brain mets- s/p GK x2 lesions in [facility] clinic [ Dr.Pollock]; will get MRI brain Mid Feb 2017.   # follow up in 4 week/s chemo/labs/ Alimta [holidays].

## 2015-12-10 NOTE — Progress Notes (Signed)
Patient here today for follow up.  No new concerns

## 2015-12-10 NOTE — Progress Notes (Signed)
[place] OFFICE PROGRESS NOTE  Patient Care Team: Mar Daring, PA-C as PCP - General (Family Medicine) Allyne Gee, MD as Referring Physician (Internal Medicine)  Malignant neoplasm of trachea, bronchus, and lung [facility])   Staging form: Lung, AJCC 7th Edition     Clinical: Stage IIIB (T4, N2, M0) - Signed by Cammie Sickle, MD on 06/24/2015    Oncology History   # FEB 2017-[place] Right Lower Lung T4N2M0- STAGE IIIB [Bronch & Subcarinal LNBx]- March 8th START CARBO-ALIMTA x4 cycles- June 8th 2017 - IMPROVED FDG MULTI-FOCAL lesions/N2-LN activity. S/p [place]-- Alimta x6  # AUG 18th- Alimta- Avastin maintenance; SEP 13th DISCONTINUE AVASTIN [sec to cavitary lesion]; OCT 20th 2017- CT-STABLE Disease; NOV 8th CT/PET [facility] clinic]- "overall slight progression-? Lymphangiectatic spread"  # NOV 2nd 2017-MRI, [facility]- Brain mets [asymptomatic;Right frontal ~24m; ~342mlesions -appx 3-4 lesions; s/p GK x 3 lesions]   # [facility] [Dr.Leventakos; [phone]/XKGY] MOLECULAR STUDIES:  KRAS MUTATED/Tissues not sufficient for OTHER the molecular marker; will need repeat Bx ; GUARDIANT TESTING [[facility]]- pending.      Malignant neoplasm of trachea, bronchus, and lung ([place]  06/24/2015 Initial Diagnosis    Malignant neoplasm of trachea, bronchus, and lung (HCC)       Malignant neoplasm of lower lobe, right bronchus or lung   07/23/2015 Initial Diagnosis    Malignant neoplasm of lower lobe, right bronchus or lung (HCC)       Primary [place] of [place] of [place]  10/08/2015 Initial Diagnosis    Primary cancer of right lower lobe of lung ([facility]      INTERVAL HISTORY:  [name]19.o.  female pleasant patient above history of T4 N2 adenocarcinoma the lung currently on chemotherapy with carboplatin and Alimta 6 cycles. Patient is status post Second maintenance Alimta approximately 6 Weeks ago.  [facility] showed multiple brain lesions. Status postradiation to 2 lesions. Fatigue improved. No nausea no vomiting. No headaches.   No weight loss. No nausea no vomiting. No chest pain or shortness of breath or cough. No hemoptysis. No headaches.  REVIEW OF SYSTEMS:  A complete 10 point review of system is done which is negative except mentioned above/history of present illness.   PAST MEDICAL HISTORY :  Past Medical History:  Diagnosis Date  . Anxiety   . [place]  . Depression   . Depression with anxiety    Well controlled with Wellbutrin and Buspar  . Pneumonia     PAST SURGICAL HISTORY :   Past Surgical History:  Procedure Laterality Date  . ECTOPIC PREGNANCY SURGERY  1988  . ELECTROMAGNETIC NAVIGATION BROCHOSCOPY N/A 02/10/2015   Procedure: ELECTROMAGNETIC NAVIGATION BRONCHOSCOPY;  Surgeon: KuFlora LippsMD;  Location: [facility] ORS;  Service: Cardiopulmonary;  Laterality: N/A;  . ENDOBRONCHIAL ULTRASOUND N/A 02/10/2015   Procedure: ENDOBRONCHIAL ULTRASOUND;  Surgeon: KuFlora LippsMD;  Location: [facility] ORS;  Service: Cardiopulmonary;  Laterality: N/A;  . PERIPHERAL VASCULAR CATHETERIZATION N/A 03/05/2015   Procedure: PoGlori Luisath Insertion;  Surgeon: GrKatha CabalMD;  [facility];  Service: Cardiovascular;  Laterality: N/A;    FAMILY HISTORY :   Family History  Problem Relation Age of Onset  . Cancer Father     Prostate Cancer  . Hypertension Father   . Stroke Father   .  Hypertension Brother     SOCIAL HISTORY:   Social History  Substance Use Topics  . Smoking status: Former Smoker    Packs/day: 0.25    Years: 30.00    Types: Cigarettes    Quit date: 02/06/2005  . Smokeless tobacco: Never Used  . Alcohol use 3.0 oz/week    5 Cans of beer per week     Comment: 5 beers weekly    ALLERGIES:  has No Known Allergies.  MEDICATIONS:  Current Outpatient Prescriptions  Medication Sig  Dispense Refill  . busPIRone (BUSPAR) 5 MG tablet TAKE ONE TABLET BY MOUTH TWICE DAILY 60 tablet 6  . dexamethasone (DECADRON) 4 MG tablet Take 1 tablet by mouth at 8am and 5pm on day 2 and day 3 after chemotherapy. 30 tablet 3  . folic acid (FOLVITE) 1 MG tablet Take 1 tablet (1 mg total) by mouth daily. 30 tablet 3  . lidocaine-prilocaine (EMLA) cream Apply 1 application topically as needed. 30 g 2  . SPIRIVA RESPIMAT 1.25 MCG/ACT AERS     . SYMBICORT 80-4.5 MCG/ACT inhaler     . zolpidem (AMBIEN) 5 MG tablet Take 1 tablet (5 mg total) by mouth at bedtime as needed for sleep. 30 tablet 5   No current facility-administered medications for this visit.     PHYSICAL EXAMINATION: ECOG PERFORMANCE STATUS: 0 - Asymptomatic  BP 124/83 (BP Location: Right Arm, Patient Position: Sitting)   Pulse 91   Temp 97.8 F (36.6 C) (Tympanic)   Wt 127 lb (57.6 kg)   LMP 06/16/2000 (Approximate) Comment: 15 yrs ago  BMI 23.23 kg/m   Filed Weights   12/10/15 1040  Weight: 127 lb (57.6 kg)    GENERAL: Well-nourished well-developed; Alert, no distress and comfortable.   She is alone. EYES: no pallor or icterus OROPHARYNX: no thrush or ulceration; good dentition  NECK: supple, no masses felt LYMPH:  no palpable lymphadenopathy in the cervical, axillary or inguinal regions LUNGS: clear to auscultation and  No wheeze or crackles HEART/CVS: regular rate & rhythm and no murmurs; No lower extremity edema ABDOMEN:abdomen soft, non-tender and normal bowel sounds Musculoskeletal:no cyanosis of digits and no clubbing  PSYCH: alert & oriented x 3 with fluent speech NEURO: no focal motor/sensory deficits SKIN:  no rashes or significant lesions  LABORATORY DATA:  I have reviewed the data as listed    Component Value Date/Time   NA 134 (L) 12/10/2015 1009   NA 139 01/03/2015 0910   K 4.2 12/10/2015 1009   CL 100 (L) 12/10/2015 1009   CO2 23 12/10/2015 1009   GLUCOSE 168 (H) 12/10/2015 1009   BUN 23  (H) 12/10/2015 1009   BUN 10 01/03/2015 0910   CREATININE 0.96 12/10/2015 1009   CALCIUM 9.1 12/10/2015 1009   PROT 7.8 12/10/2015 1009   PROT 6.5 01/03/2015 0910   ALBUMIN 4.5 12/10/2015 1009   ALBUMIN 3.8 01/03/2015 0910   AST 41 12/10/2015 1009   ALT 31 12/10/2015 1009   ALKPHOS 65 12/10/2015 1009   BILITOT 0.9 12/10/2015 1009   BILITOT <0.2 01/03/2015 0910   GFRNONAA >60 12/10/2015 1009   GFRAA >60 12/10/2015 1009    No results found for: SPEP, UPEP  Lab Results  Component Value Date   WBC 6.6 12/10/2015   NEUTROABS 4.3 12/10/2015   HGB 14.0 12/10/2015   HCT 39.6 12/10/2015   MCV 107.3 (H) 12/10/2015   PLT 265 12/10/2015      Chemistry  Component Value Date/Time   NA 134 (L) 12/10/2015 1009   NA 139 01/03/2015 0910   K 4.2 12/10/2015 1009   CL 100 (L) 12/10/2015 1009   CO2 23 12/10/2015 1009   BUN 23 (H) 12/10/2015 1009   BUN 10 01/03/2015 0910   CREATININE 0.96 12/10/2015 1009      Component Value Date/Time   CALCIUM 9.1 12/10/2015 1009   ALKPHOS 65 12/10/2015 1009   AST 41 12/10/2015 1009   ALT 31 12/10/2015 1009   BILITOT 0.9 12/10/2015 1009   BILITOT <0.2 01/03/2015 0910       RADIOGRAPHIC STUDIES: I have personally reviewed the radiological images as listed and agreed with the findings in the report. No results found.   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 Currently on maintenance Alimta tolerating well. Reviewed extensively [facility].  # resume maintenance Alimta today; labs okay. Will get CT scan after end of Jan/ early Feb 2017.   # Brain mets- s/p GK x2 lesions in [facility] clinic [ Dr.Pollock]; will get MRI brain Mid Feb 2017.   # follow up in 4 week/s chemo/labs/ Alimta [holidays].  Orders Placed This Encounter  Procedures  . CBC with Differential/Platelet    Standing Status:   Future     Standing Expiration Date:   12/09/2016  . Comprehensive metabolic panel    Standing Status:   Future    Standing Expiration Date:   12/09/2016     Cammie Sickle, MD 12/11/2015 2:21 PM

## 2016-01-07 ENCOUNTER — Inpatient Hospital Stay: Payer: BLUE CROSS/BLUE SHIELD | Attending: Internal Medicine | Admitting: Internal Medicine

## 2016-01-07 ENCOUNTER — Inpatient Hospital Stay: Payer: BLUE CROSS/BLUE SHIELD

## 2016-01-07 VITALS — BP 146/73 | HR 61 | Temp 97.9°F | Ht 62.0 in | Wt 128.0 lb

## 2016-01-07 DIAGNOSIS — Z5111 Encounter for antineoplastic chemotherapy: Secondary | ICD-10-CM | POA: Insufficient documentation

## 2016-01-07 DIAGNOSIS — Z8701 Personal history of pneumonia (recurrent): Secondary | ICD-10-CM | POA: Diagnosis not present

## 2016-01-07 DIAGNOSIS — C7931 Secondary malignant neoplasm of brain: Secondary | ICD-10-CM

## 2016-01-07 DIAGNOSIS — R0789 Other chest pain: Secondary | ICD-10-CM | POA: Diagnosis not present

## 2016-01-07 DIAGNOSIS — H579 Unspecified disorder of eye and adnexa: Secondary | ICD-10-CM

## 2016-01-07 DIAGNOSIS — Z5112 Encounter for antineoplastic immunotherapy: Secondary | ICD-10-CM | POA: Diagnosis not present

## 2016-01-07 DIAGNOSIS — Z8041 Family history of malignant neoplasm of ovary: Secondary | ICD-10-CM | POA: Diagnosis not present

## 2016-01-07 DIAGNOSIS — F329 Major depressive disorder, single episode, unspecified: Secondary | ICD-10-CM

## 2016-01-07 DIAGNOSIS — Z79899 Other long term (current) drug therapy: Secondary | ICD-10-CM | POA: Diagnosis not present

## 2016-01-07 DIAGNOSIS — C3431 Malignant neoplasm of lower lobe, right bronchus or lung: Secondary | ICD-10-CM

## 2016-01-07 DIAGNOSIS — F419 Anxiety disorder, unspecified: Secondary | ICD-10-CM | POA: Diagnosis not present

## 2016-01-07 DIAGNOSIS — Z87891 Personal history of nicotine dependence: Secondary | ICD-10-CM | POA: Diagnosis not present

## 2016-01-07 LAB — CBC WITH DIFFERENTIAL/PLATELET
BASOS ABS: 0 10*3/uL (ref 0–0.1)
BASOS PCT: 1 %
Eosinophils Absolute: 0.1 10*3/uL (ref 0–0.7)
Eosinophils Relative: 1 %
HEMATOCRIT: 34.8 % — AB (ref 35.0–47.0)
Hemoglobin: 12 g/dL (ref 12.0–16.0)
LYMPHS PCT: 22 %
Lymphs Abs: 0.9 10*3/uL — ABNORMAL LOW (ref 1.0–3.6)
MCH: 37 pg — ABNORMAL HIGH (ref 26.0–34.0)
MCHC: 34.4 g/dL (ref 32.0–36.0)
MCV: 107.3 fL — ABNORMAL HIGH (ref 80.0–100.0)
MONO ABS: 0.6 10*3/uL (ref 0.2–0.9)
Monocytes Relative: 16 %
NEUTROS ABS: 2.4 10*3/uL (ref 1.4–6.5)
Neutrophils Relative %: 60 %
Platelets: 202 10*3/uL (ref 150–440)
RBC: 3.24 MIL/uL — ABNORMAL LOW (ref 3.80–5.20)
RDW: 13.5 % (ref 11.5–14.5)
WBC: 4 10*3/uL (ref 3.6–11.0)

## 2016-01-07 LAB — COMPREHENSIVE METABOLIC PANEL
ALBUMIN: 4.1 g/dL (ref 3.5–5.0)
ALT: 57 U/L — AB (ref 14–54)
AST: 86 U/L — AB (ref 15–41)
Alkaline Phosphatase: 63 U/L (ref 38–126)
Anion gap: 8 (ref 5–15)
BILIRUBIN TOTAL: 0.6 mg/dL (ref 0.3–1.2)
BUN: 12 mg/dL (ref 6–20)
CHLORIDE: 107 mmol/L (ref 101–111)
CO2: 22 mmol/L (ref 22–32)
CREATININE: 0.71 mg/dL (ref 0.44–1.00)
Calcium: 9.2 mg/dL (ref 8.9–10.3)
GFR calc Af Amer: >60 mL/min (ref >60)
GFR calc non Af Amer: >60 mL/min (ref >60)
GLUCOSE: 100 mg/dL — AB (ref 65–99)
POTASSIUM: 3.6 mmol/L (ref 3.5–5.1)
Sodium: 137 mmol/L (ref 135–145)
TOTAL PROTEIN: 7.2 g/dL (ref 6.5–8.1)

## 2016-01-07 MED ORDER — HEPARIN SOD (PORK) LOCK FLUSH 100 UNIT/ML IV SOLN
500.0000 [IU] | Freq: Once | INTRAVENOUS | Status: AC
  Administered 2016-01-07: 500 [IU] via INTRAVENOUS
  Filled 2016-01-07: qty 5

## 2016-01-07 MED ORDER — SODIUM CHLORIDE 0.9% FLUSH
10.0000 mL | INTRAVENOUS | Status: DC | PRN
Start: 2016-01-07 — End: 2016-01-07
  Administered 2016-01-07: 10 mL via INTRAVENOUS
  Filled 2016-01-07: qty 10

## 2016-01-07 MED ORDER — SODIUM CHLORIDE 0.9 % IV SOLN
500.0000 mg/m2 | Freq: Once | INTRAVENOUS | Status: AC
  Administered 2016-01-07: 775 mg via INTRAVENOUS
  Filled 2016-01-07: qty 11

## 2016-01-07 MED ORDER — SODIUM CHLORIDE 0.9 % IV SOLN
Freq: Once | INTRAVENOUS | Status: AC
  Administered 2016-01-07: 15:00:00 via INTRAVENOUS
  Filled 2016-01-07: qty 1000

## 2016-01-07 MED ORDER — CYANOCOBALAMIN 1000 MCG/ML IJ SOLN
1000.0000 ug | Freq: Once | INTRAMUSCULAR | Status: AC
  Administered 2016-01-07: 1000 ug via INTRAMUSCULAR
  Filled 2016-01-07: qty 1

## 2016-01-07 MED ORDER — PROCHLORPERAZINE MALEATE 10 MG PO TABS
10.0000 mg | ORAL_TABLET | Freq: Once | ORAL | Status: AC
  Administered 2016-01-07: 10 mg via ORAL
  Filled 2016-01-07: qty 1

## 2016-01-07 NOTE — Progress Notes (Signed)
[place] OFFICE PROGRESS NOTE  Patient Care Team: Mar Daring, PA-C as PCP - General (Family Medicine) Allyne Gee, MD as Referring Physician (Internal Medicine)  Malignant neoplasm of trachea, bronchus, and lung [facility])   Staging form: Lung, AJCC 7th Edition     Clinical: Stage IIIB (T4, N2, M0) - Signed by Cammie Sickle, MD on 06/24/2015    Oncology History   # FEB 2017-[place] CA Right Lower Lung T4N2M0- STAGE IIIB [Bronch & Subcarinal LNBx]- March 8th START CARBO-ALIMTA x4 cycles- June 8th 2017 - IMPROVED FDG MULTI-FOCAL lesions/N2-LN activity. S/p [place]-- Alimta x6  # AUG 18th- Alimta- Avastin maintenance; SEP 13th DISCONTINUE AVASTIN [sec to cavitary lesion]; OCT 20th 2017- CT-STABLE Disease; NOV 8th CT/PET [facility] clinic]- "overall slight progression-? Lymphangiectatic spread"  # NOV 2nd 2017-MRI, [facility]- Brain mets [asymptomatic;Right frontal ~56m; ~333mlesions -appx 3-4 lesions; s/p GK x 3 lesions]   # [facility] [Dr.Leventakos; [phone]/[facility]] MOLECULAR STUDIES:  KRAS MUTATED/Tissues not sufficient for OTHER the molecular marker; will need repeat Bx ; GUARDIANT TESTING [[facility]]- pending.      Malignant neoplasm of trachea, bronchus, and lung ([facility]  06/24/2015 Initial Diagnosis    Malignant neoplasm of trachea, bronchus, and lung (HCC)       Malignant neoplasm of lower lobe, right bronchus or lung   07/23/2015 Initial Diagnosis    Malignant neoplasm of lower lobe, right bronchus or lung (HCC)       Primary cancer of right [place] lobe of [place] ([facility]  10/08/2015 Initial Diagnosis    Primary cancer of right [place] lobe of lung ([facility]      INTERVAL HISTORY:  WaDayna Blair.o.  female pleasant patient above history of T4 N2 adenocarcinoma the lung currently on Maintenance Alimta is here for follow-up.  Patient noted to have intermittent pain in her right posterior chest wall; below the scapula; and scapular region  over the last few weeks. She has needed to take Tylenol to help with the pain.  Fatigue improved. No nausea no vomiting. No headaches.  No weight loss. No nausea no vomiting. No chest pain or shortness of breath or cough. No hemoptysis. No headaches.  REVIEW OF SYSTEMS:  A complete 10 point review of system is done which is negative except mentioned above/history of present illness.   PAST MEDICAL HISTORY :  Past Medical History:  Diagnosis Date  . Anxiety   . Cancer of right lung ([facility] [place]/09[place]2015  . Depression   . Depression with anxiety    Well controlled with Wellbutrin and Buspar  . Pneumonia     PAST SURGICAL HISTORY :   Past Surgical History:  Procedure Laterality Date  . ECTOPIC PREGNANCY SURGERY  1988  . ELECTROMAGNETIC NAVIGATION BROCHOSCOPY N/A 02/10/2015   Procedure: ELECTROMAGNETIC NAVIGATION BRONCHOSCOPY;  Surgeon: KuFlora LippsMD;  Location: [facility] ORS;  Service: Cardiopulmonary;  Laterality: N/A;  . ENDOBRONCHIAL ULTRASOUND N/A 02/10/2015   Procedure: ENDOBRONCHIAL ULTRASOUND;  Surgeon: KuFlora LippsMD;  Location: [facility] ORS;  Service: Cardiopulmonary;  Laterality: N/A;  . PERIPHERAL VASCULAR CATHETERIZATION N/A 03/05/2015   Procedure: PoGlori Luisath Insertion;  Surgeon: GrKatha CabalMD;  [place] [facility];  Service: Cardiovascular;  Laterality: N/A;    FAMILY HISTORY :   Family History  Problem Relation Age of Onset  . Cancer Father     Prostate Cancer  . Hypertension Father   . Stroke Father   .  Hypertension Brother     SOCIAL HISTORY:   Social History  Substance Use Topics  . Smoking status: Former Smoker    Packs/day: 0.25    Years: 30.00    Types: Cigarettes    Quit date: 02/06/2005  . Smokeless tobacco: Never Used  . Alcohol use 3.0 oz/week    5 Cans of beer per week     Comment: 5 beers weekly    ALLERGIES:  has No Known Allergies.  MEDICATIONS:  Current Outpatient Prescriptions  Medication Sig Dispense Refill  . busPIRone (BUSPAR) 5 MG  tablet TAKE ONE TABLET BY MOUTH TWICE DAILY 60 tablet 6  . dexamethasone (DECADRON) 4 MG tablet Take 1 tablet by mouth at 8am and 5pm on day 2 and day 3 after chemotherapy. 30 tablet 3  . folic acid (FOLVITE) 1 MG tablet Take 1 tablet (1 mg total) by mouth daily. 30 tablet 3  . lidocaine-prilocaine (EMLA) cream Apply 1 application topically as needed. 30 g 2  . SPIRIVA RESPIMAT 1.25 MCG/ACT AERS     . SYMBICORT 80-4.5 MCG/ACT inhaler     . zolpidem (AMBIEN) 5 MG tablet Take 1 tablet (5 mg total) by mouth at bedtime as needed for sleep. 30 tablet 5   No current facility-administered medications for this visit.    Facility-Administered Medications Ordered in Other Visits  Medication Dose Route Frequency Provider Last Rate Last Dose  . sodium chloride flush (NS) 0.9 % injection 10 mL  10 mL Intravenous PRN Cammie Sickle, MD   10 mL at 01/07/16 1342    PHYSICAL EXAMINATION: ECOG PERFORMANCE STATUS: 0 - Asymptomatic  BP (!) 146/73   Pulse 61   Temp 97.9 F (36.6 C) (Tympanic)   Ht 5' 2"  (1.575 m)   Wt 128 lb (58.1 kg)   LMP 06/16/2000 (Approximate) Comment: 15 yrs ago  SpO2 98%   BMI 23.41 kg/m   Filed Weights   01/07/16 1422  Weight: 128 lb (58.1 kg)    GENERAL: Well-nourished well-developed; Alert, no distress and comfortable.   She is alone. EYES: no pallor or icterus OROPHARYNX: no thrush or ulceration; good dentition  NECK: supple, no masses felt LYMPH:  no palpable lymphadenopathy in the cervical, axillary or inguinal regions LUNGS: clear to auscultation and  No wheeze or crackles HEART/CVS: regular rate & rhythm and no murmurs; No lower extremity edema ABDOMEN:abdomen soft, non-tender and normal bowel sounds Musculoskeletal:no cyanosis of digits and no clubbing  PSYCH: alert & oriented x 3 with fluent speech NEURO: no focal motor/sensory deficits SKIN:  no rashes or significant lesions  LABORATORY DATA:  I have reviewed the data as listed    Component Value  Date/Time   NA 137 01/07/2016 1342   NA 139 01/03/2015 0910   K 3.6 01/07/2016 1342   CL 107 01/07/2016 1342   CO2 22 01/07/2016 1342   GLUCOSE 100 (H) 01/07/2016 1342   BUN 12 01/07/2016 1342   BUN 10 01/03/2015 0910   CREATININE 0.71 01/07/2016 1342   CALCIUM 9.2 01/07/2016 1342   PROT 7.2 01/07/2016 1342   PROT 6.5 01/03/2015 0910   ALBUMIN 4.1 01/07/2016 1342   ALBUMIN 3.8 01/03/2015 0910   AST 86 (H) 01/07/2016 1342   ALT 57 (H) 01/07/2016 1342   ALKPHOS 63 01/07/2016 1342   BILITOT 0.6 01/07/2016 1342   BILITOT <0.2 01/03/2015 0910   GFRNONAA >60 01/07/2016 1342   GFRAA >60 01/07/2016 1342    No results found for: SPEP,  UPEP  Lab Results  Component Value Date   WBC 4.0 01/07/2016   NEUTROABS 2.4 01/07/2016   HGB 12.0 01/07/2016   HCT 34.8 (L) 01/07/2016   MCV 107.3 (H) 01/07/2016   PLT 202 01/07/2016      Chemistry      Component Value Date/Time   NA 137 01/07/2016 1342   NA 139 01/03/2015 0910   K 3.6 01/07/2016 1342   CL 107 01/07/2016 1342   CO2 22 01/07/2016 1342   BUN 12 01/07/2016 1342   BUN 10 01/03/2015 0910   CREATININE 0.71 01/07/2016 1342      Component Value Date/Time   CALCIUM 9.2 01/07/2016 1342   ALKPHOS 63 01/07/2016 1342   AST 86 (H) 01/07/2016 1342   ALT 57 (H) 01/07/2016 1342   BILITOT 0.6 01/07/2016 1342   BILITOT <0.2 01/03/2015 0910       RADIOGRAPHIC STUDIES: I have personally reviewed the radiological images as listed and agreed with the findings in the report. No results found.   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 Currently on maintenance Alimta tolerating well.   # Proceed with Alimta today; labs okay. Will get CT scan prior to next cycle  # right chest wall pain- improved with tylenol; ? Etiology- likely from lung malignancy.   # Brain mets- s/p GK x2 lesions [facility] [ Dr.Pollock];  will get MRI brain prior to next visit. Will have it reviewed by [facility] clinic too.   # Bil tearing of eyes- try OTC anti-histamine eye drops. 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 If not improved on antihistamine drops and recommend ophthalmology referral.  # follow up in 3 weeks/ labs/scan/ chemo.  Orders Placed This Encounter  Procedures  . CT CHEST W CONTRAST    Standing Status:   Future    Standing Expiration Date:   03/08/2017    Order Specific Question:   Reason for Exam (SYMPTOM  OR DIAGNOSIS REQUIRED)    Answer:   lung cancer    Order Specific Question:   Is the patient pregnant?    Answer:   No    Order Specific Question:   Preferred imaging location?    Answer:   [facility]  . MR BRAIN W WO CONTRAST    Standing Status:   Future    Standing Expiration Date:   03/08/2017    Order Specific Question:   Reason for Exam (SYMPTOM  OR DIAGNOSIS REQUIRED)    Answer:   lung cancer    Order Specific Question:   Preferred imaging location?    Answer:   [facility] (table limit-300lbs)    Order Specific Question:   Does the patient have a pacemaker or implanted devices?    Answer:   No    Order Specific Question:   What is the patient's sedation requirement?    Answer:   No Sedation  . CBC with Differential    Standing Status:   Future    Standing Expiration Date:   01/06/2017  . Comprehensive metabolic panel    Standing Status:   Future    Standing Expiration Date:   01/06/2017     Cammie Sickle, MD 01/07/2016 4:52 PM

## 2016-01-07 NOTE — Assessment & Plan Note (Addendum)
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 Currently on maintenance Alimta tolerating well.   # Proceed with Alimta today; labs okay. Will get CT scan prior to next cycle  # right chest wall pain- improved with tylenol; ? Etiology- likely from lung malignancy.   # Brain mets- s/p GK x2 lesions in [facility] [ Dr.Pollock]; will get MRI brain prior to next visit. Will have it reviewed by [facility] clinic too.   # Bil tearing of eyes- try OTC anti-histamine eye drops. 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 If not improved on antihistamine drops and recommend ophthalmology referral.  # follow up in 3 weeks/ labs/scan/ chemo.

## 2016-01-07 NOTE — Progress Notes (Signed)
AST: 86. MD, Dr. Rogue Bussing, notified via telephone and already aware. Per MD order: proceed with treatment today.

## 2016-01-07 NOTE — Progress Notes (Signed)
Patient does not want to go back to work at this time until after her scan in Feb.  She would like to have a letter written to go back to work on March 07, 2016 to [employer] claims at 1 [phone]. / [address] [place] [address] (phone: [phone]

## 2016-01-19 ENCOUNTER — Telehealth: Payer: Self-pay | Admitting: *Deleted

## 2016-01-19 NOTE — Telephone Encounter (Signed)
Wants to cancel her CT and MRI because her insurance will cover 100% and pay airfare and hotel for her to have it done in [place]. She has already cancelled them and does not need an order from [place] to get them done

## 2016-01-23 ENCOUNTER — Ambulatory Visit
Admission: RE | Admit: 2016-01-23 | Discharge: 2016-01-23 | Disposition: A | Discharge status: Home / Self Care | Payer: BLUE CROSS/BLUE SHIELD | Source: Ambulatory Visit | Attending: Internal Medicine | Admitting: Internal Medicine

## 2016-01-23 ENCOUNTER — Ambulatory Visit: Payer: BLUE CROSS/BLUE SHIELD

## 2016-01-23 DIAGNOSIS — C7931 Secondary malignant neoplasm of brain: Secondary | ICD-10-CM

## 2016-01-23 DIAGNOSIS — I700 Atherosclerosis of aorta: Secondary | ICD-10-CM | POA: Insufficient documentation

## 2016-01-23 DIAGNOSIS — C3431 Malignant neoplasm of lower lobe, right bronchus or lung: Secondary | ICD-10-CM | POA: Insufficient documentation

## 2016-01-23 MED ORDER — IOPAMIDOL (ISOVUE-300) INJECTION 61%
75.0000 mL | Freq: Once | INTRAVENOUS | Status: AC | PRN
  Administered 2016-01-23: 75 mL via INTRAVENOUS

## 2016-01-23 MED ORDER — GADOBENATE DIMEGLUMINE 529 MG/ML IV SOLN
10.0000 mL | Freq: Once | INTRAVENOUS | Status: AC | PRN
  Administered 2016-01-23: 10 mL via INTRAVENOUS

## 2016-01-28 ENCOUNTER — Inpatient Hospital Stay: Payer: BLUE CROSS/BLUE SHIELD

## 2016-01-28 ENCOUNTER — Inpatient Hospital Stay (HOSPITAL_BASED_OUTPATIENT_CLINIC_OR_DEPARTMENT_OTHER): Payer: BLUE CROSS/BLUE SHIELD | Admitting: Internal Medicine

## 2016-01-28 ENCOUNTER — Encounter: Payer: Self-pay | Admitting: Internal Medicine

## 2016-01-28 ENCOUNTER — Other Ambulatory Visit: Payer: BLUE CROSS/BLUE SHIELD

## 2016-01-28 ENCOUNTER — Ambulatory Visit: Payer: BLUE CROSS/BLUE SHIELD

## 2016-01-28 ENCOUNTER — Ambulatory Visit: Payer: BLUE CROSS/BLUE SHIELD | Admitting: Internal Medicine

## 2016-01-28 VITALS — BP 151/92 | HR 80 | Temp 98.1°F | Wt 130.2 lb

## 2016-01-28 DIAGNOSIS — C3431 Malignant neoplasm of lower lobe, right bronchus or lung: Secondary | ICD-10-CM

## 2016-01-28 DIAGNOSIS — F329 Major depressive disorder, single episode, unspecified: Secondary | ICD-10-CM

## 2016-01-28 DIAGNOSIS — C801 Malignant (primary) neoplasm, unspecified: Secondary | ICD-10-CM

## 2016-01-28 DIAGNOSIS — Z8701 Personal history of pneumonia (recurrent): Secondary | ICD-10-CM

## 2016-01-28 DIAGNOSIS — R0789 Other chest pain: Secondary | ICD-10-CM

## 2016-01-28 DIAGNOSIS — Z79899 Other long term (current) drug therapy: Secondary | ICD-10-CM | POA: Diagnosis not present

## 2016-01-28 DIAGNOSIS — H579 Unspecified disorder of eye and adnexa: Secondary | ICD-10-CM

## 2016-01-28 DIAGNOSIS — F419 Anxiety disorder, unspecified: Secondary | ICD-10-CM

## 2016-01-28 DIAGNOSIS — Z8041 Family history of malignant neoplasm of ovary: Secondary | ICD-10-CM

## 2016-01-28 DIAGNOSIS — C7931 Secondary malignant neoplasm of brain: Secondary | ICD-10-CM | POA: Diagnosis not present

## 2016-01-28 DIAGNOSIS — Z87891 Personal history of nicotine dependence: Secondary | ICD-10-CM

## 2016-01-28 DIAGNOSIS — Z7189 Other specified counseling: Secondary | ICD-10-CM | POA: Insufficient documentation

## 2016-01-28 LAB — COMPREHENSIVE METABOLIC PANEL
ALT: 82 U/L — ABNORMAL HIGH (ref 14–54)
AST: 115 U/L — AB (ref 15–41)
Albumin: 4.3 g/dL (ref 3.5–5.0)
Alkaline Phosphatase: 72 U/L (ref 38–126)
Anion gap: 9 (ref 5–15)
BUN: 10 mg/dL (ref 6–20)
CALCIUM: 9.2 mg/dL (ref 8.9–10.3)
CO2: 21 mmol/L — ABNORMAL LOW (ref 22–32)
Chloride: 107 mmol/L (ref 101–111)
Creatinine, Ser: 0.75 mg/dL (ref 0.44–1.00)
GFR calc Af Amer: >60 mL/min (ref >60)
Glucose, Bld: 112 mg/dL — ABNORMAL HIGH (ref 65–99)
POTASSIUM: 3.7 mmol/L (ref 3.5–5.1)
Sodium: 137 mmol/L (ref 135–145)
TOTAL PROTEIN: 7.1 g/dL (ref 6.5–8.1)
Total Bilirubin: 0.6 mg/dL (ref 0.3–1.2)

## 2016-01-28 LAB — CBC WITH DIFFERENTIAL/PLATELET
Basophils Absolute: 0 10*3/uL (ref 0–0.1)
Basophils Relative: 1 %
Eosinophils Absolute: 0.1 10*3/uL (ref 0–0.7)
Eosinophils Relative: 2 %
HEMATOCRIT: 35.1 % (ref 35.0–47.0)
Hemoglobin: 12.3 g/dL (ref 12.0–16.0)
LYMPHS PCT: 19 %
Lymphs Abs: 0.8 10*3/uL — ABNORMAL LOW (ref 1.0–3.6)
MCH: 37.7 pg — ABNORMAL HIGH (ref 26.0–34.0)
MCHC: 35.1 g/dL (ref 32.0–36.0)
MCV: 107.5 fL — AB (ref 80.0–100.0)
MONO ABS: 0.5 10*3/uL (ref 0.2–0.9)
MONOS PCT: 13 %
NEUTROS ABS: 2.8 10*3/uL (ref 1.4–6.5)
Neutrophils Relative %: 65 %
Platelets: 282 10*3/uL (ref 150–440)
RBC: 3.26 MIL/uL — ABNORMAL LOW (ref 3.80–5.20)
RDW: 13.5 % (ref 11.5–14.5)
WBC: 4.2 10*3/uL (ref 3.6–11.0)

## 2016-01-28 LAB — TSH: TSH: 2.156 u[IU]/mL (ref 0.350–4.500)

## 2016-01-28 MED ORDER — HEPARIN SOD (PORK) LOCK FLUSH 100 UNIT/ML IV SOLN
500.0000 [IU] | Freq: Once | INTRAVENOUS | Status: AC
  Administered 2016-01-28: 500 [IU] via INTRAVENOUS

## 2016-01-28 NOTE — Assessment & Plan Note (Addendum)
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 Currently on maintenance Alimta tolerating well; Jan 19th CT- progression of lung lesions/ ? lymphangiectic spread; also Brain MRI- shows 2 enhancing lesions in brain [discussion below]  # Recommend immunotherapy with opdivo.  I discussed the mechanism of action; The goal of therapy is palliative; and length of treatments are likely ongoing/based upon the results of the scans. Discussed the potential side effects of immunotherapy including but not limited to diarrhea; skin rash; elevated LFTs/endocrine abnormalities etc. Add TSH to labs today.   # Brain mets [s/p GK x2- right frontal; left posterior parietal lesions]- again MRI jan 19th - shows these 2 enhancing lesions. Likely treated Brain mets. Will have imaging reviewed by Radiation Oncology at [facility] clinic to confirm. Patient will call us with the name of the radiation oncologist.   # Reviewed/counselled regarding the goals of care- being palliative/treatment are usually indefinite-until progression or side effects. Goal is to maintain quality of life as the disease is incurable.  # right chest wall pain- improved with tylenol; ? Etiology- likely from lung malignancy.   #  Start OPDIVO next week; ffollow up in 3 weeks/ labs prior to second cycle.   # # I reviewed the blood work- with the patient in detail; also reviewed the imaging independently [as summarized above]; and with the patient in detail.

## 2016-01-28 NOTE — Progress Notes (Signed)
[place] OFFICE PROGRESS NOTE  Patient Care Team: Mar Daring, PA-C as PCP - General (Family Medicine) Allyne Gee, MD as Referring Physician (Internal Medicine)  Malignant neoplasm of trachea, bronchus, and lung [facility])   Staging form: Lung, AJCC 7th Edition     Clinical: Stage IIIB (T4, N2, M0) - Signed by Cammie Sickle, MD on 06/24/2015    Oncology History   # FEB 2017-[place] Right Lower Lung T4N2M0- STAGE IIIB [Bronch & Subcarinal LNBx]- March 8th START CARBO-ALIMTA x4 cycles- June 8th 2017 - IMPROVED FDG MULTI-FOCAL lesions/N2-LN activity. S/p [place]-- Alimta x6  # AUG 18th- Alimta- Avastin maintenance; SEP 13th DISCONTINUE AVASTIN [sec to cavitary lesion]; OCT 20th 2017- CT-STABLE Disease; NOV 8th CT/PET [facility] clinic]- "overall slight progression-? Lymphangiectatic spread"  # NOV 2nd 2017-MRI, [facility]- Brain mets [asymptomatic;Right frontal ~51m; ~370mlesions -appx 3-4 lesions; (right Frontal & Left parietal s/p GK x 2 lesions]   # [facility] [Dr.Leventakos; [phone]/[facility]BO[facility]] MOLECULAR STUDIES:  KRAS MUTATED/Tissues not sufficient for OTHER the molecular marker; will need repeat Bx ; GUARDIANT TESTING [[facility]]- pending.      Primary [facility] of right lower lobe of lung ([facility]  10/08/2015 Initial Diagnosis    Primary cancer of right lower lobe of lung ([facility]      INTERVAL HISTORY:  Deanna Boss36.o.  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  Patient's right chest wall pain is improved. Denies any pain at this time. She has intermittent flashes of lights that lasts for a few seconds. Denies any constant headaches.  Fatigue improved. No nausea no vomiting. No headaches.  No weight loss. No nausea no  vomiting. No chest pain or shortness of breath or cough.   REVIEW OF SYSTEMS:  A complete 10 point review of system is done which is negative except mentioned above/history of present illness.   PAST MEDICAL HISTORY :  Past Medical History:  Diagnosis Date  . Anxiety   . Cancer of right lung ([facility] [place]2/09/2015  . Depression   . Depression with anxiety    Well controlled with Wellbutrin and Buspar  . Pneumonia     PAST SURGICAL HISTORY :   Past Surgical History:  Procedure Laterality Date  . ECTOPIC PREGNANCY SURGERY  1988  . ELECTROMAGNETIC NAVIGATION BROCHOSCOPY N/A 02/10/2015   Procedure: ELECTROMAGNETIC NAVIGATION BRONCHOSCOPY;  Surgeon: KuFlora LippsMD;  Location: [facility] ORS;  Service: Cardiopulmonary;  Laterality: N/A;  . ENDOBRONCHIAL ULTRASOUND N/A 02/10/2015   Procedure: ENDOBRONCHIAL ULTRASOUND;  Surgeon: KuFlora LippsMD;  Location: [facility] ORS;  Service: Cardiopulmonary;  Laterality: N/A;  . PERIPHERAL VASCULAR CATHETERIZATION N/A 03/05/2015   Procedure: PoGlori Luisath Insertion;  Surgeon: GrKatha CabalMD;  [facility];  Service: Cardiovascular;  Laterality: N/A;    FAMILY HISTORY :   Family History  Problem Relation Age of Onset  . Cancer Father     Prostate Cancer  . Hypertension Father   . Stroke Father   . Hypertension Brother     SOCIAL HISTORY:   Social History  Substance Use Topics  . Smoking status: Former Smoker    Packs/day: 0.25    Years: 30.00    Types: Cigarettes    Quit date: 02/06/2005  .  Smokeless tobacco: Never Used  . Alcohol use 3.0 oz/week    5 Cans of beer per week     Comment: 5 beers weekly    ALLERGIES:  has No Known Allergies.  MEDICATIONS:  Current Outpatient Prescriptions  Medication Sig Dispense Refill  . busPIRone (BUSPAR) 5 MG tablet TAKE ONE TABLET BY MOUTH TWICE DAILY 60 tablet 6  . dexamethasone (DECADRON) 4 MG tablet Take 1 tablet by mouth at 8am and 5pm on day 2 and day 3 after chemotherapy. 30 tablet 3  .  folic acid (FOLVITE) 1 MG tablet Take 1 tablet (1 mg total) by mouth daily. 30 tablet 3  . lidocaine-prilocaine (EMLA) cream Apply 1 application topically as needed. 30 g 2  . SPIRIVA RESPIMAT 1.25 MCG/ACT AERS     . SYMBICORT 80-4.5 MCG/ACT inhaler     . zolpidem (AMBIEN) 5 MG tablet Take 1 tablet (5 mg total) by mouth at bedtime as needed for sleep. 30 tablet 5   No current facility-administered medications for this visit.     PHYSICAL EXAMINATION: ECOG PERFORMANCE STATUS: 0 - Asymptomatic  BP (!) 151/92 (BP Location: Right Arm, Patient Position: Sitting)   Pulse 80   Temp 98.1 F (36.7 C) (Tympanic)   Wt 130 lb 4 oz (59.1 kg)   LMP 06/16/2000 (Approximate) Comment: 15 yrs ago  BMI 23.82 kg/m   Filed Weights   01/28/16 0949  Weight: 130 lb 4 oz (59.1 kg)    GENERAL: Well-nourished well-developed; Alert, no distress and comfortable.   She is alone. EYES: no pallor or icterus OROPHARYNX: no thrush or ulceration; good dentition  NECK: supple, no masses felt LYMPH:  no palpable lymphadenopathy in the cervical, axillary or inguinal regions LUNGS: clear to auscultation and  No wheeze or crackles HEART/CVS: regular rate & rhythm and no murmurs; No lower extremity edema ABDOMEN:abdomen soft, non-tender and normal bowel sounds Musculoskeletal:no cyanosis of digits and no clubbing  PSYCH: alert & oriented x 3 with fluent speech NEURO: no focal motor/sensory deficits SKIN:  no rashes or significant lesions  LABORATORY DATA:  I have reviewed the data as listed    Component Value Date/Time   NA 137 01/28/2016 0915   NA 139 01/03/2015 0910   K 3.7 01/28/2016 0915   CL 107 01/28/2016 0915   CO2 21 (L) 01/28/2016 0915   GLUCOSE 112 (H) 01/28/2016 0915   BUN 10 01/28/2016 0915   BUN 10 01/03/2015 0910   CREATININE 0.75 01/28/2016 0915   CALCIUM 9.2 01/28/2016 0915   PROT 7.1 01/28/2016 0915   PROT 6.5 01/03/2015 0910   ALBUMIN 4.3 01/28/2016 0915   ALBUMIN 3.8 01/03/2015 0910    AST 115 (H) 01/28/2016 0915   ALT 82 (H) 01/28/2016 0915   ALKPHOS 72 01/28/2016 0915   BILITOT 0.6 01/28/2016 0915   BILITOT <0.2 01/03/2015 0910   GFRNONAA >60 01/28/2016 0915   GFRAA >60 01/28/2016 0915    No results found for: SPEP, UPEP  Lab Results  Component Value Date   WBC 4.2 01/28/2016   NEUTROABS 2.8 01/28/2016   HGB 12.3 01/28/2016   HCT 35.1 01/28/2016   MCV 107.5 (H) 01/28/2016   PLT 282 01/28/2016      Chemistry      Component Value Date/Time   NA 137 01/28/2016 0915   NA 139 01/03/2015 0910   K 3.7 01/28/2016 0915   CL 107 01/28/2016 0915   CO2 21 (L) 01/28/2016 0915   BUN 10 01/28/2016  0915   BUN 10 01/03/2015 0910   CREATININE 0.75 01/28/2016 0915      Component Value Date/Time   CALCIUM 9.2 01/28/2016 0915   ALKPHOS 72 01/28/2016 0915   AST 115 (H) 01/28/2016 0915   ALT 82 (H) 01/28/2016 0915   BILITOT 0.6 01/28/2016 0915   BILITOT <0.2 01/03/2015 0910       RADIOGRAPHIC STUDIES: I have personally reviewed the radiological images as listed and agreed with the findings in the report. No results found.   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 Currently on maintenance Alimta tolerating well; Jan 19th CT- progression of lung lesions/ ? lymphangiectic spread; also Brain MRI- shows 2 enhancing lesions in brain [discussion below]  # Recommend immunotherapy with opdivo.  I discussed the mechanism of action; The goal of therapy is palliative; and length of treatments are likely ongoing/based upon the results of the scans. Discussed the potential side effects of immunotherapy including but not limited to diarrhea; skin rash; elevated LFTs/endocrine abnormalities etc. Add TSH to labs today.   # Brain mets [s/p GK x2- right frontal; left posterior parietal lesions]- again MRI jan 19th - shows these 2 enhancing lesions. Likely  treated Brain mets. Will have imaging reviewed by Radiation Oncology at [facility] clinic to confirm. Patient will call us with the name of the radiation oncologist.   # Reviewed/counselled regarding the goals of care- being palliative/treatment are usually indefinite-until progression or side effects. Goal is to maintain quality of life as the disease is incurable.  # right chest wall pain- improved with tylenol; ? Etiology- likely from lung malignancy.   #  Start OPDIVO next week; ffollow up in 3 weeks/ labs prior to second cycle.   # # I reviewed the blood work- with the patient in detail; also reviewed the imaging independently [as summarized above]; and with the patient in detail.    Orders Placed This Encounter  Procedures  . TSH    Standing Status:   Future    Number of Occurrences:   1    Standing Expiration Date:   01/27/2017  . CBC with Differential    Standing Status:   Future    Standing Expiration Date:   01/27/2017  . Comprehensive metabolic panel    Standing Status:   Future    Standing Expiration Date:   01/27/2017     Cammie Sickle, MD 01/28/2016 5:24 PM

## 2016-01-28 NOTE — Progress Notes (Signed)
Patient here today for follow up. Patient states no new concerns

## 2016-01-28 NOTE — Progress Notes (Signed)
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 Inline filter required (low protein binding) Dose Mod: None Additional Orders: Severe immune-mediated reactions can occur (e.g. pneumonitis, colitis, and hepatitis). See prescribing information for more details including monitoring and required immediate management with steroids. Monitor thyroid, renal, liver  function tests, glucose, and sodium at baseline and periodically during therapy.  **Always confirm dose/schedule in your pharmacy ordering system**    Patient Characteristics: Stage IV Metastatic, Non Squamous, Second Line - Chemotherapy/Immunotherapy, PS = 0, 1, No Prior PD-1/PD-L1  Inhibitor and Immunotherapy Candidate AJCC T Category: T4 Current Disease Status: Distant Metastases AJCC N Category: N2 AJCC M Category: M1a AJCC 8 Stage Grouping: IVA Histology: Non Squamous Cell ROS1 Rearrangement Status: Quantity Not Sufficient T790M Mutation Status: Not Applicable - EGFR Mutation Negative/Unknown Other Mutations/Biomarkers: No Other Actionable Mutations PD-L1 Expression Status: Quantity Not Sufficient Chemotherapy/Immunotherapy LOT: Second Line Chemotherapy/Immunotherapy Molecular Targeted Therapy: Not Appropriate ALK Translocation Status: Quantity Not Sufficient Would you be surprised if this patient died  in the next year? I would be surprised if this patient died in the next year EGFR Mutation Status: Quantity Not Sufficient BRAF V600E Mutation Status: Quantity Not Sufficient Performance Status: PS = 0, 1 Immunotherapy Candidate Status: Candidate for Immunotherapy Prior Immunotherapy Status: No Prior PD-1/PD-L1 Inhibitor  Intent of Therapy: Non-Curative / Palliative Intent, Discussed with Patient

## 2016-01-30 ENCOUNTER — Telehealth: Payer: Self-pay | Admitting: *Deleted

## 2016-01-30 NOTE — Telephone Encounter (Signed)
[facility] answered and it was a Advertising account executive

## 2016-02-04 ENCOUNTER — Inpatient Hospital Stay: Payer: BLUE CROSS/BLUE SHIELD

## 2016-02-04 VITALS — BP 150/92 | HR 68 | Temp 97.7°F | Resp 18

## 2016-02-04 DIAGNOSIS — C3431 Malignant neoplasm of lower lobe, right bronchus or lung: Secondary | ICD-10-CM | POA: Diagnosis not present

## 2016-02-04 DIAGNOSIS — C7931 Secondary malignant neoplasm of brain: Secondary | ICD-10-CM

## 2016-02-04 MED ORDER — HEPARIN SOD (PORK) LOCK FLUSH 100 UNIT/ML IV SOLN
500.0000 [IU] | Freq: Once | INTRAVENOUS | Status: AC | PRN
Start: 2016-02-04 — End: 2016-02-04
  Administered 2016-02-04: 500 [IU]
  Filled 2016-02-04: qty 5

## 2016-02-04 MED ORDER — SODIUM CHLORIDE 0.9 % IV SOLN
240.0000 mg | Freq: Once | INTRAVENOUS | Status: AC
  Administered 2016-02-04: 240 mg via INTRAVENOUS
  Filled 2016-02-04: qty 20

## 2016-02-04 MED ORDER — SODIUM CHLORIDE 0.9 % IV SOLN
Freq: Once | INTRAVENOUS | Status: AC
  Administered 2016-02-04: 10:00:00 via INTRAVENOUS
  Filled 2016-02-04: qty 1000

## 2016-02-04 MED ORDER — SODIUM CHLORIDE 0.9% FLUSH
10.0000 mL | INTRAVENOUS | Status: DC | PRN
  Filled 2016-02-04: qty 10

## 2016-02-18 ENCOUNTER — Inpatient Hospital Stay: Payer: BLUE CROSS/BLUE SHIELD | Attending: Internal Medicine | Admitting: Internal Medicine

## 2016-02-18 ENCOUNTER — Inpatient Hospital Stay: Payer: BLUE CROSS/BLUE SHIELD

## 2016-02-18 VITALS — BP 129/84 | HR 69 | Temp 97.4°F | Wt 128.2 lb

## 2016-02-18 DIAGNOSIS — G893 Neoplasm related pain (acute) (chronic): Secondary | ICD-10-CM | POA: Insufficient documentation

## 2016-02-18 DIAGNOSIS — Z8701 Personal history of pneumonia (recurrent): Secondary | ICD-10-CM | POA: Insufficient documentation

## 2016-02-18 DIAGNOSIS — Z8041 Family history of malignant neoplasm of ovary: Secondary | ICD-10-CM | POA: Insufficient documentation

## 2016-02-18 DIAGNOSIS — Z87891 Personal history of nicotine dependence: Secondary | ICD-10-CM | POA: Insufficient documentation

## 2016-02-18 DIAGNOSIS — F418 Other specified anxiety disorders: Secondary | ICD-10-CM | POA: Diagnosis not present

## 2016-02-18 DIAGNOSIS — C7931 Secondary malignant neoplasm of brain: Secondary | ICD-10-CM | POA: Insufficient documentation

## 2016-02-18 DIAGNOSIS — M542 Cervicalgia: Secondary | ICD-10-CM | POA: Insufficient documentation

## 2016-02-18 DIAGNOSIS — Z79899 Other long term (current) drug therapy: Secondary | ICD-10-CM | POA: Diagnosis not present

## 2016-02-18 DIAGNOSIS — C3431 Malignant neoplasm of lower lobe, right bronchus or lung: Secondary | ICD-10-CM | POA: Diagnosis not present

## 2016-02-18 DIAGNOSIS — Z5112 Encounter for antineoplastic immunotherapy: Secondary | ICD-10-CM | POA: Insufficient documentation

## 2016-02-18 LAB — CBC WITH DIFFERENTIAL/PLATELET
BASOS ABS: 0 10*3/uL (ref 0–0.1)
BASOS PCT: 1 %
EOS PCT: 1 %
Eosinophils Absolute: 0.1 10*3/uL (ref 0–0.7)
HEMATOCRIT: 34.3 % — AB (ref 35.0–47.0)
Hemoglobin: 12.1 g/dL (ref 12.0–16.0)
Lymphocytes Relative: 15 %
Lymphs Abs: 0.8 10*3/uL — ABNORMAL LOW (ref 1.0–3.6)
MCH: 37.3 pg — ABNORMAL HIGH (ref 26.0–34.0)
MCHC: 35.4 g/dL (ref 32.0–36.0)
MCV: 105.5 fL — ABNORMAL HIGH (ref 80.0–100.0)
MONO ABS: 0.5 10*3/uL (ref 0.2–0.9)
Monocytes Relative: 10 %
NEUTROS ABS: 3.9 10*3/uL (ref 1.4–6.5)
Neutrophils Relative %: 73 %
PLATELETS: 267 10*3/uL (ref 150–440)
RBC: 3.25 MIL/uL — ABNORMAL LOW (ref 3.80–5.20)
RDW: 13.2 % (ref 11.5–14.5)
WBC: 5.3 10*3/uL (ref 3.6–11.0)

## 2016-02-18 LAB — COMPREHENSIVE METABOLIC PANEL
ALBUMIN: 4.2 g/dL (ref 3.5–5.0)
ALT: 34 U/L (ref 14–54)
AST: 70 U/L — AB (ref 15–41)
Alkaline Phosphatase: 77 U/L (ref 38–126)
Anion gap: 9 (ref 5–15)
BUN: 13 mg/dL (ref 6–20)
CHLORIDE: 106 mmol/L (ref 101–111)
CO2: 22 mmol/L (ref 22–32)
Calcium: 9.3 mg/dL (ref 8.9–10.3)
Creatinine, Ser: 0.75 mg/dL (ref 0.44–1.00)
GFR calc Af Amer: >60 mL/min (ref >60)
GFR calc non Af Amer: >60 mL/min (ref >60)
GLUCOSE: 111 mg/dL — AB (ref 65–99)
POTASSIUM: 3.8 mmol/L (ref 3.5–5.1)
Sodium: 137 mmol/L (ref 135–145)
Total Bilirubin: 0.7 mg/dL (ref 0.3–1.2)
Total Protein: 7.5 g/dL (ref 6.5–8.1)

## 2016-02-18 MED ORDER — HEPARIN SOD (PORK) LOCK FLUSH 100 UNIT/ML IV SOLN
500.0000 [IU] | Freq: Once | INTRAVENOUS | Status: AC | PRN
  Administered 2016-02-18: 500 [IU]
  Filled 2016-02-18: qty 5

## 2016-02-18 MED ORDER — SODIUM CHLORIDE 0.9 % IV SOLN
Freq: Once | INTRAVENOUS | Status: AC
  Administered 2016-02-18: 11:00:00 via INTRAVENOUS
  Filled 2016-02-18: qty 1000

## 2016-02-18 MED ORDER — SODIUM CHLORIDE 0.9 % IV SOLN
240.0000 mg | Freq: Once | INTRAVENOUS | Status: AC
  Administered 2016-02-18: 240 mg via INTRAVENOUS
  Filled 2016-02-18: qty 20

## 2016-02-18 MED ORDER — SODIUM CHLORIDE 0.9% FLUSH
10.0000 mL | INTRAVENOUS | Status: DC | PRN
  Administered 2016-02-18: 10 mL
  Filled 2016-02-18: qty 10

## 2016-02-18 NOTE — Progress Notes (Signed)
Patient here today for follow up.   

## 2016-02-18 NOTE — Assessment & Plan Note (Addendum)
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 lymphangiectic spread;  also Brain MRI- shows 2 enhancing lesions in brain [discussion below] Currently opdivo. Tolerating treatment well. No evidence of progression.   #  Continue opdivo cycle #2;  labs okay   # Brain mets [s/p GK x2- right frontal; left posterior parietal lesions]- again MRI jan 19th - shows these 2 enhancing lesions. Likely treated Brain mets. Will discuss with [place] clinic doc. Left message for Dr.Levantakos.   # right chest wall pain- improved with tylenol; ? Etiology- likely from lung malignancy.   # follow up 2 weeks/ labs; 4 weeks.   # 336- [id number] [cell pt]- will call pt after speaking to [place].

## 2016-02-18 NOTE — Progress Notes (Signed)
[place] OFFICE PROGRESS NOTE  Patient Care Team: Mar Daring, PA-C as PCP - General (Family Medicine) Allyne Gee, MD as Referring Physician (Internal Medicine)  Malignant neoplasm of trachea, bronchus, and lung [facility])   Staging form: Lung, AJCC 7th Edition     Clinical: Stage IIIB (T4, N2, M0) - Signed by Cammie Sickle, MD on 06/24/2015    Oncology History   # FEB 2017-[place] Right Lower Lung T4N2M0- STAGE IIIB [Bronch & Subcarinal LNBx]- March 8th START CARBO-ALIMTA x4 cycles- June 8th 2017 - IMPROVED FDG MULTI-FOCAL lesions/N2-LN activity. S/p [place]-- Alimta x6  # AUG 18th- Alimta- Avastin maintenance; SEP 13th DISCONTINUE AVASTIN [sec to cavitary lesion]; OCT 20th 2017- CT-STABLE Disease; NOV 8th CT/PET [facility] clinic]- "overall slight progression-? Lymphangiectatic spread"  # NOV 2nd 2017-MRI, [facility]- Brain mets [asymptomatic;Right frontal ~89m; ~357mlesions -appx 3-4 lesions; (right Frontal & Left parietal s/p GK x 2 lesions]   [facility] [Dr.Leventakos; [phone]/MVEH] MOLECULAR STUDIES:  KRAS MUTATED/Tissues not sufficient for OTHER the molecular marker; will need repeat Bx ; GUARDIANT TESTING [[facility]]- pending.      [place] of [place] of [place] ([place]  10/08/2015 Initial Diagnosis    Primary cancer of right lower lobe of lung (HCC)       INTERVAL HISTORY:  WaVielka Klinedinst58.o.  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 She is s/p cycle #1 of opdivo.  She complains of mild fatigue post infusion. Otherwise denies any pain.  No nausea no vomiting. No headaches.  No weight loss. No nausea no vomiting. No chest pain or shortness of breath or cough.   REVIEW OF SYSTEMS:  A complete 10 point review of system is done which is negative except mentioned above/history of  present illness.   PAST MEDICAL HISTORY :  Past Medical History:  Diagnosis Date  . Anxiety   . Cancer of right lung (HC[facility] [place]2/09/2015  . Depression   . Depression with anxiety    Well controlled with Wellbutrin and Buspar  . Pneumonia     PAST SURGICAL HISTORY :   Past Surgical History:  Procedure Laterality Date  . ECTOPIC PREGNANCY SURGERY  1988  . ELECTROMAGNETIC NAVIGATION BROCHOSCOPY N/A 02/10/2015   Procedure: ELECTROMAGNETIC NAVIGATION BRONCHOSCOPY;  Surgeon: KuFlora LippsMD;  Location: [facility] ORS;  Service: Cardiopulmonary;  Laterality: N/A;  . ENDOBRONCHIAL ULTRASOUND N/A 02/10/2015   Procedure: ENDOBRONCHIAL ULTRASOUND;  Surgeon: KuFlora LippsMD;  Location: [facility] ORS;  Service: Cardiopulmonary;  Laterality: N/A;  . PERIPHERAL VASCULAR CATHETERIZATION N/A 03/05/2015   Procedure: PoGlori Luisath Insertion;  Surgeon: GrKatha CabalMD;  [facility];  Service: Cardiovascular;  Laterality: N/A;    FAMILY HISTORY :   Family History  Problem Relation Age of Onset  . Cancer Father     Prostate Cancer  . Hypertension Father   . Stroke Father   . Hypertension Brother     SOCIAL HISTORY:   Social History  Substance Use Topics  . Smoking status: Former Smoker    Packs/day: 0.25    Years: 30.00    Types: Cigarettes    Quit date: 02/06/2005  . Smokeless tobacco: Never Used  . Alcohol use 3.0 oz/week    5 Cans of beer per week     Comment: 5  beers weekly    ALLERGIES:  has No Known Allergies.  MEDICATIONS:  Current Outpatient Prescriptions  Medication Sig Dispense Refill  . busPIRone (BUSPAR) 5 MG tablet TAKE ONE TABLET BY MOUTH TWICE DAILY 60 tablet 6  . folic acid (FOLVITE) 1 MG tablet Take 1 tablet (1 mg total) by mouth daily. 30 tablet 3  . lidocaine-prilocaine (EMLA) cream Apply 1 application topically as needed. 30 g 2  . SPIRIVA RESPIMAT 1.25 MCG/ACT AERS     . SYMBICORT 80-4.5 MCG/ACT inhaler     . zolpidem (AMBIEN) 5 MG tablet Take 1 tablet (5 mg  total) by mouth at bedtime as needed for sleep. 30 tablet 5   No current facility-administered medications for this visit.    Facility-Administered Medications Ordered in Other Visits  Medication Dose Route Frequency Provider Last Rate Last Dose  . sodium chloride flush (NS) 0.9 % injection 10 mL  10 mL Intracatheter PRN Cammie Sickle, MD   10 mL at 02/18/16 0919    PHYSICAL EXAMINATION: ECOG PERFORMANCE STATUS: 0 - Asymptomatic  BP 129/84 (BP Location: Right Arm, Patient Position: Sitting)   Pulse 69   Temp 97.4 F (36.3 C) (Tympanic)   Wt 128 lb 4 oz (58.2 kg)   LMP 06/16/2000 (Approximate) Comment: 15 yrs ago  BMI 23.46 kg/m   Filed Weights   02/18/16 0930  Weight: 128 lb 4 oz (58.2 kg)    GENERAL: Well-nourished well-developed; Alert, no distress and comfortable.   She is alone. EYES: no pallor or icterus OROPHARYNX: no thrush or ulceration; good dentition  NECK: supple, no masses felt LYMPH:  no palpable lymphadenopathy in the cervical, axillary or inguinal regions LUNGS: clear to auscultation and  No wheeze or crackles HEART/CVS: regular rate & rhythm and no murmurs; No lower extremity edema ABDOMEN:abdomen soft, non-tender and normal bowel sounds Musculoskeletal:no cyanosis of digits and no clubbing  PSYCH: alert & oriented x 3 with fluent speech NEURO: no focal motor/sensory deficits SKIN:  no rashes or significant lesions  LABORATORY DATA:  I have reviewed the data as listed    Component Value Date/Time   NA 137 02/18/2016 0905   NA 139 01/03/2015 0910   K 3.8 02/18/2016 0905   CL 106 02/18/2016 0905   CO2 22 02/18/2016 0905   GLUCOSE 111 (H) 02/18/2016 0905   BUN 13 02/18/2016 0905   BUN 10 01/03/2015 0910   CREATININE 0.75 02/18/2016 0905   CALCIUM 9.3 02/18/2016 0905   PROT 7.5 02/18/2016 0905   PROT 6.5 01/03/2015 0910   ALBUMIN 4.2 02/18/2016 0905   ALBUMIN 3.8 01/03/2015 0910   AST 70 (H) 02/18/2016 0905   ALT 34 02/18/2016 0905    ALKPHOS 77 02/18/2016 0905   BILITOT 0.7 02/18/2016 0905   BILITOT <0.2 01/03/2015 0910   GFRNONAA >60 02/18/2016 0905   GFRAA >60 02/18/2016 0905    No results found for: SPEP, UPEP  Lab Results  Component Value Date   WBC 5.3 02/18/2016   NEUTROABS 3.9 02/18/2016   HGB 12.1 02/18/2016   HCT 34.3 (L) 02/18/2016   MCV 105.5 (H) 02/18/2016   PLT 267 02/18/2016      Chemistry      Component Value Date/Time   NA 137 02/18/2016 0905   NA 139 01/03/2015 0910   K 3.8 02/18/2016 0905   CL 106 02/18/2016 0905   CO2 22 02/18/2016 0905   BUN 13 02/18/2016 0905   BUN 10 01/03/2015 0910   CREATININE  0.75 02/18/2016 0905      Component Value Date/Time   CALCIUM 9.3 02/18/2016 0905   ALKPHOS 77 02/18/2016 0905   AST 70 (H) 02/18/2016 0905   ALT 34 02/18/2016 0905   BILITOT 0.7 02/18/2016 0905   BILITOT <0.2 01/03/2015 0910       RADIOGRAPHIC STUDIES: I have personally reviewed the radiological images as listed and agreed with the findings in the report. No results found.   ASSESSMENT & PLAN:  Primary cancer of right lower lobe of lung (HCC) T4 N2 adenocarcinoma of the lung right side- Jan 19th CT- progression of lung lesions/ ? lymphangiectic spread;  also Brain MRI- shows 2 enhancing lesions in brain [discussion below] Currently opdivo. Tolerating treatment well. No evidence of progression.   #  Continue opdivo cycle #2;  labs okay   # Brain mets [s/p GK x2- right frontal; left posterior parietal lesions]- again MRI jan 19th - shows these 2 enhancing lesions. Likely treated Brain mets. Will discuss with [place] clinic doc. Left message for Dr.Levantakos.   # right chest wall pain- improved with tylenol; ? Etiology- likely from lung malignancy.   # follow up 2 weeks/ labs; 4 weeks.   # 336- [id number] [cell pt]- will call pt after speaking to [place].   Orders Placed This Encounter  Procedures  . CBC with Differential/Platelet    Standing Status:   Standing    Number of  Occurrences:   4    Standing Expiration Date:   08/17/2016  . Comprehensive metabolic panel    Standing Status:   Standing    Number of Occurrences:   4    Standing Expiration Date:   08/17/2016     Cammie Sickle, MD 02/18/2016 4:12 PM

## 2016-02-26 ENCOUNTER — Other Ambulatory Visit: Payer: Self-pay | Admitting: Physician Assistant

## 2016-02-26 DIAGNOSIS — F411 Generalized anxiety disorder: Secondary | ICD-10-CM

## 2016-02-26 MED ORDER — BUSPIRONE HCL 5 MG PO TABS: 5.0000 mg | ORAL_TABLET | Freq: Two times a day (BID) | ORAL | 6 refills | Status: DC

## 2016-02-26 NOTE — Telephone Encounter (Signed)
LM that Refill was sent to [facility].  Thanks, Joseline

## 2016-02-26 NOTE — Telephone Encounter (Signed)
Refill sent to [place]

## 2016-02-26 NOTE — Telephone Encounter (Signed)
Patient is requesting refills on her busPIRone (BUSPAR) 5 MG tablet  She has only 3 pills left

## 2016-03-03 ENCOUNTER — Ambulatory Visit: Payer: BLUE CROSS/BLUE SHIELD | Admitting: Oncology

## 2016-03-03 ENCOUNTER — Inpatient Hospital Stay: Payer: BLUE CROSS/BLUE SHIELD

## 2016-03-03 ENCOUNTER — Inpatient Hospital Stay (HOSPITAL_BASED_OUTPATIENT_CLINIC_OR_DEPARTMENT_OTHER): Payer: BLUE CROSS/BLUE SHIELD | Admitting: Internal Medicine

## 2016-03-03 ENCOUNTER — Other Ambulatory Visit: Payer: BLUE CROSS/BLUE SHIELD

## 2016-03-03 ENCOUNTER — Ambulatory Visit: Payer: BLUE CROSS/BLUE SHIELD

## 2016-03-03 VITALS — BP 138/81 | HR 78 | Temp 97.4°F | Wt 128.1 lb

## 2016-03-03 DIAGNOSIS — M542 Cervicalgia: Secondary | ICD-10-CM

## 2016-03-03 DIAGNOSIS — Z8041 Family history of malignant neoplasm of ovary: Secondary | ICD-10-CM

## 2016-03-03 DIAGNOSIS — C7931 Secondary malignant neoplasm of brain: Secondary | ICD-10-CM

## 2016-03-03 DIAGNOSIS — C3431 Malignant neoplasm of lower lobe, right bronchus or lung: Secondary | ICD-10-CM

## 2016-03-03 DIAGNOSIS — F418 Other specified anxiety disorders: Secondary | ICD-10-CM

## 2016-03-03 DIAGNOSIS — Z79899 Other long term (current) drug therapy: Secondary | ICD-10-CM | POA: Diagnosis not present

## 2016-03-03 DIAGNOSIS — Z87891 Personal history of nicotine dependence: Secondary | ICD-10-CM

## 2016-03-03 DIAGNOSIS — G893 Neoplasm related pain (acute) (chronic): Secondary | ICD-10-CM

## 2016-03-03 DIAGNOSIS — Z8701 Personal history of pneumonia (recurrent): Secondary | ICD-10-CM

## 2016-03-03 LAB — CBC WITH DIFFERENTIAL/PLATELET
Basophils Absolute: 0 10*3/uL (ref 0–0.1)
Basophils Relative: 1 %
EOS ABS: 0.1 10*3/uL (ref 0–0.7)
EOS PCT: 2 %
HCT: 36 % (ref 35.0–47.0)
Hemoglobin: 12.9 g/dL (ref 12.0–16.0)
LYMPHS ABS: 0.9 10*3/uL — AB (ref 1.0–3.6)
Lymphocytes Relative: 23 %
MCH: 37.7 pg — AB (ref 26.0–34.0)
MCHC: 35.7 g/dL (ref 32.0–36.0)
MCV: 105.4 fL — ABNORMAL HIGH (ref 80.0–100.0)
MONOS PCT: 12 %
Monocytes Absolute: 0.5 10*3/uL (ref 0.2–0.9)
Neutro Abs: 2.4 10*3/uL (ref 1.4–6.5)
Neutrophils Relative %: 62 %
PLATELETS: 221 10*3/uL (ref 150–440)
RBC: 3.41 MIL/uL — AB (ref 3.80–5.20)
RDW: 13.4 % (ref 11.5–14.5)
WBC: 3.9 10*3/uL (ref 3.6–11.0)

## 2016-03-03 LAB — COMPREHENSIVE METABOLIC PANEL
ALT: 23 U/L (ref 14–54)
ANION GAP: 9 (ref 5–15)
AST: 42 U/L — ABNORMAL HIGH (ref 15–41)
Albumin: 4.5 g/dL (ref 3.5–5.0)
Alkaline Phosphatase: 67 U/L (ref 38–126)
BUN: 17 mg/dL (ref 6–20)
CHLORIDE: 102 mmol/L (ref 101–111)
CO2: 22 mmol/L (ref 22–32)
Calcium: 9.5 mg/dL (ref 8.9–10.3)
Creatinine, Ser: 0.75 mg/dL (ref 0.44–1.00)
GFR calc non Af Amer: >60 mL/min (ref >60)
Glucose, Bld: 101 mg/dL — ABNORMAL HIGH (ref 65–99)
Potassium: 3.9 mmol/L (ref 3.5–5.1)
Sodium: 133 mmol/L — ABNORMAL LOW (ref 135–145)
Total Bilirubin: 0.8 mg/dL (ref 0.3–1.2)
Total Protein: 7.6 g/dL (ref 6.5–8.1)

## 2016-03-03 MED ORDER — SODIUM CHLORIDE 0.9% FLUSH
10.0000 mL | INTRAVENOUS | Status: DC | PRN
  Filled 2016-03-03: qty 10

## 2016-03-03 MED ORDER — HEPARIN SOD (PORK) LOCK FLUSH 100 UNIT/ML IV SOLN: 500.0000 [IU] | Freq: Once | INTRAVENOUS | Status: DC | PRN

## 2016-03-03 MED ORDER — SODIUM CHLORIDE 0.9% FLUSH
10.0000 mL | INTRAVENOUS | Status: DC | PRN
  Administered 2016-03-03 (×2): 10 mL via INTRAVENOUS
  Filled 2016-03-03: qty 10

## 2016-03-03 MED ORDER — HEPARIN SOD (PORK) LOCK FLUSH 100 UNIT/ML IV SOLN
500.0000 [IU] | Freq: Once | INTRAVENOUS | Status: AC
  Administered 2016-03-03: 500 [IU] via INTRAVENOUS
  Filled 2016-03-03: qty 5

## 2016-03-03 MED ORDER — SODIUM CHLORIDE 0.9 % IV SOLN
240.0000 mg | Freq: Once | INTRAVENOUS | Status: AC
  Administered 2016-03-03: 240 mg via INTRAVENOUS
  Filled 2016-03-03: qty 24

## 2016-03-03 MED ORDER — SODIUM CHLORIDE 0.9 % IV SOLN
Freq: Once | INTRAVENOUS | Status: AC
  Administered 2016-03-03: 09:00:00 via INTRAVENOUS
  Filled 2016-03-03: qty 1000

## 2016-03-03 NOTE — Progress Notes (Signed)
[place] OFFICE PROGRESS NOTE  Patient Care Team: Mar Daring, PA-C as PCP - General (Family Medicine) Allyne Gee, MD as Referring Physician (Internal Medicine)  Malignant neoplasm of trachea, bronchus, and lung [facility])   Staging form: Lung, AJCC 7th Edition     Clinical: Stage IIIB (T4, N2, M0) - Signed by Cammie Sickle, MD on 06/24/2015    Oncology History   # FEB 2017-[place] Right Lower Lung T4N2M0- STAGE IIIB [Bronch & Subcarinal LNBx]- March 8th START CARBO-ALIMTA x4 cycles- June 8th 2017 - IMPROVED FDG MULTI-FOCAL lesions/N2-LN activity. S/p [place]-- Alimta x6  # AUG 18th- [place]- Avastin maintenance; SEP 13th DISCONTINUE AVASTIN [sec to cavitary lesion]; OCT 20th 2017- CT-STABLE Disease; NOV 8th CT/PET [facility] clinic]- "overall slight progression-? Lymphangiectatic spread"  # NOV 2nd 2017-MRI, [facility]- Brain mets [asymptomatic;Right frontal ~69m; ~365mlesions -appx 3-4 lesions; (right Frontal & Left parietal s/p GK x 2 lesions]   # [facility] [Dr.Leventakos; [id number]/RKYH] MOLECULAR STUDIES:  KRAS MUTATED/Tissues not sufficient for OTHER the molecular marker; will need repeat Bx ; GUARDIANT TESTING [[facility]]- pending.      [place] of [place] of [place]  10/08/2015 Initial Diagnosis    Primary cancer of right lower lobe of lung (HCC)       INTERVAL HISTORY:  WaLizmarie Witters80.o.  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 She is s/p cycle #2 of opdivo.  She complains of mild neck pain the last few days. It improves after taking Tylenol. Usually gets worse with movement. Denies any vision changes.  Otherwise denies any other pain.  No nausea no vomiting. No headaches.  No weight loss. No nausea no vomiting. No chest pain or shortness of breath or cough. Denies any  significant fatigue.  REVIEW OF SYSTEMS:  A complete 10 point review of system is done which is negative except mentioned above/history of present illness.   PAST MEDICAL HISTORY :  Past Medical History:  Diagnosis Date  . Anxiety   . Cancer of right lung ([facility]2/09/2015  . Depression   . Depression with anxiety    Well controlled with Wellbutrin and Buspar  . Pneumonia     PAST SURGICAL HISTORY :   Past Surgical History:  Procedure Laterality Date  . ECTOPIC PREGNANCY SURGERY  1988  . ELECTROMAGNETIC NAVIGATION BROCHOSCOPY N/A 02/10/2015   Procedure: ELECTROMAGNETIC NAVIGATION BRONCHOSCOPY;  Surgeon: KuFlora LippsMD;  Location: [facility] ORS;  Service: Cardiopulmonary;  Laterality: N/A;  . ENDOBRONCHIAL ULTRASOUND N/A 02/10/2015   Procedure: ENDOBRONCHIAL ULTRASOUND;  Surgeon: KuFlora LippsMD;  Location: [facility] ORS;  Service: Cardiopulmonary;  Laterality: N/A;  . PERIPHERAL VASCULAR CATHETERIZATION N/A 03/05/2015   Procedure: PoGlori Luisath Insertion;  Surgeon: GrKatha CabalMD;  [facility];  Service: Cardiovascular;  Laterality: N/A;    FAMILY HISTORY :   Family History  Problem Relation Age of Onset  . Cancer Father     Prostate Cancer  . Hypertension Father   . Stroke Father   . Hypertension Brother     SOCIAL HISTORY:   Social History  Substance Use Topics  . Smoking status: Former Smoker    Packs/day: 0.25    Years: 30.00    Types: Cigarettes    Quit date: 02/06/2005  . Smokeless tobacco: Never  Used  . Alcohol use 3.0 oz/week    5 Cans of beer per week     Comment: 5 beers weekly    ALLERGIES:  has No Known Allergies.  MEDICATIONS:  Current Outpatient Prescriptions  Medication Sig Dispense Refill  . busPIRone (BUSPAR) 5 MG tablet Take 1 tablet (5 mg total) by mouth 2 (two) times daily. 60 tablet 6  . folic acid (FOLVITE) 1 MG tablet Take 1 tablet (1 mg total) by mouth daily. 30 tablet 3  . lidocaine-prilocaine (EMLA) cream Apply 1 application  topically as needed. 30 g 2  . SPIRIVA RESPIMAT 1.25 MCG/ACT AERS     . SYMBICORT 80-4.5 MCG/ACT inhaler     . zolpidem (AMBIEN) 5 MG tablet Take 1 tablet (5 mg total) by mouth at bedtime as needed for sleep. 30 tablet 5   No current facility-administered medications for this visit.     PHYSICAL EXAMINATION: ECOG PERFORMANCE STATUS: 0 - Asymptomatic  BP 138/81 (BP Location: Right Arm, Patient Position: Sitting)   Pulse 78   Temp 97.4 F (36.3 C) (Tympanic)   Wt 128 lb 2 oz (58.1 kg)   LMP 06/16/2000 (Approximate) Comment: 15 yrs ago  BMI 23.43 kg/m   Filed Weights   03/03/16 0842  Weight: 128 lb 2 oz (58.1 kg)    GENERAL: Well-nourished well-developed; Alert, no distress and comfortable.   She is alone. EYES: no pallor or icterus OROPHARYNX: no thrush or ulceration; good dentition  NECK: supple, no masses felt LYMPH:  no palpable lymphadenopathy in the cervical, axillary or inguinal regions LUNGS: clear to auscultation and  No wheeze or crackles HEART/CVS: regular rate & rhythm and no murmurs; No lower extremity edema ABDOMEN:abdomen soft, non-tender and normal bowel sounds Musculoskeletal:no cyanosis of digits and no clubbing  PSYCH: alert & oriented x 3 with fluent speech NEURO: no focal motor/sensory deficits SKIN:  no rashes or significant lesions  LABORATORY DATA:  I have reviewed the data as listed    Component Value Date/Time   NA 133 (L) 03/03/2016 0816   NA 139 01/03/2015 0910   K 3.9 03/03/2016 0816   CL 102 03/03/2016 0816   CO2 22 03/03/2016 0816   GLUCOSE 101 (H) 03/03/2016 0816   BUN 17 03/03/2016 0816   BUN 10 01/03/2015 0910   CREATININE 0.75 03/03/2016 0816   CALCIUM 9.5 03/03/2016 0816   PROT 7.6 03/03/2016 0816   PROT 6.5 01/03/2015 0910   ALBUMIN 4.5 03/03/2016 0816   ALBUMIN 3.8 01/03/2015 0910   AST 42 (H) 03/03/2016 0816   ALT 23 03/03/2016 0816   ALKPHOS 67 03/03/2016 0816   BILITOT 0.8 03/03/2016 0816   BILITOT <0.2 01/03/2015 0910    GFRNONAA >60 03/03/2016 0816   GFRAA >60 03/03/2016 0816    No results found for: SPEP, UPEP  Lab Results  Component Value Date   WBC 3.9 03/03/2016   NEUTROABS 2.4 03/03/2016   HGB 12.9 03/03/2016   HCT 36.0 03/03/2016   MCV 105.4 (H) 03/03/2016   PLT 221 03/03/2016      Chemistry      Component Value Date/Time   NA 133 (L) 03/03/2016 0816   NA 139 01/03/2015 0910   K 3.9 03/03/2016 0816   CL 102 03/03/2016 0816   CO2 22 03/03/2016 0816   BUN 17 03/03/2016 0816   BUN 10 01/03/2015 0910   CREATININE 0.75 03/03/2016 0816      Component Value Date/Time   CALCIUM 9.5 03/03/2016 0816  ALKPHOS 67 03/03/2016 0816   AST 42 (H) 03/03/2016 0816   ALT 23 03/03/2016 0816   BILITOT 0.8 03/03/2016 0816   BILITOT <0.2 01/03/2015 0910       RADIOGRAPHIC STUDIES: I have personally reviewed the radiological images as listed and agreed with the findings in the report. No results found.   ASSESSMENT & PLAN:  Primary cancer of right lower lobe of lung (HCC) T4 N2 adenocarcinoma of the lung right side- Jan 19th CT- progression of lung lesions/ ? lymphangiectic spread;  also Brain MRI- shows 2 enhancing lesions in brain [discussion below] Currently opdivo. Tolerating treatment well. No evidence of progression.   #  Continue opdivo cycle #3;  Labs today reviewed;  acceptable for treatment today. Will order CT scan after 4-6 cycles.   # Brain mets [s/p GK x2- right frontal; left posterior parietal lesions]- again MRI jan 19th - shows these 2 enhancing lesions. Likely treated Brain mets. Discussed with Dr.Levantakos; received MRI brain images; awaiting to review with radiology.   # bil neck pain ? msk related-- improved with tylenol; okay with messages.   # follow up 2 weeks/ labs; 4 weeks.   # [phone]- [id number] [cell pt]- will call pt after reviewing the brain MRI.   No orders of the defined types were placed in this encounter.    Cammie Sickle, MD 03/03/2016 1:36 PM

## 2016-03-03 NOTE — Progress Notes (Signed)
Per v/o Dr. Lang Snow on outside imaging review in radiology. Currently radiology dept is unable to convert outside imaging into [facility]. Imaging will be sent to canopy dept to see what can be done to push images into our radiology system. Once this process is completed, then the radiologist in [place] can review the imaging.

## 2016-03-03 NOTE — Progress Notes (Signed)
Patient here today for follow up. Patient c/o neck and shoulder stiffness.

## 2016-03-03 NOTE — Assessment & Plan Note (Addendum)
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 lymphangiectic spread;  also Brain MRI- shows 2 enhancing lesions in brain [discussion below] Currently opdivo. Tolerating treatment well. No evidence of progression.   #  Continue opdivo cycle #3;  Labs today reviewed;  acceptable for treatment today. Will order CT scan after 4-6 cycles.   # Brain mets [s/p GK x2- right frontal; left posterior parietal lesions]- again MRI jan 19th - shows these 2 enhancing lesions. Likely treated Brain mets. Discussed with Dr.Levantakos; received MRI brain images; awaiting to review with radiology.   # bil neck pain ? msk related-- improved with tylenol; okay with messages.   # follow up 2 weeks/ labs; 4 weeks.   # [phone]- [id number] [cell pt]- will call pt after reviewing the brain MRI.

## 2016-03-17 ENCOUNTER — Ambulatory Visit: Payer: BLUE CROSS/BLUE SHIELD | Admitting: Internal Medicine

## 2016-03-17 ENCOUNTER — Ambulatory Visit: Payer: BLUE CROSS/BLUE SHIELD

## 2016-03-17 ENCOUNTER — Other Ambulatory Visit: Payer: BLUE CROSS/BLUE SHIELD

## 2016-03-17 ENCOUNTER — Ambulatory Visit: Payer: BLUE CROSS/BLUE SHIELD | Admitting: Oncology

## 2016-03-19 ENCOUNTER — Inpatient Hospital Stay (HOSPITAL_BASED_OUTPATIENT_CLINIC_OR_DEPARTMENT_OTHER): Payer: BLUE CROSS/BLUE SHIELD | Admitting: Internal Medicine

## 2016-03-19 ENCOUNTER — Inpatient Hospital Stay: Payer: BLUE CROSS/BLUE SHIELD | Attending: Internal Medicine

## 2016-03-19 ENCOUNTER — Inpatient Hospital Stay: Payer: BLUE CROSS/BLUE SHIELD

## 2016-03-19 VITALS — BP 148/85 | HR 66 | Temp 97.8°F | Resp 18 | Wt 129.5 lb

## 2016-03-19 DIAGNOSIS — F419 Anxiety disorder, unspecified: Secondary | ICD-10-CM | POA: Diagnosis not present

## 2016-03-19 DIAGNOSIS — Z79899 Other long term (current) drug therapy: Secondary | ICD-10-CM | POA: Insufficient documentation

## 2016-03-19 DIAGNOSIS — C7951 Secondary malignant neoplasm of bone: Secondary | ICD-10-CM | POA: Insufficient documentation

## 2016-03-19 DIAGNOSIS — Z5112 Encounter for antineoplastic immunotherapy: Secondary | ICD-10-CM | POA: Insufficient documentation

## 2016-03-19 DIAGNOSIS — C3431 Malignant neoplasm of lower lobe, right bronchus or lung: Secondary | ICD-10-CM

## 2016-03-19 DIAGNOSIS — Z8701 Personal history of pneumonia (recurrent): Secondary | ICD-10-CM

## 2016-03-19 DIAGNOSIS — F329 Major depressive disorder, single episode, unspecified: Secondary | ICD-10-CM | POA: Insufficient documentation

## 2016-03-19 DIAGNOSIS — Z923 Personal history of irradiation: Secondary | ICD-10-CM | POA: Diagnosis not present

## 2016-03-19 DIAGNOSIS — Z8042 Family history of malignant neoplasm of prostate: Secondary | ICD-10-CM | POA: Diagnosis not present

## 2016-03-19 DIAGNOSIS — Z87891 Personal history of nicotine dependence: Secondary | ICD-10-CM

## 2016-03-19 DIAGNOSIS — C7931 Secondary malignant neoplasm of brain: Secondary | ICD-10-CM

## 2016-03-19 LAB — COMPREHENSIVE METABOLIC PANEL
ALT: 23 U/L (ref 14–54)
AST: 45 U/L — AB (ref 15–41)
Albumin: 4.2 g/dL (ref 3.5–5.0)
Alkaline Phosphatase: 57 U/L (ref 38–126)
Anion gap: 6 (ref 5–15)
BILIRUBIN TOTAL: 0.5 mg/dL (ref 0.3–1.2)
BUN: 15 mg/dL (ref 6–20)
CO2: 26 mmol/L (ref 22–32)
CREATININE: 0.73 mg/dL (ref 0.44–1.00)
Calcium: 9.4 mg/dL (ref 8.9–10.3)
Chloride: 104 mmol/L (ref 101–111)
Glucose, Bld: 106 mg/dL — ABNORMAL HIGH (ref 65–99)
POTASSIUM: 4 mmol/L (ref 3.5–5.1)
Sodium: 136 mmol/L (ref 135–145)
TOTAL PROTEIN: 7.2 g/dL (ref 6.5–8.1)

## 2016-03-19 LAB — CBC WITH DIFFERENTIAL/PLATELET
BASOS ABS: 0 10*3/uL (ref 0–0.1)
Basophils Relative: 1 %
EOS PCT: 3 %
Eosinophils Absolute: 0.1 10*3/uL (ref 0–0.7)
HEMATOCRIT: 35.1 % (ref 35.0–47.0)
Hemoglobin: 12.3 g/dL (ref 12.0–16.0)
LYMPHS PCT: 17 %
Lymphs Abs: 0.8 10*3/uL — ABNORMAL LOW (ref 1.0–3.6)
MCH: 36.9 pg — AB (ref 26.0–34.0)
MCHC: 35 g/dL (ref 32.0–36.0)
MCV: 105.2 fL — AB (ref 80.0–100.0)
MONOS PCT: 11 %
Monocytes Absolute: 0.5 10*3/uL (ref 0.2–0.9)
NEUTROS ABS: 3.1 10*3/uL (ref 1.4–6.5)
Neutrophils Relative %: 68 %
Platelets: 241 10*3/uL (ref 150–440)
RBC: 3.34 MIL/uL — ABNORMAL LOW (ref 3.80–5.20)
RDW: 13.1 % (ref 11.5–14.5)
WBC: 4.5 10*3/uL (ref 3.6–11.0)

## 2016-03-19 MED ORDER — SODIUM CHLORIDE 0.9 % IV SOLN
240.0000 mg | Freq: Once | INTRAVENOUS | Status: AC
  Administered 2016-03-19: 240 mg via INTRAVENOUS
  Filled 2016-03-19: qty 24

## 2016-03-19 MED ORDER — SODIUM CHLORIDE 0.9% FLUSH
10.0000 mL | INTRAVENOUS | Status: DC | PRN
  Administered 2016-03-19: 10 mL
  Filled 2016-03-19: qty 10

## 2016-03-19 MED ORDER — SODIUM CHLORIDE 0.9 % IV SOLN
Freq: Once | INTRAVENOUS | Status: AC
  Administered 2016-03-19: 12:00:00 via INTRAVENOUS
  Filled 2016-03-19: qty 1000

## 2016-03-19 MED ORDER — HEPARIN SOD (PORK) LOCK FLUSH 100 UNIT/ML IV SOLN
500.0000 [IU] | Freq: Once | INTRAVENOUS | Status: AC | PRN
  Administered 2016-03-19: 500 [IU]
  Filled 2016-03-19: qty 5

## 2016-03-19 NOTE — Assessment & Plan Note (Addendum)
T4 N2 M1-adenocarcinoma of the lung right side- Jan 19th CT- progression of lung lesions/ ? lymphangiectic spread;  also Brain MRI- shows 2 enhancing lesions in brain [discussion below] Currently opdivo. Tolerating treatment well. No evidence of progression.   #  Continue opdivo cycle #4;  Labs today reviewed;  acceptable for treatment today. Will order CT scan after 6 cycles.   # Brain mets [s/p GK x2- right frontal; left posterior parietal lesions]- again MRI jan 19th - shows these 2 enhancing lesions. Likely treated Brain mets. Discussed with radiology- the most recent scan in January was compared to the scan from [facility] clinic in November 2017. I also spoke to Dr.Levantakos. I will get MRI scans in 3 months.    # follow up 2 weeks/ labs/MD/treatment; 4 weeks.

## 2016-03-19 NOTE — Progress Notes (Signed)
Patient here today for follow up.  Patient concerned about an area on the right side of her back.

## 2016-03-19 NOTE — Progress Notes (Signed)
[place] OFFICE PROGRESS NOTE  Patient Care Team: Mar Daring, PA-C as PCP - General (Family Medicine) Allyne Gee, MD as Referring Physician (Internal Medicine)  Malignant neoplasm of trachea, bronchus, and lung [facility])   Staging form: Lung, AJCC 7th Edition     Clinical: Stage IIIB (T4, N2, M0) - Signed by Cammie Sickle, MD on 06/24/2015    Oncology History   # FEB 2017-[place] Right Lower Lung T4N2M0- STAGE IIIB [Bronch & Subcarinal LNBx]- March 8th START CARBO-ALIMTA x4 cycles- June 8th 2017 - IMPROVED FDG MULTI-FOCAL lesions/N2-LN activity. S/p [place]-- Alimta x6  # AUG 18th- Alimta- Avastin maintenance; SEP 13th DISCONTINUE AVASTIN [sec to cavitary lesion]; OCT 20th 2017- CT-STABLE Disease; NOV 8th CT/PET [facility] clinic]- "overall slight progression-? Lymphangiectatic spread"  # NOV 2nd 2017-MRI, [facility]- Brain mets [asymptomatic;Right frontal ~41m; ~35mlesions -appx 3-4 lesions; (right Frontal & Left parietal s/p GK x 2 lesions]   # [facility] [Dr.Leventakos; [phone]/[facility]] MOLECULAR STUDIES:  KRAS MUTATED/Tissues not sufficient for OTHER the molecular marker; will need repeat Bx ; GUARDIANT TESTING [[facility]]- pending.      Primary [place] of right lower lobe of [facility] ([facility]  10/08/2015 Initial Diagnosis    Primary cancer of right lower lobe of lung (HCC)       INTERVAL HISTORY:  WaJhene Westmoreland68.o.  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 She is s/p cycle #3 of opdivo.  In the [place] Was un[place]ful. She just came back on yesterday.  Complains of mild swelling in the legs currently resolved. Denies any vision changes. Otherwise denies any other pain.  No nausea no vomiting. No headaches.  No weight loss. No nausea no vomiting. No chest  pain or shortness of breath or cough. Denies any significant fatigue.  REVIEW OF SYSTEMS:  A complete 10 point review of system is done which is negative except mentioned above/history of present illness.   PAST MEDICAL HISTORY :  Past Medical History:  Diagnosis Date  . Anxiety   . [place] right [place] ([place]  . Depression   . Depression with anxiety    Well controlled with Wellbutrin and Buspar  . Pneumonia     PAST SURGICAL HISTORY :   Past Surgical History:  Procedure Laterality Date  . ECTOPIC PREGNANCY SURGERY  1988  . ELECTROMAGNETIC NAVIGATION BROCHOSCOPY N/A 02/10/2015   Procedure: ELECTROMAGNETIC NAVIGATION BRONCHOSCOPY;  Surgeon: KuFlora LippsMD;  Location: [facility] ORS;  Service: Cardiopulmonary;  Laterality: N/A;  . ENDOBRONCHIAL ULTRASOUND N/A 02/10/2015   Procedure: ENDOBRONCHIAL ULTRASOUND;  Surgeon: KuFlora LippsMD;  Location: [facility] ORS;  Service: Cardiopulmonary;  Laterality: N/A;  . PERIPHERAL VASCULAR CATHETERIZATION N/A 03/05/2015   Procedure: PoGlori Luisath Insertion;  Surgeon: GrKatha CabalMD;  [facility];  Service: Cardiovascular;  Laterality: N/A;    FAMILY HISTORY :   Family History  Problem Relation Age of Onset  . Cancer Father     Prostate Cancer  . Hypertension Father   . Stroke Father   . Hypertension Brother     SOCIAL HISTORY:   Social History  Substance Use Topics  . Smoking status: Former Smoker    Packs/day: 0.25    Years: 30.00    Types: Cigarettes  Quit date: 02/06/2005  . Smokeless tobacco: Never Used  . Alcohol use 3.0 oz/week    5 Cans of beer per week     Comment: 5 beers weekly    ALLERGIES:  has No Known Allergies.  MEDICATIONS:  Current Outpatient Prescriptions  Medication Sig Dispense Refill  . busPIRone (BUSPAR) 5 MG tablet Take 1 tablet (5 mg total) by mouth 2 (two) times daily. 60 tablet 6  . lidocaine-prilocaine (EMLA) cream Apply 1 application topically as needed. 30 g 2  . SPIRIVA RESPIMAT  1.25 MCG/ACT AERS     . SYMBICORT 80-4.5 MCG/ACT inhaler     . zolpidem (AMBIEN) 5 MG tablet Take 1 tablet (5 mg total) by mouth at bedtime as needed for sleep. 30 tablet 5   No current facility-administered medications for this visit.    Facility-Administered Medications Ordered in Other Visits  Medication Dose Route Frequency Provider Last Rate Last Dose  . sodium chloride flush (NS) 0.9 % injection 10 mL  10 mL Intracatheter PRN Cammie Sickle, MD   10 mL at 03/19/16 1040    PHYSICAL EXAMINATION: ECOG PERFORMANCE STATUS: 0 - Asymptomatic  BP (!) 148/85 (BP Location: Left Arm, Patient Position: Sitting)   Pulse 66   Temp 97.8 F (36.6 C) (Tympanic)   Resp 18   Wt 129 lb 8 oz (58.7 kg)   LMP 06/16/2000 (Approximate) Comment: 15 yrs ago  BMI 23.69 kg/m   Filed Weights   03/19/16 1100  Weight: 129 lb 8 oz (58.7 kg)    GENERAL: Well-nourished well-developed; Alert, no distress and comfortable.   She is alone. EYES: no pallor or icterus OROPHARYNX: no thrush or ulceration; good dentition  NECK: supple, no masses felt LYMPH:  no palpable lymphadenopathy in the cervical, axillary or inguinal regions LUNGS: clear to auscultation and  No wheeze or crackles HEART/CVS: regular rate & rhythm and no murmurs; No lower extremity edema ABDOMEN:abdomen soft, non-tender and normal bowel sounds Musculoskeletal:no cyanosis of digits and no clubbing  PSYCH: alert & oriented x 3 with fluent speech NEURO: no focal motor/sensory deficits SKIN:  no rashes or significant lesions  LABORATORY DATA:  I have reviewed the data as listed    Component Value Date/Time   NA 136 03/19/2016 1036   NA 139 01/03/2015 0910   K 4.0 03/19/2016 1036   CL 104 03/19/2016 1036   CO2 26 03/19/2016 1036   GLUCOSE 106 (H) 03/19/2016 1036   BUN 15 03/19/2016 1036   BUN 10 01/03/2015 0910   CREATININE 0.73 03/19/2016 1036   CALCIUM 9.4 03/19/2016 1036   PROT 7.2 03/19/2016 1036   PROT 6.5 01/03/2015  0910   ALBUMIN 4.2 03/19/2016 1036   ALBUMIN 3.8 01/03/2015 0910   AST 45 (H) 03/19/2016 1036   ALT 23 03/19/2016 1036   ALKPHOS 57 03/19/2016 1036   BILITOT 0.5 03/19/2016 1036   BILITOT <0.2 01/03/2015 0910   GFRNONAA >60 03/19/2016 1036   GFRAA >60 03/19/2016 1036    No results found for: SPEP, UPEP  Lab Results  Component Value Date   WBC 4.5 03/19/2016   NEUTROABS 3.1 03/19/2016   HGB 12.3 03/19/2016   HCT 35.1 03/19/2016   MCV 105.2 (H) 03/19/2016   PLT 241 03/19/2016      Chemistry      Component Value Date/Time   NA 136 03/19/2016 1036   NA 139 01/03/2015 0910   K 4.0 03/19/2016 1036   CL 104 03/19/2016 1036  CO2 26 03/19/2016 1036   BUN 15 03/19/2016 1036   BUN 10 01/03/2015 0910   CREATININE 0.73 03/19/2016 1036      Component Value Date/Time   CALCIUM 9.4 03/19/2016 1036   ALKPHOS 57 03/19/2016 1036   AST 45 (H) 03/19/2016 1036   ALT 23 03/19/2016 1036   BILITOT 0.5 03/19/2016 1036   BILITOT <0.2 01/03/2015 0910       RADIOGRAPHIC STUDIES: I have personally reviewed the radiological images as listed and agreed with the findings in the report. No results found.   ASSESSMENT & PLAN:  Primary cancer of right lower lobe of lung ([place]) T4 N2 M1-adenocarcinoma of the lung right side- Jan 19th CT- progression of lung lesions/ ? lymphangiectic spread;  also Brain MRI- shows 2 enhancing lesions in brain [discussion below] Currently opdivo. Tolerating treatment well. No evidence of progression.   #  Continue opdivo cycle #4;  Labs today reviewed;  acceptable for treatment today. Will order CT scan after 6 cycles.   # Brain mets [s/p GK x2- right frontal; left posterior parietal lesions]- again MRI jan 19th - shows these 2 enhancing lesions. Likely treated Brain mets. Discussed with radiology- the most recent scan in January was compared to the scan from [facility] clinic in November 2017. I also spoke to Dr.Levantakos. I will get MRI scans in 3 months.    #  follow up 2 weeks/ labs/MD/treatment; 4 weeks.   Orders Placed This Encounter  Procedures  . TSH    Standing Status:   Future    Standing Expiration Date:   04/19/2016     Cammie Sickle, MD 03/19/2016 1:43 PM

## 2016-03-31 ENCOUNTER — Inpatient Hospital Stay (HOSPITAL_BASED_OUTPATIENT_CLINIC_OR_DEPARTMENT_OTHER): Payer: BLUE CROSS/BLUE SHIELD | Admitting: Internal Medicine

## 2016-03-31 ENCOUNTER — Inpatient Hospital Stay: Payer: BLUE CROSS/BLUE SHIELD

## 2016-03-31 VITALS — BP 138/90 | HR 68 | Temp 97.8°F | Ht 62.0 in | Wt 130.0 lb

## 2016-03-31 DIAGNOSIS — Z923 Personal history of irradiation: Secondary | ICD-10-CM

## 2016-03-31 DIAGNOSIS — Z79899 Other long term (current) drug therapy: Secondary | ICD-10-CM | POA: Diagnosis not present

## 2016-03-31 DIAGNOSIS — C7951 Secondary malignant neoplasm of bone: Secondary | ICD-10-CM | POA: Diagnosis not present

## 2016-03-31 DIAGNOSIS — F419 Anxiety disorder, unspecified: Secondary | ICD-10-CM | POA: Diagnosis not present

## 2016-03-31 DIAGNOSIS — Z8701 Personal history of pneumonia (recurrent): Secondary | ICD-10-CM

## 2016-03-31 DIAGNOSIS — C7931 Secondary malignant neoplasm of brain: Secondary | ICD-10-CM

## 2016-03-31 DIAGNOSIS — F329 Major depressive disorder, single episode, unspecified: Secondary | ICD-10-CM | POA: Diagnosis not present

## 2016-03-31 DIAGNOSIS — C3431 Malignant neoplasm of lower lobe, right bronchus or lung: Secondary | ICD-10-CM

## 2016-03-31 DIAGNOSIS — Z87891 Personal history of nicotine dependence: Secondary | ICD-10-CM | POA: Diagnosis not present

## 2016-03-31 DIAGNOSIS — Z8042 Family history of malignant neoplasm of prostate: Secondary | ICD-10-CM | POA: Diagnosis not present

## 2016-03-31 LAB — TSH: TSH: 2.3 u[IU]/mL (ref 0.350–4.500)

## 2016-03-31 LAB — COMPREHENSIVE METABOLIC PANEL
ALT: 16 U/L (ref 14–54)
AST: 35 U/L (ref 15–41)
Albumin: 4 g/dL (ref 3.5–5.0)
Alkaline Phosphatase: 58 U/L (ref 38–126)
Anion gap: 6 (ref 5–15)
BILIRUBIN TOTAL: 0.6 mg/dL (ref 0.3–1.2)
BUN: 14 mg/dL (ref 6–20)
CO2: 24 mmol/L (ref 22–32)
Calcium: 9.2 mg/dL (ref 8.9–10.3)
Chloride: 106 mmol/L (ref 101–111)
Creatinine, Ser: 0.75 mg/dL (ref 0.44–1.00)
GFR calc Af Amer: >60 mL/min (ref >60)
Glucose, Bld: 103 mg/dL — ABNORMAL HIGH (ref 65–99)
POTASSIUM: 3.8 mmol/L (ref 3.5–5.1)
Sodium: 136 mmol/L (ref 135–145)
TOTAL PROTEIN: 7.2 g/dL (ref 6.5–8.1)

## 2016-03-31 LAB — CBC WITH DIFFERENTIAL/PLATELET
BASOS PCT: 1 %
Basophils Absolute: 0 10*3/uL (ref 0–0.1)
Eosinophils Absolute: 0.1 10*3/uL (ref 0–0.7)
Eosinophils Relative: 3 %
HEMATOCRIT: 34.7 % — AB (ref 35.0–47.0)
Hemoglobin: 12.5 g/dL (ref 12.0–16.0)
LYMPHS PCT: 22 %
Lymphs Abs: 0.8 10*3/uL — ABNORMAL LOW (ref 1.0–3.6)
MCH: 37.8 pg — ABNORMAL HIGH (ref 26.0–34.0)
MCHC: 35.9 g/dL (ref 32.0–36.0)
MCV: 105.2 fL — ABNORMAL HIGH (ref 80.0–100.0)
MONO ABS: 0.4 10*3/uL (ref 0.2–0.9)
MONOS PCT: 11 %
NEUTROS ABS: 2.4 10*3/uL (ref 1.4–6.5)
Neutrophils Relative %: 63 %
Platelets: 236 10*3/uL (ref 150–440)
RBC: 3.3 MIL/uL — ABNORMAL LOW (ref 3.80–5.20)
RDW: 12.8 % (ref 11.5–14.5)
WBC: 3.7 10*3/uL (ref 3.6–11.0)

## 2016-03-31 MED ORDER — SODIUM CHLORIDE 0.9% FLUSH
10.0000 mL | Freq: Once | INTRAVENOUS | Status: AC
  Administered 2016-03-31: 10 mL via INTRAVENOUS
  Filled 2016-03-31: qty 10

## 2016-03-31 MED ORDER — SODIUM CHLORIDE 0.9 % IV SOLN
240.0000 mg | Freq: Once | INTRAVENOUS | Status: AC
  Administered 2016-03-31: 240 mg via INTRAVENOUS
  Filled 2016-03-31: qty 4

## 2016-03-31 MED ORDER — SODIUM CHLORIDE 0.9 % IV SOLN
Freq: Once | INTRAVENOUS | Status: AC
  Administered 2016-03-31: 09:00:00 via INTRAVENOUS
  Filled 2016-03-31: qty 1000

## 2016-03-31 MED ORDER — HEPARIN SOD (PORK) LOCK FLUSH 100 UNIT/ML IV SOLN
500.0000 [IU] | Freq: Once | INTRAVENOUS | Status: AC
  Administered 2016-03-31: 500 [IU] via INTRAVENOUS
  Filled 2016-03-31: qty 5

## 2016-03-31 NOTE — Assessment & Plan Note (Addendum)
T4 N2 M1-adenocarcinoma of the lung right side- Jan 19th CT- progression of lung lesions/ ? lymphangiectic spread;  also Brain MRI- shows 2 enhancing lesions in brain [discussion below] Currently opdivo. Tolerating treatment well. No evidence of progression.   #  Continue opdivo cycle #5; Labs today reviewed;  acceptable for treatment today.  Will order CT scan after 6 cycles.   # Brain mets [s/p GK x2- right frontal; left posterior parietal lesions]- again MRI jan 19th - shows these 2 enhancing lesions- improved; will get MRI again in mid April 2018.   # follow up 2 weeks/ labs/MD/treatment;will order scans at next visit;  4 weeks.

## 2016-03-31 NOTE — Progress Notes (Signed)
Patient taking Buspar- she states that she is having affordability concerns for out of pocket cost. Has a voucher pcp gave her and will attempt to use this at pharmacy. If voucher can not be accepted by pharmacy, she states that she may not be on this drug long-term and will have to talk to pcp about a different option.

## 2016-03-31 NOTE — Progress Notes (Signed)
[place] OFFICE PROGRESS NOTE  Patient Care Team: Deanna Daring, PA-C as PCP - General (Family Medicine) Deanna Gee, MD as Referring Physician (Internal Medicine)  Malignant neoplasm of trachea, bronchus, and lung [facility])   Staging form: Lung, AJCC 7th Edition     Clinical: Stage IIIB (T4, N2, M0) - Signed by Deanna Sickle, MD on 06/24/2015    Oncology History   # FEB 2017-[place] Right Lower Lung T4N2M0- STAGE IIIB [Bronch & Subcarinal LNBx]- March 8th START CARBO-ALIMTA x4 cycles- June 8th 2017 - IMPROVED FDG MULTI-FOCAL lesions/N2-LN activity. S/p [place]-- Alimta x6  # AUG 18th- [place]- Avastin maintenance; SEP 13th DISCONTINUE AVASTIN [sec to cavitary lesion]; OCT 20th 2017- CT-STABLE Disease; NOV 8th CT/PET [facility] clinic]- "overall slight progression-? Lymphangiectatic spread"  # NOV 2nd 2017-MRI, [facility]- Brain mets [asymptomatic;Right frontal ~4m; ~348mlesions -appx 3-4 lesions; (right Frontal & Left parietal s/p GK x 2 lesions]   # [facility] [Dr.Leventakos; [id number]/[facility]] MOLECULAR STUDIES:  KRAS MUTATED/Tissues not sufficient for OTHER the molecular marker; will need repeat Bx ; GUARDIANT TESTING [[facility]]- pending.      [place]  10/08/2015 Initial Diagnosis    Primary cancer of right lower lobe of lung (H[facility])       INTERVAL HISTORY:  Deanna Cappiello26.o.  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 She is s/p cycle #4 of opdivo.  She states that she feels the best in the last many months. No unusual shortness of breath or cough. The back pain is improved. Just mild fatigue. No nausea no vomiting no diarrhea no skin rash. Denies any headaches or vision changes.   REVIEW OF SYSTEMS:  A complete 10 point review of system is done which is negative  except mentioned above/history of present illness.   PAST MEDICAL HISTORY :  Past Medical History:  Diagnosis Date  . Anxiety   . Cancer of right lung ([facility]  . Depression   . Depression with anxiety    Well controlled with Wellbutrin and Buspar  . Pneumonia     PAST SURGICAL HISTORY :   Past Surgical History:  Procedure Laterality Date  . ECTOPIC PREGNANCY SURGERY  1988  . ELECTROMAGNETIC NAVIGATION BROCHOSCOPY N/A 02/10/2015   Procedure: ELECTROMAGNETIC NAVIGATION BRONCHOSCOPY;  Surgeon: Deanna Blair;  Location: [facility] ORS;  Service: Cardiopulmonary;  Laterality: N/A;  . ENDOBRONCHIAL ULTRASOUND N/A 02/10/2015   Procedure: ENDOBRONCHIAL ULTRASOUND;  Surgeon: Deanna Blair;  Location: [facility] ORS;  Service: Cardiopulmonary;  Laterality: N/A;  . PERIPHERAL VASCULAR CATHETERIZATION N/A 03/05/2015   Procedure: PoGlori Luisath Insertion;  Surgeon: GrKatha Blair;  [facility];  Service: Cardiovascular;  Laterality: N/A;    FAMILY HISTORY :   Family History  Problem Relation Age of Onset  . Cancer Father     Prostate Cancer  . Hypertension Father   . Stroke Father   . Hypertension Brother     SOCIAL HISTORY:   Social History  Substance Use Topics  . Smoking status: Former Smoker    Packs/day: 0.25    Years: 30.00    Types: Cigarettes    Quit date: 02/06/2005  . Smokeless tobacco: Never Used  . Alcohol use 3.0 oz/week    5 Cans of beer per  week     Comment: 5 beers weekly    ALLERGIES:  has No Known Allergies.  MEDICATIONS:  Current Outpatient Prescriptions  Medication Sig Dispense Refill  . busPIRone (BUSPAR) 5 MG tablet Take 1 tablet (5 mg total) by mouth 2 (two) times daily. 60 tablet 6  . lidocaine-prilocaine (EMLA) cream Apply 1 application topically as needed. 30 g 2  . SPIRIVA RESPIMAT 1.25 MCG/ACT AERS     . SYMBICORT 80-4.5 MCG/ACT inhaler     . zolpidem (AMBIEN) 5 MG tablet Take 1 tablet (5 mg total) by mouth at bedtime as needed for  sleep. 30 tablet 5   No current facility-administered medications for this visit.     PHYSICAL EXAMINATION: ECOG PERFORMANCE STATUS: 0 - Asymptomatic  BP 138/90 (Patient Position: Sitting)   Pulse 68   Temp 97.8 F (36.6 C) (Tympanic)   Ht 5' 2"  (1.575 m)   Wt 130 lb (59 kg)   LMP 06/16/2000 (Approximate) Comment: 15 yrs ago  BMI 23.78 kg/m   Filed Weights   03/31/16 0841  Weight: 130 lb (59 kg)    GENERAL: Well-nourished well-developed; Alert, no distress and comfortable.   She is alone. EYES: no pallor or icterus OROPHARYNX: no thrush or ulceration; good dentition  NECK: supple, no masses felt LYMPH:  no palpable lymphadenopathy in the cervical, axillary or inguinal regions LUNGS: clear to auscultation and  No wheeze or crackles HEART/CVS: regular rate & rhythm and no murmurs; No lower extremity edema ABDOMEN:abdomen soft, non-tender and normal bowel sounds Musculoskeletal:no cyanosis of digits and no clubbing  PSYCH: alert & oriented x 3 with fluent speech NEURO: no focal motor/sensory deficits SKIN:  no rashes or significant lesions  LABORATORY DATA:  I have reviewed the data as listed    Component Value Date/Time   NA 136 03/31/2016 0817   NA 139 01/03/2015 0910   K 3.8 03/31/2016 0817   CL 106 03/31/2016 0817   CO2 24 03/31/2016 0817   GLUCOSE 103 (H) 03/31/2016 0817   BUN 14 03/31/2016 0817   BUN 10 01/03/2015 0910   CREATININE 0.75 03/31/2016 0817   CALCIUM 9.2 03/31/2016 0817   PROT 7.2 03/31/2016 0817   PROT 6.5 01/03/2015 0910   ALBUMIN 4.0 03/31/2016 0817   ALBUMIN 3.8 01/03/2015 0910   AST 35 03/31/2016 0817   ALT 16 03/31/2016 0817   ALKPHOS 58 03/31/2016 0817   BILITOT 0.6 03/31/2016 0817   BILITOT <0.2 01/03/2015 0910   GFRNONAA >60 03/31/2016 0817   GFRAA >60 03/31/2016 0817    No results found for: SPEP, UPEP  Lab Results  Component Value Date   WBC 3.7 03/31/2016   NEUTROABS 2.4 03/31/2016   HGB 12.5 03/31/2016   HCT 34.7 (L)  03/31/2016   MCV 105.2 (H) 03/31/2016   PLT 236 03/31/2016      Chemistry      Component Value Date/Time   NA 136 03/31/2016 0817   NA 139 01/03/2015 0910   K 3.8 03/31/2016 0817   CL 106 03/31/2016 0817   CO2 24 03/31/2016 0817   BUN 14 03/31/2016 0817   BUN 10 01/03/2015 0910   CREATININE 0.75 03/31/2016 0817      Component Value Date/Time   CALCIUM 9.2 03/31/2016 0817   ALKPHOS 58 03/31/2016 0817   AST 35 03/31/2016 0817   ALT 16 03/31/2016 0817   BILITOT 0.6 03/31/2016 0817   BILITOT <0.2 01/03/2015 0910       RADIOGRAPHIC STUDIES:  I have personally reviewed the radiological images as listed and agreed with the findings in the report. No results found.   ASSESSMENT & PLAN:  Primary cancer of right lower lobe of lung ([place]) T4 N2 M1-adenocarcinoma of the lung right side- Jan 19th CT- progression of lung lesions/ ? lymphangiectic spread;  also Brain MRI- shows 2 enhancing lesions in brain [discussion below] Currently opdivo. Tolerating treatment well. No evidence of progression.   #  Continue opdivo cycle #5; Labs today reviewed;  acceptable for treatment today.  Will order CT scan after 6 cycles.   # Brain mets [s/p GK x2- right frontal; left posterior parietal lesions]- again MRI jan 19th - shows these 2 enhancing lesions- improved; will get MRI again in mid April 2018.   # follow up 2 weeks/ labs/MD/treatment;will order scans at next visit;  4 weeks.   No orders of the defined types were placed in this encounter.    Deanna Sickle, MD 03/31/2016 1:32 PM

## 2016-04-13 ENCOUNTER — Telehealth: Payer: Self-pay | Admitting: Physician Assistant

## 2016-04-13 NOTE — Telephone Encounter (Signed)
Advised patient she can make appointment on her own.  ED

## 2016-04-13 NOTE — Telephone Encounter (Signed)
Pt is requesting an order sent to [facility] to have her mammogram.  CB#[phone]/MW

## 2016-04-14 ENCOUNTER — Inpatient Hospital Stay: Payer: BLUE CROSS/BLUE SHIELD

## 2016-04-14 ENCOUNTER — Inpatient Hospital Stay (HOSPITAL_BASED_OUTPATIENT_CLINIC_OR_DEPARTMENT_OTHER): Payer: BLUE CROSS/BLUE SHIELD | Admitting: Internal Medicine

## 2016-04-14 ENCOUNTER — Inpatient Hospital Stay: Payer: BLUE CROSS/BLUE SHIELD | Attending: Internal Medicine

## 2016-04-14 ENCOUNTER — Ambulatory Visit: Payer: BLUE CROSS/BLUE SHIELD

## 2016-04-14 VITALS — BP 127/85 | HR 76 | Temp 97.7°F | Resp 18 | Ht 62.0 in | Wt 131.0 lb

## 2016-04-14 DIAGNOSIS — C7931 Secondary malignant neoplasm of brain: Secondary | ICD-10-CM

## 2016-04-14 DIAGNOSIS — Z8701 Personal history of pneumonia (recurrent): Secondary | ICD-10-CM

## 2016-04-14 DIAGNOSIS — F419 Anxiety disorder, unspecified: Secondary | ICD-10-CM | POA: Insufficient documentation

## 2016-04-14 DIAGNOSIS — C3431 Malignant neoplasm of lower lobe, right bronchus or lung: Secondary | ICD-10-CM

## 2016-04-14 DIAGNOSIS — Z87891 Personal history of nicotine dependence: Secondary | ICD-10-CM | POA: Diagnosis not present

## 2016-04-14 DIAGNOSIS — Z801 Family history of malignant neoplasm of trachea, bronchus and lung: Secondary | ICD-10-CM | POA: Diagnosis not present

## 2016-04-14 DIAGNOSIS — Z8041 Family history of malignant neoplasm of ovary: Secondary | ICD-10-CM | POA: Diagnosis not present

## 2016-04-14 DIAGNOSIS — Z5112 Encounter for antineoplastic immunotherapy: Secondary | ICD-10-CM | POA: Diagnosis not present

## 2016-04-14 DIAGNOSIS — F329 Major depressive disorder, single episode, unspecified: Secondary | ICD-10-CM | POA: Insufficient documentation

## 2016-04-14 DIAGNOSIS — Z5111 Encounter for antineoplastic chemotherapy: Secondary | ICD-10-CM | POA: Insufficient documentation

## 2016-04-14 LAB — CBC WITH DIFFERENTIAL/PLATELET
BASOS ABS: 0 10*3/uL (ref 0–0.1)
Basophils Relative: 1 %
EOS ABS: 0.1 10*3/uL (ref 0–0.7)
EOS PCT: 2 %
HCT: 36.3 % (ref 35.0–47.0)
Hemoglobin: 12.7 g/dL (ref 12.0–16.0)
LYMPHS ABS: 0.8 10*3/uL — AB (ref 1.0–3.6)
Lymphocytes Relative: 15 %
MCH: 36.9 pg — AB (ref 26.0–34.0)
MCHC: 35.1 g/dL (ref 32.0–36.0)
MCV: 105.3 fL — ABNORMAL HIGH (ref 80.0–100.0)
Monocytes Absolute: 0.4 10*3/uL (ref 0.2–0.9)
Monocytes Relative: 8 %
Neutro Abs: 3.7 10*3/uL (ref 1.4–6.5)
Neutrophils Relative %: 74 %
PLATELETS: 264 10*3/uL (ref 150–440)
RBC: 3.45 MIL/uL — AB (ref 3.80–5.20)
RDW: 12.7 % (ref 11.5–14.5)
WBC: 5.1 10*3/uL (ref 3.6–11.0)

## 2016-04-14 LAB — COMPREHENSIVE METABOLIC PANEL
ALT: 20 U/L (ref 14–54)
AST: 36 U/L (ref 15–41)
Albumin: 4.3 g/dL (ref 3.5–5.0)
Alkaline Phosphatase: 64 U/L (ref 38–126)
Anion gap: 10 (ref 5–15)
BUN: 16 mg/dL (ref 6–20)
CHLORIDE: 103 mmol/L (ref 101–111)
CO2: 21 mmol/L — AB (ref 22–32)
CREATININE: 0.71 mg/dL (ref 0.44–1.00)
Calcium: 9.4 mg/dL (ref 8.9–10.3)
GFR calc non Af Amer: >60 mL/min (ref >60)
Glucose, Bld: 105 mg/dL — ABNORMAL HIGH (ref 65–99)
Potassium: 3.5 mmol/L (ref 3.5–5.1)
SODIUM: 134 mmol/L — AB (ref 135–145)
Total Bilirubin: 0.6 mg/dL (ref 0.3–1.2)
Total Protein: 7.5 g/dL (ref 6.5–8.1)

## 2016-04-14 MED ORDER — SODIUM CHLORIDE 0.9% FLUSH
10.0000 mL | INTRAVENOUS | Status: DC | PRN
  Administered 2016-04-14: 10 mL
  Filled 2016-04-14: qty 10

## 2016-04-14 MED ORDER — HEPARIN SOD (PORK) LOCK FLUSH 100 UNIT/ML IV SOLN
500.0000 [IU] | Freq: Once | INTRAVENOUS | Status: AC | PRN
  Administered 2016-04-14: 500 [IU]
  Filled 2016-04-14: qty 5

## 2016-04-14 MED ORDER — SODIUM CHLORIDE 0.9 % IV SOLN
240.0000 mg | Freq: Once | INTRAVENOUS | Status: AC
  Administered 2016-04-14: 240 mg via INTRAVENOUS
  Filled 2016-04-14: qty 24

## 2016-04-14 MED ORDER — SODIUM CHLORIDE 0.9 % IV SOLN
Freq: Once | INTRAVENOUS | Status: AC
  Administered 2016-04-14: 12:00:00 via INTRAVENOUS
  Filled 2016-04-14: qty 1000

## 2016-04-14 NOTE — Progress Notes (Signed)
Letter written today per pt's request- for disability-to move her return to work date to July 14, 2016.

## 2016-04-14 NOTE — Progress Notes (Signed)
[place] OFFICE PROGRESS NOTE  Patient Care Team: Mar Daring, PA-C as PCP - General (Family Medicine) Allyne Gee, MD as Referring Physician (Internal Medicine)  Malignant neoplasm of trachea, bronchus, and lung [facility]   Staging form: Lung, AJCC 7th Edition     Clinical: Stage IIIB (T4, N2, M0) - Signed by Cammie Sickle, MD on 06/24/2015    Oncology History   # FEB 2017-[place] CA Right Lower Lung T4N2M0- STAGE IIIB [Bronch & Subcarinal LNBx]- March 8th START CARBO-ALIMTA x4 cycles- June 8th 2017 - IMPROVED FDG MULTI-FOCAL lesions/N2-LN activity. S/p [place]-- Alimta x6  # AUG 18th- Alimta- Avastin maintenance; SEP 13th DISCONTINUE AVASTIN [sec to cavitary lesion]; OCT 20th 2017- CT-STABLE Disease; NOV 8th CT/PET [facility]]- "overall slight progression-? Lymphangiectatic spread"  # NOV 2nd 2017-MRI, [facility]- Brain mets [asymptomatic;Right frontal ~43m; ~319mlesions -appx 3-4 lesions; (right Frontal & Left parietal s/p GK x 2 lesions]   # [facility] [Dr.Leventakos; [phone]/QQPY] MOLECULAR STUDIES:  KRAS MUTATED/Tissues not sufficient for OTHER the molecular marker; will need repeat Bx ; GUARDIANT TESTING [[facility]]- pending.      [place] lobe of lung ([place]  10/08/2015 Initial Diagnosis    Primary cancer of right lower lobe of lung (HCC)       INTERVAL HISTORY:  WaDalinda Heidt23.o.  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 She is s/p cycle #5 of opdivo.  No fatigue. No unusual shortness of breath or cough. The back pain is improved. No nausea no vomiting no diarrhea no skin rash. Denies any headaches or vision changes.   REVIEW OF SYSTEMS:  A complete 10 point review of system is done which is negative except mentioned above/history of present illness.   PAST MEDICAL  HISTORY :  Past Medical History:  Diagnosis Date  . Anxiety   . Cancer of right lung ([facility]/[place]2015  . Depression   . Depression with anxiety    Well controlled with Wellbutrin and Buspar  . Pneumonia     PAST SURGICAL HISTORY :   Past Surgical History:  Procedure Laterality Date  . ECTOPIC PREGNANCY SURGERY  1988  . ELECTROMAGNETIC NAVIGATION BROCHOSCOPY N/A 02/10/2015   Procedure: ELECTROMAGNETIC NAVIGATION BRONCHOSCOPY;  Surgeon: KuFlora LippsMD;  Location: [facility] ORS;  Service: Cardiopulmonary;  Laterality: N/A;  . ENDOBRONCHIAL ULTRASOUND N/A 02/10/2015   Procedure: ENDOBRONCHIAL ULTRASOUND;  Surgeon: KuFlora LippsMD;  Location: [facility] ORS;  Service: Cardiopulmonary;  Laterality: N/A;  . PERIPHERAL VASCULAR CATHETERIZATION N/A 03/05/2015   Procedure: PoGlori Luisath Insertion;  Surgeon: GrKatha CabalMD;  [facility];  Service: Cardiovascular;  Laterality: N/A;    FAMILY HISTORY :   Family History  Problem Relation Age of Onset  . Cancer Father     Prostate Cancer  . Hypertension Father   . Stroke Father   . Hypertension Brother     SOCIAL HISTORY:   Social History  Substance Use Topics  . Smoking status: Former Smoker    Packs/day: 0.25    Years: 30.00    Types: Cigarettes    Quit date: 02/06/2005  . Smokeless tobacco: Never Used  . Alcohol use 3.0 oz/week    5 Cans of beer per week     Comment: 5 beers weekly    ALLERGIES:  has No Known Allergies.  MEDICATIONS:  Current Outpatient Prescriptions  Medication Sig Dispense Refill  . busPIRone (BUSPAR) 5 MG tablet Take 1 tablet (5 mg total) by mouth 2 (two) times daily. 60 tablet 6  . lidocaine-prilocaine (EMLA) cream Apply 1 application topically as needed. 30 g 2  . SPIRIVA RESPIMAT 1.25 MCG/ACT AERS     . SYMBICORT 80-4.5 MCG/ACT inhaler     . zolpidem (AMBIEN) 5 MG tablet Take 1 tablet (5 mg total) by mouth at bedtime as needed for sleep. 30 tablet 5   No current facility-administered medications  for this visit.    Facility-Administered Medications Ordered in Other Visits  Medication Dose Route Frequency Provider Last Rate Last Dose  . sodium chloride flush (NS) 0.9 % injection 10 mL  10 mL Intracatheter PRN Cammie Sickle, MD   10 mL at 04/14/16 1010    PHYSICAL EXAMINATION: ECOG PERFORMANCE STATUS: 0 - Asymptomatic  BP 127/85 (BP Location: Right Arm, Patient Position: Sitting)   Pulse 76   Temp 97.7 F (36.5 C) (Tympanic)   Resp 18   Ht 5' 2"  (1.575 m)   Wt 131 lb (59.4 kg)   LMP 06/16/2000 (Approximate) Comment: 15 yrs ago  BMI 23.96 kg/m   Filed Weights   04/14/16 1043  Weight: 131 lb (59.4 kg)    GENERAL: Well-nourished well-developed; Alert, no distress and comfortable.   She is alone. EYES: no pallor or icterus OROPHARYNX: no thrush or ulceration; good dentition  NECK: supple, no masses felt LYMPH:  no palpable lymphadenopathy in the cervical, axillary or inguinal regions LUNGS: clear to auscultation and  No wheeze or crackles HEART/CVS: regular rate & rhythm and no murmurs; No lower extremity edema ABDOMEN:abdomen soft, non-tender and normal bowel sounds Musculoskeletal:no cyanosis of digits and no clubbing  PSYCH: alert & oriented x 3 with fluent speech NEURO: no focal motor/sensory deficits SKIN:  no rashes or significant lesions  LABORATORY DATA:  I have reviewed the data as listed    Component Value Date/Time   NA 134 (L) 04/14/2016 1004   NA 139 01/03/2015 0910   K 3.5 04/14/2016 1004   CL 103 04/14/2016 1004   CO2 21 (L) 04/14/2016 1004   GLUCOSE 105 (H) 04/14/2016 1004   BUN 16 04/14/2016 1004   BUN 10 01/03/2015 0910   CREATININE 0.71 04/14/2016 1004   CALCIUM 9.4 04/14/2016 1004   PROT 7.5 04/14/2016 1004   PROT 6.5 01/03/2015 0910   ALBUMIN 4.3 04/14/2016 1004   ALBUMIN 3.8 01/03/2015 0910   AST 36 04/14/2016 1004   ALT 20 04/14/2016 1004   ALKPHOS 64 04/14/2016 1004   BILITOT 0.6 04/14/2016 1004   BILITOT <0.2 01/03/2015  0910   GFRNONAA >60 04/14/2016 1004   GFRAA >60 04/14/2016 1004    No results found for: SPEP, UPEP  Lab Results  Component Value Date   WBC 5.1 04/14/2016   NEUTROABS 3.7 04/14/2016   HGB 12.7 04/14/2016   HCT 36.3 04/14/2016   MCV 105.3 (H) 04/14/2016   PLT 264 04/14/2016      Chemistry      Component Value Date/Time   NA 134 (L) 04/14/2016 1004   NA 139 01/03/2015 0910   K 3.5 04/14/2016 1004   CL 103 04/14/2016 1004   CO2 21 (L) 04/14/2016 1004   BUN 16 04/14/2016 1004   BUN 10 01/03/2015 0910   CREATININE 0.71 04/14/2016 1004      Component Value Date/Time  CALCIUM 9.4 04/14/2016 1004   ALKPHOS 64 04/14/2016 1004   AST 36 04/14/2016 1004   ALT 20 04/14/2016 1004   BILITOT 0.6 04/14/2016 1004   BILITOT <0.2 01/03/2015 0910       RADIOGRAPHIC STUDIES: I have personally reviewed the radiological images as listed and agreed with the findings in the report. No results found.   ASSESSMENT & PLAN:  Primary cancer of right lower lobe of lung ([place]) T4 N2 M1-adenocarcinoma of the lung right side- Jan 19th CT- progression of lung lesions/ ? lymphangiectic spread;  also Brain MRI- shows 2 enhancing lesions in brain [discussion below] Currently opdivo. Tolerating treatment well. No evidence of progression.   #  Continue opdivo cycle #6 ; Labs today reviewed;  acceptable for treatment today. CT ordered today.   # Brain mets [s/p GK x2- right frontal; left posterior parietal lesions; Nov 2017; at [facility]MRI jan 19th - shows these 2 enhancing lesions- improved; will order scans again today.    # follow up 2 weeks/ labs/MD/treatment;scans prior to 15th [sec insurance];  4 weeks.   Orders Placed This Encounter  Procedures  . CT CHEST W CONTRAST    Standing Status:   Future    Standing Expiration Date:   06/14/2017    Order Specific Question:   Reason for Exam (SYMPTOM  OR DIAGNOSIS REQUIRED)    Answer:   lung cancer    Order Specific Question:   Is the patient  pregnant?    Answer:   No    Order Specific Question:   Preferred imaging location?    Answer:   [facility]  . MR BRAIN W WO CONTRAST    Standing Status:   Future    Standing Expiration Date:   06/14/2017    Order Specific Question:   Reason for Exam (SYMPTOM  OR DIAGNOSIS REQUIRED)    Answer:   lung cancer    Order Specific Question:   Preferred imaging location?    Answer:   [facility]    Order Specific Question:   Does the patient have a pacemaker or implanted devices?    Answer:   No    Order Specific Question:   What is the patient's sedation requirement?    Answer:   No Sedation  . CBC with Differential/Platelet    Standing Status:   Standing    Number of Occurrences:   5    Standing Expiration Date:   04/14/2017  . Comprehensive metabolic panel    Standing Status:   Standing    Number of Occurrences:   5    Standing Expiration Date:   04/14/2017     Cammie Sickle, MD 04/14/2016 1:40 PM

## 2016-04-14 NOTE — Assessment & Plan Note (Addendum)
T4 N2 M1-adenocarcinoma of the lung right side- Jan 19th CT- progression of lung lesions/ ? lymphangiectic spread;  also Brain MRI- shows 2 enhancing lesions in brain [discussion below] Currently opdivo. Tolerating treatment well. No evidence of progression.   #  Continue opdivo cycle #6 ; Labs today reviewed;  acceptable for treatment today. CT ordered today.   # Brain mets [s/p GK x2- right frontal; left posterior parietal lesions; Nov 2017; at [facility]]-MRI jan 19th - shows these 2 enhancing lesions- improved; will order scans again today.    # follow up 2 weeks/ labs/MD/treatment;scans prior to 15th [sec insurance];  4 weeks.

## 2016-04-16 ENCOUNTER — Ambulatory Visit
Admission: RE | Admit: 2016-04-16 | Discharge: 2016-04-16 | Disposition: A | Discharge status: Home / Self Care | Payer: BLUE CROSS/BLUE SHIELD | Source: Ambulatory Visit | Attending: Internal Medicine | Admitting: Internal Medicine

## 2016-04-16 ENCOUNTER — Other Ambulatory Visit: Payer: BLUE CROSS/BLUE SHIELD

## 2016-04-16 ENCOUNTER — Ambulatory Visit: Payer: BLUE CROSS/BLUE SHIELD | Admitting: Internal Medicine

## 2016-04-16 ENCOUNTER — Ambulatory Visit: Payer: BLUE CROSS/BLUE SHIELD

## 2016-04-16 DIAGNOSIS — C3431 Malignant neoplasm of lower lobe, right bronchus or lung: Secondary | ICD-10-CM

## 2016-04-16 DIAGNOSIS — C7931 Secondary malignant neoplasm of brain: Secondary | ICD-10-CM | POA: Insufficient documentation

## 2016-04-16 DIAGNOSIS — I700 Atherosclerosis of aorta: Secondary | ICD-10-CM | POA: Diagnosis not present

## 2016-04-16 MED ORDER — IOPAMIDOL (ISOVUE-300) INJECTION 61%
75.0000 mL | Freq: Once | INTRAVENOUS | Status: AC | PRN
  Administered 2016-04-16: 75 mL via INTRAVENOUS

## 2016-04-16 MED ORDER — GADOBENATE DIMEGLUMINE 529 MG/ML IV SOLN
12.0000 mL | Freq: Once | INTRAVENOUS | Status: AC | PRN
  Administered 2016-04-16: 12 mL via INTRAVENOUS

## 2016-04-21 ENCOUNTER — Telehealth: Payer: Self-pay | Admitting: *Deleted

## 2016-04-21 ENCOUNTER — Inpatient Hospital Stay (HOSPITAL_BASED_OUTPATIENT_CLINIC_OR_DEPARTMENT_OTHER): Payer: BLUE CROSS/BLUE SHIELD | Admitting: Internal Medicine

## 2016-04-21 ENCOUNTER — Other Ambulatory Visit: Payer: Self-pay | Admitting: Internal Medicine

## 2016-04-21 VITALS — BP 136/85 | HR 71 | Temp 97.4°F | Resp 16 | Wt 132.0 lb

## 2016-04-21 DIAGNOSIS — F329 Major depressive disorder, single episode, unspecified: Secondary | ICD-10-CM | POA: Diagnosis not present

## 2016-04-21 DIAGNOSIS — C7931 Secondary malignant neoplasm of brain: Secondary | ICD-10-CM

## 2016-04-21 DIAGNOSIS — Z87891 Personal history of nicotine dependence: Secondary | ICD-10-CM

## 2016-04-21 DIAGNOSIS — C3431 Malignant neoplasm of lower lobe, right bronchus or lung: Secondary | ICD-10-CM

## 2016-04-21 DIAGNOSIS — F419 Anxiety disorder, unspecified: Secondary | ICD-10-CM | POA: Diagnosis not present

## 2016-04-21 DIAGNOSIS — Z8701 Personal history of pneumonia (recurrent): Secondary | ICD-10-CM | POA: Diagnosis not present

## 2016-04-21 DIAGNOSIS — Z801 Family history of malignant neoplasm of trachea, bronchus and lung: Secondary | ICD-10-CM | POA: Diagnosis not present

## 2016-04-21 DIAGNOSIS — Z8041 Family history of malignant neoplasm of ovary: Secondary | ICD-10-CM

## 2016-04-21 NOTE — Progress Notes (Signed)
DISCONTINUE ON PATHWAY REGIMEN - Non-Small Cell Lung     A cycle is every 14 days:     Nivolumab        Dose Mod: None  **Always confirm dose/schedule in your pharmacy ordering system**    REASON: Disease Progression PRIOR TREATMENT: [id number]: Nivolumab 240 mg q14 Days Until Progression or Unacceptable Toxicity TREATMENT RESPONSE: Progressive Disease (PD)  START ON PATHWAY REGIMEN - Non-Small Cell Lung     A cycle is every 21 days:     Paclitaxel      Carboplatin   **Always confirm dose/schedule in your pharmacy ordering system**    Patient Characteristics: Stage IV Metastatic, Non Squamous, Initial Chemotherapy/Immunotherapy, PS = 0, 1, PD-L1 Expression Positive 1-49% (TPS) / Negative / Not Tested / Not a Candidate for Immunotherapy AJCC T Category: T4 Current Disease Status: Distant Metastases AJCC N Category: N2 AJCC M Category: M1a AJCC 8 Stage Grouping: IVA Histology: Non Squamous Cell ROS1 Rearrangement Status: Quantity Not Sufficient T790M Mutation Status: Not Applicable - EGFR Mutation Negative/Unknown Other Mutations/Biomarkers: No Other Actionable Mutations PD-L1 Expression Status: Quantity Not Sufficient Chemotherapy/Immunotherapy LOT: Initial Chemotherapy/Immunotherapy Molecular Targeted Therapy: Not Appropriate ALK Translocation Status: Quantity Not Sufficient Would you be surprised if this patient died  in the next year? I would be surprised if this patient died in the next year EGFR Mutation Status: Quantity Not Sufficient BRAF V600E Mutation Status: Quantity Not Sufficient Performance Status: PS = 0, 1  Intent of Therapy: Non-Curative / Palliative Intent, Discussed with Patient

## 2016-04-21 NOTE — Telephone Encounter (Signed)
Spoke with patient- asked patient to come to cancer center at 2:15 pm today to discuss her test results.  Pt agreed to this apt time.

## 2016-04-21 NOTE — Progress Notes (Signed)
[place] OFFICE PROGRESS NOTE  Patient Care Team: Mar Daring, PA-C as PCP - General (Family Medicine) Allyne Gee, MD as Referring Physician (Internal Medicine)  Malignant neoplasm of trachea, bronchus, and lung [facility])   Staging form: Lung, AJCC 7th Edition     Clinical: Stage IIIB (T4, N2, M0) - Signed by Cammie Sickle, MD on 06/24/2015    Oncology History   # FEB 2017-[place] Right Lower Lung T4N2M0- STAGE IIIB [Bronch & Subcarinal LNBx]- March 8th START CARBO-ALIMTA x4 cycles- June 8th 2017 - IMPROVED FDG MULTI-FOCAL lesions/N2-LN activity. S/p [place]-- Alimta x6  # AUG 18th- [place]- Avastin maintenance; SEP 13th DISCONTINUE AVASTIN [sec to cavitary lesion]; OCT 20th 2017- CT-STABLE Disease; NOV 8th CT/PET [facility] clinic]- "overall slight progression-? Lymphangiectatic spread"  # NOV 2nd 2017-MRI, [facility]- Brain mets [asymptomatic;Right frontal ~83m; ~350mlesions -appx 3-4 lesions; (right Frontal & Left parietal s/p GK x 2 lesions]   # [facility] [Dr.Leventakos; [phone]/DGLO] MOLECULAR STUDIES:  KRAS MUTATED/Tissues not sufficient for OTHER the molecular marker; will need repeat Bx ; GUARDIANT TESTING [[facility]]- pending.      [place] of right lower lobe of lung ([place]    INTERVAL HISTORY:  [name]73.o.  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   No fatigue. No unusual shortness of breath or cough. No nausea no vomiting no diarrhea no skin rash. Denies any headaches or vision changes.   REVIEW OF SYSTEMS:  A complete 10 point review of system is done which is negative except mentioned above/history of present illness.   PAST MEDICAL HISTORY :  Past Medical History:  Diagnosis Date  . Anxiety   . Cancer of right  lung (HCBon[facility] [place]2/09/2015  . Depression   . Depression with anxiety    Well controlled with Wellbutrin and Buspar  . Pneumonia     PAST SURGICAL HISTORY :   Past Surgical History:  Procedure Laterality Date  . ECTOPIC PREGNANCY SURGERY  1988  . ELECTROMAGNETIC NAVIGATION BROCHOSCOPY N/A 02/10/2015   Procedure: ELECTROMAGNETIC NAVIGATION BRONCHOSCOPY;  Surgeon: KuFlora LippsMD;  Location: [facility] ORS;  Service: Cardiopulmonary;  Laterality: N/A;  . ENDOBRONCHIAL ULTRASOUND N/A 02/10/2015   Procedure: ENDOBRONCHIAL ULTRASOUND;  Surgeon: KuFlora LippsMD;  Location: [facility] ORS;  Service: Cardiopulmonary;  Laterality: N/A;  . PERIPHERAL VASCULAR CATHETERIZATION N/A 03/05/2015   Procedure: PoGlori Luisath Insertion;  Surgeon: GrKatha CabalMD;  [facility];  Service: Cardiovascular;  Laterality: N/A;    FAMILY HISTORY :   Family History  Problem Relation Age of Onset  . Cancer Father     Prostate Cancer  . Hypertension Father   . Stroke Father   . Hypertension Brother     SOCIAL HISTORY:   Social History  Substance Use Topics  . Smoking status: Former Smoker    Packs/day: 0.25    Years: 30.00    Types: Cigarettes    Quit date: 02/06/2005  . Smokeless tobacco: Never Used  . Alcohol use 3.0 oz/week    5 Cans of beer per week     Comment: 5 beers weekly    ALLERGIES:  has No Known Allergies.  MEDICATIONS:  Current Outpatient Prescriptions  Medication Sig Dispense Refill  . busPIRone (  BUSPAR) 5 MG tablet Take 1 tablet (5 mg total) by mouth 2 (two) times daily. 60 tablet 6  . lidocaine-prilocaine (EMLA) cream Apply 1 application topically as needed. 30 g 2  . SPIRIVA RESPIMAT 1.25 MCG/ACT AERS     . SYMBICORT 80-4.5 MCG/ACT inhaler     . zolpidem (AMBIEN) 5 MG tablet Take 1 tablet (5 mg total) by mouth at bedtime as needed for sleep. 30 tablet 5   No current facility-administered medications for this visit.     PHYSICAL EXAMINATION: ECOG PERFORMANCE STATUS: 0 -  Asymptomatic  BP 136/85 (BP Location: Right Arm, Patient Position: Sitting)   Pulse 71   Temp 97.4 F (36.3 C) (Tympanic)   Resp 16   Wt 132 lb (59.9 kg)   LMP 06/16/2000 (Approximate) Comment: 15 yrs ago  BMI 24.14 kg/m   Filed Weights   04/21/16 1412  Weight: 132 lb (59.9 kg)    GENERAL: Well-nourished well-developed; Alert, no distress and comfortable.   She is alone. EYES: no pallor or icterus OROPHARYNX: no thrush or ulceration; good dentition  NECK: supple, no masses felt LYMPH:  no palpable lymphadenopathy in the cervical, axillary or inguinal regions LUNGS: clear to auscultation and  No wheeze or crackles HEART/CVS: regular rate & rhythm and no murmurs; No lower extremity edema ABDOMEN:abdomen soft, non-tender and normal bowel sounds Musculoskeletal:no cyanosis of digits and no clubbing  PSYCH: alert & oriented x 3 with fluent speech; she is emotional distressed given the results of the scan NEURO: no focal motor/sensory deficits SKIN:  no rashes or significant lesions  LABORATORY DATA:  I have reviewed the data as listed    Component Value Date/Time   NA 134 (L) 04/14/2016 1004   NA 139 01/03/2015 0910   K 3.5 04/14/2016 1004   CL 103 04/14/2016 1004   CO2 21 (L) 04/14/2016 1004   GLUCOSE 105 (H) 04/14/2016 1004   BUN 16 04/14/2016 1004   BUN 10 01/03/2015 0910   CREATININE 0.71 04/14/2016 1004   CALCIUM 9.4 04/14/2016 1004   PROT 7.5 04/14/2016 1004   PROT 6.5 01/03/2015 0910   ALBUMIN 4.3 04/14/2016 1004   ALBUMIN 3.8 01/03/2015 0910   AST 36 04/14/2016 1004   ALT 20 04/14/2016 1004   ALKPHOS 64 04/14/2016 1004   BILITOT 0.6 04/14/2016 1004   BILITOT <0.2 01/03/2015 0910   GFRNONAA >60 04/14/2016 1004   GFRAA >60 04/14/2016 1004    No results found for: SPEP, UPEP  Lab Results  Component Value Date   WBC 5.1 04/14/2016   NEUTROABS 3.7 04/14/2016   HGB 12.7 04/14/2016   HCT 36.3 04/14/2016   MCV 105.3 (H) 04/14/2016   PLT 264 04/14/2016       Chemistry      Component Value Date/Time   NA 134 (L) 04/14/2016 1004   NA 139 01/03/2015 0910   K 3.5 04/14/2016 1004   CL 103 04/14/2016 1004   CO2 21 (L) 04/14/2016 1004   BUN 16 04/14/2016 1004   BUN 10 01/03/2015 0910   CREATININE 0.71 04/14/2016 1004      Component Value Date/Time   CALCIUM 9.4 04/14/2016 1004   ALKPHOS 64 04/14/2016 1004   AST 36 04/14/2016 1004   ALT 20 04/14/2016 1004   BILITOT 0.6 04/14/2016 1004   BILITOT <0.2 01/03/2015 0910       RADIOGRAPHIC STUDIES: I have personally reviewed the radiological images as listed and agreed with the findings in the report. No  results found.   ASSESSMENT & PLAN:  Primary cancer of right lower lobe of lung ([place]) T4 N2 M1-adenocarcinoma of the lung right side- status post 6 cycles of Opdivo- restaging CAT scan shows progression of the disease multifocal right-sided lung cancer with lymphangitic spread.Also Brain MRI- shows 2 enhancing lesions in brain [discussion below]   # Given the progression noted on CT scan- I would recommend discontinuation of Opdivo at this time. I would recommend carbotaxol chemotherapy for 4-6 cycles. Discussed regarding clinical trial options; she will check with the insurance if [facility] or [facility] would be an option. Also discussed that would recommend reimaging at the tumor conference; and also discussed the need for repeating the biopsy.  # Discussed the potential side effects including but not limited to-increasing fatigue, nausea vomiting, diarrhea, hair loss, sores in the mouth, increase risk of infection and also neuropathy.   # Growth factor-Neulasta/On pro would be given as prophylaxis for chemotherapy-induced neutropenia to prevent febrile neutropenias.    # Brain mets [s/p GK x2- right frontal; left posterior parietal lesions; Nov 2017; at [facility] clinic]-MRI April 17th- essentially stable lesions; 2 new punctate mets. Monitor for now.   # follow up as planned next week for  carbo-taxol; onpr/labs/MD.   # # Reviewed/counselled regarding the goals of care- being palliative/treatment are usually indefinite-until progression or side effects. Goal is to maintain quality of life as the disease is incurable.   # I reviewed the blood work- with the patient in detail; also reviewed the imaging independently [as summarized above]; and with the patient in detail.   No orders of the defined types were placed in this encounter.    Cammie Sickle, MD 04/21/2016 7:56 PM

## 2016-04-21 NOTE — Assessment & Plan Note (Addendum)
T4 N2 M1-adenocarcinoma of the lung right side- status post 6 cycles of Opdivo- restaging CAT scan shows progression of the disease multifocal right-sided lung cancer with lymphangitic spread.Also Brain MRI- shows 2 enhancing lesions in brain [discussion below]   # Given the progression noted on CT scan- I would recommend discontinuation of Opdivo at this time. I would recommend carbotaxol chemotherapy for 4-6 cycles. Discussed regarding clinical trial options; she will check with the insurance if [facility] or [facility] would be an option. Also discussed that would recommend reimaging at the tumor conference; and also discussed the need for repeating the biopsy.  # Discussed the potential side effects including but not limited to-increasing fatigue, nausea vomiting, diarrhea, hair loss, sores in the mouth, increase risk of infection and also neuropathy.   # Growth factor-Neulasta/On pro would be given as prophylaxis for chemotherapy-induced neutropenia to prevent febrile neutropenias.    # Brain mets [s/p GK x2- right frontal; left posterior parietal lesions; Nov 2017; at [facility] clinic]-MRI April 17th- essentially stable lesions; 2 new punctate mets. Monitor for now.   # follow up as planned next week for carbo-taxol; onpr/labs/MD.   # # Reviewed/counselled regarding the goals of care- being palliative/treatment are usually indefinite-until progression or side effects. Goal is to maintain quality of life as the disease is incurable.   # I reviewed the blood work- with the patient in detail; also reviewed the imaging independently [as summarized above]; and with the patient in detail.

## 2016-04-21 NOTE — Progress Notes (Signed)
Patient here today for results of recent MRI

## 2016-04-28 ENCOUNTER — Inpatient Hospital Stay: Payer: BLUE CROSS/BLUE SHIELD

## 2016-04-28 ENCOUNTER — Inpatient Hospital Stay (HOSPITAL_BASED_OUTPATIENT_CLINIC_OR_DEPARTMENT_OTHER): Payer: BLUE CROSS/BLUE SHIELD | Admitting: Internal Medicine

## 2016-04-28 VITALS — BP 149/84 | HR 66 | Temp 97.9°F | Resp 16 | Wt 134.5 lb

## 2016-04-28 VITALS — BP 125/80 | HR 64 | Resp 20

## 2016-04-28 DIAGNOSIS — C7931 Secondary malignant neoplasm of brain: Secondary | ICD-10-CM

## 2016-04-28 DIAGNOSIS — F419 Anxiety disorder, unspecified: Secondary | ICD-10-CM | POA: Diagnosis not present

## 2016-04-28 DIAGNOSIS — Z87891 Personal history of nicotine dependence: Secondary | ICD-10-CM

## 2016-04-28 DIAGNOSIS — Z8701 Personal history of pneumonia (recurrent): Secondary | ICD-10-CM

## 2016-04-28 DIAGNOSIS — C3431 Malignant neoplasm of lower lobe, right bronchus or lung: Secondary | ICD-10-CM

## 2016-04-28 DIAGNOSIS — Z8041 Family history of malignant neoplasm of ovary: Secondary | ICD-10-CM | POA: Diagnosis not present

## 2016-04-28 DIAGNOSIS — F329 Major depressive disorder, single episode, unspecified: Secondary | ICD-10-CM | POA: Diagnosis not present

## 2016-04-28 DIAGNOSIS — Z801 Family history of malignant neoplasm of trachea, bronchus and lung: Secondary | ICD-10-CM

## 2016-04-28 LAB — COMPREHENSIVE METABOLIC PANEL
ALT: 17 U/L (ref 14–54)
AST: 32 U/L (ref 15–41)
Albumin: 4 g/dL (ref 3.5–5.0)
Alkaline Phosphatase: 75 U/L (ref 38–126)
Anion gap: 6 (ref 5–15)
BUN: 16 mg/dL (ref 6–20)
CHLORIDE: 109 mmol/L (ref 101–111)
CO2: 24 mmol/L (ref 22–32)
Calcium: 9.3 mg/dL (ref 8.9–10.3)
Creatinine, Ser: 0.81 mg/dL (ref 0.44–1.00)
GFR calc non Af Amer: >60 mL/min (ref >60)
Glucose, Bld: 100 mg/dL — ABNORMAL HIGH (ref 65–99)
POTASSIUM: 3.9 mmol/L (ref 3.5–5.1)
SODIUM: 139 mmol/L (ref 135–145)
Total Bilirubin: 0.5 mg/dL (ref 0.3–1.2)
Total Protein: 7 g/dL (ref 6.5–8.1)

## 2016-04-28 LAB — CBC WITH DIFFERENTIAL/PLATELET
Basophils Absolute: 0 10*3/uL (ref 0–0.1)
Basophils Relative: 1 %
Eosinophils Absolute: 0.1 10*3/uL (ref 0–0.7)
Eosinophils Relative: 3 %
HEMATOCRIT: 34 % — AB (ref 35.0–47.0)
HEMOGLOBIN: 12.1 g/dL (ref 12.0–16.0)
Lymphocytes Relative: 19 %
Lymphs Abs: 0.8 10*3/uL — ABNORMAL LOW (ref 1.0–3.6)
MCH: 37.1 pg — ABNORMAL HIGH (ref 26.0–34.0)
MCHC: 35.5 g/dL (ref 32.0–36.0)
MCV: 104.7 fL — AB (ref 80.0–100.0)
Monocytes Absolute: 0.5 10*3/uL (ref 0.2–0.9)
Monocytes Relative: 10 %
NEUTROS ABS: 3 10*3/uL (ref 1.4–6.5)
Neutrophils Relative %: 67 %
PLATELETS: 229 10*3/uL (ref 150–440)
RBC: 3.25 MIL/uL — AB (ref 3.80–5.20)
RDW: 12.1 % (ref 11.5–14.5)
WBC: 4.4 10*3/uL (ref 3.6–11.0)

## 2016-04-28 MED ORDER — PALONOSETRON HCL INJECTION 0.25 MG/5ML
0.2500 mg | Freq: Once | INTRAVENOUS | Status: AC
  Administered 2016-04-28: 0.25 mg via INTRAVENOUS
  Filled 2016-04-28: qty 5

## 2016-04-28 MED ORDER — HEPARIN SOD (PORK) LOCK FLUSH 100 UNIT/ML IV SOLN: 500.0000 [IU] | Freq: Once | INTRAVENOUS | Status: DC | PRN

## 2016-04-28 MED ORDER — PEGFILGRASTIM 6 MG/0.6ML ~~LOC~~ PSKT
6.0000 mg | PREFILLED_SYRINGE | Freq: Once | SUBCUTANEOUS | Status: AC
  Administered 2016-04-28: 6 mg via SUBCUTANEOUS
  Filled 2016-04-28: qty 0.6

## 2016-04-28 MED ORDER — SODIUM CHLORIDE 0.9 % IV SOLN
Freq: Once | INTRAVENOUS | Status: AC
  Administered 2016-04-28: 09:00:00 via INTRAVENOUS
  Filled 2016-04-28: qty 1000

## 2016-04-28 MED ORDER — FAMOTIDINE IN NACL 20-0.9 MG/50ML-% IV SOLN
20.0000 mg | Freq: Once | INTRAVENOUS | Status: AC
  Administered 2016-04-28: 20 mg via INTRAVENOUS
  Filled 2016-04-28: qty 50

## 2016-04-28 MED ORDER — SODIUM CHLORIDE 0.9 % IV SOLN
570.0000 mg | Freq: Once | INTRAVENOUS | Status: AC
  Administered 2016-04-28: 570 mg via INTRAVENOUS
  Filled 2016-04-28: qty 57

## 2016-04-28 MED ORDER — SODIUM CHLORIDE 0.9% FLUSH
10.0000 mL | INTRAVENOUS | Status: DC | PRN
  Filled 2016-04-28: qty 10

## 2016-04-28 MED ORDER — HEPARIN SOD (PORK) LOCK FLUSH 100 UNIT/ML IV SOLN
500.0000 [IU] | Freq: Once | INTRAVENOUS | Status: AC
  Administered 2016-04-28: 500 [IU] via INTRAVENOUS
  Filled 2016-04-28: qty 5

## 2016-04-28 MED ORDER — SODIUM CHLORIDE 0.9 % IV SOLN
200.0000 mg/m2 | Freq: Once | INTRAVENOUS | Status: AC
  Administered 2016-04-28: 324 mg via INTRAVENOUS
  Filled 2016-04-28: qty 54

## 2016-04-28 MED ORDER — ONDANSETRON HCL 8 MG PO TABS: 8.0000 mg | ORAL_TABLET | Freq: Three times a day (TID) | ORAL | 1 refills | Status: DC | PRN

## 2016-04-28 MED ORDER — SODIUM CHLORIDE 0.9% FLUSH
10.0000 mL | Freq: Once | INTRAVENOUS | Status: AC
  Administered 2016-04-28: 10 mL via INTRAVENOUS
  Filled 2016-04-28: qty 10

## 2016-04-28 MED ORDER — DIPHENHYDRAMINE HCL 50 MG/ML IJ SOLN
50.0000 mg | Freq: Once | INTRAMUSCULAR | Status: AC
  Administered 2016-04-28: 50 mg via INTRAVENOUS
  Filled 2016-04-28: qty 1

## 2016-04-28 MED ORDER — PROCHLORPERAZINE MALEATE 10 MG PO TABS: 10.0000 mg | ORAL_TABLET | Freq: Four times a day (QID) | ORAL | 1 refills | Status: DC | PRN

## 2016-04-28 MED ORDER — SODIUM CHLORIDE 0.9 % IV SOLN
20.0000 mg | Freq: Once | INTRAVENOUS | Status: AC
  Administered 2016-04-28: 20 mg via INTRAVENOUS
  Filled 2016-04-28: qty 2

## 2016-04-28 NOTE — Progress Notes (Signed)
[place] OFFICE PROGRESS NOTE  Patient Care Team: Mar Daring, PA-C as PCP - General (Family Medicine) Allyne Gee, MD as Referring Physician (Internal Medicine)  Malignant neoplasm of trachea, bronchus, and lung [facility])   Staging form: Lung, AJCC 7th Edition     Clinical: Stage IIIB (T4, N2, M0) - Signed by Cammie Sickle, MD on 06/24/2015    Oncology History   # FEB 2017-[place] CA Right Lower Lung T4N2M0- STAGE IIIB [Bronch & Subcarinal LNBx]- March 8th START CARBO-ALIMTA x4 cycles- June 8th 2017 - IMPROVED FDG MULTI-FOCAL lesions/N2-LN activity. S/p [place]-- Alimta x6  # AUG 18th- Alimta- Avastin maintenance; SEP 13th DISCONTINUE AVASTIN [sec to cavitary lesion]; OCT 20th 2017- CT-STABLE Disease; NOV 8th CT/PET [facility] clinic]- "overall slight progression-? Lymphangiectatic spread"  # NOV 2nd 2017-MRI, [facility]- Brain mets [asymptomatic;Right frontal ~86m; ~376mlesions -appx 3-4 lesions; (right Frontal & Left parietal s/p GK x 2 lesions]   # [facility] [Dr.Leventakos; [phone]/ZYSA] MOLECULAR STUDIES:  KRAS MUTATED/Tissues not sufficient for OTHER the molecular marker; will need repeat Bx ; GUARDIANT TESTING [[facility]]- pending.      [place] cancer of right lower lobe of lung [place]    INTERVAL HISTORY:  Deanna Mckneely48.o.  female pleasant patient above history of Metastatic adenocarcinoma the lung- Most recently on Opdivo status post 6 cycles noted to have progression of the disease- CT scan of the chest.  Fortunately patient continues to be asymptomatic from her progressive disease. Denies any new shortness of breath or chest pain or cough. She also denies any hemoptysis.  No fatigue. No unusual shortness of breath or cough. No nausea no vomiting no diarrhea no skin rash. Denies any headaches or vision changes. She is very emotional.    REVIEW OF SYSTEMS:  A complete 10 point review of system is done which is negative  except mentioned above/history of present illness.   PAST MEDICAL HISTORY :  Past Medical History:  Diagnosis Date  . Anxiety   . Cancer of right lung ([place]  . Depression   . Depression with anxiety    Well controlled with Wellbutrin and Buspar  . Pneumonia     PAST SURGICAL HISTORY :   Past Surgical History:  Procedure Laterality Date  . ECTOPIC PREGNANCY SURGERY  1988  . ELECTROMAGNETIC NAVIGATION BROCHOSCOPY N/A 02/10/2015   Procedure: ELECTROMAGNETIC NAVIGATION BRONCHOSCOPY;  Surgeon: KuFlora LippsMD;  Location: [facility] ORS;  Service: Cardiopulmonary;  Laterality: N/A;  . ENDOBRONCHIAL ULTRASOUND N/A 02/10/2015   Procedure: ENDOBRONCHIAL ULTRASOUND;  Surgeon: KuFlora LippsMD;  Location: [facility] ORS;  Service: Cardiopulmonary;  Laterality: N/A;  . PERIPHERAL VASCULAR CATHETERIZATION N/A 03/05/2015   Procedure: PoGlori Luisath Insertion;  Surgeon: GrKatha CabalMD;  [facility];  Service: Cardiovascular;  Laterality: N/A;    FAMILY HISTORY :   Family History  Problem Relation Age of Onset  . Cancer Father     Prostate Cancer  . Hypertension Father   . Stroke Father   . Hypertension Brother     SOCIAL HISTORY:   Social History  Substance Use Topics  . Smoking status: Former Smoker    Packs/day: 0.25    Years: 30.00    Types: Cigarettes    Quit date: 02/06/2005  . Smokeless tobacco: Never Used  . Alcohol use 3.0 oz/week    5 Cans of beer per week     Comment: 5 beers weekly  ALLERGIES:  has No Known Allergies.  MEDICATIONS:  Current Outpatient Prescriptions  Medication Sig Dispense Refill  . busPIRone (BUSPAR) 5 MG tablet Take 1 tablet (5 mg total) by mouth 2 (two) times daily. 60 tablet 6  . lidocaine-prilocaine (EMLA) cream Apply 1 application topically as needed. 30 g 2  . SPIRIVA RESPIMAT 1.25 MCG/ACT AERS     . SYMBICORT 80-4.5 MCG/ACT inhaler     . zolpidem (AMBIEN) 5 MG tablet Take 1 tablet (5 mg total) by mouth at bedtime as needed for  sleep. 30 tablet 5  . ondansetron (ZOFRAN) 8 MG tablet Take 1 tablet (8 mg total) by mouth every 8 (eight) hours as needed for nausea or vomiting (start 3 days; after chemo). 40 tablet 1  . prochlorperazine (COMPAZINE) 10 MG tablet Take 1 tablet (10 mg total) by mouth every 6 (six) hours as needed for nausea or vomiting. 40 tablet 1   No current facility-administered medications for this visit.    Facility-Administered Medications Ordered in Other Visits  Medication Dose Route Frequency Provider Last Rate Last Dose  . CARBOplatin (PARAPLATIN) 570 mg in sodium chloride 0.9 % 250 mL chemo infusion  570 mg Intravenous Once Cammie Sickle, MD      . heparin lock flush 100 unit/mL  500 Units Intravenous Once Cammie Sickle, MD      . pegfilgrastim (NEULASTA ONPRO KIT) injection 6 mg  6 mg Subcutaneous Once Cammie Sickle, MD        PHYSICAL EXAMINATION: ECOG PERFORMANCE STATUS: 0 - Asymptomatic  BP (!) 149/84 (BP Location: Left Arm, Patient Position: Sitting)   Pulse 66   Temp 97.9 F (36.6 C) (Tympanic)   Resp 16   Wt 134 lb 8 oz (61 kg)   LMP 06/16/2000 (Approximate) Comment: 15 yrs ago  BMI 24.60 kg/m   Filed Weights   04/28/16 0851  Weight: 134 lb 8 oz (61 kg)    GENERAL: Well-nourished well-developed; Alert, no distress and comfortable.   She is alone. EYES: no pallor or icterus OROPHARYNX: no thrush or ulceration; good dentition  NECK: supple, no masses felt LYMPH:  no palpable lymphadenopathy in the cervical, axillary or inguinal regions LUNGS: clear to auscultation and  No wheeze or crackles HEART/CVS: regular rate & rhythm and no murmurs; No lower extremity edema ABDOMEN:abdomen soft, non-tender and normal bowel sounds Musculoskeletal:no cyanosis of digits and no clubbing  PSYCH: alert & oriented x 3 with fluent speech; NEURO: no focal motor/sensory deficits SKIN:  no rashes or significant lesions  LABORATORY DATA:  I have reviewed the data as  listed    Component Value Date/Time   NA 139 04/28/2016 0823   NA 139 01/03/2015 0910   K 3.9 04/28/2016 0823   CL 109 04/28/2016 0823   CO2 24 04/28/2016 0823   GLUCOSE 100 (H) 04/28/2016 0823   BUN 16 04/28/2016 0823   BUN 10 01/03/2015 0910   CREATININE 0.81 04/28/2016 0823   CALCIUM 9.3 04/28/2016 0823   PROT 7.0 04/28/2016 0823   PROT 6.5 01/03/2015 0910   ALBUMIN 4.0 04/28/2016 0823   ALBUMIN 3.8 01/03/2015 0910   AST 32 04/28/2016 0823   ALT 17 04/28/2016 0823   ALKPHOS 75 04/28/2016 0823   BILITOT 0.5 04/28/2016 0823   BILITOT <0.2 01/03/2015 0910   GFRNONAA >60 04/28/2016 0823   GFRAA >60 04/28/2016 0823    No results found for: SPEP, UPEP  Lab Results  Component Value Date   WBC  4.4 04/28/2016   NEUTROABS 3.0 04/28/2016   HGB 12.1 04/28/2016   HCT 34.0 (L) 04/28/2016   MCV 104.7 (H) 04/28/2016   PLT 229 04/28/2016      Chemistry      Component Value Date/Time   NA 139 04/28/2016 0823   NA 139 01/03/2015 0910   K 3.9 04/28/2016 0823   CL 109 04/28/2016 0823   CO2 24 04/28/2016 0823   BUN 16 04/28/2016 0823   BUN 10 01/03/2015 0910   CREATININE 0.81 04/28/2016 0823      Component Value Date/Time   CALCIUM 9.3 04/28/2016 0823   ALKPHOS 75 04/28/2016 0823   AST 32 04/28/2016 0823   ALT 17 04/28/2016 0823   BILITOT 0.5 04/28/2016 0823   BILITOT <0.2 01/03/2015 0910       RADIOGRAPHIC STUDIES: I have personally reviewed the radiological images as listed and agreed with the findings in the report. No results found.   ASSESSMENT & PLAN:  Primary cancer of right lower lobe of lung ([place]) T4 N2 M1-adenocarcinoma of the lung right side- status post 6 cycles of Opdivo- restaging CAT scan shows progression of the disease multifocal right-sided lung cancer with lymphangitic spread.Also Brain MRI- shows 2 enhancing lesions in brain [discussion below]   # proceed with carbo-taxol today; again discussed the potential side effects.    # Growth  factor-Neulasta/On pro would be given as prophylaxis for chemotherapy-induced neutropenia to prevent febrile neutropenias.  Discussed re: use of claritin.   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 Monitor for now.   # labs-10 days; follow up in 3 weeks/chemo.   # # Reviewed/counselled regarding the goals of care- being palliative/treatment are usually indefinite-until progression or side effects. Goal is to maintain quality of life as the disease is incurable.   # discussed at tumor conference- will plan lung Bx thru bronch- after few cycles of chemo. Again reviewed the imaging with pt in detail.   Orders Placed This Encounter  Procedures  . CBC with Differential    Standing Status:   Standing    Number of Occurrences:   20    Standing Expiration Date:   04/28/2017  . Comprehensive metabolic panel    Standing Status:   Standing    Number of Occurrences:   20    Standing Expiration Date:   04/28/2017  . CBC with Differential    Standing Status:   Future    Standing Expiration Date:   04/28/2017  . Basic metabolic panel    Standing Status:   Future    Standing Expiration Date:   04/28/2017     Cammie Sickle, MD 04/28/2016 2:05 PM

## 2016-04-28 NOTE — Assessment & Plan Note (Signed)
T4 N2 M1-adenocarcinoma of the lung right side- status post 6 cycles of Opdivo- restaging CAT scan shows progression of the disease multifocal right-sided lung cancer with lymphangitic spread.Also Brain MRI- shows 2 enhancing lesions in brain [discussion below]   # proceed with carbo-taxol today; again discussed the potential side effects.    # Growth factor-Neulasta/On pro would be given as prophylaxis for chemotherapy-induced neutropenia to prevent febrile neutropenias.  Discussed re: use of claritin.   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 Monitor for now.   # labs-10 days; follow up in 3 weeks/chemo.   # # Reviewed/counselled regarding the goals of care- being palliative/treatment are usually indefinite-until progression or side effects. Goal is to maintain quality of life as the disease is incurable.   # discussed at tumor conference- will plan lung Bx thru bronch- after few cycles of chemo. Again reviewed the imaging with pt in detail.

## 2016-04-28 NOTE — Progress Notes (Signed)
Patient here today for follow up.   

## 2016-05-03 ENCOUNTER — Telehealth: Payer: Self-pay | Admitting: *Deleted

## 2016-05-03 MED ORDER — IBUPROFEN 800 MG PO TABS: 800.0000 mg | ORAL_TABLET | Freq: Three times a day (TID) | ORAL | 0 refills | Status: DC

## 2016-05-03 NOTE — Telephone Encounter (Signed)
Reports that she is having pain after chemo, she had On Pro, she was taking Claritin and Tylenol, but it was not helping describing her pain from neck to calves and "very intense" she is still having pain this morning in her abdominal and calf areas, states it is in the bones. Please advise, She is asking to be seen tomorrow.

## 2016-05-03 NOTE — Telephone Encounter (Signed)
Advised her husband of order and e scribed Ibu to Quail. He repeated this back to me.

## 2016-05-03 NOTE — Telephone Encounter (Signed)
Per Dr B advise pt to take '800mg'$  advil one tablet Q8HR x3 days and increase liquids

## 2016-05-07 ENCOUNTER — Inpatient Hospital Stay: Payer: BLUE CROSS/BLUE SHIELD | Attending: Internal Medicine

## 2016-05-07 DIAGNOSIS — Z5111 Encounter for antineoplastic chemotherapy: Secondary | ICD-10-CM | POA: Diagnosis not present

## 2016-05-07 DIAGNOSIS — C3431 Malignant neoplasm of lower lobe, right bronchus or lung: Secondary | ICD-10-CM | POA: Diagnosis present

## 2016-05-07 DIAGNOSIS — F418 Other specified anxiety disorders: Secondary | ICD-10-CM | POA: Insufficient documentation

## 2016-05-07 DIAGNOSIS — C7931 Secondary malignant neoplasm of brain: Secondary | ICD-10-CM | POA: Diagnosis not present

## 2016-05-07 DIAGNOSIS — Z79899 Other long term (current) drug therapy: Secondary | ICD-10-CM | POA: Diagnosis not present

## 2016-05-07 DIAGNOSIS — Z87891 Personal history of nicotine dependence: Secondary | ICD-10-CM | POA: Insufficient documentation

## 2016-05-07 DIAGNOSIS — Z8701 Personal history of pneumonia (recurrent): Secondary | ICD-10-CM | POA: Insufficient documentation

## 2016-05-07 LAB — CBC WITH DIFFERENTIAL/PLATELET
Basophils Absolute: 0.1 10*3/uL (ref 0–0.1)
Basophils Relative: 1 %
Eosinophils Absolute: 0.1 10*3/uL (ref 0–0.7)
Eosinophils Relative: 1 %
HCT: 32.4 % — ABNORMAL LOW (ref 35.0–47.0)
HEMOGLOBIN: 11.5 g/dL — AB (ref 12.0–16.0)
LYMPHS ABS: 1.4 10*3/uL (ref 1.0–3.6)
LYMPHS PCT: 11 %
MCH: 36.8 pg — AB (ref 26.0–34.0)
MCHC: 35.6 g/dL (ref 32.0–36.0)
MCV: 103.4 fL — AB (ref 80.0–100.0)
Monocytes Absolute: 0.8 10*3/uL (ref 0.2–0.9)
Monocytes Relative: 6 %
NEUTROS ABS: 10.7 10*3/uL — AB (ref 1.4–6.5)
NEUTROS PCT: 81 %
Platelets: 171 10*3/uL (ref 150–440)
RBC: 3.13 MIL/uL — ABNORMAL LOW (ref 3.80–5.20)
RDW: 11.9 % (ref 11.5–14.5)
WBC: 13.1 10*3/uL — AB (ref 3.6–11.0)

## 2016-05-07 LAB — BASIC METABOLIC PANEL
Anion gap: 8 (ref 5–15)
BUN: 9 mg/dL (ref 6–20)
CHLORIDE: 107 mmol/L (ref 101–111)
CO2: 24 mmol/L (ref 22–32)
Calcium: 9.2 mg/dL (ref 8.9–10.3)
Creatinine, Ser: 0.61 mg/dL (ref 0.44–1.00)
GFR calc Af Amer: >60 mL/min (ref >60)
GFR calc non Af Amer: >60 mL/min (ref >60)
GLUCOSE: 126 mg/dL — AB (ref 65–99)
POTASSIUM: 3.2 mmol/L — AB (ref 3.5–5.1)
Sodium: 139 mmol/L (ref 135–145)

## 2016-05-13 ENCOUNTER — Ambulatory Visit: Payer: BLUE CROSS/BLUE SHIELD

## 2016-05-13 ENCOUNTER — Ambulatory Visit: Payer: BLUE CROSS/BLUE SHIELD | Admitting: Internal Medicine

## 2016-05-13 ENCOUNTER — Other Ambulatory Visit: Payer: BLUE CROSS/BLUE SHIELD

## 2016-05-18 ENCOUNTER — Other Ambulatory Visit: Payer: Self-pay | Admitting: Internal Medicine

## 2016-05-19 ENCOUNTER — Inpatient Hospital Stay: Payer: BLUE CROSS/BLUE SHIELD

## 2016-05-19 ENCOUNTER — Inpatient Hospital Stay (HOSPITAL_BASED_OUTPATIENT_CLINIC_OR_DEPARTMENT_OTHER): Payer: BLUE CROSS/BLUE SHIELD | Admitting: Internal Medicine

## 2016-05-19 VITALS — BP 116/79 | HR 76 | Temp 96.9°F | Resp 18 | Ht 62.0 in | Wt 132.5 lb

## 2016-05-19 DIAGNOSIS — Z8701 Personal history of pneumonia (recurrent): Secondary | ICD-10-CM | POA: Diagnosis not present

## 2016-05-19 DIAGNOSIS — Z79899 Other long term (current) drug therapy: Secondary | ICD-10-CM | POA: Diagnosis not present

## 2016-05-19 DIAGNOSIS — C7931 Secondary malignant neoplasm of brain: Secondary | ICD-10-CM | POA: Diagnosis not present

## 2016-05-19 DIAGNOSIS — C3431 Malignant neoplasm of lower lobe, right bronchus or lung: Secondary | ICD-10-CM

## 2016-05-19 DIAGNOSIS — F418 Other specified anxiety disorders: Secondary | ICD-10-CM | POA: Diagnosis not present

## 2016-05-19 DIAGNOSIS — Z87891 Personal history of nicotine dependence: Secondary | ICD-10-CM | POA: Diagnosis not present

## 2016-05-19 LAB — COMPREHENSIVE METABOLIC PANEL
ALK PHOS: 91 U/L (ref 38–126)
ALT: 13 U/L — ABNORMAL LOW (ref 14–54)
AST: 31 U/L (ref 15–41)
Albumin: 4 g/dL (ref 3.5–5.0)
Anion gap: 8 (ref 5–15)
BILIRUBIN TOTAL: 0.5 mg/dL (ref 0.3–1.2)
BUN: 9 mg/dL (ref 6–20)
CALCIUM: 9.4 mg/dL (ref 8.9–10.3)
CO2: 22 mmol/L (ref 22–32)
CREATININE: 0.64 mg/dL (ref 0.44–1.00)
Chloride: 105 mmol/L (ref 101–111)
GFR calc Af Amer: >60 mL/min (ref >60)
GFR calc non Af Amer: >60 mL/min (ref >60)
Glucose, Bld: 138 mg/dL — ABNORMAL HIGH (ref 65–99)
POTASSIUM: 3.6 mmol/L (ref 3.5–5.1)
Sodium: 135 mmol/L (ref 135–145)
TOTAL PROTEIN: 7.3 g/dL (ref 6.5–8.1)

## 2016-05-19 LAB — CBC WITH DIFFERENTIAL/PLATELET
BASOS ABS: 0 10*3/uL (ref 0–0.1)
Basophils Relative: 0 %
Eosinophils Absolute: 0 10*3/uL (ref 0–0.7)
Eosinophils Relative: 1 %
HCT: 31.1 % — ABNORMAL LOW (ref 35.0–47.0)
Hemoglobin: 11.2 g/dL — ABNORMAL LOW (ref 12.0–16.0)
LYMPHS PCT: 11 %
Lymphs Abs: 0.9 10*3/uL — ABNORMAL LOW (ref 1.0–3.6)
MCH: 37.2 pg — ABNORMAL HIGH (ref 26.0–34.0)
MCHC: 36.1 g/dL — ABNORMAL HIGH (ref 32.0–36.0)
MCV: 103 fL — AB (ref 80.0–100.0)
MONO ABS: 0.8 10*3/uL (ref 0.2–0.9)
MONOS PCT: 9 %
NEUTROS ABS: 6.4 10*3/uL (ref 1.4–6.5)
Neutrophils Relative %: 79 %
Platelets: 321 10*3/uL (ref 150–440)
RBC: 3.02 MIL/uL — ABNORMAL LOW (ref 3.80–5.20)
RDW: 12.3 % (ref 11.5–14.5)
WBC: 8.1 10*3/uL (ref 3.6–11.0)

## 2016-05-19 MED ORDER — DIPHENHYDRAMINE HCL 50 MG/ML IJ SOLN
50.0000 mg | Freq: Once | INTRAMUSCULAR | Status: AC
  Administered 2016-05-19: 50 mg via INTRAVENOUS
  Filled 2016-05-19: qty 1

## 2016-05-19 MED ORDER — SODIUM CHLORIDE 0.9 % IV SOLN
Freq: Once | INTRAVENOUS | Status: AC
  Administered 2016-05-19: 10:00:00 via INTRAVENOUS
  Filled 2016-05-19: qty 1000

## 2016-05-19 MED ORDER — SODIUM CHLORIDE 0.9 % IV SOLN
200.0000 mg/m2 | Freq: Once | INTRAVENOUS | Status: AC
  Administered 2016-05-19: 324 mg via INTRAVENOUS
  Filled 2016-05-19: qty 54

## 2016-05-19 MED ORDER — HEPARIN SOD (PORK) LOCK FLUSH 100 UNIT/ML IV SOLN
500.0000 [IU] | Freq: Once | INTRAVENOUS | Status: AC | PRN
  Administered 2016-05-19: 500 [IU]

## 2016-05-19 MED ORDER — FAMOTIDINE IN NACL 20-0.9 MG/50ML-% IV SOLN
20.0000 mg | Freq: Once | INTRAVENOUS | Status: AC
  Administered 2016-05-19: 20 mg via INTRAVENOUS
  Filled 2016-05-19: qty 50

## 2016-05-19 MED ORDER — SODIUM CHLORIDE 0.9 % IV SOLN
574.2000 mg | Freq: Once | INTRAVENOUS | Status: AC
  Administered 2016-05-19: 570 mg via INTRAVENOUS
  Filled 2016-05-19: qty 57

## 2016-05-19 MED ORDER — SODIUM CHLORIDE 0.9 % IV SOLN
20.0000 mg | Freq: Once | INTRAVENOUS | Status: AC
  Administered 2016-05-19: 20 mg via INTRAVENOUS
  Filled 2016-05-19: qty 2

## 2016-05-19 MED ORDER — SODIUM CHLORIDE 0.9% FLUSH
10.0000 mL | INTRAVENOUS | Status: DC | PRN
  Administered 2016-05-19: 10 mL
  Filled 2016-05-19: qty 10

## 2016-05-19 MED ORDER — PALONOSETRON HCL INJECTION 0.25 MG/5ML
0.2500 mg | Freq: Once | INTRAVENOUS | Status: AC
  Administered 2016-05-19: 0.25 mg via INTRAVENOUS
  Filled 2016-05-19: qty 5

## 2016-05-19 NOTE — Assessment & Plan Note (Addendum)
T4 N2 M1-adenocarcinoma of the lung right side-on cabo-Taxol s/p cycle #1. Tolerated chemo okay- but significant body aches [see discussion below]. No obvious clinical signs of progression.  # proceed with carbo-taxol #2 today; labs acceptable. However we'll discontinue onpro [C discussion below]   # Bodyaches- question related to onpro; even after use of Claritin prophylactically. Discontinue onpro.    # Nausea/vomitting- G-1-2;  will add Emend with the next cycle.  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 Monitor for now.   # follow up in 3 weeks/X- MD/lbs chemo; 10 days abs-; follow up in 6 weeks; chemo/labs. CT scan prior.

## 2016-05-19 NOTE — Progress Notes (Signed)
Deanna Blair  Patient Care Team: Deanna Daring, PA-C as PCP - General (Family Medicine) Deanna Gee, MD as Referring Physician (Internal Medicine)  Malignant neoplasm of trachea, bronchus, and lung [facility])   Staging form: Lung, AJCC 7th Edition     Clinical: Stage IIIB (T4, N2, M0) - Signed by Deanna Sickle, MD on 06/24/2015    Oncology History   # FEB 2017-[place] Right Lower Lung T4N2M0- STAGE IIIB [Bronch & Subcarinal LNBx]- March 8th START CARBO-ALIMTA x4 cycles- June 8th 2017 - IMPROVED FDG MULTI-FOCAL lesions/N2-LN activity. S/p [place]-- Alimta x6  # AUG 18th- [place]- Avastin maintenance; SEP 13th DISCONTINUE AVASTIN [sec to cavitary lesion]; OCT 20th 2017- CT-STABLE Disease; NOV 8th CT/PET [facility] clinic]- "overall slight progression-? Lymphangiectatic spread"  # NOV 2nd 2017-MRI, [facility]- Brain mets [asymptomatic;Right frontal ~27m; ~382mlesions -appx 3-4 lesions; (right Frontal & Left parietal s/p GK x 2 lesions]   # [facility] [Dr.Leventakos; [phone]/EUMP] MOLECULAR STUDIES:  KRAS MUTATED/Tissues not sufficient for OTHER the molecular marker; will need repeat Bx ; GUARDIANT TESTING [[facility]]- pending.      Primary cancer of right lower lobe of lung [place]    INTERVAL HISTORY:  [name]51.o.  female pleasant patient above history of Recurrent Metastatic adenocarcinoma the lung- currently on carbo-taxol cycle #1 appax 3 weeks ago.   Patient states she felt poorly after the chemotherapy. She noted to have significant body aches muscle aches joint pains 3-4 days post chemotherapy. She attributes this to Neulasta. She took Motrin to help the pain.   She also had episode of nausea vomiting for which she had to take antiemetics for 2-3 days post chemotherapy. She denies any significant tingling and numbness at this time. She is distressed from the hair loss.  Today she feels okay. However very  concerned about the side effects from the previous cycle #1. Denies any unusual shortness of breath or cough. No diarrhea no constipation. No hemoptysis.   REVIEW OF SYSTEMS:  A complete 10 point review of system is done which is negative except mentioned above/history of present illness.   PAST MEDICAL HISTORY :  Past Medical History:  Diagnosis Date  . Anxiety   . Cancer of right [place] ([place]  . Depression   . Depression with anxiety    Well controlled with Wellbutrin and Buspar  . Pneumonia     PAST SURGICAL HISTORY :   Past Surgical History:  Procedure Laterality Date  . ECTOPIC PREGNANCY SURGERY  1988  . ELECTROMAGNETIC NAVIGATION BROCHOSCOPY N/A 02/10/2015   Procedure: ELECTROMAGNETIC NAVIGATION BRONCHOSCOPY;  Surgeon: KuFlora LippsMD;  Location: [facility] ORS;  Service: Cardiopulmonary;  Laterality: N/A;  . ENDOBRONCHIAL ULTRASOUND N/A 02/10/2015   Procedure: ENDOBRONCHIAL ULTRASOUND;  Surgeon: KuFlora LippsMD;  Location: [facility] ORS;  Service: Cardiopulmonary;  Laterality: N/A;  . PERIPHERAL VASCULAR CATHETERIZATION N/A 03/05/2015   Procedure: PoGlori Luisath Insertion;  Surgeon: GrKatha CabalMD;  [facility];  Service: Cardiovascular;  Laterality: N/A;    FAMILY HISTORY :   Family History  Problem Relation Age of Onset  . Cancer Father        Prostate Cancer  . Hypertension Father   . Stroke Father   . Hypertension Brother     SOCIAL HISTORY:   Social History  Substance Use Topics  . Smoking status: Former Smoker    Packs/day: 0.25    Years:  30.00    Types: Cigarettes    Quit date: 02/06/2005  . Smokeless tobacco: Never Used  . Alcohol use 3.0 oz/week    5 Cans of beer per week     Comment: 5 beers weekly    ALLERGIES:  has No Known Allergies.  MEDICATIONS:  Current Outpatient Prescriptions  Medication Sig Dispense Refill  . acetaminophen (TYLENOL) 500 MG tablet Take 1,000 mg by mouth 2 (two) times daily.    . busPIRone (BUSPAR) 5 MG  tablet Take 1 tablet (5 mg total) by mouth 2 (two) times daily. 60 tablet 6  . docusate sodium (COLACE) 100 MG capsule Take 100 mg by mouth 2 (two) times daily.    [name] Kitchen lidocaine-prilocaine (EMLA) cream Apply 1 application topically as needed. 30 g 2  . loratadine (CLARITIN) 5 MG chewable tablet Chew 5 mg by mouth daily. After Onpro injection    . ondansetron (ZOFRAN) 8 MG tablet Take 1 tablet (8 mg total) by mouth every 8 (eight) hours as needed for nausea or vomiting (start 3 days; after chemo). 40 tablet 1  . SPIRIVA RESPIMAT 1.25 MCG/ACT AERS     . SYMBICORT 80-4.5 MCG/ACT inhaler     . zolpidem (AMBIEN) 5 MG tablet TAKE ONE TABLET BY MOUTH AT BEDTIME AS NEEDED FOR SLEEP 30 tablet 5  . prochlorperazine (COMPAZINE) 10 MG tablet Take 1 tablet (10 mg total) by mouth every 6 (six) hours as needed for nausea or vomiting. (Patient not taking: Reported on 05/19/2016) 40 tablet 1   No current facility-administered medications for this visit.     PHYSICAL EXAMINATION: ECOG PERFORMANCE STATUS: 0 - Asymptomatic  BP 116/79 (BP Location: Right Arm, Patient Position: Sitting)   Pulse 76   Temp (!) 96.9 F (36.1 C) (Tympanic)   Resp 18   Ht 5' 2"  (1.575 m)   Wt 132 lb 8 oz (60.1 kg)   LMP 06/16/2000 (Approximate) Comment: 15 yrs ago  BMI 24.23 kg/m   Filed Weights   05/19/16 0841  Weight: 132 lb 8 oz (60.1 kg)    GENERAL: Well-nourished well-developed; Alert, no distress and comfortable.   She is alone. EYES: no pallor or icterus OROPHARYNX: no thrush or ulceration; good dentition  NECK: supple, no masses felt LYMPH:  no palpable lymphadenopathy in the cervical, axillary or inguinal regions LUNGS: clear to auscultation and  No wheeze or crackles HEART/CVS: regular rate & rhythm and no murmurs; No lower extremity edema ABDOMEN:abdomen soft, non-tender and normal bowel sounds Musculoskeletal:no cyanosis of digits and no clubbing  PSYCH: alert & oriented x 3 with fluent speech; NEURO: no  focal motor/sensory deficits SKIN:  no rashes or significant lesions  LABORATORY DATA:  I have reviewed the data as listed    Component Value Date/Time   NA 135 05/19/2016 0807   NA 139 01/03/2015 0910   K 3.6 05/19/2016 0807   CL 105 05/19/2016 0807   CO2 22 05/19/2016 0807   GLUCOSE 138 (H) 05/19/2016 0807   BUN 9 05/19/2016 0807   BUN 10 01/03/2015 0910   CREATININE 0.64 05/19/2016 0807   CALCIUM 9.4 05/19/2016 0807   PROT 7.3 05/19/2016 0807   PROT 6.5 01/03/2015 0910   ALBUMIN 4.0 05/19/2016 0807   ALBUMIN 3.8 01/03/2015 0910   AST 31 05/19/2016 0807   ALT 13 (L) 05/19/2016 0807   ALKPHOS 91 05/19/2016 0807   BILITOT 0.5 05/19/2016 0807   BILITOT <0.2 01/03/2015 0910   GFRNONAA >60 05/19/2016 2376  GFRAA >60 05/19/2016 0807    No results found for: SPEP, UPEP  Lab Results  Component Value Date   WBC 8.1 05/19/2016   NEUTROABS 6.4 05/19/2016   HGB 11.2 (L) 05/19/2016   HCT 31.1 (L) 05/19/2016   MCV 103.0 (H) 05/19/2016   PLT 321 05/19/2016      Chemistry      Component Value Date/Time   NA 135 05/19/2016 0807   NA 139 01/03/2015 0910   K 3.6 05/19/2016 0807   CL 105 05/19/2016 0807   CO2 22 05/19/2016 0807   BUN 9 05/19/2016 0807   BUN 10 01/03/2015 0910   CREATININE 0.64 05/19/2016 0807      Component Value Date/Time   CALCIUM 9.4 05/19/2016 0807   ALKPHOS 91 05/19/2016 0807   AST 31 05/19/2016 0807   ALT 13 (L) 05/19/2016 0807   BILITOT 0.5 05/19/2016 0807   BILITOT <0.2 01/03/2015 0910       RADIOGRAPHIC STUDIES: I have personally reviewed the radiological images as listed and agreed with the findings in the report. No results found.   ASSESSMENT & PLAN:  Primary cancer of right lower lobe of lung ([place]) T4 N2 M1-adenocarcinoma of the lung right side-on cabo-Taxol s/p cycle #1. Tolerated chemo okay- but significant body aches [see discussion below]. No obvious clinical signs of progression.  # proceed with carbo-taxol #2 today; labs  acceptable. However we'll discontinue onpro [C discussion below]   # Bodyaches- question related to onpro; even after use of Claritin prophylactically. Discontinue onpro.    # Nausea/vomitting- G-1-2;  will add Emend with the next cycle.  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 Monitor for now.   # follow up in 3 weeks/X- MD/lbs chemo; 10 days abs-; follow up in 6 weeks; chemo/labs. CT scan prior.   Orders Placed This Encounter  Procedures  . CT CHEST Deanna CONTRAST    Standing Status:   Future    Standing Expiration Date:   07/19/2017    Order Specific Question:   Reason for Exam (SYMPTOM  OR DIAGNOSIS REQUIRED)    Answer:   lung cancer    Order Specific Question:   Is the patient pregnant?    Answer:   No    Order Specific Question:   Preferred imaging location?    Answer:   [facility]  . CBC with Differential/Platelet    Standing Status:   Future    Standing Expiration Date:   05/19/2017  . Comprehensive metabolic panel    Standing Status:   Future    Standing Expiration Date:   05/19/2017     Deanna Sickle, MD 05/20/2016 10:04 AM

## 2016-05-19 NOTE — Progress Notes (Signed)
Patient c/o "severe joint pain after onpro patch within hours of me removing my patch."  States that this is "still not resolved."   c/o constipation x 3 days. Only used Colace. Did not use any other interventions. Last BM today.  Reports 1 episode of Nausea on Saturday. She took 1 tablet of Zofran at that time and nausea was relieved.  She reports mouth sores that appeared on the back side of her lower lip. Used Baking soda rinse and this was resolved the sores on the lips.

## 2016-05-21 ENCOUNTER — Telehealth: Payer: Self-pay | Admitting: *Deleted

## 2016-05-21 NOTE — Telephone Encounter (Signed)
As per pt's medication list, she is not on a sodium supplement.    Patient is on **docusate sodium (COLACE) 100 MG capsule**.  Notified pt of this, patient states that she was confused and appreciated the clarification.

## 2016-05-21 NOTE — Telephone Encounter (Signed)
States seh was going over her papers when she got home and it says she is no Sodium tabs BID, states seh does not have any of those ans would like a call back pleasae

## 2016-05-28 ENCOUNTER — Inpatient Hospital Stay: Payer: BLUE CROSS/BLUE SHIELD

## 2016-05-28 DIAGNOSIS — C3431 Malignant neoplasm of lower lobe, right bronchus or lung: Secondary | ICD-10-CM | POA: Diagnosis not present

## 2016-05-28 LAB — CBC WITH DIFFERENTIAL/PLATELET
BASOS ABS: 0 10*3/uL (ref 0–0.1)
BASOS PCT: 1 %
EOS ABS: 0 10*3/uL (ref 0–0.7)
Eosinophils Relative: 1 %
HEMATOCRIT: 26.7 % — AB (ref 35.0–47.0)
HEMOGLOBIN: 9.8 g/dL — AB (ref 12.0–16.0)
Lymphocytes Relative: 20 %
Lymphs Abs: 0.8 10*3/uL — ABNORMAL LOW (ref 1.0–3.6)
MCH: 37.3 pg — ABNORMAL HIGH (ref 26.0–34.0)
MCHC: 36.7 g/dL — ABNORMAL HIGH (ref 32.0–36.0)
MCV: 101.8 fL — ABNORMAL HIGH (ref 80.0–100.0)
Monocytes Absolute: 0.2 10*3/uL (ref 0.2–0.9)
Monocytes Relative: 7 %
NEUTROS ABS: 2.8 10*3/uL (ref 1.4–6.5)
NEUTROS PCT: 71 %
Platelets: 243 10*3/uL (ref 150–440)
RBC: 2.62 MIL/uL — ABNORMAL LOW (ref 3.80–5.20)
RDW: 12.3 % (ref 11.5–14.5)
WBC: 3.8 10*3/uL (ref 3.6–11.0)

## 2016-05-28 LAB — COMPREHENSIVE METABOLIC PANEL
ALBUMIN: 4.4 g/dL (ref 3.5–5.0)
ALK PHOS: 65 U/L (ref 38–126)
ALT: 18 U/L (ref 14–54)
AST: 30 U/L (ref 15–41)
Anion gap: 7 (ref 5–15)
BILIRUBIN TOTAL: 0.9 mg/dL (ref 0.3–1.2)
BUN: 12 mg/dL (ref 6–20)
CO2: 23 mmol/L (ref 22–32)
Calcium: 9.5 mg/dL (ref 8.9–10.3)
Chloride: 102 mmol/L (ref 101–111)
Creatinine, Ser: 0.64 mg/dL (ref 0.44–1.00)
GFR calc Af Amer: >60 mL/min (ref >60)
GFR calc non Af Amer: >60 mL/min (ref >60)
GLUCOSE: 101 mg/dL — AB (ref 65–99)
Potassium: 4.2 mmol/L (ref 3.5–5.1)
SODIUM: 132 mmol/L — AB (ref 135–145)
TOTAL PROTEIN: 7.4 g/dL (ref 6.5–8.1)

## 2016-06-08 NOTE — Progress Notes (Signed)
[place]  [phone] [phone]  ID: Deanna Blair OB: Nov 15, 1955  MR#: [id number]  HYI#:[id number]  Patient Care Team: Rubye Beach as PCP - General (Family Medicine) Allyne Gee, MD as Referring Physician (Internal Medicine)  CHIEF COMPLAINT: Primary cancer of right lower lobe of lung [facility])  INTERVAL HISTORY: Patient returns to clinic today for further evaluation and consideration of cycle 2 of carboplatinum and Taxol. She felt poorly after her first infusion particularly with body and muscle aches 3-4 days post chemotherapy which is likely secondary to Neulasta. She also had nausea and vomiting but these have since resolved. She is tearful and depressed today secondary to overall pain. She has no neurologic complaints. She denies any recent fevers. She has a fair appetite, but denies weight loss. She has no chest pain, shortness breath, cough, or hemoptysis. She denies any diarrhea or constipation. She has no urinary complaints. Patient otherwise feels well and offers no further specific complaints.   REVIEW OF SYSTEMS:   Review of Systems  Constitutional: Positive for malaise/fatigue. Negative for fever and weight loss.  Respiratory: Negative.  Negative for cough and shortness of breath.   Cardiovascular: Negative.  Negative for chest pain and leg swelling.  Gastrointestinal: Positive for nausea. Negative for abdominal pain.  Musculoskeletal: Positive for back pain, joint pain and myalgias.  Skin: Negative.  Negative for rash.  Neurological: Positive for weakness.  Psychiatric/Behavioral: Positive for depression. The patient is nervous/anxious.     As per HPI. Otherwise, a complete review of systems is negative.  PAST MEDICAL HISTORY: Past Medical History:  Diagnosis Date  . Anxiety   . Cancer of right lung ([place]) 02/13/2015  . Depression   . Depression with anxiety    Well controlled with Wellbutrin and Buspar  .  Pneumonia     PAST SURGICAL HISTORY: Past Surgical History:  Procedure Laterality Date  . ECTOPIC PREGNANCY SURGERY  1988  . ELECTROMAGNETIC NAVIGATION BROCHOSCOPY N/A 02/10/2015   Procedure: ELECTROMAGNETIC NAVIGATION BRONCHOSCOPY;  Surgeon: Flora Lipps, MD;  Location: [facility] ORS;  Service: Cardiopulmonary;  Laterality: N/A;  . ENDOBRONCHIAL ULTRASOUND N/A 02/10/2015   Procedure: ENDOBRONCHIAL ULTRASOUND;  Surgeon: Flora Lipps, MD;  Location: [facility] ORS;  Service: Cardiopulmonary;  Laterality: N/A;  . PERIPHERAL VASCULAR CATHETERIZATION N/A 03/05/2015   Procedure: Glori Luis Cath Insertion;  Surgeon: Katha Cabal, MD;  Location: [place] CV LAB;  Service: Cardiovascular;  Laterality: N/A;    FAMILY HISTORY: Family History  Problem Relation Age of Onset  . Cancer Father        Prostate Cancer  . Hypertension Father   . Stroke Father   . Hypertension Brother     ADVANCED DIRECTIVES (Y/N):  N  HEALTH MAINTENANCE: Social History  Substance Use Topics  . Smoking status: Former Smoker    Packs/day: 0.25    Years: 30.00    Types: Cigarettes    Quit date: 02/06/2005  . Smokeless tobacco: Never Used  . Alcohol use 3.0 oz/week    5 Cans of beer per week     Comment: 5 beers weekly     Colonoscopy:  PAP:  Bone density:  Lipid panel:  No Known Allergies  Current Outpatient Prescriptions  Medication Sig Dispense Refill  . acetaminophen (TYLENOL) 500 MG tablet Take 1,000 mg by mouth every 4 (four) hours as needed.     . busPIRone (BUSPAR) 5 MG tablet Take 1 tablet (5 mg total) by mouth 2 (two) times daily. [place]  tablet 6  . docusate sodium (COLACE) 100 MG capsule Take 100 mg by mouth 2 (two) times daily.    [name] Kitchen lidocaine-prilocaine (EMLA) cream Apply 1 application topically as needed. 30 g 2  . ondansetron (ZOFRAN) 8 MG tablet Take 1 tablet (8 mg total) by mouth every 8 (eight) hours as needed for nausea or vomiting (start 3 days; after chemo). 40 tablet 1  . Oxycodone HCl 10 MG TABS  Take 1 tablet (10 mg total) by mouth every 6 (six) hours. 30 tablet 0  . prochlorperazine (COMPAZINE) 10 MG tablet Take 1 tablet (10 mg total) by mouth every 6 (six) hours as needed for nausea or vomiting. 40 tablet 1  . SPIRIVA RESPIMAT 1.25 MCG/ACT AERS     . SYMBICORT 80-4.5 MCG/ACT inhaler     . zolpidem (AMBIEN) 5 MG tablet Take 1 tablet (5 mg total) by mouth at bedtime as needed. for sleep 30 tablet 2   No current facility-administered medications for this visit.     OBJECTIVE: Vitals:   06/09/16 0929  BP: (!) 150/90  Pulse: 80  Temp: 97.6 F (36.4 C)     Body mass index is 24.03 kg/m.    ECOG FS:1 - Symptomatic but completely ambulatory  General: Well-developed, well-nourished, no acute distress. Eyes: Pink conjunctiva, anicteric sclera. Lungs: Clear to auscultation bilaterally. Heart: Regular rate and rhythm. No rubs, murmurs, or gallops. Abdomen: Soft, nontender, nondistended. No organomegaly noted, normoactive bowel sounds. Musculoskeletal: No edema, cyanosis, or clubbing. Neuro: Alert, answering all questions appropriately. Cranial nerves grossly intact. Skin: No rashes or petechiae noted. Psych: Normal affect.   LAB RESULTS:  Lab Results  Component Value Date   NA 137 06/09/2016   K 3.6 06/09/2016   CL 108 06/09/2016   CO2 20 (L) 06/09/2016   GLUCOSE 148 (H) 06/09/2016   BUN 13 06/09/2016   CREATININE 0.79 06/09/2016   CALCIUM 9.2 06/09/2016   PROT 6.9 06/09/2016   ALBUMIN 4.0 06/09/2016   AST 32 06/09/2016   ALT 12 (L) 06/09/2016   ALKPHOS 57 06/09/2016   BILITOT 0.6 06/09/2016   GFRNONAA >60 06/09/2016   GFRAA >60 06/09/2016    Lab Results  Component Value Date   WBC 5.4 06/09/2016   NEUTROABS 4.0 06/09/2016   HGB 9.2 (L) 06/09/2016   HCT 25.4 (L) 06/09/2016   MCV 105.2 (H) 06/09/2016   PLT 95 (L) 06/09/2016     STUDIES: No results found.  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 Tolerated chemo okay- but significant body aches [see discussion below]. No obvious clinical signs of progression.  # Delay cycle 3 today. Neulasta has been discontinued given significant myalgias after cycle 1.  # Bodyaches- question related to onpro; even after use of Claritin prophylactically. Discontinue onpro.    # Nausea/vomitting- G-1-2;  Emend headed for subsequent cycles.  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 Monitor for now.   # follow up in 1 week for reconsideration of cycle 3 and further evaluation.  # thrombocytopenia: Secondary to chemotherapy. Delay treatment as above. Monitor.  # Pain: Patient was given a prescription for oxycodone today.  Patient expressed understanding and was in agreement with this plan. She also understands that She can call clinic at any time with any questions,  concerns, or complaints.    Lloyd Huger, MD   06/15/2016 1:23 PM

## 2016-06-09 ENCOUNTER — Inpatient Hospital Stay (HOSPITAL_BASED_OUTPATIENT_CLINIC_OR_DEPARTMENT_OTHER): Payer: BLUE CROSS/BLUE SHIELD | Admitting: Oncology

## 2016-06-09 ENCOUNTER — Other Ambulatory Visit: Payer: Self-pay | Admitting: Oncology

## 2016-06-09 ENCOUNTER — Inpatient Hospital Stay: Payer: BLUE CROSS/BLUE SHIELD | Attending: Oncology

## 2016-06-09 ENCOUNTER — Inpatient Hospital Stay: Payer: BLUE CROSS/BLUE SHIELD

## 2016-06-09 VITALS — BP 150/90 | HR 80 | Temp 97.6°F | Wt 131.4 lb

## 2016-06-09 DIAGNOSIS — R29810 Facial weakness: Secondary | ICD-10-CM | POA: Insufficient documentation

## 2016-06-09 DIAGNOSIS — D61810 Antineoplastic chemotherapy induced pancytopenia: Secondary | ICD-10-CM | POA: Diagnosis not present

## 2016-06-09 DIAGNOSIS — R5383 Other fatigue: Secondary | ICD-10-CM

## 2016-06-09 DIAGNOSIS — F418 Other specified anxiety disorders: Secondary | ICD-10-CM | POA: Insufficient documentation

## 2016-06-09 DIAGNOSIS — R112 Nausea with vomiting, unspecified: Secondary | ICD-10-CM

## 2016-06-09 DIAGNOSIS — D696 Thrombocytopenia, unspecified: Secondary | ICD-10-CM | POA: Diagnosis not present

## 2016-06-09 DIAGNOSIS — G936 Cerebral edema: Secondary | ICD-10-CM | POA: Diagnosis not present

## 2016-06-09 DIAGNOSIS — C7931 Secondary malignant neoplasm of brain: Secondary | ICD-10-CM | POA: Diagnosis not present

## 2016-06-09 DIAGNOSIS — Z8701 Personal history of pneumonia (recurrent): Secondary | ICD-10-CM

## 2016-06-09 DIAGNOSIS — F329 Major depressive disorder, single episode, unspecified: Secondary | ICD-10-CM | POA: Insufficient documentation

## 2016-06-09 DIAGNOSIS — Z87891 Personal history of nicotine dependence: Secondary | ICD-10-CM | POA: Insufficient documentation

## 2016-06-09 DIAGNOSIS — Z79899 Other long term (current) drug therapy: Secondary | ICD-10-CM | POA: Insufficient documentation

## 2016-06-09 DIAGNOSIS — R531 Weakness: Secondary | ICD-10-CM

## 2016-06-09 DIAGNOSIS — C3431 Malignant neoplasm of lower lobe, right bronchus or lung: Secondary | ICD-10-CM

## 2016-06-09 DIAGNOSIS — M791 Myalgia: Secondary | ICD-10-CM | POA: Diagnosis not present

## 2016-06-09 DIAGNOSIS — Z5111 Encounter for antineoplastic chemotherapy: Secondary | ICD-10-CM | POA: Diagnosis not present

## 2016-06-09 LAB — COMPREHENSIVE METABOLIC PANEL
ALK PHOS: 57 U/L (ref 38–126)
ALT: 12 U/L — ABNORMAL LOW (ref 14–54)
ANION GAP: 9 (ref 5–15)
AST: 32 U/L (ref 15–41)
Albumin: 4 g/dL (ref 3.5–5.0)
BILIRUBIN TOTAL: 0.6 mg/dL (ref 0.3–1.2)
BUN: 13 mg/dL (ref 6–20)
CALCIUM: 9.2 mg/dL (ref 8.9–10.3)
CO2: 20 mmol/L — ABNORMAL LOW (ref 22–32)
Chloride: 108 mmol/L (ref 101–111)
Creatinine, Ser: 0.79 mg/dL (ref 0.44–1.00)
GFR calc non Af Amer: >60 mL/min (ref >60)
GLUCOSE: 148 mg/dL — AB (ref 65–99)
Potassium: 3.6 mmol/L (ref 3.5–5.1)
Sodium: 137 mmol/L (ref 135–145)
TOTAL PROTEIN: 6.9 g/dL (ref 6.5–8.1)

## 2016-06-09 LAB — CBC WITH DIFFERENTIAL/PLATELET
Basophils Absolute: 0 10*3/uL (ref 0–0.1)
Basophils Relative: 0 %
Eosinophils Absolute: 0.1 10*3/uL (ref 0–0.7)
Eosinophils Relative: 2 %
HEMATOCRIT: 25.4 % — AB (ref 35.0–47.0)
HEMOGLOBIN: 9.2 g/dL — AB (ref 12.0–16.0)
LYMPHS ABS: 0.9 10*3/uL — AB (ref 1.0–3.6)
Lymphocytes Relative: 16 %
MCH: 38 pg — AB (ref 26.0–34.0)
MCHC: 36.1 g/dL — AB (ref 32.0–36.0)
MCV: 105.2 fL — AB (ref 80.0–100.0)
Monocytes Absolute: 0.4 10*3/uL (ref 0.2–0.9)
Monocytes Relative: 7 %
NEUTROS ABS: 4 10*3/uL (ref 1.4–6.5)
NEUTROS PCT: 75 %
Platelets: 95 10*3/uL — ABNORMAL LOW (ref 150–440)
RBC: 2.42 MIL/uL — ABNORMAL LOW (ref 3.80–5.20)
RDW: 13.6 % (ref 11.5–14.5)
WBC: 5.4 10*3/uL (ref 3.6–11.0)

## 2016-06-09 MED ORDER — OXYCODONE HCL 10 MG PO TABS: 10.0000 mg | ORAL_TABLET | Freq: Four times a day (QID) | ORAL | 0 refills | Status: DC

## 2016-06-09 MED ORDER — ZOLPIDEM TARTRATE 5 MG PO TABS: 5.0000 mg | ORAL_TABLET | Freq: Every evening | ORAL | 2 refills | Status: DC | PRN

## 2016-06-09 MED ORDER — SODIUM CHLORIDE 0.9% FLUSH
10.0000 mL | INTRAVENOUS | Status: DC | PRN
Start: 2016-06-09 — End: 2016-06-09
  Administered 2016-06-09: 10 mL via INTRAVENOUS
  Filled 2016-06-09: qty 10

## 2016-06-09 MED ORDER — HEPARIN SOD (PORK) LOCK FLUSH 100 UNIT/ML IV SOLN
500.0000 [IU] | Freq: Once | INTRAVENOUS | Status: AC
  Administered 2016-06-09: 500 [IU] via INTRAVENOUS
  Filled 2016-06-09: qty 5

## 2016-06-09 NOTE — Progress Notes (Signed)
Patient here today for follow up.  Patient has been in a lot of pain over the past several weeks, c/o leg pain that wakes her from sleep, describes pain in arms and legs as "peircing", patient is very upset today, began crying during our discussion .

## 2016-06-15 NOTE — Progress Notes (Signed)
[place]  Telephone:(336) [phone] [phone]) [phone]  ID: Deanna Blair OB: 1955-11-10  MR#: [id number]  HYQ#:[id number]  Patient Care Team: Rubye Beach as PCP - General (Family Medicine) Allyne Gee, MD as Referring Physician (Internal Medicine)  CHIEF COMPLAINT: Primary cancer of right lower lobe of lung [facility])  INTERVAL HISTORY: Patient returns to clinic today for further evaluation and reconsideration of cycle 3 of carboplatinum and Taxol. She felt poorly after her first infusion particularly with body and muscle aches 3-4 days post chemotherapy which is likely secondary to Neulasta. She also had nausea and vomiting but these have since resolved. Her pain is significantly better controlled since initiating oxycodone last week. She has no neurologic complaints. She denies any recent fevers. She has a fair appetite, but denies weight loss. She has no chest pain, shortness breath, cough, or hemoptysis. She denies any diarrhea or constipation. She has no urinary complaints. Patient otherwise feels well and offers no further specific complaints.   REVIEW OF SYSTEMS:   Review of Systems  Constitutional: Positive for malaise/fatigue. Negative for fever and weight loss.  Respiratory: Negative.  Negative for cough and shortness of breath.   Cardiovascular: Negative.  Negative for chest pain and leg swelling.  Gastrointestinal: Negative for abdominal pain and nausea.  Musculoskeletal: Positive for back pain and joint pain. Negative for myalgias.  Skin: Negative.  Negative for rash.  Neurological: Positive for weakness.  Psychiatric/Behavioral: Positive for depression. The patient is nervous/anxious.     As per HPI. Otherwise, a complete review of systems is negative.  PAST MEDICAL HISTORY: Past Medical History:  Diagnosis Date  . Anxiety   . Cancer of right lung ([place]) 02/13/2015  . Depression   . Depression with anxiety    Well controlled with  Wellbutrin and Buspar  . Pneumonia     PAST SURGICAL HISTORY: Past Surgical History:  Procedure Laterality Date  . ECTOPIC PREGNANCY SURGERY  1988  . ELECTROMAGNETIC NAVIGATION BROCHOSCOPY N/A 02/10/2015   Procedure: ELECTROMAGNETIC NAVIGATION BRONCHOSCOPY;  Surgeon: Flora Lipps, MD;  Location: [facility] ORS;  Service: Cardiopulmonary;  Laterality: N/A;  . ENDOBRONCHIAL ULTRASOUND N/A 02/10/2015   Procedure: ENDOBRONCHIAL ULTRASOUND;  Surgeon: Flora Lipps, MD;  Location: [facility] ORS;  Service: Cardiopulmonary;  Laterality: N/A;  . PERIPHERAL VASCULAR CATHETERIZATION N/A 03/05/2015   Procedure: Glori Luis Cath Insertion;  Surgeon: Katha Cabal, MD;  Location: [place] CV LAB;  Service: Cardiovascular;  Laterality: N/A;    FAMILY HISTORY: Family History  Problem Relation Age of Onset  . Cancer Father        Prostate Cancer  . Hypertension Father   . Stroke Father   . Hypertension Brother     ADVANCED DIRECTIVES (Y/N):  N  HEALTH MAINTENANCE: Social History  Substance Use Topics  . Smoking status: Former Smoker    Packs/day: 0.25    Years: 30.00    Types: Cigarettes    Quit date: 02/06/2005  . Smokeless tobacco: Never Used  . Alcohol use 3.0 oz/week    5 Cans of beer per week     Comment: 5 beers weekly     Colonoscopy:  PAP:  Bone density:  Lipid panel:  No Known Allergies  Current Outpatient Prescriptions  Medication Sig Dispense Refill  . acetaminophen (TYLENOL) 500 MG tablet Take 1,000 mg by mouth every 4 (four) hours as needed.     . busPIRone (BUSPAR) 5 MG tablet Take 1 tablet (5 mg total) by mouth 2 (two) times  daily. 60 tablet 6  . lidocaine-prilocaine (EMLA) cream Apply 1 application topically as needed. 30 g 2  . ondansetron (ZOFRAN) 8 MG tablet Take 1 tablet (8 mg total) by mouth every 8 (eight) hours as needed for nausea or vomiting (start 3 days; after chemo). 40 tablet 1  . Oxycodone HCl 10 MG TABS Take 1 tablet (10 mg total) by mouth every 6 (six) hours. 30  tablet 0  . prochlorperazine (COMPAZINE) 10 MG tablet Take 1 tablet (10 mg total) by mouth every 6 (six) hours as needed for nausea or vomiting. 40 tablet 1  . SPIRIVA RESPIMAT 1.25 MCG/ACT AERS     . SYMBICORT 80-4.5 MCG/ACT inhaler     . zolpidem (AMBIEN) 5 MG tablet Take 1 tablet (5 mg total) by mouth at bedtime as needed. for sleep 30 tablet 2  . docusate sodium (COLACE) 100 MG capsule Take 100 mg by mouth 2 (two) times daily.     No current facility-administered medications for this visit.     OBJECTIVE: Vitals:   06/16/16 0909  BP: 140/84  Pulse: 77  Resp: 18  Temp: (!) 96.9 F (36.1 C)     Body mass index is 22.79 kg/m.    ECOG FS:1 - Symptomatic but completely ambulatory  General: Well-developed, well-nourished, no acute distress. Eyes: Pink conjunctiva, anicteric sclera. Lungs: Clear to auscultation bilaterally. Heart: Regular rate and rhythm. No rubs, murmurs, or gallops. Abdomen: Soft, nontender, nondistended. No organomegaly noted, normoactive bowel sounds. Musculoskeletal: No edema, cyanosis, or clubbing. Neuro: Alert, answering all questions appropriately. Cranial nerves grossly intact. Skin: No rashes or petechiae noted. Psych: Normal affect.   LAB RESULTS:  Lab Results  Component Value Date   NA 137 06/16/2016   K 3.8 06/16/2016   CL 106 06/16/2016   CO2 24 06/16/2016   GLUCOSE 123 (H) 06/16/2016   BUN 15 06/16/2016   CREATININE 0.81 06/16/2016   CALCIUM 9.6 06/16/2016   PROT 7.5 06/16/2016   ALBUMIN 4.5 06/16/2016   AST 31 06/16/2016   ALT 17 06/16/2016   ALKPHOS 52 06/16/2016   BILITOT 0.6 06/16/2016   GFRNONAA >60 06/16/2016   GFRAA >60 06/16/2016    Lab Results  Component Value Date   WBC 3.3 (L) 06/16/2016   NEUTROABS 2.0 06/16/2016   HGB 9.4 (L) 06/16/2016   HCT 25.7 (L) 06/16/2016   MCV 106.8 (H) 06/16/2016   PLT 193 06/16/2016     STUDIES: Mr Jeri Cos ZO Contrast  Result Date: 06/17/2016 CLINICAL DATA:  Initial evaluation for  left-sided facial droop. EXAM: MRI HEAD WITHOUT AND WITH CONTRAST TECHNIQUE: Multiplanar, multiecho pulse sequences of the brain and surrounding structures were obtained without and with intravenous contrast. CONTRAST:  32mL MULTIHANCE GADOBENATE DIMEGLUMINE 529 MG/ML IV SOLN COMPARISON:  Prior MRI from 04/16/2016. FINDINGS: Brain: Dominant posterior right frontal lobe metastatic lesion with central necrosis again seen. This lesion is increased in size on today's exam now measuring 1.8 x 1.8 x 2.1 cm (series 12, image 11). Associated vasogenic edema has markedly worsened throughout the right cerebral hemisphere, extending into the right basal ganglia. Mass effect on the adjacent right lateral ventricle which is partially effaced. Associated right-to-left shift of up to 4 mm. No hydrocephalus or ventricular trapping. Basilar cisterns remain patent. Additional posterior left frontal lesion stable to slightly increased in size measuring 4-5 mm (series 12, image 128). Additional left parietal lobe metastatic lesion slightly increased in size measuring 6 mm (series 12, image 107). Faint punctate lesion  within the deep white matter of the left parietal region grossly stable (series 12, image 102). There is a new/more prominent lesion within the right cerebellar hemisphere measuring 5 mm (series 12, image 40), likely present on previous exam, although not delineated at that time. No evidence for acute infarct. Gray-white matter differentiation otherwise maintained. No extra-axial fluid collection. No other new acute intracranial hemorrhage. Pituitary gland within normal limits. Vascular: Major intracranial vascular flow voids are maintained. Skull and upper cervical spine: Craniocervical junction normal. Visualized upper cervical spine unremarkable. Bone marrow signal intensity within normal limits. No scalp soft tissue abnormality. Sinuses/Orbits: Globes and orbital soft tissues within normal limits. Paranasal sinuses and  mastoids are clear. Inner ear structures normal. IMPRESSION: 1. Interval enlargement in dominant right posterior frontal metastatic lesion, with markedly worsened vasogenic edema and mass effect with new 4 mm right-to-left midline shift. No hydrocephalus or ventricular trapping at this time. 2. Otherwise stable to slightly increased size of additional left parietal metastases as above, with new/increased size of 5 mm right cerebellar metastatic lesion. Changes are compatible with worsened disease. Electronically Signed   By: Jeannine Boga M.D.   On: 06/17/2016 19:33    ASSESSMENT & PLAN:  Primary cancer of right lower lobe of lung ([place])  T4 N2 M1-adenocarcinoma of the lung right side-on cabo-Taxol s/p cycle #2. Tolerated chemo okay- but significant body aches [see discussion below]. No obvious clinical signs of progression.  # Proceed with cycle 3 today. Neulasta has been discontinued given significant myalgias after cycle 1.  # Bodyaches- question related to onpro; even after use of Claritin prophylactically. Discontinue onpro.    # Nausea/vomitting- G-1-2;  Emend added for subsequent cycles.  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 Monitor for now.   # follow up in 1 week for laboratory work only and then in 3 weeks for consideration of cycle 4.   # thrombocytopenia: Secondary to chemotherapy. Resolved. Monitor.  # Pain: Significantly improved. Continue oxycodone as needed.   Patient expressed understanding and was in agreement with this plan. She also understands that She can call clinic at any time with any questions, concerns, or complaints.    Lloyd Huger, MD   06/18/2016 1:51 PM

## 2016-06-16 ENCOUNTER — Inpatient Hospital Stay (HOSPITAL_BASED_OUTPATIENT_CLINIC_OR_DEPARTMENT_OTHER): Payer: BLUE CROSS/BLUE SHIELD | Admitting: Oncology

## 2016-06-16 ENCOUNTER — Inpatient Hospital Stay: Payer: BLUE CROSS/BLUE SHIELD

## 2016-06-16 VITALS — BP 140/84 | HR 77 | Temp 96.9°F | Resp 18 | Ht 62.0 in | Wt 124.6 lb

## 2016-06-16 DIAGNOSIS — Z87891 Personal history of nicotine dependence: Secondary | ICD-10-CM

## 2016-06-16 DIAGNOSIS — C7931 Secondary malignant neoplasm of brain: Secondary | ICD-10-CM | POA: Diagnosis not present

## 2016-06-16 DIAGNOSIS — F418 Other specified anxiety disorders: Secondary | ICD-10-CM | POA: Diagnosis not present

## 2016-06-16 DIAGNOSIS — C3431 Malignant neoplasm of lower lobe, right bronchus or lung: Secondary | ICD-10-CM

## 2016-06-16 DIAGNOSIS — D696 Thrombocytopenia, unspecified: Secondary | ICD-10-CM

## 2016-06-16 DIAGNOSIS — Z8701 Personal history of pneumonia (recurrent): Secondary | ICD-10-CM | POA: Diagnosis not present

## 2016-06-16 DIAGNOSIS — Z79899 Other long term (current) drug therapy: Secondary | ICD-10-CM | POA: Diagnosis not present

## 2016-06-16 DIAGNOSIS — R5383 Other fatigue: Secondary | ICD-10-CM

## 2016-06-16 DIAGNOSIS — R112 Nausea with vomiting, unspecified: Secondary | ICD-10-CM

## 2016-06-16 DIAGNOSIS — R531 Weakness: Secondary | ICD-10-CM | POA: Diagnosis not present

## 2016-06-16 DIAGNOSIS — F329 Major depressive disorder, single episode, unspecified: Secondary | ICD-10-CM

## 2016-06-16 DIAGNOSIS — M791 Myalgia: Secondary | ICD-10-CM

## 2016-06-16 LAB — CBC WITH DIFFERENTIAL/PLATELET
Basophils Absolute: 0 10*3/uL (ref 0–0.1)
Basophils Relative: 1 %
EOS ABS: 0.1 10*3/uL (ref 0–0.7)
Eosinophils Relative: 2 %
HCT: 25.7 % — ABNORMAL LOW (ref 35.0–47.0)
HEMOGLOBIN: 9.4 g/dL — AB (ref 12.0–16.0)
LYMPHS ABS: 0.8 10*3/uL — AB (ref 1.0–3.6)
Lymphocytes Relative: 25 %
MCH: 38.9 pg — AB (ref 26.0–34.0)
MCHC: 36.4 g/dL — AB (ref 32.0–36.0)
MCV: 106.8 fL — ABNORMAL HIGH (ref 80.0–100.0)
MONOS PCT: 12 %
Monocytes Absolute: 0.4 10*3/uL (ref 0.2–0.9)
NEUTROS PCT: 60 %
Neutro Abs: 2 10*3/uL (ref 1.4–6.5)
Platelets: 193 10*3/uL (ref 150–440)
RBC: 2.41 MIL/uL — ABNORMAL LOW (ref 3.80–5.20)
RDW: 17.5 % — ABNORMAL HIGH (ref 11.5–14.5)
WBC: 3.3 10*3/uL — ABNORMAL LOW (ref 3.6–11.0)

## 2016-06-16 LAB — COMPREHENSIVE METABOLIC PANEL
ALK PHOS: 52 U/L (ref 38–126)
ALT: 17 U/L (ref 14–54)
ANION GAP: 7 (ref 5–15)
AST: 31 U/L (ref 15–41)
Albumin: 4.5 g/dL (ref 3.5–5.0)
BILIRUBIN TOTAL: 0.6 mg/dL (ref 0.3–1.2)
BUN: 15 mg/dL (ref 6–20)
CALCIUM: 9.6 mg/dL (ref 8.9–10.3)
CO2: 24 mmol/L (ref 22–32)
Chloride: 106 mmol/L (ref 101–111)
Creatinine, Ser: 0.81 mg/dL (ref 0.44–1.00)
Glucose, Bld: 123 mg/dL — ABNORMAL HIGH (ref 65–99)
Potassium: 3.8 mmol/L (ref 3.5–5.1)
SODIUM: 137 mmol/L (ref 135–145)
TOTAL PROTEIN: 7.5 g/dL (ref 6.5–8.1)

## 2016-06-16 MED ORDER — HEPARIN SOD (PORK) LOCK FLUSH 100 UNIT/ML IV SOLN
500.0000 [IU] | Freq: Once | INTRAVENOUS | Status: AC
  Administered 2016-06-16: 500 [IU] via INTRAVENOUS

## 2016-06-16 MED ORDER — DIPHENHYDRAMINE HCL 50 MG/ML IJ SOLN
50.0000 mg | Freq: Once | INTRAMUSCULAR | Status: AC
  Administered 2016-06-16: 50 mg via INTRAVENOUS
  Filled 2016-06-16: qty 1

## 2016-06-16 MED ORDER — SODIUM CHLORIDE 0.9 % IV SOLN
200.0000 mg/m2 | Freq: Once | INTRAVENOUS | Status: AC
  Administered 2016-06-16: 324 mg via INTRAVENOUS
  Filled 2016-06-16: qty 54

## 2016-06-16 MED ORDER — SODIUM CHLORIDE 0.9% FLUSH
10.0000 mL | INTRAVENOUS | Status: DC | PRN
  Administered 2016-06-16: 10 mL via INTRAVENOUS
  Filled 2016-06-16: qty 10

## 2016-06-16 MED ORDER — SODIUM CHLORIDE 0.9 % IV SOLN
570.0000 mg | Freq: Once | INTRAVENOUS | Status: AC
  Administered 2016-06-16: 570 mg via INTRAVENOUS
  Filled 2016-06-16: qty 57

## 2016-06-16 MED ORDER — FAMOTIDINE IN NACL 20-0.9 MG/50ML-% IV SOLN
20.0000 mg | Freq: Once | INTRAVENOUS | Status: AC
  Administered 2016-06-16: 20 mg via INTRAVENOUS
  Filled 2016-06-16: qty 50

## 2016-06-16 MED ORDER — HEPARIN SOD (PORK) LOCK FLUSH 100 UNIT/ML IV SOLN
500.0000 [IU] | Freq: Once | INTRAVENOUS | Status: DC | PRN
  Filled 2016-06-16: qty 5

## 2016-06-16 MED ORDER — SODIUM CHLORIDE 0.9 % IV SOLN
Freq: Once | INTRAVENOUS | Status: AC
  Administered 2016-06-16: 10:00:00 via INTRAVENOUS
  Filled 2016-06-16: qty 1000

## 2016-06-16 MED ORDER — PALONOSETRON HCL INJECTION 0.25 MG/5ML
0.2500 mg | Freq: Once | INTRAVENOUS | Status: AC
  Administered 2016-06-16: 0.25 mg via INTRAVENOUS
  Filled 2016-06-16: qty 5

## 2016-06-16 MED ORDER — FOSAPREPITANT DIMEGLUMINE INJECTION 150 MG
Freq: Once | INTRAVENOUS | Status: AC
  Administered 2016-06-16: 10:00:00 via INTRAVENOUS
  Filled 2016-06-16: qty 5

## 2016-06-16 NOTE — Progress Notes (Signed)
Patient here for follow-up for lung ca and carbo/taxol. Pt expressed emotional distress r/t "fear of the unknown." stated that she "broke down in the store this week when she was picking out a card for my daughter's birthday. I realized that this may be the last card that I will ever give my daughter. I was crying so hard that I had to leave the store. This has been my week-one event like this after another. My emotions are just everywhere." Emotional support and active listening provided to the patient. Pt declined speaking to chaplin regarding her 'feelings.'  Pt reports improvement in over all pain level.

## 2016-06-17 ENCOUNTER — Inpatient Hospital Stay (HOSPITAL_BASED_OUTPATIENT_CLINIC_OR_DEPARTMENT_OTHER): Payer: BLUE CROSS/BLUE SHIELD | Admitting: Oncology

## 2016-06-17 ENCOUNTER — Telehealth: Payer: Self-pay | Admitting: *Deleted

## 2016-06-17 ENCOUNTER — Ambulatory Visit
Admission: RE | Admit: 2016-06-17 | Discharge: 2016-06-17 | Disposition: A | Discharge status: Home / Self Care | Payer: BLUE CROSS/BLUE SHIELD | Source: Ambulatory Visit | Attending: Oncology | Admitting: Oncology

## 2016-06-17 VITALS — BP 129/77 | HR 69 | Temp 97.3°F

## 2016-06-17 DIAGNOSIS — G510 Bell's palsy: Secondary | ICD-10-CM

## 2016-06-17 DIAGNOSIS — R531 Weakness: Secondary | ICD-10-CM | POA: Diagnosis not present

## 2016-06-17 DIAGNOSIS — G936 Cerebral edema: Secondary | ICD-10-CM

## 2016-06-17 DIAGNOSIS — F418 Other specified anxiety disorders: Secondary | ICD-10-CM | POA: Diagnosis not present

## 2016-06-17 DIAGNOSIS — D696 Thrombocytopenia, unspecified: Secondary | ICD-10-CM

## 2016-06-17 DIAGNOSIS — R5383 Other fatigue: Secondary | ICD-10-CM | POA: Diagnosis not present

## 2016-06-17 DIAGNOSIS — Z8701 Personal history of pneumonia (recurrent): Secondary | ICD-10-CM | POA: Diagnosis not present

## 2016-06-17 DIAGNOSIS — Z79899 Other long term (current) drug therapy: Secondary | ICD-10-CM

## 2016-06-17 DIAGNOSIS — M791 Myalgia: Secondary | ICD-10-CM | POA: Diagnosis not present

## 2016-06-17 DIAGNOSIS — C3431 Malignant neoplasm of lower lobe, right bronchus or lung: Secondary | ICD-10-CM | POA: Diagnosis not present

## 2016-06-17 DIAGNOSIS — R112 Nausea with vomiting, unspecified: Secondary | ICD-10-CM

## 2016-06-17 DIAGNOSIS — G9389 Other specified disorders of brain: Secondary | ICD-10-CM | POA: Insufficient documentation

## 2016-06-17 DIAGNOSIS — Z87891 Personal history of nicotine dependence: Secondary | ICD-10-CM

## 2016-06-17 DIAGNOSIS — C7931 Secondary malignant neoplasm of brain: Secondary | ICD-10-CM | POA: Insufficient documentation

## 2016-06-17 DIAGNOSIS — R29810 Facial weakness: Secondary | ICD-10-CM | POA: Diagnosis not present

## 2016-06-17 DIAGNOSIS — F329 Major depressive disorder, single episode, unspecified: Secondary | ICD-10-CM

## 2016-06-17 MED ORDER — GADOBENATE DIMEGLUMINE 529 MG/ML IV SOLN
10.0000 mL | Freq: Once | INTRAVENOUS | Status: AC | PRN
  Administered 2016-06-17: 10 mL via INTRAVENOUS

## 2016-06-17 NOTE — Telephone Encounter (Signed)
Dr. Grayland Ormond spoke with patient last night, patient needs to be scheduled for add on MD visit today.

## 2016-06-17 NOTE — Telephone Encounter (Signed)
Patient called to report that she had right facial drooping since last night and was told we would see her this morning, awaiting a call for appt time. Please advise

## 2016-06-21 ENCOUNTER — Encounter: Payer: Self-pay | Admitting: Emergency Medicine

## 2016-06-21 ENCOUNTER — Emergency Department: Payer: BLUE CROSS/BLUE SHIELD

## 2016-06-21 ENCOUNTER — Inpatient Hospital Stay
Admission: EM | Admit: 2016-06-21 | Discharge: 2016-06-23 | DRG: 054 | Disposition: A | Discharge status: Home / Self Care | Payer: BLUE CROSS/BLUE SHIELD | Attending: Internal Medicine | Admitting: Internal Medicine

## 2016-06-21 ENCOUNTER — Other Ambulatory Visit: Payer: Self-pay

## 2016-06-21 DIAGNOSIS — R29810 Facial weakness: Secondary | ICD-10-CM | POA: Diagnosis present

## 2016-06-21 DIAGNOSIS — R531 Weakness: Secondary | ICD-10-CM | POA: Diagnosis not present

## 2016-06-21 DIAGNOSIS — T451X5A Adverse effect of antineoplastic and immunosuppressive drugs, initial encounter: Secondary | ICD-10-CM | POA: Diagnosis present

## 2016-06-21 DIAGNOSIS — F419 Anxiety disorder, unspecified: Secondary | ICD-10-CM | POA: Diagnosis not present

## 2016-06-21 DIAGNOSIS — D6959 Other secondary thrombocytopenia: Secondary | ICD-10-CM | POA: Diagnosis present

## 2016-06-21 DIAGNOSIS — R000 Tachycardia, unspecified: Secondary | ICD-10-CM | POA: Diagnosis present

## 2016-06-21 DIAGNOSIS — E876 Hypokalemia: Secondary | ICD-10-CM | POA: Diagnosis present

## 2016-06-21 DIAGNOSIS — C3431 Malignant neoplasm of lower lobe, right bronchus or lung: Secondary | ICD-10-CM | POA: Diagnosis present

## 2016-06-21 DIAGNOSIS — Z8701 Personal history of pneumonia (recurrent): Secondary | ICD-10-CM | POA: Diagnosis not present

## 2016-06-21 DIAGNOSIS — Z87891 Personal history of nicotine dependence: Secondary | ICD-10-CM | POA: Diagnosis not present

## 2016-06-21 DIAGNOSIS — C7931 Secondary malignant neoplasm of brain: Secondary | ICD-10-CM | POA: Diagnosis not present

## 2016-06-21 DIAGNOSIS — T458X5A Adverse effect of other primarily systemic and hematological agents, initial encounter: Secondary | ICD-10-CM | POA: Diagnosis present

## 2016-06-21 DIAGNOSIS — F329 Major depressive disorder, single episode, unspecified: Secondary | ICD-10-CM | POA: Diagnosis not present

## 2016-06-21 DIAGNOSIS — Z79899 Other long term (current) drug therapy: Secondary | ICD-10-CM

## 2016-06-21 DIAGNOSIS — M791 Myalgia: Secondary | ICD-10-CM | POA: Diagnosis present

## 2016-06-21 DIAGNOSIS — F418 Other specified anxiety disorders: Secondary | ICD-10-CM | POA: Diagnosis present

## 2016-06-21 DIAGNOSIS — G936 Cerebral edema: Secondary | ICD-10-CM | POA: Diagnosis not present

## 2016-06-21 DIAGNOSIS — R630 Anorexia: Secondary | ICD-10-CM | POA: Diagnosis not present

## 2016-06-21 DIAGNOSIS — Z79891 Long term (current) use of opiate analgesic: Secondary | ICD-10-CM

## 2016-06-21 DIAGNOSIS — G893 Neoplasm related pain (acute) (chronic): Secondary | ICD-10-CM | POA: Diagnosis not present

## 2016-06-21 DIAGNOSIS — Z7951 Long term (current) use of inhaled steroids: Secondary | ICD-10-CM

## 2016-06-21 DIAGNOSIS — D72819 Decreased white blood cell count, unspecified: Secondary | ICD-10-CM | POA: Diagnosis not present

## 2016-06-21 DIAGNOSIS — R5383 Other fatigue: Secondary | ICD-10-CM | POA: Diagnosis not present

## 2016-06-21 LAB — PROTIME-INR
INR: 0.93
PROTHROMBIN TIME: 12.5 s (ref 11.4–15.2)

## 2016-06-21 LAB — DIFFERENTIAL
Basophils Absolute: 0 10*3/uL (ref 0–0.1)
Basophils Relative: 1 %
Eosinophils Absolute: 0.1 10*3/uL (ref 0–0.7)
Eosinophils Relative: 3 %
LYMPHS ABS: 0.7 10*3/uL — AB (ref 1.0–3.6)
LYMPHS PCT: 26 %
Monocytes Absolute: 0.1 10*3/uL — ABNORMAL LOW (ref 0.2–0.9)
Monocytes Relative: 3 %
NEUTROS PCT: 67 %
Neutro Abs: 1.7 10*3/uL (ref 1.4–6.5)

## 2016-06-21 LAB — COMPREHENSIVE METABOLIC PANEL
ALBUMIN: 4.7 g/dL (ref 3.5–5.0)
ALK PHOS: 49 U/L (ref 38–126)
ALT: 33 U/L (ref 14–54)
AST: 48 U/L — AB (ref 15–41)
Anion gap: 11 (ref 5–15)
BILIRUBIN TOTAL: 1.4 mg/dL — AB (ref 0.3–1.2)
BUN: 23 mg/dL — AB (ref 6–20)
CALCIUM: 10.1 mg/dL (ref 8.9–10.3)
CO2: 25 mmol/L (ref 22–32)
CREATININE: 0.83 mg/dL (ref 0.44–1.00)
Chloride: 97 mmol/L — ABNORMAL LOW (ref 101–111)
GFR calc Af Amer: >60 mL/min (ref >60)
GFR calc non Af Amer: >60 mL/min (ref >60)
GLUCOSE: 128 mg/dL — AB (ref 65–99)
Potassium: 3.3 mmol/L — ABNORMAL LOW (ref 3.5–5.1)
SODIUM: 133 mmol/L — AB (ref 135–145)
TOTAL PROTEIN: 7.9 g/dL (ref 6.5–8.1)

## 2016-06-21 LAB — TROPONIN I
Troponin I: <0.03 ng/mL (ref <0.03)
Troponin I: <0.03 ng/mL (ref <0.03)
Troponin I: <0.03 ng/mL (ref <0.03)

## 2016-06-21 LAB — APTT: aPTT: 29 seconds (ref 24–36)

## 2016-06-21 LAB — CBC
HCT: 28.3 % — ABNORMAL LOW (ref 35.0–47.0)
HEMOGLOBIN: 10.2 g/dL — AB (ref 12.0–16.0)
MCH: 38.6 pg — AB (ref 26.0–34.0)
MCHC: 36.1 g/dL — ABNORMAL HIGH (ref 32.0–36.0)
MCV: 106.8 fL — ABNORMAL HIGH (ref 80.0–100.0)
PLATELETS: 225 10*3/uL (ref 150–440)
RBC: 2.65 MIL/uL — AB (ref 3.80–5.20)
RDW: 17.5 % — ABNORMAL HIGH (ref 11.5–14.5)
WBC: 2.5 10*3/uL — AB (ref 3.6–11.0)

## 2016-06-21 MED ORDER — ENOXAPARIN SODIUM 40 MG/0.4ML ~~LOC~~ SOLN
40.0000 mg | SUBCUTANEOUS | Status: DC
  Administered 2016-06-21 – 2016-06-22 (×2): 40 mg via SUBCUTANEOUS
  Filled 2016-06-21 (×2): qty 0.4

## 2016-06-21 MED ORDER — ACETAMINOPHEN 325 MG PO TABS: 650.0000 mg | ORAL_TABLET | Freq: Four times a day (QID) | ORAL | Status: DC | PRN

## 2016-06-21 MED ORDER — DEXAMETHASONE SODIUM PHOSPHATE 10 MG/ML IJ SOLN
10.0000 mg | Freq: Four times a day (QID) | INTRAMUSCULAR | Status: DC
  Administered 2016-06-21 – 2016-06-23 (×7): 10 mg via INTRAVENOUS
  Filled 2016-06-21 (×12): qty 1

## 2016-06-21 MED ORDER — ONDANSETRON HCL 4 MG PO TABS: 4.0000 mg | ORAL_TABLET | Freq: Four times a day (QID) | ORAL | Status: DC | PRN

## 2016-06-21 MED ORDER — PANTOPRAZOLE SODIUM 40 MG IV SOLR
40.0000 mg | INTRAVENOUS | Status: DC
  Administered 2016-06-21 – 2016-06-22 (×2): 40 mg via INTRAVENOUS
  Filled 2016-06-21 (×2): qty 40

## 2016-06-21 MED ORDER — ONDANSETRON HCL 4 MG/2ML IJ SOLN
4.0000 mg | Freq: Four times a day (QID) | INTRAMUSCULAR | Status: DC | PRN
  Administered 2016-06-22: 4 mg via INTRAVENOUS
  Filled 2016-06-21: qty 2

## 2016-06-21 MED ORDER — SODIUM CHLORIDE 0.9 % IV BOLUS (SEPSIS)
500.0000 mL | Freq: Once | INTRAVENOUS | Status: AC
  Administered 2016-06-21: 500 mL via INTRAVENOUS

## 2016-06-21 MED ORDER — MOMETASONE FURO-FORMOTEROL FUM 100-5 MCG/ACT IN AERO
2.0000 | INHALATION_SPRAY | Freq: Two times a day (BID) | RESPIRATORY_TRACT | Status: DC
  Administered 2016-06-21 – 2016-06-23 (×4): 2 via RESPIRATORY_TRACT
  Filled 2016-06-21 (×2): qty 8.8

## 2016-06-21 MED ORDER — TIOTROPIUM BROMIDE MONOHYDRATE 18 MCG IN CAPS
1.0000 | ORAL_CAPSULE | Freq: Every day | RESPIRATORY_TRACT | Status: DC
  Administered 2016-06-21 – 2016-06-23 (×3): 18 ug via RESPIRATORY_TRACT
  Filled 2016-06-21: qty 5

## 2016-06-21 MED ORDER — OXYCODONE HCL 5 MG PO TABS
10.0000 mg | ORAL_TABLET | Freq: Four times a day (QID) | ORAL | Status: DC | PRN
  Administered 2016-06-21 – 2016-06-23 (×5): 10 mg via ORAL
  Filled 2016-06-21 (×5): qty 2

## 2016-06-21 MED ORDER — BUSPIRONE HCL 5 MG PO TABS
5.0000 mg | ORAL_TABLET | Freq: Two times a day (BID) | ORAL | Status: DC
  Administered 2016-06-21 – 2016-06-23 (×4): 5 mg via ORAL
  Filled 2016-06-21 (×5): qty 1

## 2016-06-21 MED ORDER — PROCHLORPERAZINE MALEATE 10 MG PO TABS
10.0000 mg | ORAL_TABLET | Freq: Four times a day (QID) | ORAL | Status: DC | PRN
  Administered 2016-06-22: 10 mg via ORAL
  Filled 2016-06-21 (×3): qty 1

## 2016-06-21 MED ORDER — ACETAMINOPHEN 650 MG RE SUPP
650.0000 mg | Freq: Four times a day (QID) | RECTAL | Status: DC | PRN
Start: 2016-06-21 — End: 2016-06-23

## 2016-06-21 MED ORDER — ONDANSETRON HCL 4 MG/2ML IJ SOLN
4.0000 mg | Freq: Once | INTRAMUSCULAR | Status: AC
  Administered 2016-06-21: 4 mg via INTRAVENOUS
  Filled 2016-06-21: qty 2

## 2016-06-21 MED ORDER — POTASSIUM CHLORIDE CRYS ER 20 MEQ PO TBCR
40.0000 meq | EXTENDED_RELEASE_TABLET | ORAL | Status: AC
  Administered 2016-06-21 (×2): 40 meq via ORAL
  Filled 2016-06-21 (×2): qty 2

## 2016-06-21 MED ORDER — DEXAMETHASONE SODIUM PHOSPHATE 10 MG/ML IJ SOLN
10.0000 mg | Freq: Once | INTRAMUSCULAR | Status: AC
  Administered 2016-06-21: 10 mg via INTRAVENOUS
  Filled 2016-06-21: qty 1

## 2016-06-21 NOTE — ED Triage Notes (Signed)
Pt and husband report left sided facial droop, left side weakness, generalized weakness and short term memory loss that began ten days ago. Pt also reports insomnia and husband reports her speech is not as clear. Pt last chemo treatment last week. Pt had MRI of brain last week but does not know results. Pt with obvious left side facial droop and weakness in triage.

## 2016-06-21 NOTE — ED Provider Notes (Addendum)
[facility] Emergency Department Provider Note  ____________________________________________   I have reviewed the triage vital signs and the nursing notes.   HISTORY  Chief Complaint Facial Droop and Weakness    HPI Deanna Blair is a 61 y.o. female who unfortunately has metastatic  lung cancer, with no metastases to the brain. Patient has had gamma knife on her brain at [facility]. Patient is getting chemotherapy with last chemotherapy last week for these issues. Patient has not had any fever. Patient states that starting last Saturday, 9 days ago, she began to have left-sided facial weakness and left upper and lower showed a weakness. This has been progressing since that time. She had an MRI done a partially 4 days ago which showed significant mesenteric edema around known metastasis. Patient states she has not yet heard back from her oncologist and she is here for further information about what else needs to happen. She does feel that the weakness is progressing. She denies any headache. She has had persistent vomiting since chemotherapy last week and she feels generally weak in particularly weak in the left upper extremity. She denies any abdominal pain. She was also has noted somewhat confused after her dialysis on Tuesday but that is since resolved mostly she states. This been no seizure activity.    Past Medical History:  Diagnosis Date  . Anxiety   . Cancer of right lung ([place]) 02/13/2015  . Depression   . Depression with anxiety    Well controlled with Wellbutrin and Buspar  . Pneumonia     Patient Active Problem List   Diagnosis Date Noted  . Counseling regarding goals of care 01/28/2016  . Brain metastasis ([place]) 01/07/2016  . Primary cancer of right lower lobe of lung ([place]) 10/08/2015  . Encounter for antineoplastic chemotherapy 06/24/2015  . Adenopathy   . 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 Cardiac enlargement  01/03/2015  . Congestive heart failure ([place]) 01/02/2015  . History of tobacco abuse 12/20/2014  . History of cervical cancer 12/11/2014  . Tobacco abuse counseling 12/26/2012    Past Surgical History:  Procedure Laterality Date  . ECTOPIC PREGNANCY SURGERY  1988  . ELECTROMAGNETIC NAVIGATION BROCHOSCOPY N/A 02/10/2015   Procedure: ELECTROMAGNETIC NAVIGATION BRONCHOSCOPY;  Surgeon: Flora Lipps, MD;  Location: [facility] ORS;  Service: Cardiopulmonary;  Laterality: N/A;  . ENDOBRONCHIAL ULTRASOUND N/A 02/10/2015   Procedure: ENDOBRONCHIAL ULTRASOUND;  Surgeon: Flora Lipps, MD;  Location: [facility] ORS;  Service: Cardiopulmonary;  Laterality: N/A;  . PERIPHERAL VASCULAR CATHETERIZATION N/A 03/05/2015   Procedure: Glori Luis Cath Insertion;  Surgeon: Katha Cabal, MD;  Location: [place] [facility];  Service: Cardiovascular;  Laterality: N/A;    Prior to Admission medications   Medication Sig Start Date End Date Taking? Authorizing Provider  acetaminophen (TYLENOL) 500 MG tablet Take 1,000 mg by mouth every 4 (four) hours as needed.     [provider]  busPIRone (BUSPAR) 5 MG tablet Take 1 tablet (5 mg total) by mouth 2 (two) times daily. 02/26/16   Mar Daring, PA-C  docusate sodium (COLACE) 100 MG capsule Take 100 mg by mouth 2 (two) times daily.    [provider]  lidocaine-prilocaine (EMLA) cream Apply 1 application topically as needed. 03/07/15   Forest Gleason, MD  ondansetron (ZOFRAN) 8 MG tablet Take 1 tablet (8 mg total) by mouth every 8 (eight) hours as needed for nausea or vomiting (start 3 days; after chemo). 04/28/16   Cammie Sickle,  MD  Oxycodone HCl 10 MG TABS Take 1 tablet (10 mg total) by mouth every 6 (six) hours. 06/09/16   Lloyd Huger, MD  prochlorperazine (COMPAZINE) 10 MG tablet Take 1 tablet (10 mg total) by mouth every 6 (six) hours as needed for nausea or vomiting. 04/28/16   Cammie Sickle, MD  SPIRIVA RESPIMAT 1.25 MCG/ACT AERS  02/14/15    [provider]  SYMBICORT 80-4.5 MCG/ACT inhaler  02/13/15   [provider]  zolpidem (AMBIEN) 5 MG tablet Take 1 tablet (5 mg total) by mouth at bedtime as needed. for sleep 06/09/16   Lloyd Huger, MD    Allergies Patient has no known allergies.  Family History  Problem Relation Age of Onset  . Cancer Father        Prostate Cancer  . Hypertension Father   . Stroke Father   . Hypertension Brother     Social History Social History  Substance Use Topics  . Smoking status: Former Smoker    Packs/day: 0.25    Years: 30.00    Types: Cigarettes    Quit date: 02/06/2005  . Smokeless tobacco: Never Used  . Alcohol use 3.0 oz/week    5 Cans of beer per week     Comment: 5 beers weekly    Review of Systems Constitutional: No fever/chills Eyes: No visual changes. ENT: No sore throat. No stiff neck no neck pain Cardiovascular: Denies chest pain. Respiratory: Denies shortness of breath. Gastrointestinal:   Positive vomiting.  No diarrhea.  No constipation. Genitourinary: Negative for dysuria. Musculoskeletal: Negative lower extremity swelling Skin: Negative for rash. Neurological: Negative for severe headaches, however positive focal weakness or numbness.   ____________________________________________   PHYSICAL EXAM:  VITAL SIGNS: ED Triage Vitals  Enc Vitals Group     BP 06/21/16 1247 111/75     Pulse Rate 06/21/16 1247 (!) 111     Resp 06/21/16 1247 18     Temp 06/21/16 1247 97.8 F (36.6 C)     Temp Source 06/21/16 1247 Oral     SpO2 06/21/16 1247 97 %     Weight 06/21/16 1248 124 lb (56.2 kg)     Height 06/21/16 1248 5\' 2"  (1.575 m)     Head Circumference --      Peak Flow --      Pain Score 06/21/16 1247 3     Pain Loc --      Pain Edu? --      Excl. in [place]? --     Constitutional: Alert and oriented. No acute distress Eyes: Conjunctivae are normal Head: Atraumatic HEENT: No congestion/rhinnorhea. Mucous membranes are moist.   Oropharynx non-erythematous Neck:   Nontender with no meningismus, no masses, no stridor Cardiovascular: Normal rate, regular rhythm. Grossly normal heart sounds.  Good peripheral circulation. Respiratory: Normal respiratory effort.  No retractions. Lungs CTAB. Abdominal: Soft and nontender. No distention. No guarding no rebound Back:  There is no focal tenderness or step off.  there is no midline tenderness there are no lesions noted. there is no CVA tenderness Musculoskeletal: No lower extremity tenderness, no upper extremity tenderness. No joint effusions, no DVT signs strong distal pulses no edema Neurologic:  Normal speech and language. Mild left facial droop noted, 4-5 strength left upper and lower extremity, cranial nerves otherwise intact, EOMI, pupils reactive to light and equal, no obvious cerebellar signs noted Skin:  Skin is warm, dry and intact. No rash noted. Psychiatric: Mood and affect are  normal. Speech and behavior are normal.  ____________________________________________   LABS (all labs ordered are listed, but only abnormal results are displayed)  Labs Reviewed  CBC - Abnormal; Notable for the following:       Result Value   WBC 2.5 (*)    RBC 2.65 (*)    Hemoglobin 10.2 (*)    HCT 28.3 (*)    MCV 106.8 (*)    MCH 38.6 (*)    MCHC 36.1 (*)    RDW 17.5 (*)    All other components within normal limits  DIFFERENTIAL - Abnormal; Notable for the following:    Lymphs Abs 0.7 (*)    Monocytes Absolute 0.1 (*)    All other components within normal limits  COMPREHENSIVE METABOLIC PANEL - Abnormal; Notable for the following:    Sodium 133 (*)    Potassium 3.3 (*)    Chloride 97 (*)    Glucose, Bld 128 (*)    BUN 23 (*)    AST 48 (*)    Total Bilirubin 1.4 (*)    All other components within normal limits  PROTIME-INR  APTT  TROPONIN I  CBG MONITORING, ED   ____________________________________________  EKG  I personally interpreted any EKGs ordered by me or  triage Sinus tach rate 104, ST did depression and inversion in V4 56 which appears to be new from last EKG a year and half ago. ____________________________________________  RADIOLOGY  I reviewed any imaging ordered by me or triage that were performed during my shift and, if possible, patient and/or family made aware of any abnormal findings. ____________________________________________   PROCEDURES  Procedure(s) performed: None  Procedures  Critical Care performed: None  ____________________________________________   INITIAL IMPRESSION / ASSESSMENT AND PLAN / ED COURSE  Pertinent labs & imaging results that were available during my care of the patient were reviewed by me and considered in my medical decision making (see chart for details).  Patient here with left-sided numbness and weakness for 10 days, most likely this is secondary to the MRI documented lesions with edema on her brain which was done as an outpatient. I did however repeat the CT scan to make sure there was no hemorrhagic conversion, which there is not. Patient also feels dehydrated. Also noted her EKG changes of uncertain significance. Patient has been vomiting but troponin is negative. Vital signs are reassuring and blood work is not unexpected given patient's history. We will continue to observe the patient closely here, I have paged on-call oncology for further help in managing this pathology. Most likely she will require Decadron IV fluids and admission.   ----------------------------------------- 2:23 PM on 06/21/2016 -----------------------------------------  Discussed with Dr. Grayland Ormond, he would like me to give her Decadron which I'm giving her, start IV hydration and have her admitted for rat on consultation and ongoing oncologic and IV steroid care. ____________________________________________   FINAL CLINICAL IMPRESSION(S) / ED DIAGNOSES  Final diagnoses:  None      This chart was dictated using  voice recognition software.  Despite best efforts to proofread,  errors can occur which can change meaning.      Schuyler Amor, MD 06/21/16 1410    Schuyler Amor, MD 06/21/16 [phone]

## 2016-06-21 NOTE — Progress Notes (Signed)
[place]  [phone] [phone]) [phone]  ID: Deanna Blair OB: September 21, 1955  MR#: [id number]  YOV#:[id number]  Patient Care Team: Rubye Beach as PCP - General (Family Medicine) Allyne Gee, MD as Referring Physician (Internal Medicine)  CHIEF COMPLAINT: Primary cancer of right lower lobe of lung [facility])  INTERVAL HISTORY: Patient returns to clinic today as an add-on after her and her husband noted a left facial droop. It appears to be subacute in nature having been present for the last 1-2 weeks. Patient did not mention a problem at her clinic visit yesterday. Her pain is significantly better controlled since initiating oxycodone last week. She has no other neurologic complaints. She denies any recent fevers. She has a fair appetite, but denies weight loss. She has no chest pain, shortness breath, cough, or hemoptysis. She denies any diarrhea or constipation. She has no urinary complaints. Patient otherwise feels well and offers no further specific complaints.   REVIEW OF SYSTEMS:   Review of Systems  Constitutional: Positive for malaise/fatigue. Negative for fever and weight loss.  Respiratory: Negative.  Negative for cough and shortness of breath.   Cardiovascular: Negative.  Negative for chest pain and leg swelling.  Gastrointestinal: Negative for abdominal pain and nausea.  Musculoskeletal: Positive for back pain and joint pain. Negative for myalgias.  Skin: Negative.  Negative for rash.  Neurological: Positive for focal weakness and weakness.  Psychiatric/Behavioral: Positive for depression. The patient is nervous/anxious.     As per HPI. Otherwise, a complete review of systems is negative.  PAST MEDICAL HISTORY: Past Medical History:  Diagnosis Date  . Anxiety   . Cancer of right lung ([place]) 02/13/2015  . Depression   . Depression with anxiety    Well controlled with Wellbutrin and Buspar  . Pneumonia     PAST SURGICAL  HISTORY: Past Surgical History:  Procedure Laterality Date  . ECTOPIC PREGNANCY SURGERY  1988  . ELECTROMAGNETIC NAVIGATION BROCHOSCOPY N/A 02/10/2015   Procedure: ELECTROMAGNETIC NAVIGATION BRONCHOSCOPY;  Surgeon: Flora Lipps, MD;  Location: [facility] ORS;  Service: Cardiopulmonary;  Laterality: N/A;  . ENDOBRONCHIAL ULTRASOUND N/A 02/10/2015   Procedure: ENDOBRONCHIAL ULTRASOUND;  Surgeon: Flora Lipps, MD;  Location: [facility] ORS;  Service: Cardiopulmonary;  Laterality: N/A;  . PERIPHERAL VASCULAR CATHETERIZATION N/A 03/05/2015   Procedure: Glori Luis Cath Insertion;  Surgeon: Katha Cabal, MD;  Location: [place] CV LAB;  Service: Cardiovascular;  Laterality: N/A;    FAMILY HISTORY: Family History  Problem Relation Age of Onset  . Cancer Father        Prostate Cancer  . Hypertension Father   . Stroke Father   . Hypertension Brother     ADVANCED DIRECTIVES (Y/N):  N  HEALTH MAINTENANCE: Social History  Substance Use Topics  . Smoking status: Former Smoker    Packs/day: 0.25    Years: 30.00    Types: Cigarettes    Quit date: 02/06/2005  . Smokeless tobacco: Never Used  . Alcohol use 3.0 oz/week    5 Cans of beer per week     Comment: 5 beers weekly     Colonoscopy:  PAP:  Bone density:  Lipid panel:  No Known Allergies  No current facility-administered medications for this visit.    Current Outpatient Prescriptions  Medication Sig Dispense Refill  . acetaminophen (TYLENOL) 500 MG tablet Take 1,000 mg by mouth every 4 (four) hours as needed.     . busPIRone (BUSPAR) 5 MG tablet Take 1 tablet (5  mg total) by mouth 2 (two) times daily. 60 tablet 6  . docusate sodium (COLACE) 100 MG capsule Take 100 mg by mouth 2 (two) times daily.    [name] Kitchen ibuprofen (ADVIL,MOTRIN) 800 MG tablet Take 800 mg by mouth every 8 (eight) hours as needed for mild pain or moderate pain.    [name] Kitchen lidocaine-prilocaine (EMLA) cream Apply 1 application topically as needed. 30 g 2  . ondansetron (ZOFRAN) 8 MG  tablet Take 1 tablet (8 mg total) by mouth every 8 (eight) hours as needed for nausea or vomiting (start 3 days; after chemo). (Patient not taking: Reported on 06/21/2016) 40 tablet 1  . Oxycodone HCl 10 MG TABS Take 1 tablet (10 mg total) by mouth every 6 (six) hours. 30 tablet 0  . prochlorperazine (COMPAZINE) 10 MG tablet Take 1 tablet (10 mg total) by mouth every 6 (six) hours as needed for nausea or vomiting. 40 tablet 1  . SPIRIVA RESPIMAT 1.25 MCG/ACT AERS Inhale 1 puff into the lungs daily.     . SYMBICORT 80-4.5 MCG/ACT inhaler Inhale 1 puff into the lungs every 12 (twelve) hours.     [name] Kitchen zolpidem (AMBIEN) 5 MG tablet Take 1 tablet (5 mg total) by mouth at bedtime as needed. for sleep 30 tablet 2   Facility-Administered Medications Ordered in Other Visits  Medication Dose Route Frequency Provider Last Rate Last Dose  . dexamethasone (DECADRON) injection 10 mg  10 mg Intravenous Once Schuyler Amor, MD      . ondansetron [facility]) injection 4 mg  4 mg Intravenous Once Charlotte Crumb A, MD      . sodium chloride 0.9 % bolus 500 mL  500 mL Intravenous Once Schuyler Amor, MD        OBJECTIVE: Vitals:   06/17/16 1419  BP: 129/77  Pulse: 69  Temp: 97.3 F (36.3 C)     There is no height or weight on file to calculate BMI.    ECOG FS:1 - Symptomatic but completely ambulatory  General: Well-developed, well-nourished, no acute distress. Eyes: Pink conjunctiva, anicteric sclera. Lungs: Clear to auscultation bilaterally. Heart: Regular rate and rhythm. No rubs, murmurs, or gallops. Abdomen: Soft, nontender, nondistended. No organomegaly noted, normoactive bowel sounds. Musculoskeletal: No edema, cyanosis, or clubbing. Neuro: Alert, answering all questions appropriately. Left facial droop noted.  Skin: No rashes or petechiae noted. Psych: Normal affect.   LAB RESULTS:  Lab Results  Component Value Date   NA 133 (L) 06/21/2016   K 3.3 (L) 06/21/2016   CL 97 (L) 06/21/2016   CO2  25 06/21/2016   GLUCOSE 128 (H) 06/21/2016   BUN 23 (H) 06/21/2016   CREATININE 0.83 06/21/2016   CALCIUM 10.1 06/21/2016   PROT 7.9 06/21/2016   ALBUMIN 4.7 06/21/2016   AST 48 (H) 06/21/2016   ALT 33 06/21/2016   ALKPHOS 49 06/21/2016   BILITOT 1.4 (H) 06/21/2016   GFRNONAA >60 06/21/2016   GFRAA >60 06/21/2016    Lab Results  Component Value Date   WBC 2.5 (L) 06/21/2016   NEUTROABS 1.7 06/21/2016   HGB 10.2 (L) 06/21/2016   HCT 28.3 (L) 06/21/2016   MCV 106.8 (H) 06/21/2016   PLT 225 06/21/2016     STUDIES: Ct Head Wo Contrast  Result Date: 06/21/2016 CLINICAL DATA:  Left facial droop and numbness left arm. Metastatic lung cancer EXAM: CT HEAD WITHOUT CONTRAST TECHNIQUE: Contiguous axial images were obtained from the base of the skull through the vertex without intravenous contrast.  COMPARISON:  MRI 06/17/2016 FINDINGS: Brain: No metastatic disease to the brain. Right frontal mass lesion with surrounding vasogenic edema similar to the prior MRI. This is causing 5 mm midline shift to the left. No other areas of edema. Small enhancing lesions are present elsewhere in the brain compatible with metastatic disease as noted on MRI. Negative for hemorrhage. Negative for hydrocephalus. Vascular: Negative for hyperdense vessel Skull: Negative Sinuses/Orbits: Negative Other: None IMPRESSION: Known metastatic disease to the brain. Right frontal mass lesion with large amount of edema unchanged from the recent MRI. Negative for acute hemorrhage. Electronically Signed   By: Franchot Gallo M.D.   On: 06/21/2016 13:35   Mr Deanna Blair HK Contrast  Result Date: 06/17/2016 CLINICAL DATA:  Initial evaluation for left-sided facial droop. EXAM: MRI HEAD WITHOUT AND WITH CONTRAST TECHNIQUE: Multiplanar, multiecho pulse sequences of the brain and surrounding structures were obtained without and with intravenous contrast. CONTRAST:  32mL MULTIHANCE GADOBENATE DIMEGLUMINE 529 MG/ML IV SOLN COMPARISON:  Prior  MRI from 04/16/2016. FINDINGS: Brain: Dominant posterior right frontal lobe metastatic lesion with central necrosis again seen. This lesion is increased in size on today's exam now measuring 1.8 x 1.8 x 2.1 cm (series 12, image 11). Associated vasogenic edema has markedly worsened throughout the right cerebral hemisphere, extending into the right basal ganglia. Mass effect on the adjacent right lateral ventricle which is partially effaced. Associated right-to-left shift of up to 4 mm. No hydrocephalus or ventricular trapping. Basilar cisterns remain patent. Additional posterior left frontal lesion stable to slightly increased in size measuring 4-5 mm (series 12, image 128). Additional left parietal lobe metastatic lesion slightly increased in size measuring 6 mm (series 12, image 107). Faint punctate lesion within the deep white matter of the left parietal region grossly stable (series 12, image 102). There is a new/more prominent lesion within the right cerebellar hemisphere measuring 5 mm (series 12, image 40), likely present on previous exam, although not delineated at that time. No evidence for acute infarct. Gray-white matter differentiation otherwise maintained. No extra-axial fluid collection. No other new acute intracranial hemorrhage. Pituitary gland within normal limits. Vascular: Major intracranial vascular flow voids are maintained. Skull and upper cervical spine: Craniocervical junction normal. Visualized upper cervical spine unremarkable. Bone marrow signal intensity within normal limits. No scalp soft tissue abnormality. Sinuses/Orbits: Globes and orbital soft tissues within normal limits. Paranasal sinuses and mastoids are clear. Inner ear structures normal. IMPRESSION: 1. Interval enlargement in dominant right posterior frontal metastatic lesion, with markedly worsened vasogenic edema and mass effect with new 4 mm right-to-left midline shift. No hydrocephalus or ventricular trapping at this time. 2.  Otherwise stable to slightly increased size of additional left parietal metastases as above, with new/increased size of 5 mm right cerebellar metastatic lesion. Changes are compatible with worsened disease. Electronically Signed   By: Jeannine Boga M.D.   On: 06/17/2016 19:33    ASSESSMENT & PLAN:  Primary cancer of right lower lobe of lung ([place])  T4 N2 M1-adenocarcinoma of the lung right side-on cabo-Taxol s/p cycle #2. Tolerated chemo okay- but significant body aches [see discussion below]. No obvious clinical signs of progression.  # Received cycle 3 of carboplatinum and Taxol yesterday. Neulasta has been discontinued given significant myalgias after cycle 1.  # Bodyaches- question related to onpro; even after use of Claritin prophylactically. Discontinue onpro.    # Nausea/vomitting- G-1-2;  Emend added for subsequent cycles.  # Left facial droop: Stat MRI of the brain completed on June 18, 2016 revealed suspicion of progression of disease as well as vasogenic edema. Patient will require Decadron and a radiation oncology referral.   # follow up as previously scheduled for consideration of cycle 4.   # thrombocytopenia: Secondary to chemotherapy. Resolved. Monitor.  # Pain: Significantly improved. Continue oxycodone as needed.   Patient expressed understanding and was in agreement with this plan. She also understands that She can call clinic at any time with any questions, concerns, or complaints.    Lloyd Huger, MD   06/21/2016 2:51 PM

## 2016-06-21 NOTE — H&P (Signed)
[facility] [place] NAME: Deanna Blair    MR#:  [id number]  DATE OF BIRTH:  03/02/1955  DATE OF ADMISSION:  06/21/2016  PRIMARY CARE PHYSICIAN: Mar Daring, PA-C   REQUESTING/REFERRING PHYSICIAN: Schuyler Amor, MD  CHIEF COMPLAINT:  Facial droop and weakness with intractable nausea and vomiting  HISTORY OF PRESENT ILLNESS:  Deanna Blair  is a 61 y.o. female with a known history of Metastatic lung cancer with brain metastases is getting chemotherapy and last chemotherapy was last week with no issues. Presenting today with intractable nausea and vomiting facial droop and weakness. The patient had MRI done 4 days ago which has revealed significant edema at around known metastases. CT head has revealed Right frontal mass lesion with large amount of edema unchanged from the recent MRI. Negative for acute hemorrhage. Patient denies any blurry vision. Dr. Grayland Ormond has recommended IV Decadron  PAST MEDICAL HISTORY:   Past Medical History:  Diagnosis Date  . Anxiety   . Cancer of right lung ([place]) 02/13/2015  . Depression   . Depression with anxiety    Well controlled with Wellbutrin and Buspar  . Pneumonia     PAST SURGICAL HISTOIRY:   Past Surgical History:  Procedure Laterality Date  . ECTOPIC PREGNANCY SURGERY  1988  . ELECTROMAGNETIC NAVIGATION BROCHOSCOPY N/A 02/10/2015   Procedure: ELECTROMAGNETIC NAVIGATION BRONCHOSCOPY;  Surgeon: Flora Lipps, MD;  Location: [facility];  Service: Cardiopulmonary;  Laterality: N/A;  . ENDOBRONCHIAL ULTRASOUND N/A 02/10/2015   Procedure: ENDOBRONCHIAL ULTRASOUND;  Surgeon: Flora Lipps, MD;  Location: [facility];  Service: Cardiopulmonary;  Laterality: N/A;  . PERIPHERAL VASCULAR CATHETERIZATION N/A 03/05/2015   Procedure: Glori Luis Cath Insertion;  Surgeon: Katha Cabal, MD;  Location: [place] [facility];  Service: Cardiovascular;  Laterality: N/A;    SOCIAL HISTORY:   Social History   Substance Use Topics  . Smoking status: Former Smoker    Packs/day: 0.25    Years: 30.00    Types: Cigarettes    Quit date: 02/06/2005  . Smokeless tobacco: Never Used  . Alcohol use 3.0 oz/week    5 Cans of beer per week     Comment: 5 beers weekly    FAMILY HISTORY:   Family History  Problem Relation Age of Onset  . Cancer Father        Prostate Cancer  . Hypertension Father   . Stroke Father   . Hypertension Brother     DRUG ALLERGIES:  No Known Allergies  REVIEW OF SYSTEMS:  CONSTITUTIONAL: No fever, fatigue ,Reporting generalized weakness.  EYES: No blurred or double vision.  EARS, NOSE, AND THROAT: No tinnitus or ear pain.  RESPIRATORY: No cough, shortness of breath, wheezing or hemoptysis.  CARDIOVASCULAR: No chest pain, orthopnea, edema.  GASTROINTESTINAL: Reporting nausea, improved vomiting, denies diarrhea or abdominal pain.  GENITOURINARY: No dysuria, hematuria.  ENDOCRINE: No polyuria, nocturia,  HEMATOLOGY: No anemia, easy bruising or bleeding SKIN: No rash or lesion. MUSCULOSKELETAL: No joint pain or arthritis.   NEUROLOGIC: No tingling, numbness, weakness.  PSYCHIATRY: No anxiety or depression.   MEDICATIONS AT HOME:   Prior to Admission medications   Medication Sig Start Date End Date Taking? Authorizing Provider  acetaminophen (TYLENOL) 500 MG tablet Take 1,000 mg by mouth every 4 (four) hours as needed.    Yes [provider]  busPIRone (BUSPAR) 5 MG tablet Take 1 tablet (5 mg total) by mouth 2 (two) times daily. 02/26/16  Yes Mar Daring, PA-C  docusate sodium (COLACE) 100 MG capsule Take 100 mg by mouth 2 (two) times daily.   Yes [provider]  ibuprofen (ADVIL,MOTRIN) 800 MG tablet Take 800 mg by mouth every 8 (eight) hours as needed for mild pain or moderate pain.   Yes [provider]  lidocaine-prilocaine (EMLA) cream Apply 1 application topically as needed. 03/07/15  Yes Choksi, Delorise Shiner, MD  Oxycodone HCl 10  MG TABS Take 1 tablet (10 mg total) by mouth every 6 (six) hours. 06/09/16  Yes Lloyd Huger, MD  prochlorperazine (COMPAZINE) 10 MG tablet Take 1 tablet (10 mg total) by mouth every 6 (six) hours as needed for nausea or vomiting. 04/28/16  Yes Cammie Sickle, MD  SPIRIVA RESPIMAT 1.25 MCG/ACT AERS Inhale 1 puff into the lungs daily.  02/14/15  Yes [provider]  SYMBICORT 80-4.5 MCG/ACT inhaler Inhale 1 puff into the lungs every 12 (twelve) hours.  02/13/15  Yes [provider]  zolpidem (AMBIEN) 5 MG tablet Take 1 tablet (5 mg total) by mouth at bedtime as needed. for sleep 06/09/16  Yes Lloyd Huger, MD  ondansetron (ZOFRAN) 8 MG tablet Take 1 tablet (8 mg total) by mouth every 8 (eight) hours as needed for nausea or vomiting (start 3 days; after chemo). Patient not taking: Reported on 06/21/2016 04/28/16   Cammie Sickle, MD      VITAL SIGNS:  Blood pressure 106/67, pulse 80, temperature 97.8 F (36.6 C), temperature source Oral, resp. rate 18, height 5\' 2"  (1.575 m), weight 56.2 kg (124 lb), last menstrual period 06/16/2000, SpO2 94 %.  PHYSICAL EXAMINATION:  GENERAL:  61 y.o.-year-old patient lying in the bed with no acute distress.  EYES: Pupils equal, round, reactive to light and accommodation. No scleral icterus. Extraocular muscles intact.  HEENT: Head atraumatic, normocephalic. Oropharynx and nasopharynx clear.  NECK:  Supple, no jugular venous distention. No thyroid enlargement, no tenderness.  LUNGS: Normal breath sounds bilaterally, no wheezing, rales,rhonchi or crepitation. No use of accessory muscles of respiration.  CARDIOVASCULAR: S1, S2 normal. No murmurs, rubs, or gallops.  ABDOMEN: Soft, nontender, nondistended. Bowel sounds present. No organomegaly or mass.  EXTREMITIES: No pedal edema, cyanosis, or clubbing.  NEUROLOGIC: Cranial nerves II through XII are intact. Muscle strength 5/5 in all extremities. Sensation intact. Gait not  checked.  PSYCHIATRIC: The patient is alert and oriented x 3.  SKIN: No obvious rash, lesion, or ulcer.   LABORATORY PANEL:   CBC  Recent Labs Lab 06/21/16 1255  WBC 2.5*  HGB 10.2*  HCT 28.3*  PLT 225   ------------------------------------------------------------------------------------------------------------------  Chemistries   Recent Labs Lab 06/21/16 1255  NA 133*  K 3.3*  CL 97*  CO2 25  GLUCOSE 128*  BUN 23*  CREATININE 0.83  CALCIUM 10.1  AST 48*  ALT 33  ALKPHOS 49  BILITOT 1.4*   ------------------------------------------------------------------------------------------------------------------  Cardiac Enzymes  Recent Labs Lab 06/21/16 1255  TROPONINI <0.03   ------------------------------------------------------------------------------------------------------------------  RADIOLOGY:  Ct Head Wo Contrast  Result Date: 06/21/2016 CLINICAL DATA:  Left facial droop and numbness left arm. Metastatic lung cancer EXAM: CT HEAD WITHOUT CONTRAST TECHNIQUE: Contiguous axial images were obtained from the base of the skull through the vertex without intravenous contrast. COMPARISON:  MRI 06/17/2016 FINDINGS: Brain: No metastatic disease to the brain. Right frontal mass lesion with surrounding vasogenic edema similar to the prior MRI. This is causing 5 mm midline shift to the left. No other areas of  edema. Small enhancing lesions are present elsewhere in the brain compatible with metastatic disease as noted on MRI. Negative for hemorrhage. Negative for hydrocephalus. Vascular: Negative for hyperdense vessel Skull: Negative Sinuses/Orbits: Negative Other: None IMPRESSION: Known metastatic disease to the brain. Right frontal mass lesion with large amount of edema unchanged from the recent MRI. Negative for acute hemorrhage. Electronically Signed   By: Franchot Gallo M.D.   On: 06/21/2016 13:35    EKG:   Orders placed or performed during the hospital encounter of  06/21/16  . ED EKG  . ED EKG    IMPRESSION AND PLAN:   Deanna Blair  is a 61 y.o. female with a known history of Metastatic lung cancer with brain metastases is getting chemotherapy and last chemotherapy was last week with no issues. Presenting today with intractable nausea and vomiting facial droop and weakness. The patient had MRI done 4 days ago which has revealed significant edema at around known metastases. CT head has revealed Right frontal mass lesion with large amount of edema unchanged from the recent MRI. Negative for acute hemorrhage  #Metastatic lung cancer with brain metastases, facial droop, weakness and cerebral edema Admit to telemetry Decadron 10 g IV every 6 hours Consult placed to Dr. Grayland Ormond discussed with him, recommending radiation therapy, he will discuss with oncology and radiation oncology, Dr. Donella Stade is not available at this time  #Intractable nausea and vomiting from cerebral edema Antiemetics and gentle hydration with IV fluids Supportive treatment  #Hypokalemia replete and recheck  #Abnormal EKG Monitor on telemetry, troponin negative, cardiac consult placed Cycle cardiac biomarkers   GI prophylaxis Protonix    All the records are reviewed and case discussed with ED provider. Management plans discussed with the patient, family and they are in agreement.  CODE STATUS: Full code, husband is the healthcare power of attorney  TOTAL CRITICAL CARE TIME TAKING CARE OF THIS PATIENT: 45 minutes.   Note: This dictation was prepared with Dragon dictation along with smaller phrase technology. Any transcriptional errors that result from this process are unintentional.  Nicholes Mango M.D on 06/21/2016 at 3:46 PM  Between 7am to 6pm - Pager - [phone]  After 6pm go to [email or web address] - password [email or web address] [facility]  Office  [phone]  CC: Primary care physician; Mar Daring, PA-C

## 2016-06-22 ENCOUNTER — Ambulatory Visit: Payer: BLUE CROSS/BLUE SHIELD

## 2016-06-22 ENCOUNTER — Ambulatory Visit: Payer: BLUE CROSS/BLUE SHIELD | Attending: Radiation Oncology | Admitting: Radiation Oncology

## 2016-06-22 ENCOUNTER — Inpatient Hospital Stay
Admit: 2016-06-22 | Discharge: 2016-06-22 | Disposition: A | Discharge status: Home / Self Care | Payer: BLUE CROSS/BLUE SHIELD | Attending: Physician Assistant | Admitting: Physician Assistant

## 2016-06-22 DIAGNOSIS — F329 Major depressive disorder, single episode, unspecified: Secondary | ICD-10-CM

## 2016-06-22 DIAGNOSIS — R531 Weakness: Secondary | ICD-10-CM

## 2016-06-22 DIAGNOSIS — G936 Cerebral edema: Secondary | ICD-10-CM

## 2016-06-22 DIAGNOSIS — R29810 Facial weakness: Secondary | ICD-10-CM

## 2016-06-22 DIAGNOSIS — C3431 Malignant neoplasm of lower lobe, right bronchus or lung: Secondary | ICD-10-CM

## 2016-06-22 DIAGNOSIS — C7931 Secondary malignant neoplasm of brain: Principal | ICD-10-CM

## 2016-06-22 DIAGNOSIS — F419 Anxiety disorder, unspecified: Secondary | ICD-10-CM

## 2016-06-22 DIAGNOSIS — Z87891 Personal history of nicotine dependence: Secondary | ICD-10-CM

## 2016-06-22 DIAGNOSIS — Z8701 Personal history of pneumonia (recurrent): Secondary | ICD-10-CM

## 2016-06-22 DIAGNOSIS — D72819 Decreased white blood cell count, unspecified: Secondary | ICD-10-CM

## 2016-06-22 DIAGNOSIS — R5383 Other fatigue: Secondary | ICD-10-CM

## 2016-06-22 DIAGNOSIS — G893 Neoplasm related pain (acute) (chronic): Secondary | ICD-10-CM

## 2016-06-22 DIAGNOSIS — R630 Anorexia: Secondary | ICD-10-CM

## 2016-06-22 DIAGNOSIS — Z79899 Other long term (current) drug therapy: Secondary | ICD-10-CM

## 2016-06-22 LAB — CBC
HEMATOCRIT: 24.8 % — AB (ref 35.0–47.0)
HEMOGLOBIN: 9.1 g/dL — AB (ref 12.0–16.0)
MCH: 38.6 pg — AB (ref 26.0–34.0)
MCHC: 36.5 g/dL — AB (ref 32.0–36.0)
MCV: 105.7 fL — AB (ref 80.0–100.0)
Platelets: 193 10*3/uL (ref 150–440)
RBC: 2.35 MIL/uL — AB (ref 3.80–5.20)
RDW: 17.4 % — ABNORMAL HIGH (ref 11.5–14.5)
WBC: 1.2 10*3/uL — CL (ref 3.6–11.0)

## 2016-06-22 LAB — COMPREHENSIVE METABOLIC PANEL
ALK PHOS: 44 U/L (ref 38–126)
ALT: 26 U/L (ref 14–54)
ANION GAP: 12 (ref 5–15)
AST: 31 U/L (ref 15–41)
Albumin: 4.4 g/dL (ref 3.5–5.0)
BILIRUBIN TOTAL: 0.9 mg/dL (ref 0.3–1.2)
BUN: 25 mg/dL — ABNORMAL HIGH (ref 6–20)
CALCIUM: 9.8 mg/dL (ref 8.9–10.3)
CO2: 23 mmol/L (ref 22–32)
CREATININE: 0.86 mg/dL (ref 0.44–1.00)
Chloride: 98 mmol/L — ABNORMAL LOW (ref 101–111)
Glucose, Bld: 144 mg/dL — ABNORMAL HIGH (ref 65–99)
Potassium: 4.4 mmol/L (ref 3.5–5.1)
Sodium: 133 mmol/L — ABNORMAL LOW (ref 135–145)
TOTAL PROTEIN: 7.4 g/dL (ref 6.5–8.1)

## 2016-06-22 LAB — TROPONIN I: Troponin I: <0.03 ng/mL (ref <0.03)

## 2016-06-22 MED ORDER — ONDANSETRON HCL 4 MG PO TABS
4.0000 mg | ORAL_TABLET | Freq: Three times a day (TID) | ORAL | Status: DC
  Administered 2016-06-22 – 2016-06-23 (×3): 4 mg via ORAL
  Filled 2016-06-22 (×3): qty 1

## 2016-06-22 MED ORDER — ONDANSETRON HCL 4 MG/2ML IJ SOLN
4.0000 mg | Freq: Four times a day (QID) | INTRAMUSCULAR | Status: DC | PRN
  Administered 2016-06-23: 06:00:00 4 mg via INTRAVENOUS
  Filled 2016-06-22: qty 2

## 2016-06-22 MED ORDER — ZOLPIDEM TARTRATE 5 MG PO TABS
5.0000 mg | ORAL_TABLET | Freq: Every evening | ORAL | Status: DC | PRN
  Administered 2016-06-22: 5 mg via ORAL
  Filled 2016-06-22: qty 1

## 2016-06-22 NOTE — Consult Note (Signed)
[place]  [phone] [phone]) [phone]  ID: Sanela Evola OB: [birth year]  MR#: [id number]  TDD#:[id number]  Patient Care Team: Rubye Beach as PCP - General (Family Medicine) Allyne Gee, MD as Referring Physician (Internal Medicine)  CHIEF COMPLAINT: Right lower lobe lung cancer metastatic to brain  INTERVAL HISTORY: Patient is a 61 year old female with known stage IV metastatic lung cancer who recently presented to the clinic complaining of left facial droop and left-sided weakness. Subsequent MRI revealed new metastatic lesions in her brain with surrounding vasogenic edema. Patient was subsequently admitted and placed on IV Decadron with improvement of her symptoms. Her pain is significantly better controlled since initiating oxycodone last week. She has no other neurologic complaints. She denies any recent fevers. She has a poor appetite, but denies weight loss. She has no chest pain, shortness breath, cough, or hemoptysis. She denies any diarrhea or constipation. She has no urinary complaints. Patient otherwise feels well and offers no further specific complaints.   REVIEW OF SYSTEMS:   Review of Systems  Constitutional: Positive for malaise/fatigue. Negative for fever and weight loss.  Respiratory: Negative.  Negative for cough and shortness of breath.   Cardiovascular: Negative.  Negative for chest pain and leg swelling.  Gastrointestinal: Negative.  Negative for abdominal pain.  Genitourinary: Negative.   Musculoskeletal: Negative.   Skin: Negative.  Negative for rash.  Neurological: Positive for focal weakness and weakness. Negative for speech change and headaches.  Psychiatric/Behavioral: The patient is nervous/anxious.     As per HPI. Otherwise, a complete review of systems is negative.  PAST MEDICAL HISTORY: Past Medical History:  Diagnosis Date  . Anxiety   . Cancer of right lung ([place]) 02/13/2015  . Depression     . Depression with anxiety    Well controlled with Wellbutrin and Buspar  . Pneumonia     PAST SURGICAL HISTORY: Past Surgical History:  Procedure Laterality Date  . ECTOPIC PREGNANCY SURGERY  1988  . ELECTROMAGNETIC NAVIGATION BROCHOSCOPY N/A 02/10/2015   Procedure: ELECTROMAGNETIC NAVIGATION BRONCHOSCOPY;  Surgeon: Flora Lipps, MD;  Location: [facility];  Service: Cardiopulmonary;  Laterality: N/A;  . ENDOBRONCHIAL ULTRASOUND N/A 02/10/2015   Procedure: ENDOBRONCHIAL ULTRASOUND;  Surgeon: Flora Lipps, MD;  Location: [facility];  Service: Cardiopulmonary;  Laterality: N/A;  . PERIPHERAL VASCULAR CATHETERIZATION N/A 03/05/2015   Procedure: Glori Luis Cath Insertion;  Surgeon: Katha Cabal, MD;  Location: [place] [facility];  Service: Cardiovascular;  Laterality: N/A;    FAMILY HISTORY: Family History  Problem Relation Age of Onset  . Cancer Father        Prostate Cancer  . Hypertension Father   . Stroke Father   . Hypertension Brother     ADVANCED DIRECTIVES (Y/N):  @ADVDIR @  HEALTH MAINTENANCE: Social History  Substance Use Topics  . Smoking status: Former Smoker    Packs/day: 0.25    Years: 30.00    Types: Cigarettes    Quit date: 02/06/2005  . Smokeless tobacco: Never Used  . Alcohol use 3.0 oz/week    5 Cans of beer per week     Comment: 5 beers weekly     Colonoscopy:  PAP:  Bone density:  Lipid panel:  No Known Allergies  Current Facility-Administered Medications  Medication Dose Route Frequency Provider Last Rate Last Dose  . acetaminophen (TYLENOL) tablet 650 mg  650 mg Oral Q6H PRN Gouru, Aruna, MD       Or  . acetaminophen (TYLENOL) suppository 650  mg  650 mg Rectal Q6H PRN Gouru, Aruna, MD      . busPIRone (BUSPAR) tablet 5 mg  5 mg Oral BID Gouru, Aruna, MD   5 mg at 06/22/16 0925  . dexamethasone (DECADRON) injection 10 mg  10 mg Intravenous Q6H Gouru, Aruna, MD   10 mg at 06/22/16 1233  . enoxaparin (LOVENOX) injection 40 mg  40 mg Subcutaneous Q24H  Gouru, Aruna, MD   40 mg at 06/21/16 2321  . mometasone-formoterol (DULERA) 100-5 MCG/ACT inhaler 2 puff  2 puff Inhalation BID Nicholes Mango, MD   2 puff at 06/22/16 0925  . ondansetron (ZOFRAN) injection 4 mg  4 mg Intravenous Q6H PRN Fritzi Mandes, MD      . ondansetron [facility]) tablet 4 mg  4 mg Oral TID AC Fritzi Mandes, MD      . oxyCODONE (Oxy IR/ROXICODONE) immediate release tablet 10 mg  10 mg Oral Q6H PRN Gouru, Aruna, MD   10 mg at 06/22/16 0925  . pantoprazole (PROTONIX) injection 40 mg  40 mg Intravenous Q24H Gouru, Aruna, MD   40 mg at 06/21/16 1827  . prochlorperazine (COMPAZINE) tablet 10 mg  10 mg Oral Q6H PRN Gouru, Aruna, MD   10 mg at 06/22/16 0314  . tiotropium (SPIRIVA) inhalation capsule 18 mcg  1 capsule Inhalation Daily Gouru, Aruna, MD   18 mcg at 06/22/16 0925    OBJECTIVE: Vitals:   06/22/16 0751 06/22/16 1229  BP: 133/71 112/70  Pulse: 71 84  Resp:  18  Temp: 97.8 F (36.6 C) 98.3 F (36.8 C)     Body mass index is 22.88 kg/m.    ECOG FS:2 - Symptomatic, <50% confined to bed  General: Well-developed, well-nourished, no acute distress. Eyes: Pink conjunctiva, anicteric sclera. HEENT: Normocephalic, moist mucous membranes, clear oropharnyx. Lungs: Clear to auscultation bilaterally. Heart: Regular rate and rhythm. No rubs, murmurs, or gallops. Abdomen: Soft, nontender, nondistended. No organomegaly noted, normoactive bowel sounds. Musculoskeletal: No edema, cyanosis, or clubbing. Neuro: Alert, answering all questions appropriately. Left facial droop improved. Skin: No rashes or petechiae noted. Psych: Normal affect.   LAB RESULTS:  Lab Results  Component Value Date   NA 133 (L) 06/22/2016   K 4.4 06/22/2016   CL 98 (L) 06/22/2016   CO2 23 06/22/2016   GLUCOSE 144 (H) 06/22/2016   BUN 25 (H) 06/22/2016   CREATININE 0.86 06/22/2016   CALCIUM 9.8 06/22/2016   PROT 7.4 06/22/2016   ALBUMIN 4.4 06/22/2016   AST 31 06/22/2016   ALT 26 06/22/2016    ALKPHOS 44 06/22/2016   BILITOT 0.9 06/22/2016   GFRNONAA >60 06/22/2016   GFRAA >60 06/22/2016    Lab Results  Component Value Date   WBC 1.2 (LL) 06/22/2016   NEUTROABS 1.7 06/21/2016   HGB 9.1 (L) 06/22/2016   HCT 24.8 (L) 06/22/2016   MCV 105.7 (H) 06/22/2016   PLT 193 06/22/2016     STUDIES: Ct Head Wo Contrast  Result Date: 06/21/2016 CLINICAL DATA:  Left facial droop and numbness left arm. Metastatic lung cancer EXAM: CT HEAD WITHOUT CONTRAST TECHNIQUE: Contiguous axial images were obtained from the base of the skull through the vertex without intravenous contrast. COMPARISON:  MRI 06/17/2016 FINDINGS: Brain: No metastatic disease to the brain. Right frontal mass lesion with surrounding vasogenic edema similar to the prior MRI. This is causing 5 mm midline shift to the left. No other areas of edema. Small enhancing lesions are present elsewhere in the brain compatible  with metastatic disease as noted on MRI. Negative for hemorrhage. Negative for hydrocephalus. Vascular: Negative for hyperdense vessel Skull: Negative Sinuses/Orbits: Negative Other: None IMPRESSION: Known metastatic disease to the brain. Right frontal mass lesion with large amount of edema unchanged from the recent MRI. Negative for acute hemorrhage. Electronically Signed   By: Franchot Gallo M.D.   On: 06/21/2016 13:35   Mr Jeri Cos FX Contrast  Result Date: 06/17/2016 CLINICAL DATA:  Initial evaluation for left-sided facial droop. EXAM: MRI HEAD WITHOUT AND WITH CONTRAST TECHNIQUE: Multiplanar, multiecho pulse sequences of the brain and surrounding structures were obtained without and with intravenous contrast. CONTRAST:  28mL MULTIHANCE GADOBENATE DIMEGLUMINE 529 MG/ML IV SOLN COMPARISON:  Prior MRI from 04/16/2016. FINDINGS: Brain: Dominant posterior right frontal lobe metastatic lesion with central necrosis again seen. This lesion is increased in size on today's exam now measuring 1.8 x 1.8 x 2.1 cm (series 12, image  11). Associated vasogenic edema has markedly worsened throughout the right cerebral hemisphere, extending into the right basal ganglia. Mass effect on the adjacent right lateral ventricle which is partially effaced. Associated right-to-left shift of up to 4 mm. No hydrocephalus or ventricular trapping. Basilar cisterns remain patent. Additional posterior left frontal lesion stable to slightly increased in size measuring 4-5 mm (series 12, image 128). Additional left parietal lobe metastatic lesion slightly increased in size measuring 6 mm (series 12, image 107). Faint punctate lesion within the deep white matter of the left parietal region grossly stable (series 12, image 102). There is a new/more prominent lesion within the right cerebellar hemisphere measuring 5 mm (series 12, image 40), likely present on previous exam, although not delineated at that time. No evidence for acute infarct. Gray-white matter differentiation otherwise maintained. No extra-axial fluid collection. No other new acute intracranial hemorrhage. Pituitary gland within normal limits. Vascular: Major intracranial vascular flow voids are maintained. Skull and upper cervical spine: Craniocervical junction normal. Visualized upper cervical spine unremarkable. Bone marrow signal intensity within normal limits. No scalp soft tissue abnormality. Sinuses/Orbits: Globes and orbital soft tissues within normal limits. Paranasal sinuses and mastoids are clear. Inner ear structures normal. IMPRESSION: 1. Interval enlargement in dominant right posterior frontal metastatic lesion, with markedly worsened vasogenic edema and mass effect with new 4 mm right-to-left midline shift. No hydrocephalus or ventricular trapping at this time. 2. Otherwise stable to slightly increased size of additional left parietal metastases as above, with new/increased size of 5 mm right cerebellar metastatic lesion. Changes are compatible with worsened disease. Electronically  Signed   By: Jeannine Boga M.D.   On: 06/17/2016 19:33    ASSESSMENT: Right lower lobe lung cancer metastatic to brain  PLAN:    1. Right lower lobe lung cancer metastatic to brain:  T4 N2 M1-adenocarcinoma. Patient received cycle 3 of carboplatinum and Taxol earlier this week. She did not receive Neulasta with her treatment secondary to significant side effects after cycle 1. She has been instructed to keep her previously scheduled follow-up appointment with Dr. Rogue Bussing in 2 weeks for consideration of cycle 4. 2. Brain metastasis:MRI results reviewed independently and reported as above. Patient has been started on IV Decadron with significant improvement of her symptoms. Appreciate radiation oncology input. Patient was set to be simulated today, but declined intervention and states she is returning to [facility] in [place] for repeat treatment. Patient's initial brain metastasis were also treated at the [facility]. Please ensure patient is on oral Decadron upon discharge. 3. Disposition: Okay to discharge from  an oncology standpoint. Patient understands the risks of flying and travel. 4. Pain: Continue current narcotic regimen. 5. Leukopenia: Patient received chemotherapy earlier this week. She did NOT receive Neulasta with her treatment.  Appreciate consult, call with questions.   Lloyd Huger, MD   06/22/2016 12:37 PM

## 2016-06-22 NOTE — Progress Notes (Deleted)
Unable to document, will create a new note due to computer screen frozen.

## 2016-06-22 NOTE — Progress Notes (Signed)
Deanna Blair at [facility] NAME: Deanna Blair    MR#:  [id number]  DATE OF BIRTH:  1955/02/09  SUBJECTIVE:   Cont nausea and weakness REVIEW OF SYSTEMS:   Review of Systems  Constitutional: Negative for chills, fever and weight loss.  HENT: Negative for ear discharge, ear pain and nosebleeds.   Eyes: Negative for blurred vision, pain and discharge.  Respiratory: Negative for sputum production, shortness of breath, wheezing and stridor.   Cardiovascular: Negative for chest pain, palpitations, orthopnea and PND.  Gastrointestinal: Positive for nausea. Negative for abdominal pain, diarrhea and vomiting.  Genitourinary: Negative for frequency and urgency.  Musculoskeletal: Negative for back pain and joint pain.  Neurological: Positive for weakness. Negative for sensory change, speech change and focal weakness.  Psychiatric/Behavioral: Negative for depression and hallucinations. The patient is not nervous/anxious.    Tolerating Diet: CLD Tolerating PT: pending  DRUG ALLERGIES:  No Known Allergies  VITALS:  Blood pressure 112/70, pulse 84, temperature 98.3 F (36.8 C), temperature source Oral, resp. rate 18, height 5\' 2"  (1.575 m), weight 56.7 kg (125 lb 1.6 oz), last menstrual period 06/16/2000, SpO2 95 %.  PHYSICAL EXAMINATION:   Physical Exam  GENERAL:  61 y.o.-year-old patient lying in the bed with no acute distress.  EYES: Pupils equal, round, reactive to light and accommodation. No scleral icterus. Extraocular muscles intact.  HEENT: Head atraumatic, normocephalic. Oropharynx and nasopharynx clear.  NECK:  Supple, no jugular venous distention. No thyroid enlargement, no tenderness.  LUNGS: Normal breath sounds bilaterally, no wheezing, rales, rhonchi. No use of accessory muscles of respiration.  CARDIOVASCULAR: S1, S2 normal. No murmurs, rubs, or gallops.  ABDOMEN: Soft, nontender, nondistended. Bowel sounds present. No  organomegaly or mass.  EXTREMITIES: No cyanosis, clubbing or edema b/l.    NEUROLOGIC: Cranial nerves II through XII are intact. No focal Motor or sensory deficits b/l.   PSYCHIATRIC:  patient is alert and oriented x 3.  SKIN: No obvious rash, lesion, or ulcer.   LABORATORY PANEL:  CBC  Recent Labs Lab 06/22/16 0359  WBC 1.2*  HGB 9.1*  HCT 24.8*  PLT 193    Chemistries   Recent Labs Lab 06/22/16 0359  NA 133*  K 4.4  CL 98*  CO2 23  GLUCOSE 144*  BUN 25*  CREATININE 0.86  CALCIUM 9.8  AST 31  ALT 26  ALKPHOS 44  BILITOT 0.9   Cardiac Enzymes  Recent Labs Lab 06/22/16 0359  TROPONINI <0.03   RADIOLOGY:  Ct Head Wo Contrast  Result Date: 06/21/2016 CLINICAL DATA:  Left facial droop and numbness left arm. Metastatic lung cancer EXAM: CT HEAD WITHOUT CONTRAST TECHNIQUE: Contiguous axial images were obtained from the base of the skull through the vertex without intravenous contrast. COMPARISON:  MRI 06/17/2016 FINDINGS: Brain: No metastatic disease to the brain. Right frontal mass lesion with surrounding vasogenic edema similar to the prior MRI. This is causing 5 mm midline shift to the left. No other areas of edema. Small enhancing lesions are present elsewhere in the brain compatible with metastatic disease as noted on MRI. Negative for hemorrhage. Negative for hydrocephalus. Vascular: Negative for hyperdense vessel Skull: Negative Sinuses/Orbits: Negative Other: None IMPRESSION: Known metastatic disease to the brain. Right frontal mass lesion with large amount of edema unchanged from the recent MRI. Negative for acute hemorrhage. Electronically Signed   By: Franchot Gallo M.D.   On: 06/21/2016 13:35   ASSESSMENT AND PLAN:  Deanna Blair  is a 61 y.o. female with a known history of Metastatic lung cancer with brain metastases is getting chemotherapy and last chemotherapy was last week with no issues. Presenting today with intractable nausea and vomiting facial droop and  weakness. The patient had MRI done 4 days ago which has revealed significant edema at around known metastases. CT head has revealed Right frontal mass lesion with large amount of edema unchanged from the recent MRI. Negative for acute hemorrhage  #Metastatic lung cancer with brain metastases, facial droop, weakness and cerebral edema Decadron 10 g IV every 6 hours Pt seen by Dr Claudie Leach [facility] oncology and pt has decided to pursue further rx in [facility] clinic, [place] -she is getting chemo at [facility] center  #Intractable nausea and vomiting from cerebral edema Antiemetics and gentle hydration with IV fluids Supportive treatment  #Hypokalemia replete and recheck  #GI prophylaxis Protonix   Case discussed with Care Management/Social Worker. Management plans discussed with the patient, family and they are in agreement.  CODE STATUS: FULL  DVT Prophylaxis: lovenox  TOTAL TIME TAKING CARE OF THIS PATIENT: *30* minutes.  >50% time spent on counselling and coordination of care  POSSIBLE D/C IN *1-2* DAYS, DEPENDING ON CLINICAL CONDITION.  Note: This dictation was prepared with Dragon dictation along with smaller phrase technology. Any transcriptional errors that result from this process are unintentional.  Deanna Blair M.D on 06/22/2016 at 8:12 PM  Between 7am to 6pm - Pager - [phone]  After 6pm go to [email or web address] - password [email or web address] [facility]  Office  [phone]  CC: Primary care physician; Mar Daring, PA-C

## 2016-06-22 NOTE — Progress Notes (Signed)
WBC reported 1.2. MD Marcille Blanco made aware. No interventions ordered. Will continue to monitor.

## 2016-06-22 NOTE — Consult Note (Signed)
[facility] Cardiology  CARDIOLOGY CONSULT NOTE  Patient ID: Deanna Blair MRN: [id number] DOB/AGE: 03/28/1955 61 y.o.  Admit date: 06/21/2016 Referring Physician Gouru Primary Physician Marlyn Corporal Clearnce Sorrel, PA-C  Primary Cardiologist Nehemiah Massed Reason for Consultation Abnormal ECG  HPI: 61 year old female referred for abnormal ECG. The patient has a history of metastatic lung cancer with metastasis to the brain, currently undergoing chemotherapy. She denies a history of MI, stroke, or prior cardiac catheterization. The patient presented to [facility] yesterday for intractable nausea and vomiting, which she has been having, as well as unilateral facial droop and weakness. Admission labs notable for negative troponin x 4. ECG revealed sinus tachycardia at a rate of 104 bpm and ST depression in inferolateral leads. CT head revealed right frontal mass lesion with large amount of edema, which was unchanged from the recent MRI. The patient underwent stress echocardiogram 12/2014, which was negative for evidence of ischemia. 2D echocardiogram 01/08/2015 revealed normal left ventricular function LVEF 55%. Currently, the patient denies chest pain or shortness of breath. She complains of nausea and weakness.  Review of systems complete and found to be negative unless listed above     Past Medical History:  Diagnosis Date  . Anxiety   . Cancer of right lung ([place]) 02/13/2015  . Depression   . Depression with anxiety    Well controlled with Wellbutrin and Buspar  . Pneumonia     Past Surgical History:  Procedure Laterality Date  . ECTOPIC PREGNANCY SURGERY  1988  . ELECTROMAGNETIC NAVIGATION BROCHOSCOPY N/A 02/10/2015   Procedure: ELECTROMAGNETIC NAVIGATION BRONCHOSCOPY;  Surgeon: Flora Lipps, MD;  Location: [facility] ORS;  Service: Cardiopulmonary;  Laterality: N/A;  . ENDOBRONCHIAL ULTRASOUND N/A 02/10/2015   Procedure: ENDOBRONCHIAL ULTRASOUND;  Surgeon: Flora Lipps, MD;  Location: [facility] ORS;  Service:  Cardiopulmonary;  Laterality: N/A;  . PERIPHERAL VASCULAR CATHETERIZATION N/A 03/05/2015   Procedure: Glori Luis Cath Insertion;  Surgeon: Katha Cabal, MD;  Location: [facility];  Service: Cardiovascular;  Laterality: N/A;    Prescriptions Prior to Admission  Medication Sig Dispense Refill Last Dose  . acetaminophen (TYLENOL) 500 MG tablet Take 1,000 mg by mouth every 4 (four) hours as needed.    PRN at PRN  . busPIRone (BUSPAR) 5 MG tablet Take 1 tablet (5 mg total) by mouth 2 (two) times daily. 60 tablet 6 PRN at PRN  . cyclobenzaprine (FLEXERIL) 5 MG tablet Take 5 mg by mouth 3 (three) times daily as needed for muscle spasms.   PRN at PRN  . docusate sodium (COLACE) 100 MG capsule Take 100 mg by mouth 2 (two) times daily.   PRN at PRN  . ibuprofen (ADVIL,MOTRIN) 800 MG tablet Take 800 mg by mouth every 8 (eight) hours as needed for mild pain or moderate pain.   PRN at PRN  . lidocaine-prilocaine (EMLA) cream Apply 1 application topically as needed. 30 g 2 PRN at PRN  . Oxycodone HCl 10 MG TABS Take 1 tablet (10 mg total) by mouth every 6 (six) hours. 30 tablet 0 PRN at PRN  . prochlorperazine (COMPAZINE) 10 MG tablet Take 1 tablet (10 mg total) by mouth every 6 (six) hours as needed for nausea or vomiting. 40 tablet 1 PRN at PRN  . SPIRIVA RESPIMAT 1.25 MCG/ACT AERS Inhale 1 puff into the lungs daily.    PRN at PRN  . SYMBICORT 80-4.5 MCG/ACT inhaler Inhale 1 puff into the lungs every 12 (twelve) hours.    PRN at PRN  .  zolpidem (AMBIEN) 5 MG tablet Take 1 tablet (5 mg total) by mouth at bedtime as needed. for sleep 30 tablet 2 PRN at PRN  . ondansetron (ZOFRAN) 8 MG tablet Take 1 tablet (8 mg total) by mouth every 8 (eight) hours as needed for nausea or vomiting (start 3 days; after chemo). (Patient not taking: Reported on 06/21/2016) 40 tablet 1 Completed Course at Unknown time   Social History   Social History  . Marital status: Married    Spouse name: Herbie Baltimore  . Number of  children: 1  . Years of education: 15   Occupational History  . 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 Smoking status: Former Smoker    Packs/day: 0.25    Years: 30.00    Types: Cigarettes    Quit date: 02/06/2005  . Smokeless tobacco: Never Used  . Alcohol use 3.0 oz/week    5 Cans of beer per week     Comment: 5 beers weekly  . Drug use: No  . Sexual activity: Yes    Partners: Male   Other Topics Concern  . Not on file   Social History Narrative   Deanna Blair was born in [place], [place]. She moved with her family with [facility] and lived there for 13 years. She recently moved to [place] approximately 1 year ago. She lives with her husband of 28 years. They have an adult daughter who lives in [place]. 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 She is enjoying driving around and getting to know [place]. She and her husband enjoy cooking and baking. They also enjoy hiking when the weather permits.    Family History  Problem Relation Age of Onset  . Cancer Father        Prostate Cancer  . Hypertension Father   . Stroke Father   . Hypertension Brother       Review of systems complete and found to be negative unless listed above      PHYSICAL EXAM  General: Resting in bed, In no acute distress HEENT:  Normocephalic and atramatic Neck:  No JVD.  Lungs: Clear bilaterally to auscultation, normal effort of breathing Heart: HRRR . Normal S1 and S2 without gallops or murmurs.  Abdomen: Bowel sounds are positive, abdomen soft and non-tender  Msk:  Back normal, able to roll to side without difficulty Extremities: No clubbing, cyanosis or edema.   Neuro: Alert and oriented X 3. Psych:  Good affect, responds appropriately  Labs:   Lab Results  Component Value Date   WBC 1.2 (LL) 06/22/2016   HGB 9.1 (L) 06/22/2016   HCT 24.8 (L) 06/22/2016   MCV 105.7 (H) 06/22/2016   PLT 193 06/22/2016    Recent Labs Lab  06/22/16 0359  NA 133*  K 4.4  CL 98*  CO2 23  BUN 25*  CREATININE 0.86  CALCIUM 9.8  PROT 7.4  BILITOT 0.9  ALKPHOS 44  ALT 26  AST 31  GLUCOSE 144*   Lab Results  Component Value Date   TROPONINI <0.03 06/22/2016    Lab Results  Component Value Date   CHOL 166 12/11/2014   CHOL 222 (H) 01/03/2013   Lab Results  Component Value Date   HDL 46 12/11/2014   HDL 57.10 01/03/2013   Lab Results  Component Value Date   LDLCALC 101 (H) 12/11/2014   Lab Results  Component Value Date   TRIG  95 12/11/2014   TRIG 99.0 01/03/2013   Lab Results  Component Value Date   CHOLHDL 3.6 12/11/2014   CHOLHDL 4 01/03/2013   Lab Results  Component Value Date   LDLDIRECT 151.9 01/03/2013      Radiology: Ct Head Wo Contrast  Result Date: 06/21/2016 CLINICAL DATA:  Left facial droop and numbness left arm. Metastatic lung cancer EXAM: CT HEAD WITHOUT CONTRAST TECHNIQUE: Contiguous axial images were obtained from the base of the skull through the vertex without intravenous contrast. COMPARISON:  MRI 06/17/2016 FINDINGS: Brain: No metastatic disease to the brain. Right frontal mass lesion with surrounding vasogenic edema similar to the prior MRI. This is causing 5 mm midline shift to the left. No other areas of edema. Small enhancing lesions are present elsewhere in the brain compatible with metastatic disease as noted on MRI. Negative for hemorrhage. Negative for hydrocephalus. Vascular: Negative for hyperdense vessel Skull: Negative Sinuses/Orbits: Negative Other: None IMPRESSION: Known metastatic disease to the brain. Right frontal mass lesion with large amount of edema unchanged from the recent MRI. Negative for acute hemorrhage. Electronically Signed   By: Franchot Gallo M.D.   On: 06/21/2016 13:35   Mr Jeri Cos KZ Contrast  Result Date: 06/17/2016 CLINICAL DATA:  Initial evaluation for left-sided facial droop. EXAM: MRI HEAD WITHOUT AND WITH CONTRAST TECHNIQUE: Multiplanar, multiecho  pulse sequences of the brain and surrounding structures were obtained without and with intravenous contrast. CONTRAST:  72mL MULTIHANCE GADOBENATE DIMEGLUMINE 529 MG/ML IV SOLN COMPARISON:  Prior MRI from 04/16/2016. FINDINGS: Brain: Dominant posterior right frontal lobe metastatic lesion with central necrosis again seen. This lesion is increased in size on today's exam now measuring 1.8 x 1.8 x 2.1 cm (series 12, image 11). Associated vasogenic edema has markedly worsened throughout the right cerebral hemisphere, extending into the right basal ganglia. Mass effect on the adjacent right lateral ventricle which is partially effaced. Associated right-to-left shift of up to 4 mm. No hydrocephalus or ventricular trapping. Basilar cisterns remain patent. Additional posterior left frontal lesion stable to slightly increased in size measuring 4-5 mm (series 12, image 128). Additional left parietal lobe metastatic lesion slightly increased in size measuring 6 mm (series 12, image 107). Faint punctate lesion within the deep white matter of the left parietal region grossly stable (series 12, image 102). There is a new/more prominent lesion within the right cerebellar hemisphere measuring 5 mm (series 12, image 40), likely present on previous exam, although not delineated at that time. No evidence for acute infarct. Gray-white matter differentiation otherwise maintained. No extra-axial fluid collection. No other new acute intracranial hemorrhage. Pituitary gland within normal limits. Vascular: Major intracranial vascular flow voids are maintained. Skull and upper cervical spine: Craniocervical junction normal. Visualized upper cervical spine unremarkable. Bone marrow signal intensity within normal limits. No scalp soft tissue abnormality. Sinuses/Orbits: Globes and orbital soft tissues within normal limits. Paranasal sinuses and mastoids are clear. Inner ear structures normal. IMPRESSION: 1. Interval enlargement in dominant  right posterior frontal metastatic lesion, with markedly worsened vasogenic edema and mass effect with new 4 mm right-to-left midline shift. No hydrocephalus or ventricular trapping at this time. 2. Otherwise stable to slightly increased size of additional left parietal metastases as above, with new/increased size of 5 mm right cerebellar metastatic lesion. Changes are compatible with worsened disease. Electronically Signed   By: Jeannine Boga M.D.   On: 06/17/2016 19:33    EKG: Normal sinus rhythm, rate 87 bpm  ASSESSMENT AND PLAN:  1. Abnormal  ECG which revealed sinus tachycardia at a rate of 104 bpm with ST depression in inferolateral leads, with negative serial troponin x 4 in the absence of chest pain. 2. Metastatic lung cancer with metastasis to the brain 3. Intractable nausea and vomiting  Recommendations: 1. Continue current therapy. 2. 2D echocardiogram 3. Possible Lexiscan as outpatient, pending results of echo   Signed: Clabe Seal, PA-C 06/22/2016, 11:21 AM

## 2016-06-22 NOTE — Plan of Care (Signed)
Problem: Education: Goal: Knowledge of [facility] Education information/materials will improve Outcome: Progressing Patient is improving on her knowledge of the information here at [facility].  Problem: Safety: Goal: Ability to remain free from injury will improve Outcome: Progressing Patient has been free from injury.   Comments: Patient is pleasant. Patient is going to be transferred to the Oncology floor when a bed opens up. Patient has been nauseous and does not feel good.

## 2016-06-22 NOTE — Consult Note (Signed)
CHIEF COMPLAINT: Primary cancer of right lower lobe of lung ([place]) with progression of brain metastases.  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  She has been treated with chemotherapy and immunotherapy.  In November 2017 she underwent an MRI of the brain for staging purposes that revealed several brain metastases.  She received treatment at the [facility] in [place], [place].  Two tumors involving the right frontal and left parietal lobes were treated with stereotactic radiation therapy.  The combined tumor volume at both sites was 0.7 cm3.  The tumor margin dose was 22 Gy.  Patient returns to clinic several days ago after her and her husband noted a left facial droop. It appeared to be subacute in nature having been present for the last 1-2 weeks. Her pain was significantly better controlled since initiating oxycodone last week. She has no other neurologic complaints. She denies any recent fevers. She has a fair appetite, but denies weight loss. She has no chest pain, shortness breath, cough, or hemoptysis. She denies any diarrhea or constipation. She has no urinary complaints. Patient otherwise feels well and offers no further specific complaints.  On 06/17/16 she underwent an MRI of the brain that revealed a 1.8 cm x 1.8 cm x 2.1 cm right frontal lobe metastasis with marked surrounding vasogenic edema and mass effect on the right lateral ventricle.  There was a 52mm right to left shift.  There were several other lesions including a posterior left frontal lesion measuring 4-5 mm, a left parietal lobe lesion measuring 6 mm, a faint punctate lesion in the deep white matter of the  left parietal region and a new 5 mm right cereballar hemisphere metastasis.  She was started on IV Decadron, admitted to [facility] and referred to radiation oncology for recommendations regarding treatment.    REVIEW OF SYSTEMS:   Review of Systems  Constitutional: Positive for malaise/fatigue. Negative for fever and weight loss.  Respiratory: Negative.  Negative for cough and shortness of breath.   Cardiovascular: Negative.  Negative for chest pain and leg swelling.  Gastrointestinal: Negative for abdominal pain and nausea.  Musculoskeletal: Positive for back pain and joint pain. Negative for myalgias.  Skin: Negative.  Negative for rash.  Neurological: Positive for focal weakness and weakness.  Psychiatric/Behavioral: Positive for depression. The patient is nervous/anxious.     As per HPI. Otherwise, a complete review of systems is negative.  PAST MEDICAL HISTORY:     Past Medical History:  Diagnosis Date  . Anxiety   . Cancer of right lung ([place]) 02/13/2015  . Depression   . Depression with anxiety    Well controlled with Wellbutrin and Buspar  . Pneumonia     PAST SURGICAL HISTORY:      Past Surgical History:  Procedure Laterality Date  . ECTOPIC PREGNANCY SURGERY  1988  . ELECTROMAGNETIC NAVIGATION BROCHOSCOPY N/A 02/10/2015   Procedure: ELECTROMAGNETIC NAVIGATION BRONCHOSCOPY;  Surgeon: Flora Lipps, MD;  Location: [facility] ORS;  Service: Cardiopulmonary;  Laterality: N/A;  . ENDOBRONCHIAL ULTRASOUND N/A 02/10/2015   Procedure: ENDOBRONCHIAL ULTRASOUND;  Surgeon: Flora Lipps, MD;  Location: [facility] ORS;  Service: Cardiopulmonary;  Laterality: N/A;  .  PERIPHERAL VASCULAR CATHETERIZATION N/A 03/05/2015   Procedure: Glori Luis Cath Insertion;  Surgeon: Katha Cabal, MD;  Location: [facility];  Service: Cardiovascular;  Laterality: N/A;    FAMILY HISTORY:      Family History  Problem Relation Age of Onset  . Cancer Father         Prostate Cancer  . Hypertension Father   . Stroke Father   . Hypertension Brother     ADVANCED DIRECTIVES (Y/N):  N  HEALTH MAINTENANCE:        Social History  Substance Use Topics  . Smoking status: Former Smoker    Packs/day: 0.25    Years: 30.00    Types: Cigarettes    Quit date: 02/06/2005  . Smokeless tobacco: Never Used  . Alcohol use 3.0 oz/week     5 Cans of beer per week      Comment: 5 beers weekly                Colonoscopy:             PAP:             Bone density:             Lipid panel:  No Known Allergies  No current facility-administered medications for this visit.          Current Outpatient Prescriptions  Medication Sig Dispense Refill  . acetaminophen (TYLENOL) 500 MG tablet Take 1,000 mg by mouth every 4 (four) hours as needed.     . busPIRone (BUSPAR) 5 MG tablet Take 1 tablet (5 mg total) by mouth 2 (two) times daily. 60 tablet 6  . docusate sodium (COLACE) 100 MG capsule Take 100 mg by mouth 2 (two) times daily.    [name] Kitchen ibuprofen (ADVIL,MOTRIN) 800 MG tablet Take 800 mg by mouth every 8 (eight) hours as needed for mild pain or moderate pain.    [name] Kitchen lidocaine-prilocaine (EMLA) cream Apply 1 application topically as needed. 30 g 2  . ondansetron (ZOFRAN) 8 MG tablet Take 1 tablet (8 mg total) by mouth every 8 (eight) hours as needed for nausea or vomiting (start 3 days; after chemo). (Patient not taking: Reported on 06/21/2016) 40 tablet 1  . Oxycodone HCl 10 MG TABS Take 1 tablet (10 mg total) by mouth every 6 (six) hours. 30 tablet 0  . prochlorperazine (COMPAZINE) 10 MG tablet Take 1 tablet (10 mg total) by mouth every 6 (six) hours as needed for nausea or vomiting. 40 tablet 1  . SPIRIVA RESPIMAT 1.25 MCG/ACT AERS Inhale 1 puff into the lungs daily.     . SYMBICORT 80-4.5 MCG/ACT inhaler Inhale 1 puff into the lungs every 12 (twelve) hours.     [name] Kitchen zolpidem (AMBIEN) 5 MG tablet Take 1 tablet (5 mg total) by mouth at  bedtime as needed. for sleep 30 tablet 2            Facility-Administered Medications Ordered in Other Visits  Medication Dose Route Frequency Provider Last Rate Last Dose  . dexamethasone (DECADRON) injection 10 mg  10 mg Intravenous Once Schuyler Amor, MD      . ondansetron [facility]) injection 4 mg  4 mg Intravenous Once Charlotte Crumb A, MD      . sodium chloride 0.9 % bolus 500 mL  500 mL Intravenous Once Schuyler Amor, MD        OBJECTIVE:    Vitals:   06/17/16 1419  BP: 129/77  Pulse: 69  Temp: 97.3 F (36.3 C)     There is no height or weight on file to calculate BMI.    ECOG FS:1 - Symptomatic but completely ambulatory  General: Well-developed, well-nourished, no acute distress. Patient is accompanied today in radiation oncology by her loving and very supportive husband. Eyes: Pink conjunctiva, anicteric sclera. Opthalmic examination reveals the discs to be sharp bilaterally without evidence of papilledema.   Neuro: Cranial nerves II-XII are intact except for a slight facial droop on the left.  Grip strength in the left upper extremity is slightly reduced as is strength on flexion, extension, abduction and adduction in the left upper extremity.  Otherwise, strength is 5/5 bilaterally throughout.   Lungs: Clear to auscultation bilaterally. Heart: Regular rate and rhythm. No rubs, murmurs, or gallops. Abdomen: Soft, nontender, nondistended. No organomegaly noted, normoactive bowel sounds. Musculoskeletal: No edema, cyanosis, or clubbing. Neuro: Alert, answering all questions appropriately. Left facial droop noted.  Skin: No rashes or petechiae noted. Psych: Normal affect.   LAB RESULTS:  Recent Labs       Lab Results  Component Value Date   NA 133 (L) 06/21/2016   K 3.3 (L) 06/21/2016   CL 97 (L) 06/21/2016   CO2 25 06/21/2016   GLUCOSE 128 (H) 06/21/2016   BUN 23 (H) 06/21/2016   CREATININE 0.83 06/21/2016   CALCIUM 10.1 06/21/2016   PROT 7.9  06/21/2016   ALBUMIN 4.7 06/21/2016   AST 48 (H) 06/21/2016   ALT 33 06/21/2016   ALKPHOS 49 06/21/2016   BILITOT 1.4 (H) 06/21/2016   GFRNONAA >60 06/21/2016   GFRAA >60 06/21/2016      Recent Labs       Lab Results  Component Value Date   WBC 2.5 (L) 06/21/2016   NEUTROABS 1.7 06/21/2016   HGB 10.2 (L) 06/21/2016   HCT 28.3 (L) 06/21/2016   MCV 106.8 (H) 06/21/2016   PLT 225 06/21/2016       STUDIES:  Imaging Results  Ct Head Wo Contrast  Result Date: 06/21/2016 CLINICAL DATA:  Left facial droop and numbness left arm. Metastatic lung cancer EXAM: CT HEAD WITHOUT CONTRAST TECHNIQUE: Contiguous axial images were obtained from the base of the skull through the vertex without intravenous contrast. COMPARISON:  MRI 06/17/2016 FINDINGS: Brain: No metastatic disease to the brain. Right frontal mass lesion with surrounding vasogenic edema similar to the prior MRI. This is causing 5 mm midline shift to the left. No other areas of edema. Small enhancing lesions are present elsewhere in the brain compatible with metastatic disease as noted on MRI. Negative for hemorrhage. Negative for hydrocephalus. Vascular: Negative for hyperdense vessel Skull: Negative Sinuses/Orbits: Negative Other: None IMPRESSION: Known metastatic disease to the brain. Right frontal mass lesion with large amount of edema unchanged from the recent MRI. Negative for acute hemorrhage. Electronically Signed   By: Franchot Gallo M.D.   On: 06/21/2016 13:35   Mr Jeri Cos DG Contrast  Result Date: 06/17/2016 CLINICAL DATA:  Initial evaluation for left-sided facial droop. EXAM: MRI HEAD WITHOUT AND WITH CONTRAST TECHNIQUE: Multiplanar, multiecho pulse sequences of the brain and surrounding structures were obtained without and with intravenous contrast. CONTRAST:  69mL MULTIHANCE GADOBENATE DIMEGLUMINE 529 MG/ML IV SOLN COMPARISON:  Prior MRI from 04/16/2016. FINDINGS: Brain: Dominant posterior right frontal  lobe metastatic lesion with central necrosis again seen. This lesion is increased in size on today's exam now measuring 1.8 x 1.8 x 2.1 cm (series 12, image 11). Associated  vasogenic edema has markedly worsened throughout the right cerebral hemisphere, extending into the right basal ganglia. Mass effect on the adjacent right lateral ventricle which is partially effaced. Associated right-to-left shift of up to 4 mm. No hydrocephalus or ventricular trapping. Basilar cisterns remain patent. Additional posterior left frontal lesion stable to slightly increased in size measuring 4-5 mm (series 12, image 128). Additional left parietal lobe metastatic lesion slightly increased in size measuring 6 mm (series 12, image 107). Faint punctate lesion within the deep white matter of the left parietal region grossly stable (series 12, image 102). There is a new/more prominent lesion within the right cerebellar hemisphere measuring 5 mm (series 12, image 40), likely present on previous exam, although not delineated at that time. No evidence for acute infarct. Gray-white matter differentiation otherwise maintained. No extra-axial fluid collection. No other new acute intracranial hemorrhage. Pituitary gland within normal limits. Vascular: Major intracranial vascular flow voids are maintained. Skull and upper cervical spine: Craniocervical junction normal. Visualized upper cervical spine unremarkable. Bone marrow signal intensity within normal limits. No scalp soft tissue abnormality. Sinuses/Orbits: Globes and orbital soft tissues within normal limits. Paranasal sinuses and mastoids are clear. Inner ear structures normal. IMPRESSION: 1. Interval enlargement in dominant right posterior frontal metastatic lesion, with markedly worsened vasogenic edema and mass effect with new 4 mm right-to-left midline shift. No hydrocephalus or ventricular trapping at this time. 2. Otherwise stable to slightly increased size of additional left  parietal metastases as above, with new/increased size of 5 mm right cerebellar metastatic lesion. Changes are compatible with worsened disease. Electronically Signed   By: Jeannine Boga M.D.   On: 06/17/2016 19:33     ASSESSMENT & PLAN:  Primary cancer of right lower lobe of lung ([place]) with progression of brain metastases  T4 N2 M1-adenocarcinoma of the lung right side-on cabo-Taxol s/p cycle #2. Tolerated chemo okay- but significant body aches [see discussion below]. No obvious clinical signs of progression.  Radiation Oncology: Today I had an opportunity to spend one hour with Mr. and Mrs. Louro during which time I reviewed with them the diagnosis, prognosis and treatment options. We discussed the rationale for the use of palliative radiation therapy in this setting, the logistics of radiation therapy as well as the potential risks and complications both in the acute and late setting.  I proposed several different viable options with radiation therapy including returning to [facility] in [place], [place] for evaluation and consideration of treatment with another course of stereotactic radiation therapy.  The advantage of this approach would be the fact that her previous radiation therapy was delivered at this institution which would allow them to produce more accurate fields and doses for the re-irradiation thus minimizing the risk of complications.  The patient and her husband were also very pleased with their treatment at this institution.  Alternatively, they could pursue similar stereotactic treatment at [facility] in [place], [place] in [place], [place] or [name] along with several other institutions.  Finally, I also offered them a course of whole brain or partial brain irradiation with IMRT or 3-D conformal radiation therapy at [facility].  Following a lengthy discussion they indicated their desire to proceed with reconsultation and and retreatment at the [facility] in [place].  They indicated that all of their questions had been addressed to their satisfaction.  I gave them my card and urged them to contact me should any questions or problems arise.      # Received  cycle 3 of carboplatinum and Taxol. Neulasta has been discontinued given significant myalgias after cycle 1.  # Bodyaches- question related to onpro; even after use of Claritin prophylactically. Discontinue onpro.   # Nausea/vomitting- G-1-2; Emend added for subsequent cycles.  # Left facial droop: Stat MRI of the brain completed on June 18, 2016 revealed suspicion of progression of disease as well as vasogenic edema. Patient will require Decadron and a radiation oncology referral--done.   # follow up as previously scheduled for consideration of cycle 4.   # thrombocytopenia: Secondary to chemotherapy. Resolved. Monitor.  # Pain: Significantly improved. Continue oxycodone as needed.   Patient expressed understanding and was in agreement with this plan. She also understands that She can call clinic at any time with any questions, concerns, or complaints.   Douglas A. Claudie Leach, M.D., M.P.H.

## 2016-06-23 ENCOUNTER — Ambulatory Visit: Payer: BLUE CROSS/BLUE SHIELD

## 2016-06-23 ENCOUNTER — Telehealth: Payer: Self-pay | Admitting: Physician Assistant

## 2016-06-23 LAB — ECHOCARDIOGRAM COMPLETE
HEIGHTINCHES: 62 in
Weight: 2001.6 oz

## 2016-06-23 LAB — HIV ANTIBODY (ROUTINE TESTING W REFLEX): HIV Screen 4th Generation wRfx: NONREACTIVE

## 2016-06-23 MED ORDER — DEXAMETHASONE 2 MG PO TABS: 10.0000 mg | ORAL_TABLET | Freq: Three times a day (TID) | ORAL | 0 refills | Status: DC

## 2016-06-23 MED ORDER — PROCHLORPERAZINE MALEATE 10 MG PO TABS: 10.0000 mg | ORAL_TABLET | Freq: Four times a day (QID) | ORAL | 1 refills | Status: DC | PRN

## 2016-06-23 MED ORDER — OXYCODONE HCL 10 MG PO TABS: 10.0000 mg | ORAL_TABLET | Freq: Four times a day (QID) | ORAL | 0 refills | Status: DC

## 2016-06-23 MED ORDER — DEXAMETHASONE 4 MG PO TABS
10.0000 mg | ORAL_TABLET | Freq: Three times a day (TID) | ORAL | Status: DC
  Administered 2016-06-23: 10:00:00 10 mg via ORAL
  Filled 2016-06-23: qty 3

## 2016-06-23 NOTE — Telephone Encounter (Signed)
Will you please get telephone transition call?   Thank you!

## 2016-06-23 NOTE — Discharge Summary (Signed)
[facility] [place] NAME: Deanna Blair    MR#:  [id number]  DATE OF BIRTH:  [birth year]  DATE OF ADMISSION:  06/21/2016 ADMITTING PHYSICIAN: Nicholes Mango, MD  DATE OF DISCHARGE: 06/23/2016  PRIMARY CARE PHYSICIAN: Mar Daring, PA-C    ADMISSION DIAGNOSIS:  Vasogenic brain edema (H[facility]) [G93.6] Left-sided weakness [R53.1]  DISCHARGE DIAGNOSIS:  Intractable nausea and vomiting-resolved Vasogenic cerebral edema with know right frontal lobe mass (mets) H/o lung Cancer  SECONDARY DIAGNOSIS:   Past Medical History:  Diagnosis Date  . Anxiety   . Cancer of right lung ([place]) 02/13/2015  . Depression   . Depression with anxiety    Well controlled with Wellbutrin and Buspar  . Pneumonia     HOSPITAL COURSE:  Deanna Blair a 60 y.o. femalewith a known history of Metastatic lung cancer with brain metastases is getting chemotherapy and last chemotherapy was last week with no issues. Presenting today with intractable nausea and vomiting facial droop and weakness.The patient had MRI done 4 days ago which has revealed significant edema at around known metastases. CT head has revealed Right frontal mass lesion with large amount of edema unchanged from the recent MRI. Negative for acute hemorrhage  #Metastatic lung cancer with brain metastases, facial droop, weakness and cerebral edema Decadron 10 g IV every 6 hours---change to oral steroids Pt seen by Dr Claudie Leach [facility] oncology and pt has decided to pursue further rx in [place] [facility], [place] -she is getting chemo at [facility]  #Intractable nausea and vomiting from cerebral edema Antiemetics and gentle hydration with IV fluids Supportive treatment  #Hypokalemia replete and recheck  #GI prophylaxis Protonix  Pt declines PT D/c home if she cont to tolerate po diet CONSULTS OBTAINED:  Treatment Team:  Isaias Cowman, MD  DRUG ALLERGIES:  No Known  Allergies  DISCHARGE MEDICATIONS:   Current Discharge Medication List    START taking these medications   Details  dexamethasone (DECADRON) 2 MG tablet Take 5 tablets (10 mg total) by mouth 3 (three) times daily. Qty: 40 tablet, Refills: 0      CONTINUE these medications which have CHANGED   Details  Oxycodone HCl 10 MG TABS Take 1 tablet (10 mg total) by mouth every 6 (six) hours. Qty: 25 tablet, Refills: 0    prochlorperazine (COMPAZINE) 10 MG tablet Take 1 tablet (10 mg total) by mouth every 6 (six) hours as needed for nausea or vomiting. Qty: 25 tablet, Refills: 1   Associated Diagnoses: Primary cancer of right lower lobe of lung ([place])      CONTINUE these medications which have NOT CHANGED   Details  acetaminophen (TYLENOL) 500 MG tablet Take 1,000 mg by mouth every 4 (four) hours as needed.     busPIRone (BUSPAR) 5 MG tablet Take 1 tablet (5 mg total) by mouth 2 (two) times daily. Qty: 60 tablet, Refills: 6   Associated Diagnoses: GAD (generalized anxiety disorder)    cyclobenzaprine (FLEXERIL) 5 MG tablet Take 5 mg by mouth 3 (three) times daily as needed for muscle spasms.    docusate sodium (COLACE) 100 MG capsule Take 100 mg by mouth 2 (two) times daily.    ibuprofen (ADVIL,MOTRIN) 800 MG tablet Take 800 mg by mouth every 8 (eight) hours as needed for mild pain or moderate pain.    lidocaine-prilocaine (EMLA) cream Apply 1 application topically as needed. Qty: 30 g, Refills: 2    SPIRIVA RESPIMAT 1.25 MCG/ACT AERS Inhale  1 puff into the lungs daily.    Associated Diagnoses: Malignant neoplasm of overlapping sites of right lung (HCC)    SYMBICORT 80-4.5 MCG/ACT inhaler Inhale 1 puff into the lungs every 12 (twelve) hours.    Associated Diagnoses: Malignant neoplasm of overlapping sites of right lung (HCC)    zolpidem (AMBIEN) 5 MG tablet Take 1 tablet (5 mg total) by mouth at bedtime as needed. for sleep Qty: 30 tablet, Refills: 2    ondansetron (ZOFRAN) 8 MG  tablet Take 1 tablet (8 mg total) by mouth every 8 (eight) hours as needed for nausea or vomiting (start 3 days; after chemo). Qty: 40 tablet, Refills: 1   Associated Diagnoses: Primary cancer of right lower lobe of lung ([place])        If you experience worsening of your admission symptoms, develop shortness of breath, life threatening emergency, suicidal or homicidal thoughts you must seek medical attention immediately by calling 911 or calling your MD immediately  if symptoms less severe.  You Must read complete instructions/literature along with all the possible adverse reactions/side effects for all the Medicines you take and that have been prescribed to you. Take any new Medicines after you have completely understood and accept all the possible adverse reactions/side effects.   Please note  You were cared for by a hospitalist during your hospital stay. If you have any questions about your discharge medications or the care you received while you were in the hospital after you are discharged, you can call the unit and asked to speak with the hospitalist on call if the hospitalist that took care of you is not available. Once you are discharged, your primary care physician will handle any further medical issues. Please note that NO REFILLS for any discharge medications will be authorized once you are discharged, as it is imperative that you return to your primary care physician (or establish a relationship with a primary care physician if you do not have one) for your aftercare needs so that they can reassess your need for medications and monitor your lab values. Today   SUBJECTIVE   Doing ok. Slept well, no vomiting  VITAL SIGNS:  Blood pressure 115/71, pulse 71, temperature 98.2 F (36.8 C), temperature source Oral, resp. rate 20, height 5\' 2"  (1.575 m), weight 57.3 kg (126 lb 6.4 oz), last menstrual period 06/16/2000, SpO2 97 %.  I/O:   Intake/Output Summary (Last 24 hours) at 06/23/16  0856 Last data filed at 06/22/16 1856  Gross per 24 hour  Intake              600 ml  Output                0 ml  Net              600 ml    PHYSICAL EXAMINATION:  GENERAL:  61 y.o.-year-old patient lying in the bed with no acute distress.  EYES: Pupils equal, round, reactive to light and accommodation. No scleral icterus. Extraocular muscles intact.  HEENT: Head atraumatic, normocephalic. Oropharynx and nasopharynx clear. alopecia NECK:  Supple, no jugular venous distention. No thyroid enlargement, no tenderness.  LUNGS: Normal breath sounds bilaterally, no wheezing, rales,rhonchi or crepitation. No use of accessory muscles of respiration.  CARDIOVASCULAR: S1, S2 normal. No murmurs, rubs, or gallops.  ABDOMEN: Soft, non-tender, non-distended. Bowel sounds present. No organomegaly or mass.  EXTREMITIES: No pedal edema, cyanosis, or clubbing.  NEUROLOGIC: Cranial nerves II through XII are intact. Muscle  strength 5/5 in all extremities. Sensation intact. Gait not checked.  PSYCHIATRIC: The patient is alert and oriented x 3.  SKIN: No obvious rash, lesion, or ulcer.   DATA REVIEW:   CBC   Recent Labs Lab 06/22/16 0359  WBC 1.2*  HGB 9.1*  HCT 24.8*  PLT 193    Chemistries   Recent Labs Lab 06/22/16 0359  NA 133*  K 4.4  CL 98*  CO2 23  GLUCOSE 144*  BUN 25*  CREATININE 0.86  CALCIUM 9.8  AST 31  ALT 26  ALKPHOS 44  BILITOT 0.9    Microbiology Results   No results found for this or any previous visit (from the past 240 hour(s)).  RADIOLOGY:  Ct Head Wo Contrast  Result Date: 06/21/2016 CLINICAL DATA:  Left facial droop and numbness left arm. Metastatic lung cancer EXAM: CT HEAD WITHOUT CONTRAST TECHNIQUE: Contiguous axial images were obtained from the base of the skull through the vertex without intravenous contrast. COMPARISON:  MRI 06/17/2016 FINDINGS: Brain: No metastatic disease to the brain. Right frontal mass lesion with surrounding vasogenic edema  similar to the prior MRI. This is causing 5 mm midline shift to the left. No other areas of edema. Small enhancing lesions are present elsewhere in the brain compatible with metastatic disease as noted on MRI. Negative for hemorrhage. Negative for hydrocephalus. Vascular: Negative for hyperdense vessel Skull: Negative Sinuses/Orbits: Negative Other: None IMPRESSION: Known metastatic disease to the brain. Right frontal mass lesion with large amount of edema unchanged from the recent MRI. Negative for acute hemorrhage. Electronically Signed   By: Franchot Gallo M.D.   On: 06/21/2016 13:35     Management plans discussed with the patient, family and they are in agreement.  CODE STATUS:     Code Status Orders        Start     Ordered   06/21/16 1752  Full code  Continuous     06/21/16 1751    Code Status History    Date Active Date Inactive Code Status Order ID Comments User Context   This patient has a current code status but no historical code status.      TOTAL TIME TAKING CARE OF THIS PATIENT: *40* minutes.    Ghali Morissette M.D on 06/23/2016 at 8:56 AM  Between 7am to 6pm - Pager - [phone] After 6pm go to [email or web address] - password [email or web address] [facility]  Office  [phone]  CC: Primary care physician; Mar Daring, PA-C

## 2016-06-23 NOTE — Progress Notes (Signed)
Pt is d/ced home.  No c/o nausea this shift.  She has scheduled zofran with meals.  Has tolerated advancement of diet from clear liquid to full.  Advancing to soft for lunch and plan is for her to leave after lunch.  She will continue to have chemo at [facility] but will have rx therapy at [facility] in [place], [place].  Pt refused PT consult.  D/c instructions will be reviewed along w/f/u appts.  She will be transported home by husband.

## 2016-06-23 NOTE — Telephone Encounter (Signed)
Pt is being discharged from [facility] today for Cerebral edema.  I have scheduled a hospital follow up/MW

## 2016-06-23 NOTE — Progress Notes (Signed)
Called report to [place] on 1C. Transported to 104. VSS.

## 2016-06-24 NOTE — Telephone Encounter (Signed)
Absolutely!   LMTCB on patients voicemail.  -MM

## 2016-06-24 NOTE — Telephone Encounter (Signed)
Transition Care Management Follow-up Telephone Call    Date discharged? 06/22/16  How have you been since you were released from the hospital? Not recovering well. Husband states pt is vomiting, nausea and weak. Pt did eat soup today, but vomited it up afterwards. Pt has been taking her anti-nausea medication. Denies fever, fainting and diarrhea. Pt is currently on a soft food diet.   Any patient concerns? When she will start feeling better?    Items Reviewed:  Medications reviewed: Yes  Allergies reviewed: Yes  Dietary changes reviewed: N/A, pt is trying a soft diet by choice  Referrals reviewed: Yes, with Dr. Burlene Arnt on 06/827/18   Functional Questionnaire:  Independent - I Dependent - D    Activities of Daily Living (ADLs):    Personal hygiene - I Dressing - I Eating - I Maintaining continence - I Transferring - I   Independent Activities of Daily Living (iADLs): Basic communication skills - I Transportation - D Meal preparation - I Shopping - D Housework - D Managing medications - D Managing personal finances - D   Confirmed importance and date/time of follow-up visits scheduled YES  Provider Appointment booked with PCP 06/25/16 @ 3:00 PM.  Confirmed with patient if condition begins to worsen call PCP or go to the ER.  Patient was given the office number and encouraged to call back with question or concerns: YES

## 2016-06-25 ENCOUNTER — Encounter: Payer: Self-pay | Admitting: Physician Assistant

## 2016-06-25 ENCOUNTER — Ambulatory Visit (INDEPENDENT_AMBULATORY_CARE_PROVIDER_SITE_OTHER): Payer: BLUE CROSS/BLUE SHIELD | Admitting: Physician Assistant

## 2016-06-25 VITALS — BP 112/80 | HR 91 | Temp 97.5°F | Resp 16 | Wt 120.4 lb

## 2016-06-25 DIAGNOSIS — Z9189 Other specified personal risk factors, not elsewhere classified: Secondary | ICD-10-CM

## 2016-06-25 DIAGNOSIS — C7931 Secondary malignant neoplasm of brain: Secondary | ICD-10-CM

## 2016-06-25 DIAGNOSIS — G936 Cerebral edema: Secondary | ICD-10-CM

## 2016-06-25 DIAGNOSIS — C3431 Malignant neoplasm of lower lobe, right bronchus or lung: Secondary | ICD-10-CM | POA: Diagnosis not present

## 2016-06-25 DIAGNOSIS — IMO0001 Reserved for inherently not codable concepts without codable children: Secondary | ICD-10-CM

## 2016-06-25 NOTE — Patient Instructions (Signed)
Cerebral Edema, Adult Cerebral edema is swelling (edema) in your brain. Cerebral edema is caused by a buildup of fluid in brain tissue in response to a brain injury or condition. Cerebral edema is a medical emergency that is treated at the hospital. What are the causes? Cerebral edema has many causes. Some common causes of cerebral edema include:  Brain injury (trauma).  Lack of oxygen to the brain.  Very high sugar levels caused by diabetes (diabetic ketoacidosis).  Bleeding inside or around the brain.  Blocked blood supply to part of the brain (stroke).  Brain tumor.  Infection from a virus or bacteria.  Very high blood pressure (malignant hypertension).  Carbon monoxide poisoning.  What are the signs or symptoms? Symptoms of cerebral edema depend on the cause. They may include:  Headache and a stiff neck.  Confusion, tiredness, or loss of consciousness.  Nausea or vomiting.  Fever.  Dizziness.  Vision changes.  Weakness or loss of movement in part of the body.  Tingling or numbness in part of the body.  Trouble speaking.  Clumsiness.  Seizure.  Trouble breathing.  How is this diagnosed? This condition is diagnosed based on your symptoms and a physical exam. The exam may include tests to confirm the diagnosis. You may have:  Blood tests.  A procedure to remove fluid from the spinal cord (lumbar puncture or spinal tap).  A procedure to place a pressure monitor inside the skull (intracranial pressure monitor or extraventricular drain).  Imaging studies including: ? MRI. ? CT scan. ? A type of brain X-ray done after having dye injected into an artery (angiogram).  How is this treated? Treatment for cerebral edema aims to reduce swelling and fluid in the brain. The exact treatment depends on the cause. Possible treatments include:  IV fluids that contain a sugar (mannitol) that draws fluid from the brain.  Assisted breathing with control of oxygen  levels (controlled hyperventilation).  Medicine to: ? Increase urine production (diuretic). ? Reduce swelling (steroid). ? Cause deep sleep (sedation).  Raising (elevating) your head when you are lying down.  Procedures to relieve pressure in the brain. These may include placing a drain to remove fluid (extraventriculardrain) or temporarily removing a skull bone (craniotomy).  If the edema is caused by a condition, treatment will also focus on managing the condition. Follow these instructions at home: What you need to do at home will depend on the severity of the cerebral edema. Follow the instructions that your health care provider gives to you. In general:  Learn as much as you can about your condition and work closely with your team of health care providers.  Take over-the-counter and prescription medicines only as told by your health care providers.  Return to your normal activities as told by your health care providers. Ask your health care providers what activities are safe for you.  Manage any other health care conditions you may have, if this applies.  Keep all follow-up visits as told by your health care providers. This is important.  Contact a health care provider if:  You have any new symptoms. Get help right away if:  You have any sudden changes in symptoms.  You have: ? A severe headache. ? Weakness. ? Vision loss. ? Confusion. ? The inability to speak or walk. ? A seizure. This information is not intended to replace advice given to you by your health care provider. Make sure you discuss any questions you have with your health care provider. Document  Released: 07/18/2013 Document Revised: 07/11/2015 Document Reviewed: 05/02/2015 Elsevier Interactive Patient Education  [employer].

## 2016-06-25 NOTE — Progress Notes (Signed)
Patient: Deanna Blair Female    DOB: 01-02-1956   61 y.o.   MRN: [id number] Visit Date: 06/25/2016  Today's Provider: Mar Daring, PA-C   Chief Complaint  Patient presents with  . Hospitalization Follow-up   Subjective:    HPI  Follow up Hospitalization  Patient was admitted to [facility] on 06/21/16 and discharged on 06/23/16 She was treated for Vasogenic Cerebral Edema with known right frontal lobe mass (mets). Known lung cancer. Treatment for this included Dexamethasone 10 g IV every 6 hours--change to oral steroids at discharge. She is still taking steroids currently.  Telephone follow up was done on 06/23/16 She reports excellent compliance with treatment. She reports this condition is Improved.  Nausea is better today, zofran prn. Right sided facial droop is improved today.  She does still report having issues with foods. She reports her appetite is good but since the cerebral edema she has not been able to tolerate as much, either things not tasting right or not being able to get them down. She is down two pounds since 06/23/16. She does have f/u appt with Dr. Rogue Bussing on 07/01/16. She is scheduled for Chest CT on Monday 06/28/16. She does report that her and her husband have decided to stop chemo treatments and seek other alternatives due to side effects. She is also going to start going back to the [facility] for gamma radiation for the right frontal lobe metastasis. This was successful at reducing the mass size previously. She does continue to take the decadron 10mg  3x per day. She does report that today is her best day since discharge, but she is still fatigued and gets nauseous. She is also complaining of increased mucous production in her mouth and throat. They are questioning if this could be a side effect from the zofran since it was changed from the ODT version to the standard tablet.    ------------------------------------------------------------------------------------    No Known Allergies   Current Outpatient Prescriptions:  .  acetaminophen (TYLENOL) 500 MG tablet, Take 1,000 mg by mouth every 4 (four) hours as needed. , Disp: , Rfl:  .  busPIRone (BUSPAR) 5 MG tablet, Take 1 tablet (5 mg total) by mouth 2 (two) times daily., Disp: 60 tablet, Rfl: 6 .  cyclobenzaprine (FLEXERIL) 5 MG tablet, Take 5 mg by mouth 3 (three) times daily as needed for muscle spasms., Disp: , Rfl:  .  dexamethasone (DECADRON) 2 MG tablet, Take 5 tablets (10 mg total) by mouth 3 (three) times daily., Disp: 40 tablet, Rfl: 0 .  docusate sodium (COLACE) 100 MG capsule, Take 100 mg by mouth 2 (two) times daily., Disp: , Rfl:  .  ibuprofen (ADVIL,MOTRIN) 800 MG tablet, Take 800 mg by mouth every 8 (eight) hours as needed for mild pain or moderate pain., Disp: , Rfl:  .  ondansetron (ZOFRAN) 8 MG tablet, Take 1 tablet (8 mg total) by mouth every 8 (eight) hours as needed for nausea or vomiting (start 3 days; after chemo)., Disp: 40 tablet, Rfl: 1 .  Oxycodone HCl 10 MG TABS, Take 1 tablet (10 mg total) by mouth every 6 (six) hours., Disp: 25 tablet, Rfl: 0 .  prochlorperazine (COMPAZINE) 10 MG tablet, Take 1 tablet (10 mg total) by mouth every 6 (six) hours as needed for nausea or vomiting., Disp: 25 tablet, Rfl: 1 .  SPIRIVA RESPIMAT 1.25 MCG/ACT AERS, Inhale 1 puff into the lungs daily. , Disp: , Rfl:  .  SYMBICORT 80-4.5 MCG/ACT inhaler, Inhale 1 puff into the lungs every 12 (twelve) hours. , Disp: , Rfl:  .  zolpidem (AMBIEN) 5 MG tablet, Take 1 tablet (5 mg total) by mouth at bedtime as needed. for sleep, Disp: 30 tablet, Rfl: 2 .  lidocaine-prilocaine (EMLA) cream, Apply 1 application topically as needed. (Patient not taking: Reported on 06/24/2016), Disp: 30 g, Rfl: 2  Review of Systems  Constitutional: Positive for appetite change and fatigue.  Respiratory: Negative.   Cardiovascular:  Negative.   Neurological: Positive for weakness and headaches. Negative for facial asymmetry (improved) and numbness.  Psychiatric/Behavioral: Positive for dysphoric mood. The patient is nervous/anxious.     Social History  Substance Use Topics  . Smoking status: Former Smoker    Packs/day: 0.25    Years: 30.00    Types: Cigarettes    Quit date: 02/06/2005  . Smokeless tobacco: Never Used  . Alcohol use 3.0 oz/week    5 Cans of beer per week     Comment: 5 beers weekly   Objective:   BP 112/80 (BP Location: Right Arm, Patient Position: Sitting, Cuff Size: Normal)   Pulse 91   Temp 97.5 F (36.4 C) (Oral)   Resp 16   Wt 120 lb 6.4 oz (54.6 kg)   LMP 06/16/2000 (Approximate) Comment: 15 yrs ago  SpO2 95%   BMI 22.02 kg/m  Vitals:   06/25/16 1500  BP: 112/80  Pulse: 91  Resp: 16  Temp: 97.5 F (36.4 C)  TempSrc: Oral  SpO2: 95%  Weight: 120 lb 6.4 oz (54.6 kg)     Physical Exam  Constitutional: She appears well-developed and well-nourished. No distress.  Neck: Normal range of motion. Neck supple.  Cardiovascular: Normal rate, regular rhythm and normal heart sounds.  Exam reveals no gallop and no friction rub.   No murmur heard. Pulmonary/Chest: Effort normal and breath sounds normal. No respiratory distress. She has no wheezes. She has no rales.  Skin: She is not diaphoretic.  Psychiatric: She exhibits a depressed mood.  Vitals reviewed.    Assessment & Plan:     1. Transition of care performed with sharing of clinical summary Reviewed notes from hospitalization from 06/21/16-06/23/16, imaging records and previous oncology notes. She is to call if she has any side effects   2. Primary cancer of right lower lobe of lung [facility]) Has CT on Monday 06/28/16 for further evaluation. Patient reports that when they see Dr. Rogue Bussing on 07/01/16 they are going to inform him they do not wish to do more chemo. It was discussed for her to discuss with her husband and establish  advanced directives and living will. Her niece that accompanied her today is helping her get this in order already. She is to call if she needs anything or has any acute issues.   3. Brain metastasis ([place]) She is going to start back at the [facility] for gamma radiation as this was successful in the past.   4. Cerebral edema (H[facility]) Continue decadron 10mg  3x daily.        Mar Daring, PA-C  [facility]

## 2016-06-28 ENCOUNTER — Ambulatory Visit
Admission: RE | Admit: 2016-06-28 | Discharge: 2016-06-28 | Disposition: A | Discharge status: Home / Self Care | Payer: BLUE CROSS/BLUE SHIELD | Source: Ambulatory Visit | Attending: Internal Medicine | Admitting: Internal Medicine

## 2016-06-28 DIAGNOSIS — I2510 Atherosclerotic heart disease of native coronary artery without angina pectoris: Secondary | ICD-10-CM | POA: Insufficient documentation

## 2016-06-28 DIAGNOSIS — C7931 Secondary malignant neoplasm of brain: Secondary | ICD-10-CM | POA: Insufficient documentation

## 2016-06-28 DIAGNOSIS — R918 Other nonspecific abnormal finding of lung field: Secondary | ICD-10-CM | POA: Diagnosis not present

## 2016-06-28 DIAGNOSIS — I700 Atherosclerosis of aorta: Secondary | ICD-10-CM | POA: Diagnosis not present

## 2016-06-28 DIAGNOSIS — C3431 Malignant neoplasm of lower lobe, right bronchus or lung: Secondary | ICD-10-CM | POA: Diagnosis not present

## 2016-06-28 MED ORDER — IOPAMIDOL (ISOVUE-300) INJECTION 61%
75.0000 mL | Freq: Once | INTRAVENOUS | Status: AC | PRN
  Administered 2016-06-28: 75 mL via INTRAVENOUS

## 2016-06-30 ENCOUNTER — Inpatient Hospital Stay: Payer: BLUE CROSS/BLUE SHIELD

## 2016-06-30 ENCOUNTER — Telehealth: Payer: Self-pay | Admitting: *Deleted

## 2016-06-30 ENCOUNTER — Inpatient Hospital Stay (HOSPITAL_BASED_OUTPATIENT_CLINIC_OR_DEPARTMENT_OTHER): Payer: BLUE CROSS/BLUE SHIELD | Admitting: Internal Medicine

## 2016-06-30 VITALS — BP 118/81 | HR 98 | Temp 96.7°F | Resp 16 | Wt 117.2 lb

## 2016-06-30 DIAGNOSIS — Z79899 Other long term (current) drug therapy: Secondary | ICD-10-CM | POA: Diagnosis not present

## 2016-06-30 DIAGNOSIS — G936 Cerebral edema: Secondary | ICD-10-CM | POA: Diagnosis not present

## 2016-06-30 DIAGNOSIS — R112 Nausea with vomiting, unspecified: Secondary | ICD-10-CM

## 2016-06-30 DIAGNOSIS — C3431 Malignant neoplasm of lower lobe, right bronchus or lung: Secondary | ICD-10-CM

## 2016-06-30 DIAGNOSIS — R531 Weakness: Secondary | ICD-10-CM | POA: Diagnosis not present

## 2016-06-30 DIAGNOSIS — Z5111 Encounter for antineoplastic chemotherapy: Secondary | ICD-10-CM | POA: Diagnosis not present

## 2016-06-30 DIAGNOSIS — Z87891 Personal history of nicotine dependence: Secondary | ICD-10-CM

## 2016-06-30 DIAGNOSIS — F329 Major depressive disorder, single episode, unspecified: Secondary | ICD-10-CM

## 2016-06-30 DIAGNOSIS — Z8701 Personal history of pneumonia (recurrent): Secondary | ICD-10-CM | POA: Diagnosis not present

## 2016-06-30 DIAGNOSIS — D61810 Antineoplastic chemotherapy induced pancytopenia: Secondary | ICD-10-CM

## 2016-06-30 DIAGNOSIS — F418 Other specified anxiety disorders: Secondary | ICD-10-CM | POA: Diagnosis not present

## 2016-06-30 DIAGNOSIS — D696 Thrombocytopenia, unspecified: Secondary | ICD-10-CM | POA: Diagnosis not present

## 2016-06-30 DIAGNOSIS — M791 Myalgia: Secondary | ICD-10-CM | POA: Diagnosis not present

## 2016-06-30 DIAGNOSIS — R29810 Facial weakness: Secondary | ICD-10-CM | POA: Diagnosis not present

## 2016-06-30 DIAGNOSIS — R5383 Other fatigue: Secondary | ICD-10-CM

## 2016-06-30 DIAGNOSIS — C7931 Secondary malignant neoplasm of brain: Secondary | ICD-10-CM | POA: Diagnosis not present

## 2016-06-30 LAB — CBC WITH DIFFERENTIAL/PLATELET
BASOS ABS: 0 10*3/uL (ref 0–0.1)
Basophils Relative: 0 %
Eosinophils Absolute: 0 10*3/uL (ref 0–0.7)
Eosinophils Relative: 1 %
HEMATOCRIT: 26.2 % — AB (ref 36.0–46.0)
HEMOGLOBIN: 8.5 g/dL — AB (ref 12.0–15.0)
LYMPHS ABS: 1.2 10*3/uL (ref 1.0–3.6)
Lymphocytes Relative: 36 %
MCH: 38.9 pg — ABNORMAL HIGH (ref 26.0–34.0)
MCHC: 32.4 g/dL (ref 30.0–36.0)
MCV: 104.4 fL — ABNORMAL HIGH (ref 80.0–100.0)
Monocytes Absolute: 0.5 10*3/uL (ref 0.2–0.9)
Monocytes Relative: 16 %
NEUTROS ABS: 1.5 10*3/uL (ref 1.4–6.5)
Neutrophils Relative %: 47 %
Platelets: 55 10*3/uL — ABNORMAL LOW (ref 150–440)
RBC: 2.18 MIL/uL — AB (ref 3.80–5.20)
RDW: 17.1 % — ABNORMAL HIGH (ref 11.5–14.5)
WBC: 3.3 10*3/uL — AB (ref 3.6–11.0)

## 2016-06-30 LAB — COMPREHENSIVE METABOLIC PANEL
ALBUMIN: 4.2 g/dL (ref 3.5–5.0)
ALK PHOS: 53 U/L (ref 38–126)
ALT: 19 U/L (ref 14–54)
AST: 24 U/L (ref 15–41)
Anion gap: 10 (ref 5–15)
BILIRUBIN TOTAL: 0.9 mg/dL (ref 0.3–1.2)
BUN: 14 mg/dL (ref 6–20)
CALCIUM: 9.7 mg/dL (ref 8.9–10.3)
CO2: 26 mmol/L (ref 22–32)
CREATININE: 0.91 mg/dL (ref 0.44–1.00)
Chloride: 97 mmol/L — ABNORMAL LOW (ref 101–111)
GFR calc Af Amer: >60 mL/min (ref >60)
GFR calc non Af Amer: >60 mL/min (ref >60)
GLUCOSE: 134 mg/dL — AB (ref 65–99)
Potassium: 3 mmol/L — ABNORMAL LOW (ref 3.5–5.1)
Sodium: 133 mmol/L — ABNORMAL LOW (ref 135–145)
TOTAL PROTEIN: 7.3 g/dL (ref 6.5–8.1)

## 2016-06-30 MED ORDER — HEPARIN SOD (PORK) LOCK FLUSH 100 UNIT/ML IV SOLN: 500.0000 [IU] | Freq: Once | INTRAVENOUS | Status: DC

## 2016-06-30 MED ORDER — DEXAMETHASONE 4 MG PO TABS: 4.0000 mg | ORAL_TABLET | Freq: Three times a day (TID) | ORAL | 0 refills | Status: DC

## 2016-06-30 MED ORDER — SODIUM CHLORIDE 0.9% FLUSH
10.0000 mL | INTRAVENOUS | Status: DC | PRN
  Administered 2016-06-30: 10 mL via INTRAVENOUS
  Filled 2016-06-30: qty 10

## 2016-06-30 NOTE — Assessment & Plan Note (Addendum)
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 June 24th CT chest-STABLE disease. Tolerating chemotherapy with mild-to-moderate difficulties [see discussion below]. Reviewed the findings with the patient and family. Patient wants to hold off for the chemotherapy- given the side effects [extreme fatigue/myalgias]; and also given the progression of the brain. Discussed regarding repeating a biopsy of the lung lesions- which could be facilitated at [facility]- if patient goes to [facility] for radiation [facility] discussion below]  # Symptomatic brain metastases- worsening right frontal ~2 cm/vasogenic edema; subcentimeter cerebellar metastases/others. Recommended restarting steroids 4 mg dexamethasone 3 times a day. [prior GK x2 lesions in [facility]- Nov 2017]. Patient declined whole brain radiation at our center. Patient family keen on going back to [facility] for radiation treatments. Long discussion with patient regarding the time sensitive nature of the brain metastases. Awaiting transfer records to [facility]. Angie Fava, Nurse navigator, to follow-up regarding establishing care at the clinic ASAP.   # pancytopenia- white count 3.3 hemoglobin 8.5 platelets 55- from chemotherapy approximately 2 weeks ago. Patient reluctant for the chemotherapy # cycle 4 due in appx 1 week. However, will plan to hold it any ways given the need for brain RT.   # RTC - to be decided based on above plan.   # I reviewed the blood work- with the patient in detail; also reviewed the imaging independently [as summarized above]; and with the patient in detail.   # 40 minutes face-to-face with the patient discussing the above plan of care; more than 50% of time spent on prognosis/ natural history; counseling and coordination.

## 2016-06-30 NOTE — Progress Notes (Signed)
[place] OFFICE PROGRESS NOTE  Patient Care Team: Mar Daring, PA-C as PCP - General (Family Medicine) Allyne Gee, MD as Referring Physician (Internal Medicine)  Malignant neoplasm of trachea, bronchus, and lung [facility])   Staging form: Lung, AJCC 7th Edition     Clinical: Stage IIIB (T4, N2, M0) - Signed by Cammie Sickle, MD on 06/24/2015    Oncology History   # FEB 2017-[place] CA Right Lower Lung T4N2M0- STAGE IIIB [Bronch & Subcarinal LNBx]- March 8th START CARBO-ALIMTA x4 cycles- June 8th 2017 - IMPROVED FDG MULTI-FOCAL lesions/N2-LN activity. S/p [place]-- Alimta x6  # AUG 18th- [place]- Avastin maintenance; SEP 13th DISCONTINUE AVASTIN [sec to cavitary lesion]; OCT 20th 2017- CT-STABLE Disease; NOV 8th CT/PET [facility] clinic]- "overall slight progression-? Lymphangiectatic spread"  # NOV 2nd 2017-MRI, [facility]- Brain mets [asymptomatic;Right frontal ~48m; ~350mlesions -appx 3-4 lesions; (right Frontal & Left parietal s/p GK x 2 lesions]   # [facility] [Dr.Leventakos; [phone]/OVFI] MOLECULAR STUDIES:  KRAS MUTATED/Tissues not sufficient for OTHER the molecular marker; will need repeat Bx ; GUARDIANT TESTING [[facility]]- pending.      [place] cancer of right [place] lobe of [place]    INTERVAL HISTORY:  [name]89.o.  female pleasant patient above history of Recurrent Metastatic adenocarcinoma the lung- currently on carbo-taxol cycle # 3 cyclesIs here to review the results of her restaging CT of the chest.   In the interim patient was diagnosed with Left facial droop/upper extremity hemiparesis- brain MRI showed increasing right frontal lesion approximately 2-3 cm in size. She also noted to have a major cerebellar lesions. Patient was evaluated by radiation oncology- recommended radiation. However she declined having radiation locally. [facility]   Patient's Facial droop/ left upper  extremity weakness improved on steroids while in hospital. She is currently off also for the last 2 days. She denies any worsening headaches or neurologic deficit this time.  Overall she feels poorly because of "side effects from chemotherapy"- includes nausea/extreme fatigue/ extremity myalgias. Onpro was taken off.   Denies any unusual shortness of breath or cough. No diarrhea no constipation. No hemoptysis.   REVIEW OF SYSTEMS:  A complete 10 point review of system is done which is negative except mentioned above/history of present illness.   PAST MEDICAL HISTORY :  Past Medical History:  Diagnosis Date  . Anxiety   . [place] of right [place] ([place]  . Depression   . Depression with anxiety    Well controlled with Wellbutrin and Buspar  . Pneumonia     PAST SURGICAL HISTORY :   Past Surgical History:  Procedure Laterality Date  . ECTOPIC PREGNANCY SURGERY  1988  . ELECTROMAGNETIC NAVIGATION BROCHOSCOPY N/A 02/10/2015   Procedure: ELECTROMAGNETIC NAVIGATION BRONCHOSCOPY;  Surgeon: KuFlora LippsMD;  Location: [facility] ORS;  Service: Cardiopulmonary;  Laterality: N/A;  . ENDOBRONCHIAL ULTRASOUND N/A 02/10/2015   Procedure: ENDOBRONCHIAL ULTRASOUND;  Surgeon: KuFlora LippsMD;  Location: [facility] ORS;  Service: Cardiopulmonary;  Laterality: N/A;  . PERIPHERAL VASCULAR CATHETERIZATION N/A 03/05/2015   Procedure: PoGlori Luisath Insertion;  Surgeon: GrKatha CabalMD;  [facility];  Service: Cardiovascular;  Laterality: N/A;    FAMILY HISTORY :   Family History  Problem Relation Age of Onset  . Cancer Father        Prostate Cancer  . Hypertension Father   . Stroke Father   .  Hypertension Brother     SOCIAL HISTORY:   Social History  Substance Use Topics  . Smoking status: Former Smoker    Packs/day: 0.25    Years: 30.00    Types: Cigarettes    Quit date: 02/06/2005  . Smokeless tobacco: Never Used  . Alcohol use 3.0 oz/week    5 Cans of beer per week      Comment: 5 beers weekly    ALLERGIES:  has No Known Allergies.  MEDICATIONS:  Current Outpatient Prescriptions  Medication Sig Dispense Refill  . acetaminophen (TYLENOL) 500 MG tablet Take 1,000 mg by mouth every 4 (four) hours as needed.     . busPIRone (BUSPAR) 5 MG tablet Take 1 tablet (5 mg total) by mouth 2 (two) times daily. 60 tablet 6  . cyclobenzaprine (FLEXERIL) 5 MG tablet Take 5 mg by mouth 3 (three) times daily as needed for muscle spasms.    [name] Kitchen dexamethasone (DECADRON) 4 MG tablet Take 1 tablet (4 mg total) by mouth 3 (three) times daily. 60 tablet 0  . docusate sodium (COLACE) 100 MG capsule Take 100 mg by mouth 2 (two) times daily.    [name] Kitchen ibuprofen (ADVIL,MOTRIN) 800 MG tablet Take 800 mg by mouth every 8 (eight) hours as needed for mild pain or moderate pain.    [name] Kitchen lidocaine-prilocaine (EMLA) cream Apply 1 application topically as needed. 30 g 2  . ondansetron (ZOFRAN) 8 MG tablet Take 1 tablet (8 mg total) by mouth every 8 (eight) hours as needed for nausea or vomiting (start 3 days; after chemo). 40 tablet 1  . Oxycodone HCl 10 MG TABS Take 1 tablet (10 mg total) by mouth every 6 (six) hours. 25 tablet 0  . prochlorperazine (COMPAZINE) 10 MG tablet Take 1 tablet (10 mg total) by mouth every 6 (six) hours as needed for nausea or vomiting. 25 tablet 1  . SPIRIVA RESPIMAT 1.25 MCG/ACT AERS Inhale 1 puff into the lungs daily.     . SYMBICORT 80-4.5 MCG/ACT inhaler Inhale 1 puff into the lungs every 12 (twelve) hours.     [name] Kitchen zolpidem (AMBIEN) 5 MG tablet Take 1 tablet (5 mg total) by mouth at bedtime as needed. for sleep 30 tablet 2   No current facility-administered medications for this visit.     PHYSICAL EXAMINATION: ECOG PERFORMANCE STATUS: 0 - Asymptomatic  BP 118/81 (BP Location: Right Arm, Patient Position: Sitting)   Pulse 98   Temp (!) 96.7 F (35.9 C) (Tympanic)   Resp 16   Wt 117 lb 4 oz (53.2 kg)   LMP 06/16/2000 (Approximate) Comment: 15 yrs ago  BMI 21.45  kg/m   Filed Weights   06/30/16 0938  Weight: 117 lb 4 oz (53.2 kg)    GENERAL: Well-nourished well-developed; Alert, no distress and comfortable.   Accompanied by her husband; care today. EYES: no pallor or icterus OROPHARYNX: no thrush or ulceration; good dentition  NECK: supple, no masses felt LYMPH:  no palpable lymphadenopathy in the cervical, axillary or inguinal regions LUNGS: clear to auscultation and  No wheeze or crackles HEART/CVS: regular rate & rhythm and no murmurs; No lower extremity edema ABDOMEN:abdomen soft, non-tender and normal bowel sounds Musculoskeletal:no cyanosis of digits and no clubbing  PSYCH: alert & oriented x 3 with fluent speech; emotional tearful. NEURO: no focal motor/sensory deficits SKIN:  no rashes or significant lesions  LABORATORY DATA:  I have reviewed the data as listed    Component Value Date/Time   NA 133 (  L) 06/30/2016 0912   NA 139 01/03/2015 0910   K 3.0 (L) 06/30/2016 0912   CL 97 (L) 06/30/2016 0912   CO2 26 06/30/2016 0912   GLUCOSE 134 (H) 06/30/2016 0912   BUN 14 06/30/2016 0912   BUN 10 01/03/2015 0910   CREATININE 0.91 06/30/2016 0912   CALCIUM 9.7 06/30/2016 0912   PROT 7.3 06/30/2016 0912   PROT 6.5 01/03/2015 0910   ALBUMIN 4.2 06/30/2016 0912   ALBUMIN 3.8 01/03/2015 0910   AST 24 06/30/2016 0912   ALT 19 06/30/2016 0912   ALKPHOS 53 06/30/2016 0912   BILITOT 0.9 06/30/2016 0912   BILITOT <0.2 01/03/2015 0910   GFRNONAA >60 06/30/2016 0912   GFRAA >60 06/30/2016 0912    No results found for: SPEP, UPEP  Lab Results  Component Value Date   WBC 3.3 (L) 06/30/2016   NEUTROABS 1.5 06/30/2016   HGB 8.5 (L) 06/30/2016   HCT 26.2 (L) 06/30/2016   MCV 104.4 (H) 06/30/2016   PLT 55 (L) 06/30/2016      Chemistry      Component Value Date/Time   NA 133 (L) 06/30/2016 0912   NA 139 01/03/2015 0910   K 3.0 (L) 06/30/2016 0912   CL 97 (L) 06/30/2016 0912   CO2 26 06/30/2016 0912   BUN 14 06/30/2016 0912    BUN 10 01/03/2015 0910   CREATININE 0.91 06/30/2016 0912      Component Value Date/Time   CALCIUM 9.7 06/30/2016 0912   ALKPHOS 53 06/30/2016 0912   AST 24 06/30/2016 0912   ALT 19 06/30/2016 0912   BILITOT 0.9 06/30/2016 0912   BILITOT <0.2 01/03/2015 0910       RADIOGRAPHIC STUDIES: I have personally reviewed the radiological images as listed and agreed with the findings in the report. No results found.   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 June 24th CT chest-STABLE disease. Tolerating chemotherapy with mild-to-moderate difficulties [see discussion below]. Reviewed the findings with the patient and family. Patient wants to hold off for the chemotherapy- given the side effects [extreme fatigue/myalgias]; and also given the progression of the brain. Discussed regarding repeating a biopsy of the lung lesions- which could be facilitated at [facility]- if patient goes to [facility] for radiation [facility] discussion below]  # Symptomatic brain metastases- worsening right frontal ~2 cm/vasogenic edema; subcentimeter cerebellar metastases/others. Recommended restarting steroids 4 mg dexamethasone 3 times a day. [prior GK x2 lesions in [place] clinic- Nov 2017]. Patient declined whole brain radiation at our center. Patient family keen on going back to [facility] for radiation treatments. Long discussion with patient regarding the time sensitive nature of the brain metastases. Awaiting transfer records to [facility]. Angie Fava, Nurse navigator, to follow-up regarding establishing care at the clinic ASAP.   # pancytopenia- white count 3.3 hemoglobin 8.5 platelets 55- from chemotherapy approximately 2 weeks ago. Patient reluctant for the chemotherapy # cycle 4 due in appx 1 week. However, will plan to hold it any ways given the need for brain RT.   # RTC - to be decided based on above plan.   # I  reviewed the blood work- with the patient in detail; also reviewed the imaging independently [as summarized above]; and with the patient in detail.   # 40 minutes face-to-face with the patient discussing the above plan of care; more than 50% of time  spent on prognosis/ natural history; counseling and coordination.  No orders of the defined types were placed in this encounter.    Cammie Sickle, MD 07/02/2016 9:55 AM

## 2016-06-30 NOTE — Telephone Encounter (Signed)
Left message with Annamary Carolin, RN case manager to call back regarding coordinating transfer of care for patient to continue treatment at [facility] clinic. Awaiting callback.

## 2016-06-30 NOTE — Progress Notes (Signed)
Patient here today for follow up.  Patient states that she does not want to continue chemo anymore.

## 2016-07-01 ENCOUNTER — Telehealth: Payer: Self-pay | Admitting: *Deleted

## 2016-07-01 NOTE — Telephone Encounter (Signed)
Spoke with Jaclyn Shaggy at [facility] who will schedule appt with Dr. David Stall once all records received and reviewed. Pt will be notified by [facility] with appt information. Pt will be made aware to expect call from [facility] within the next week. All records to be faxed today 07/01/2016 to [phone] Attn: Medical-Oncology, Dr. David Stall. Disc of all images to be sent over by fedex overnight to address:  [facility] Attn: Medical-Oncology Dr. David Stall [address] [place] [address]  RN case manager, [place], will be notified of the info above to coordinate travel arrangements for patient.

## 2016-07-01 NOTE — Telephone Encounter (Signed)
Pt's spouse called and stated that pt just received a phone call from [facility] Dr. David Stall would like to discuss with Dr. Rogue Bussing the pt's ability to travel. Dr. David Stall would like a call back today or tomorrow in order to expedite scheduling appt. Please call at [phone].

## 2016-07-01 NOTE — Telephone Encounter (Addendum)
All records have been faxed and ready for fedex pick up. Fedex tracking number is [id number]. Pt aware. Message left with [facility] RN case manager for the [employer] of business to contact patient to coordinate travel arrangements.

## 2016-07-02 ENCOUNTER — Telehealth: Payer: Self-pay | Admitting: Internal Medicine

## 2016-07-02 NOTE — Telephone Encounter (Signed)
I reached out to [facility]; who returned my phone call. He would call me back after reviewing the patient's records.   I Left message for pt to check re: status of her appt/travel to [facility] clinic. Dr.B

## 2016-07-08 ENCOUNTER — Other Ambulatory Visit: Payer: Self-pay | Admitting: *Deleted

## 2016-07-08 MED ORDER — OXYCODONE HCL 10 MG PO TABS: 10.0000 mg | ORAL_TABLET | Freq: Four times a day (QID) | ORAL | 0 refills | Status: DC

## 2016-07-08 NOTE — Telephone Encounter (Signed)
Per Dr B, find out where things stand with appt for [facility].  Per patient, because of the fourth, she had been told she would get a call today. She will call us with update as soon as she hears from them

## 2016-07-12 ENCOUNTER — Telehealth: Payer: Self-pay | Admitting: *Deleted

## 2016-07-12 NOTE — Telephone Encounter (Signed)
Patient called to report that she has an appt at [facility] on 7/12

## 2016-07-15 ENCOUNTER — Telehealth: Payer: Self-pay | Admitting: Internal Medicine

## 2016-07-15 ENCOUNTER — Other Ambulatory Visit: Payer: Self-pay | Admitting: Internal Medicine

## 2016-07-15 DIAGNOSIS — C3431 Malignant neoplasm of lower lobe, right bronchus or lung: Secondary | ICD-10-CM

## 2016-07-15 NOTE — Progress Notes (Signed)
ON PATHWAY REGIMEN - Non-Small Cell Lung  No Change  Continue With Treatment as Ordered.     A cycle is every 21 days:     Paclitaxel      Carboplatin   **Always confirm dose/schedule in your pharmacy ordering system**    Patient Characteristics: Stage IV Metastatic, Non Squamous, Initial Chemotherapy/Immunotherapy, PS = 0, 1, PD-L1 Expression Positive 1-49% (TPS) / Negative / Not Tested / Not a Candidate for Immunotherapy AJCC T Category: T4 Current Disease Status: Distant Metastases AJCC N Category: N2 AJCC M Category: M1a AJCC 8 Stage Grouping: IVA Histology: Non Squamous Cell ROS1 Rearrangement Status: Quantity Not Sufficient T790M Mutation Status: Not Applicable - EGFR Mutation Negative/Unknown Other Mutations/Biomarkers: No Other Actionable Mutations PD-L1 Expression Status: Quantity Not Sufficient Chemotherapy/Immunotherapy LOT: Initial Chemotherapy/Immunotherapy Molecular Targeted Therapy: Not Appropriate ALK Translocation Status: Quantity Not Sufficient Would you be surprised if this patient died  in the next year? I would be surprised if this patient died in the next year EGFR Mutation Status: Quantity Not Sufficient BRAF V600E Mutation Status: Quantity Not Sufficient Performance Status: PS = 0, 1 Intent of Therapy: Non-Curative / Palliative Intent, Discussed with Patient

## 2016-07-15 NOTE — Telephone Encounter (Signed)
Discussed with Dr.Levantakos- recommends adding of avastin with carbo-taxol with next cycle. Pt currently at [facility]   Collette- please check when she will be back/ will plan to start carbo-Taxol-avastin/see me on that day [likley next week].    Heather please add cbc/cmp/ua/chemo. Thx

## 2016-07-16 NOTE — Addendum Note (Signed)
Addended by: Sabino Gasser on: 07/16/2016 09:37 AM   Modules accepted: Orders

## 2016-07-16 NOTE — Telephone Encounter (Signed)
avastin  And labs have been added to patient's schedule per md order

## 2016-07-22 ENCOUNTER — Ambulatory Visit: Payer: BLUE CROSS/BLUE SHIELD | Admitting: Internal Medicine

## 2016-07-22 ENCOUNTER — Ambulatory Visit: Payer: BLUE CROSS/BLUE SHIELD

## 2016-07-22 ENCOUNTER — Other Ambulatory Visit: Payer: BLUE CROSS/BLUE SHIELD

## 2016-07-28 ENCOUNTER — Inpatient Hospital Stay: Payer: BLUE CROSS/BLUE SHIELD | Attending: Internal Medicine

## 2016-07-28 ENCOUNTER — Inpatient Hospital Stay (HOSPITAL_BASED_OUTPATIENT_CLINIC_OR_DEPARTMENT_OTHER): Payer: BLUE CROSS/BLUE SHIELD | Admitting: Internal Medicine

## 2016-07-28 ENCOUNTER — Inpatient Hospital Stay: Payer: BLUE CROSS/BLUE SHIELD

## 2016-07-28 ENCOUNTER — Other Ambulatory Visit: Payer: Self-pay | Admitting: *Deleted

## 2016-07-28 VITALS — BP 108/71 | HR 92 | Temp 98.4°F | Resp 16 | Wt 128.0 lb

## 2016-07-28 DIAGNOSIS — C7931 Secondary malignant neoplasm of brain: Secondary | ICD-10-CM

## 2016-07-28 DIAGNOSIS — F329 Major depressive disorder, single episode, unspecified: Secondary | ICD-10-CM | POA: Diagnosis not present

## 2016-07-28 DIAGNOSIS — M791 Myalgia: Secondary | ICD-10-CM | POA: Diagnosis not present

## 2016-07-28 DIAGNOSIS — Z8701 Personal history of pneumonia (recurrent): Secondary | ICD-10-CM

## 2016-07-28 DIAGNOSIS — F419 Anxiety disorder, unspecified: Secondary | ICD-10-CM

## 2016-07-28 DIAGNOSIS — Z8042 Family history of malignant neoplasm of prostate: Secondary | ICD-10-CM | POA: Diagnosis not present

## 2016-07-28 DIAGNOSIS — D649 Anemia, unspecified: Secondary | ICD-10-CM

## 2016-07-28 DIAGNOSIS — C3431 Malignant neoplasm of lower lobe, right bronchus or lung: Secondary | ICD-10-CM | POA: Diagnosis not present

## 2016-07-28 DIAGNOSIS — G720 Drug-induced myopathy: Secondary | ICD-10-CM

## 2016-07-28 DIAGNOSIS — Z87891 Personal history of nicotine dependence: Secondary | ICD-10-CM | POA: Diagnosis not present

## 2016-07-28 DIAGNOSIS — Z5112 Encounter for antineoplastic immunotherapy: Secondary | ICD-10-CM | POA: Insufficient documentation

## 2016-07-28 DIAGNOSIS — R531 Weakness: Secondary | ICD-10-CM | POA: Insufficient documentation

## 2016-07-28 DIAGNOSIS — T380X5A Adverse effect of glucocorticoids and synthetic analogues, initial encounter: Secondary | ICD-10-CM | POA: Diagnosis not present

## 2016-07-28 DIAGNOSIS — Z5111 Encounter for antineoplastic chemotherapy: Secondary | ICD-10-CM | POA: Insufficient documentation

## 2016-07-28 LAB — URINALYSIS, COMPLETE (UACMP) WITH MICROSCOPIC
Bacteria, UA: NONE SEEN
Bilirubin Urine: NEGATIVE
GLUCOSE, UA: NEGATIVE mg/dL
HGB URINE DIPSTICK: NEGATIVE
Ketones, ur: NEGATIVE mg/dL
Leukocytes, UA: NEGATIVE
NITRITE: NEGATIVE
PH: 6 (ref 5.0–8.0)
Protein, ur: NEGATIVE mg/dL
Specific Gravity, Urine: 1.018 (ref 1.005–1.030)

## 2016-07-28 LAB — CBC WITH DIFFERENTIAL/PLATELET
Basophils Absolute: 0 10*3/uL (ref 0–0.1)
Basophils Relative: 0 %
Eosinophils Absolute: 0 10*3/uL (ref 0–0.7)
Eosinophils Relative: 1 %
HCT: 21.3 % — ABNORMAL LOW (ref 35.0–47.0)
Hemoglobin: 7.6 g/dL — ABNORMAL LOW (ref 12.0–16.0)
Lymphocytes Relative: 20 %
Lymphs Abs: 0.7 10*3/uL — ABNORMAL LOW (ref 1.0–3.6)
MCH: 41.7 pg — ABNORMAL HIGH (ref 26.0–34.0)
MCHC: 35.7 g/dL (ref 32.0–36.0)
MCV: 116.7 fL — ABNORMAL HIGH (ref 80.0–100.0)
Monocytes Absolute: 0.3 10*3/uL (ref 0.2–0.9)
Monocytes Relative: 8 %
Neutro Abs: 2.4 10*3/uL (ref 1.4–6.5)
Neutrophils Relative %: 71 %
Platelets: 157 10*3/uL (ref 150–440)
RBC: 1.82 MIL/uL — ABNORMAL LOW (ref 3.80–5.20)
RDW: 21.7 % — ABNORMAL HIGH (ref 11.5–14.5)
WBC: 3.4 10*3/uL — ABNORMAL LOW (ref 3.6–11.0)

## 2016-07-28 LAB — COMPREHENSIVE METABOLIC PANEL
ALT: 20 U/L (ref 14–54)
AST: 26 U/L (ref 15–41)
Albumin: 3.3 g/dL — ABNORMAL LOW (ref 3.5–5.0)
Alkaline Phosphatase: 56 U/L (ref 38–126)
Anion gap: 5 (ref 5–15)
BUN: 14 mg/dL (ref 6–20)
CO2: 25 mmol/L (ref 22–32)
Calcium: 8.4 mg/dL — ABNORMAL LOW (ref 8.9–10.3)
Chloride: 106 mmol/L (ref 101–111)
Creatinine, Ser: 0.58 mg/dL (ref 0.44–1.00)
GFR calc Af Amer: >60 mL/min (ref >60)
GFR calc non Af Amer: >60 mL/min (ref >60)
Glucose, Bld: 153 mg/dL — ABNORMAL HIGH (ref 65–99)
Potassium: 3.4 mmol/L — ABNORMAL LOW (ref 3.5–5.1)
Sodium: 136 mmol/L (ref 135–145)
Total Bilirubin: 0.6 mg/dL (ref 0.3–1.2)
Total Protein: 5.9 g/dL — ABNORMAL LOW (ref 6.5–8.1)

## 2016-07-28 LAB — ABO/RH: ABO/RH(D): O POS

## 2016-07-28 LAB — PREPARE RBC (CROSSMATCH)

## 2016-07-28 MED ORDER — SODIUM CHLORIDE 0.9 % IV SOLN
Freq: Once | INTRAVENOUS | Status: DC
Start: 2016-07-28 — End: 2016-07-28
  Filled 2016-07-28: qty 1000

## 2016-07-28 MED ORDER — OXYCODONE HCL 5 MG PO TABS: 5.0000 mg | ORAL_TABLET | Freq: Four times a day (QID) | ORAL | 0 refills | Status: DC | PRN

## 2016-07-28 MED ORDER — PALONOSETRON HCL INJECTION 0.25 MG/5ML
0.2500 mg | Freq: Once | INTRAVENOUS | Status: AC
  Administered 2016-07-28: 0.25 mg via INTRAVENOUS
  Filled 2016-07-28: qty 5

## 2016-07-28 MED ORDER — BEVACIZUMAB CHEMO INJECTION 400 MG/16ML: 15.0000 mg/kg | Freq: Once | INTRAVENOUS | Status: DC

## 2016-07-28 MED ORDER — HEPARIN SOD (PORK) LOCK FLUSH 100 UNIT/ML IV SOLN
500.0000 [IU] | Freq: Once | INTRAVENOUS | Status: AC
  Administered 2016-07-28: 500 [IU] via INTRAVENOUS
  Filled 2016-07-28: qty 5

## 2016-07-28 MED ORDER — SODIUM CHLORIDE 0.9% FLUSH
10.0000 mL | INTRAVENOUS | Status: DC | PRN
  Administered 2016-07-28: 10 mL via INTRAVENOUS
  Filled 2016-07-28: qty 10

## 2016-07-28 MED ORDER — FAMOTIDINE IN NACL 20-0.9 MG/50ML-% IV SOLN
20.0000 mg | Freq: Once | INTRAVENOUS | Status: AC
  Administered 2016-07-28: 20 mg via INTRAVENOUS
  Filled 2016-07-28: qty 50

## 2016-07-28 MED ORDER — SODIUM CHLORIDE 0.9 % IV SOLN
470.0000 mg | Freq: Once | INTRAVENOUS | Status: AC
  Administered 2016-07-28: 470 mg via INTRAVENOUS
  Filled 2016-07-28: qty 47

## 2016-07-28 MED ORDER — DIPHENHYDRAMINE HCL 50 MG/ML IJ SOLN
50.0000 mg | Freq: Once | INTRAMUSCULAR | Status: AC
  Administered 2016-07-28: 50 mg via INTRAVENOUS
  Filled 2016-07-28: qty 1

## 2016-07-28 MED ORDER — SODIUM CHLORIDE 0.9 % IV SOLN
Freq: Once | INTRAVENOUS | Status: AC
  Administered 2016-07-28: 11:00:00 via INTRAVENOUS
  Filled 2016-07-28: qty 5

## 2016-07-28 MED ORDER — SODIUM CHLORIDE 0.9 % IV SOLN
324.0000 mg | Freq: Once | INTRAVENOUS | Status: AC
  Administered 2016-07-28: 324 mg via INTRAVENOUS
  Filled 2016-07-28: qty 54

## 2016-07-28 MED ORDER — SODIUM CHLORIDE 0.9 % IV SOLN
Freq: Once | INTRAVENOUS | Status: AC
  Administered 2016-07-28: 10:00:00 via INTRAVENOUS
  Filled 2016-07-28: qty 1000

## 2016-07-28 MED ORDER — BEVACIZUMAB CHEMO INJECTION 400 MG/16ML
900.0000 mg | Freq: Once | INTRAVENOUS | Status: AC
  Administered 2016-07-28: 900 mg via INTRAVENOUS
  Filled 2016-07-28: qty 32

## 2016-07-28 MED ORDER — HEPARIN SOD (PORK) LOCK FLUSH 100 UNIT/ML IV SOLN: 500.0000 [IU] | Freq: Once | INTRAVENOUS | Status: DC | PRN

## 2016-07-28 MED ORDER — PACLITAXEL CHEMO INJECTION 300 MG/50ML
200.0000 mg/m2 | Freq: Once | INTRAVENOUS | Status: DC
  Filled 2016-07-28: qty 54

## 2016-07-28 MED ORDER — DEXAMETHASONE 4 MG PO TABS: 4.0000 mg | ORAL_TABLET | Freq: Every day | ORAL | 0 refills | Status: DC

## 2016-07-28 NOTE — Assessment & Plan Note (Addendum)
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 June 24th CT chest-STABLE disease. Tolerating chemotherapy with mild-to-moderate difficulties [see discussion below]; question worsening brain metastases versus radiation necrosis[ C discussion below]. Reviewed the notes from [facility] in detail. Also discussed with Dr. David Stall over the phone.   # Proceed with carbo Taxol; #4 with the addition of Avastin this cycle. Reviewed the rationale for using Avastin. Discussed the potential side effects including but not limited to elevated blood pressure ; nephrotic syndrome wound healing problems.  # Symptomatic brain metastases- worsening right frontal ~2 cm/vasogenic edema; subcentimeter cerebellar metastases/others- "progressive disease versus radiation necrosis". Recommend starting dexamethasone 4 mg once a day. Also hopefully Avastin will help her symptoms.  # Anemia- Hb 7.4; plan PRBC transfusion this week.   # Myalgias- secondary to Taxol. Recommend Claritin/oxycodone.   # Steroid Myopathy/ Left LE weakness- ?recm 4 mg/day; discussed physical therapy/exercise  # follow up in 10 days; hold tube; labs; 3 weeks/MD; carbo-avastin/labs.   # 40 minutes face-to-face with the patient discussing the above plan of care; more than 50% of time spent on prognosis/ natural history; counseling and coordination.

## 2016-07-28 NOTE — Progress Notes (Signed)
hgb =7.6 today  Ok with md proceed with treatment

## 2016-07-28 NOTE — Progress Notes (Signed)
Carbo AUC decreased from 6 to 5

## 2016-07-28 NOTE — Progress Notes (Signed)
Patient is here for a follow up today. Patient states that she has been experiencing muscle spasms and would like to discuss restarting Flexeril.

## 2016-07-28 NOTE — Progress Notes (Signed)
[place] OFFICE PROGRESS NOTE  Patient Care Team: Mar Daring, PA-C as PCP - General (Family Medicine) Allyne Gee, MD as Referring Physician (Internal Medicine)  Malignant neoplasm of trachea, bronchus, and lung [facility])   Staging form: Lung, AJCC 7th Edition     Clinical: Stage IIIB (T4, N2, M0) - Signed by Cammie Sickle, MD on 06/24/2015    Oncology History   # FEB 2017-[place] CA Right Lower Lung T4N2M0- STAGE IIIB [Bronch & Subcarinal LNBx]- March 8th START CARBO-ALIMTA x4 cycles- June 8th 2017 - IMPROVED FDG MULTI-FOCAL lesions/N2-LN activity. S/p [place]-- Alimta x6  # AUG 18th- Alimta- Avastin maintenance; SEP 13th DISCONTINUE AVASTIN [sec to cavitary lesion]; OCT 20th 2017- CT-STABLE Disease; NOV 8th CT/PET [facility] clinic]- "overall slight progression-? Lymphangiectatic spread"  # April 25th 2018- Carbo- Taxol x3 ccyles; June 2018- CT chest STABLE Disease;July 25th- Add Avastin to [place] taxol  # NOV 2nd 2017-MRI, [facility]- Brain mets [asymptomatic;Right frontal ~8m; ~332mlesions -appx 3-4 lesions; (right Frontal & Left parietal s/p GK x 2 lesions] ; June 2018- Progession vs radiation necrosis [July 2th-Add Avastin]   --------------------------------------------------------------     MOLECULAR STUDIES:  KRAS MUTATED/Tissues not sufficient for OTHER the molecular marker; will need repeat Bx ; GUARDIANT TESTING [[facility]]- pending. [phone]    [place] of [place]    INTERVAL HISTORY:  Deanna Kalish89.o.  female pleasant patient above history of Recurrent Metastatic adenocarcinoma the lung-Status post 3 cycles of carbotaxol-stable disease on the CT scan of the chest; and also noted to have "progressive brain metastases".   [facility] the [facility] progression of disease versus radiation necrosis.  Given patient's reluctance to proceed with radiation- recommend addition of Avastin to [place]  Patient finished her steroids approximately 4 days ago. She noted to have some weakness in the left lower extremity proximal. Denies any falls. Denies any headaches. Denies any unusual shortness of breath or cough. No diarrhea no constipation. No hemoptysis.   REVIEW OF SYSTEMS:  A complete 10 point review of system is done which is negative except mentioned above/history of present illness.   PAST MEDICAL HISTORY :  Past Medical History:  Diagnosis Date  . Anxiety   . Cancer of right lung ([place]  . Depression   . Depression with anxiety    Well controlled with Wellbutrin and Buspar  . Pneumonia     PAST SURGICAL HISTORY :   Past Surgical History:  Procedure Laterality Date  . ECTOPIC PREGNANCY SURGERY  1988  . ELECTROMAGNETIC NAVIGATION BROCHOSCOPY N/A 02/10/2015   Procedure: ELECTROMAGNETIC NAVIGATION BRONCHOSCOPY;  Surgeon: KuFlora LippsMD;  Location: [facility] ORS;  Service: Cardiopulmonary;  Laterality: N/A;  . ENDOBRONCHIAL ULTRASOUND N/A 02/10/2015   Procedure: ENDOBRONCHIAL ULTRASOUND;  Surgeon: KuFlora LippsMD;  Location: [facility] ORS;  Service: Cardiopulmonary;  Laterality: N/A;  . PERIPHERAL VASCULAR CATHETERIZATION N/A 03/05/2015   Procedure: PoGlori Luisath Insertion;  Surgeon: GrKatha CabalMD;  [place] [facility];  Service: Cardiovascular;  Laterality: N/A;    FAMILY HISTORY :   Family History  Problem Relation Age of Onset  . Cancer Father        Prostate Cancer  . Hypertension Father   . Stroke Father   . Hypertension Brother     SOCIAL HISTORY:   Social History  Substance  Use Topics  . Smoking status: Former Smoker    Packs/day: 0.25    Years: 30.00    Types: Cigarettes    Quit date: 02/06/2005  . Smokeless tobacco: Never Used  . Alcohol use 3.0 oz/week    5 Cans of beer per week     Comment: 5 beers weekly    ALLERGIES:  has No Known  Allergies.  MEDICATIONS:  Current Outpatient Prescriptions  Medication Sig Dispense Refill  . acetaminophen (TYLENOL) 500 MG tablet Take 1,000 mg by mouth every 4 (four) hours as needed.     . busPIRone (BUSPAR) 5 MG tablet Take 1 tablet (5 mg total) by mouth 2 (two) times daily. 60 tablet 6  . ibuprofen (ADVIL,MOTRIN) 800 MG tablet Take 800 mg by mouth every 8 (eight) hours as needed for mild pain or moderate pain.    [name] Kitchen lidocaine-prilocaine (EMLA) cream Apply 1 application topically as needed. 30 g 2  . ondansetron (ZOFRAN) 8 MG tablet Take 1 tablet (8 mg total) by mouth every 8 (eight) hours as needed for nausea or vomiting (start 3 days; after chemo). 40 tablet 1  . Oxycodone HCl 10 MG TABS Take 1 tablet (10 mg total) by mouth every 6 (six) hours. 30 tablet 0  . prochlorperazine (COMPAZINE) 10 MG tablet Take 1 tablet (10 mg total) by mouth every 6 (six) hours as needed for nausea or vomiting. 25 tablet 1  . SPIRIVA RESPIMAT 1.25 MCG/ACT AERS Inhale 1 puff into the lungs daily.     . SYMBICORT 80-4.5 MCG/ACT inhaler Inhale 1 puff into the lungs every 12 (twelve) hours.     [name] Kitchen zolpidem (AMBIEN) 5 MG tablet Take 1 tablet (5 mg total) by mouth at bedtime as needed. for sleep 30 tablet 2  . cyclobenzaprine (FLEXERIL) 5 MG tablet Take 5 mg by mouth 3 (three) times daily as needed for muscle spasms.    [name] Kitchen dexamethasone (DECADRON) 4 MG tablet Take 1 tablet (4 mg total) by mouth daily. Take with food 30 tablet 0  . docusate sodium (COLACE) 100 MG capsule Take 100 mg by mouth 2 (two) times daily.    [name] Kitchen oxyCODONE (OXY IR/ROXICODONE) 5 MG immediate release tablet Take 1 tablet (5 mg total) by mouth every 6 (six) hours as needed for severe pain. 40 tablet 0   No current facility-administered medications for this visit.    Facility-Administered Medications Ordered in Other Visits  Medication Dose Route Frequency Provider Last Rate Last Dose  . 0.9 %  sodium chloride infusion   Intravenous Once Charlaine Dalton R, MD      . heparin lock flush 100 unit/mL  500 Units Intracatheter Once PRN Charlaine Dalton R, MD      . sodium chloride flush (NS) 0.9 % injection 10 mL  10 mL Intravenous PRN Cammie Sickle, MD   10 mL at 07/28/16 0814    PHYSICAL EXAMINATION: ECOG PERFORMANCE STATUS: 0 - Asymptomatic  BP 108/71 (BP Location: Left Arm)   Pulse 92   Temp 98.4 F (36.9 C) (Tympanic)   Resp 16   Wt 128 lb (58.1 kg)   LMP 06/16/2000 (Approximate) Comment: 15 yrs ago  BMI 23.41 kg/m   Filed Weights   07/28/16 0849  Weight: 128 lb (58.1 kg)    GENERAL: Well-nourished well-developed; Alert, no distress and comfortable.   Accompanied by her husband.  EYES: no pallor or icterus OROPHARYNX: no thrush or ulceration; good dentition  NECK: supple, no masses felt  LYMPH:  no palpable lymphadenopathy in the cervical, axillary or inguinal regions LUNGS: clear to auscultation and  No wheeze or crackles HEART/CVS: regular rate & rhythm and no murmurs; No lower extremity edema ABDOMEN:abdomen soft, non-tender and normal bowel sounds Musculoskeletal:no cyanosis of digits and no clubbing  PSYCH: alert & oriented x 3 with fluent speech; emotional tearful. NEURO: no focal motor/sensory deficits SKIN:  no rashes or significant lesions  LABORATORY DATA:  I have reviewed the data as listed    Component Value Date/Time   NA 136 07/28/2016 0814   NA 139 01/03/2015 0910   K 3.4 (L) 07/28/2016 0814   CL 106 07/28/2016 0814   CO2 25 07/28/2016 0814   GLUCOSE 153 (H) 07/28/2016 0814   BUN 14 07/28/2016 0814   BUN 10 01/03/2015 0910   CREATININE 0.58 07/28/2016 0814   CALCIUM 8.4 (L) 07/28/2016 0814   PROT 5.9 (L) 07/28/2016 0814   PROT 6.5 01/03/2015 0910   ALBUMIN 3.3 (L) 07/28/2016 0814   ALBUMIN 3.8 01/03/2015 0910   AST 26 07/28/2016 0814   ALT 20 07/28/2016 0814   ALKPHOS 56 07/28/2016 0814   BILITOT 0.6 07/28/2016 0814   BILITOT <0.2 01/03/2015 0910   GFRNONAA >60 07/28/2016  0814   GFRAA >60 07/28/2016 0814    No results found for: SPEP, UPEP  Lab Results  Component Value Date   WBC 3.4 (L) 07/28/2016   NEUTROABS 2.4 07/28/2016   HGB 7.6 (L) 07/28/2016   HCT 21.3 (L) 07/28/2016   MCV 116.7 (H) 07/28/2016   PLT 157 07/28/2016      Chemistry      Component Value Date/Time   NA 136 07/28/2016 0814   NA 139 01/03/2015 0910   K 3.4 (L) 07/28/2016 0814   CL 106 07/28/2016 0814   CO2 25 07/28/2016 0814   BUN 14 07/28/2016 0814   BUN 10 01/03/2015 0910   CREATININE 0.58 07/28/2016 0814      Component Value Date/Time   CALCIUM 8.4 (L) 07/28/2016 0814   ALKPHOS 56 07/28/2016 0814   AST 26 07/28/2016 0814   ALT 20 07/28/2016 0814   BILITOT 0.6 07/28/2016 0814   BILITOT <0.2 01/03/2015 0910       RADIOGRAPHIC STUDIES: I have personally reviewed the radiological images as listed and agreed with the findings in the report. No results found.   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 June 24th CT chest-STABLE disease. Tolerating chemotherapy with mild-to-moderate difficulties [see discussion below]; question worsening brain metastases versus radiation necrosis[ C discussion below]. Reviewed the notes from [facility] in detail. Also discussed with Dr. David Stall over the phone.   # Proceed with carbo Taxol; #4 with the addition of Avastin this cycle. Reviewed the rationale for using Avastin. Discussed the potential side effects including but not limited to elevated blood pressure ; nephrotic syndrome wound healing problems.  # Symptomatic brain metastases- worsening right frontal ~2 cm/vasogenic edema; subcentimeter cerebellar metastases/others- "progressive disease versus radiation necrosis". Recommend starting dexamethasone 4 mg once a day. Also hopefully Avastin will help her symptoms.  # Anemia- Hb 7.4; plan PRBC transfusion this week.   #  Myalgias- secondary to Taxol. Recommend Claritin/oxycodone.   # Steroid Myopathy/ Left LE weakness- ?recm 4 mg/day; discussed physical therapy/exercise  # follow up in 10 days; hold tube; labs; 3 weeks/MD; carbo-avastin/labs.   # 40 minutes face-to-face  with the patient discussing the above plan of care; more than 50% of time spent on prognosis/ natural history; counseling and coordination.  Orders Placed This Encounter  Procedures  . Urinalysis, Complete w Microscopic    Standing Status:   Future    Number of Occurrences:   1    Standing Expiration Date:   08/28/2016     Cammie Sickle, MD 07/28/2016 4:24 PM

## 2016-07-29 ENCOUNTER — Inpatient Hospital Stay: Payer: BLUE CROSS/BLUE SHIELD

## 2016-07-29 ENCOUNTER — Encounter: Payer: Self-pay | Admitting: *Deleted

## 2016-07-29 DIAGNOSIS — C3431 Malignant neoplasm of lower lobe, right bronchus or lung: Secondary | ICD-10-CM | POA: Diagnosis not present

## 2016-07-29 DIAGNOSIS — D649 Anemia, unspecified: Secondary | ICD-10-CM

## 2016-07-29 MED ORDER — ACETAMINOPHEN 325 MG PO TABS
650.0000 mg | ORAL_TABLET | Freq: Once | ORAL | Status: AC
  Administered 2016-07-29: 650 mg via ORAL
  Filled 2016-07-29: qty 2

## 2016-07-29 MED ORDER — SODIUM CHLORIDE 0.9% FLUSH
10.0000 mL | INTRAVENOUS | Status: AC | PRN
  Administered 2016-07-29: 10 mL
  Filled 2016-07-29: qty 10

## 2016-07-29 MED ORDER — DIPHENHYDRAMINE HCL 25 MG PO CAPS
25.0000 mg | ORAL_CAPSULE | Freq: Once | ORAL | Status: AC
  Administered 2016-07-29: 25 mg via ORAL
  Filled 2016-07-29: qty 1

## 2016-07-29 MED ORDER — HEPARIN SOD (PORK) LOCK FLUSH 100 UNIT/ML IV SOLN
500.0000 [IU] | Freq: Every day | INTRAVENOUS | Status: AC | PRN
Start: 2016-07-29 — End: 2016-07-29
  Administered 2016-07-29: 500 [IU]
  Filled 2016-07-29: qty 5

## 2016-07-29 MED ORDER — SODIUM CHLORIDE 0.9 % IV SOLN
250.0000 mL | Freq: Once | INTRAVENOUS | Status: AC
  Administered 2016-07-29: 250 mL via INTRAVENOUS
  Filled 2016-07-29: qty 250

## 2016-07-30 LAB — TYPE AND SCREEN
ABO/RH(D): O POS
ANTIBODY SCREEN: NEGATIVE
UNIT DIVISION: 0

## 2016-07-30 LAB — BPAM RBC
BLOOD PRODUCT EXPIRATION DATE: 201808052359
ISSUE DATE / TIME: 201807260914
UNIT TYPE AND RH: 9500

## 2016-08-05 ENCOUNTER — Inpatient Hospital Stay: Payer: BLUE CROSS/BLUE SHIELD

## 2016-08-05 ENCOUNTER — Inpatient Hospital Stay: Payer: BLUE CROSS/BLUE SHIELD | Attending: Internal Medicine

## 2016-08-05 ENCOUNTER — Telehealth: Payer: Self-pay | Admitting: *Deleted

## 2016-08-05 ENCOUNTER — Inpatient Hospital Stay (HOSPITAL_BASED_OUTPATIENT_CLINIC_OR_DEPARTMENT_OTHER): Payer: BLUE CROSS/BLUE SHIELD | Admitting: Internal Medicine

## 2016-08-05 VITALS — BP 130/89 | HR 87 | Temp 96.6°F | Resp 16 | Wt 124.4 lb

## 2016-08-05 DIAGNOSIS — F329 Major depressive disorder, single episode, unspecified: Secondary | ICD-10-CM | POA: Diagnosis not present

## 2016-08-05 DIAGNOSIS — R200 Anesthesia of skin: Secondary | ICD-10-CM | POA: Insufficient documentation

## 2016-08-05 DIAGNOSIS — D649 Anemia, unspecified: Secondary | ICD-10-CM | POA: Insufficient documentation

## 2016-08-05 DIAGNOSIS — M791 Myalgia: Secondary | ICD-10-CM | POA: Insufficient documentation

## 2016-08-05 DIAGNOSIS — R202 Paresthesia of skin: Secondary | ICD-10-CM | POA: Insufficient documentation

## 2016-08-05 DIAGNOSIS — M7989 Other specified soft tissue disorders: Secondary | ICD-10-CM | POA: Insufficient documentation

## 2016-08-05 DIAGNOSIS — D696 Thrombocytopenia, unspecified: Secondary | ICD-10-CM | POA: Diagnosis not present

## 2016-08-05 DIAGNOSIS — Z87891 Personal history of nicotine dependence: Secondary | ICD-10-CM | POA: Insufficient documentation

## 2016-08-05 DIAGNOSIS — T380X5A Adverse effect of glucocorticoids and synthetic analogues, initial encounter: Secondary | ICD-10-CM | POA: Insufficient documentation

## 2016-08-05 DIAGNOSIS — R531 Weakness: Secondary | ICD-10-CM | POA: Diagnosis not present

## 2016-08-05 DIAGNOSIS — C7931 Secondary malignant neoplasm of brain: Secondary | ICD-10-CM

## 2016-08-05 DIAGNOSIS — C3431 Malignant neoplasm of lower lobe, right bronchus or lung: Secondary | ICD-10-CM

## 2016-08-05 DIAGNOSIS — Z5111 Encounter for antineoplastic chemotherapy: Secondary | ICD-10-CM | POA: Diagnosis present

## 2016-08-05 DIAGNOSIS — G720 Drug-induced myopathy: Secondary | ICD-10-CM | POA: Insufficient documentation

## 2016-08-05 DIAGNOSIS — F419 Anxiety disorder, unspecified: Secondary | ICD-10-CM | POA: Insufficient documentation

## 2016-08-05 DIAGNOSIS — F418 Other specified anxiety disorders: Secondary | ICD-10-CM | POA: Diagnosis not present

## 2016-08-05 LAB — COMPREHENSIVE METABOLIC PANEL
ALT: 22 U/L (ref 14–54)
AST: 26 U/L (ref 15–41)
Albumin: 3.5 g/dL (ref 3.5–5.0)
Alkaline Phosphatase: 62 U/L (ref 38–126)
Anion gap: 10 (ref 5–15)
BILIRUBIN TOTAL: 0.5 mg/dL (ref 0.3–1.2)
BUN: 20 mg/dL (ref 6–20)
CO2: 23 mmol/L (ref 22–32)
CREATININE: 0.71 mg/dL (ref 0.44–1.00)
Calcium: 8.7 mg/dL — ABNORMAL LOW (ref 8.9–10.3)
Chloride: 104 mmol/L (ref 101–111)
GFR calc Af Amer: >60 mL/min (ref >60)
Glucose, Bld: 129 mg/dL — ABNORMAL HIGH (ref 65–99)
Potassium: 3.1 mmol/L — ABNORMAL LOW (ref 3.5–5.1)
Sodium: 137 mmol/L (ref 135–145)
TOTAL PROTEIN: 6.4 g/dL — AB (ref 6.5–8.1)

## 2016-08-05 LAB — CBC WITH DIFFERENTIAL/PLATELET
BASOS ABS: 0 10*3/uL (ref 0–0.1)
Basophils Relative: 1 %
Eosinophils Absolute: 0 10*3/uL (ref 0–0.7)
Eosinophils Relative: 1 %
HEMATOCRIT: 23.8 % — AB (ref 35.0–47.0)
Hemoglobin: 8.5 g/dL — ABNORMAL LOW (ref 12.0–16.0)
LYMPHS ABS: 1 10*3/uL (ref 1.0–3.6)
LYMPHS PCT: 62 %
MCH: 36.5 pg — ABNORMAL HIGH (ref 26.0–34.0)
MCHC: 35.5 g/dL (ref 32.0–36.0)
MCV: 102.6 fL — AB (ref 80.0–100.0)
MONO ABS: 0.1 10*3/uL — AB (ref 0.2–0.9)
Monocytes Relative: 9 %
NEUTROS ABS: 0.4 10*3/uL — AB (ref 1.4–6.5)
Neutrophils Relative %: 27 %
Platelets: 92 10*3/uL — ABNORMAL LOW (ref 150–440)
RBC: 2.32 MIL/uL — AB (ref 3.80–5.20)
RDW: 30 % — AB (ref 11.5–14.5)
WBC: 1.6 10*3/uL — ABNORMAL LOW (ref 3.6–11.0)

## 2016-08-05 LAB — SAMPLE TO BLOOD BANK

## 2016-08-05 MED ORDER — HEPARIN SOD (PORK) LOCK FLUSH 100 UNIT/ML IV SOLN
500.0000 [IU] | Freq: Once | INTRAVENOUS | Status: AC
  Administered 2016-08-05: 500 [IU] via INTRAVENOUS
  Filled 2016-08-05: qty 5

## 2016-08-05 MED ORDER — SODIUM CHLORIDE 0.9% FLUSH
10.0000 mL | Freq: Once | INTRAVENOUS | Status: AC
  Administered 2016-08-05: 10 mL via INTRAVENOUS
  Filled 2016-08-05: qty 10

## 2016-08-05 NOTE — Progress Notes (Signed)
[place] OFFICE PROGRESS NOTE  Patient Care Team: Mar Daring, PA-C as PCP - General (Family Medicine) Allyne Gee, MD as Referring Physician (Internal Medicine)  Malignant neoplasm of trachea, bronchus, and lung [facility])   Staging form: Lung, AJCC 7th Edition     Clinical: Stage IIIB (T4, N2, M0) - Signed by Cammie Sickle, MD on 06/24/2015    Oncology History   # FEB 2017-[place] Right Lower Lung T4N2M0- STAGE IIIB [Bronch & Subcarinal LNBx]- March 8th START CARBO-ALIMTA x4 cycles- June 8th 2017 - IMPROVED FDG MULTI-FOCAL lesions/N2-LN activity. S/p [place]-- Alimta x6  # AUG 18th- Alimta- Avastin maintenance; SEP 13th DISCONTINUE AVASTIN [sec to cavitary lesion]; OCT 20th 2017- CT-STABLE Disease; NOV 8th CT/PET [facility] clinic]- "overall slight progression-? Lymphangiectatic spread"  # April 25th 2018- Carbo- Taxol x3 ccyles; June 2018- CT chest STABLE Disease;July 25th- Add Avastin to [place] taxol  # NOV 2nd 2017-MRI, [facility]- Brain mets [asymptomatic;Right frontal ~70m; ~328mlesions -appx 3-4 lesions; (right Frontal & Left parietal s/p GK x 2 lesions] ; June 2018- Progession vs radiation necrosis [July 2th-Add Avastin]   --------------------------------------------------------------     MOLECULAR STUDIES:  KRAS MUTATED/Tissues not sufficient for OTHER the molecular marker; will need repeat Bx ; GUARDIANT TESTING [[facility]]- pending. [phone]/[facility]]    [place] cancer of right [place] lobe of lung ([place]    INTERVAL HISTORY:  WaVerania Salberg89.o.  female pleasant patient above history of Recurrent Metastatic adenocarcinoma the lung; And brain metastases- currently on carbotaxol Avastin cycle #4 approximately 10 days ago is here for follow-up.  Patient did not get Neulasta because of previous intolerance [myalgias].  Patient tolerated chemotherapy well. He complains of mild tingling and numbness in  hands and feet. Continues to complain of weakness in the legs. She is currently on steroids dexamethasone 4 mg a day.  LyMonia Pouchenies any falls. Denies any headaches. Denies any unusual shortness of breath or cough. No diarrhea no constipation. No hemoptysis.   REVIEW OF SYSTEMS:  A complete 10 point review of system is done which is negative except mentioned above/history of present illness.   PAST MEDICAL HISTORY :  Past Medical History:  Diagnosis Date  . Anxiety   . [place] right [place]  . Depression   . Depression with anxiety    Well controlled with Wellbutrin and Buspar  . Pneumonia     PAST SURGICAL HISTORY :   Past Surgical History:  Procedure Laterality Date  . ECTOPIC PREGNANCY SURGERY  1988  . ELECTROMAGNETIC NAVIGATION BROCHOSCOPY N/A 02/10/2015   Procedure: ELECTROMAGNETIC NAVIGATION BRONCHOSCOPY;  Surgeon: KuFlora LippsMD;  Location: [facility] ORS;  Service: Cardiopulmonary;  Laterality: N/A;  . ENDOBRONCHIAL ULTRASOUND N/A 02/10/2015   Procedure: ENDOBRONCHIAL ULTRASOUND;  Surgeon: KuFlora LippsMD;  Location: [facility] ORS;  Service: Cardiopulmonary;  Laterality: N/A;  . PERIPHERAL VASCULAR CATHETERIZATION N/A 03/05/2015   Procedure: PoGlori Luisath Insertion;  Surgeon: GrKatha CabalMD;  [place];  Service: Cardiovascular;  Laterality: N/A;    FAMILY HISTORY :   Family History  Problem Relation Age of Onset  . Cancer Father        Prostate Cancer  . Hypertension Father   . Stroke Father   . Hypertension Brother     SOCIAL HISTORY:   Social History  Substance Use Topics  . Smoking status: Former Smoker    Packs/day:  0.25    Years: 30.00    Types: Cigarettes    Quit date: 02/06/2005  . Smokeless tobacco: Never Used  . Alcohol use 3.0 oz/week    5 Cans of beer per week     Comment: 5 beers weekly    ALLERGIES:  has No Known Allergies.  MEDICATIONS:  Current Outpatient Prescriptions  Medication Sig Dispense Refill  .  acetaminophen (TYLENOL) 500 MG tablet Take 1,000 mg by mouth every 4 (four) hours as needed.     . busPIRone (BUSPAR) 5 MG tablet Take 1 tablet (5 mg total) by mouth 2 (two) times daily. 60 tablet 6  . cyclobenzaprine (FLEXERIL) 5 MG tablet Take 5 mg by mouth 3 (three) times daily as needed for muscle spasms.    [name] Kitchen dexamethasone (DECADRON) 4 MG tablet Take 1 tablet (4 mg total) by mouth daily. Take with food 30 tablet 0  . docusate sodium (COLACE) 100 MG capsule Take 100 mg by mouth 2 (two) times daily.    [name] Kitchen lidocaine-prilocaine (EMLA) cream Apply 1 application topically as needed. 30 g 2  . oxyCODONE (OXY IR/ROXICODONE) 5 MG immediate release tablet Take 1 tablet (5 mg total) by mouth every 6 (six) hours as needed for severe pain. 40 tablet 0  . Oxycodone HCl 10 MG TABS Take 1 tablet (10 mg total) by mouth every 6 (six) hours. 30 tablet 0  . SPIRIVA RESPIMAT 1.25 MCG/ACT AERS Inhale 1 puff into the lungs daily.     . SYMBICORT 80-4.5 MCG/ACT inhaler Inhale 1 puff into the lungs every 12 (twelve) hours.     [name] Kitchen zolpidem (AMBIEN) 5 MG tablet Take 1 tablet (5 mg total) by mouth at bedtime as needed. for sleep 30 tablet 2  . ondansetron (ZOFRAN) 8 MG tablet Take 1 tablet (8 mg total) by mouth every 8 (eight) hours as needed for nausea or vomiting (start 3 days; after chemo). (Patient not taking: Reported on 08/05/2016) 40 tablet 1  . prochlorperazine (COMPAZINE) 10 MG tablet Take 1 tablet (10 mg total) by mouth every 6 (six) hours as needed for nausea or vomiting. (Patient not taking: Reported on 08/05/2016) 25 tablet 1   No current facility-administered medications for this visit.     PHYSICAL EXAMINATION: ECOG PERFORMANCE STATUS: 0 - Asymptomatic  BP 130/89 (BP Location: Left Arm, Patient Position: Sitting)   Pulse 87   Temp (!) 96.6 F (35.9 C) (Tympanic)   Resp 16   Wt 124 lb 6.4 oz (56.4 kg)   LMP 06/16/2000 (Approximate) Comment: 15 yrs ago  BMI 22.75 kg/m   Filed Weights   08/05/16 0853   Weight: 124 lb 6.4 oz (56.4 kg)    GENERAL: Well-nourished well-developed; Alert, no distress and comfortable.   Accompanied by her husband.  EYES: no pallor or icterus OROPHARYNX: no thrush or ulceration; good dentition  NECK: supple, no masses felt LYMPH:  no palpable lymphadenopathy in the cervical, axillary or inguinal regions LUNGS: clear to auscultation and  No wheeze or crackles HEART/CVS: regular rate & rhythm and no murmurs; No lower extremity edema ABDOMEN:abdomen soft, non-tender and normal bowel sounds Musculoskeletal:no cyanosis of digits and no clubbing  PSYCH: alert & oriented x 3 with fluent speech; emotional tearful. NEURO: no focal motor/sensory deficits SKIN:  no rashes or significant lesions  LABORATORY DATA:  I have reviewed the data as listed    Component Value Date/Time   NA 137 08/05/2016 0818   NA 139 01/03/2015 0910   K  3.1 (L) 08/05/2016 0818   CL 104 08/05/2016 0818   CO2 23 08/05/2016 0818   GLUCOSE 129 (H) 08/05/2016 0818   BUN 20 08/05/2016 0818   BUN 10 01/03/2015 0910   CREATININE 0.71 08/05/2016 0818   CALCIUM 8.7 (L) 08/05/2016 0818   PROT 6.4 (L) 08/05/2016 0818   PROT 6.5 01/03/2015 0910   ALBUMIN 3.5 08/05/2016 0818   ALBUMIN 3.8 01/03/2015 0910   AST 26 08/05/2016 0818   ALT 22 08/05/2016 0818   ALKPHOS 62 08/05/2016 0818   BILITOT 0.5 08/05/2016 0818   BILITOT <0.2 01/03/2015 0910   GFRNONAA >60 08/05/2016 0818   GFRAA >60 08/05/2016 0818    No results found for: SPEP, UPEP  Lab Results  Component Value Date   WBC 1.6 (L) 08/05/2016   NEUTROABS 0.4 (L) 08/05/2016   HGB 8.5 (L) 08/05/2016   HCT 23.8 (L) 08/05/2016   MCV 102.6 (H) 08/05/2016   PLT 92 (L) 08/05/2016      Chemistry      Component Value Date/Time   NA 137 08/05/2016 0818   NA 139 01/03/2015 0910   K 3.1 (L) 08/05/2016 0818   CL 104 08/05/2016 0818   CO2 23 08/05/2016 0818   BUN 20 08/05/2016 0818   BUN 10 01/03/2015 0910   CREATININE 0.71 08/05/2016  0818      Component Value Date/Time   CALCIUM 8.7 (L) 08/05/2016 0818   ALKPHOS 62 08/05/2016 0818   AST 26 08/05/2016 0818   ALT 22 08/05/2016 0818   BILITOT 0.5 08/05/2016 0818   BILITOT <0.2 01/03/2015 0910       RADIOGRAPHIC STUDIES: I have personally reviewed the radiological images as listed and agreed with the findings in the report. No results found.   ASSESSMENT & PLAN:  Primary cancer of right lower lobe of lung ([place]) T4 N2 M1-adenocarcinoma of the lung right side-on cabo-Taxol- Avastin [ s/p cycle # 4; cycle #1 of Avastin [7/25]. June 24th CT chest-STABLE disease; Question worsening brain metastases versus radiation necrosis[ C discussion below].  # Patient seems to have tolerated chemotherapy well without myalgias [likely attributable to Neulasta]. As expectantly white count is low 1.6 ANC 400. [C discussion below].   # PN-1 sec to Taxol. Monitor for now.  # Symptomatic brain metastases- worsening right frontal ~2 cm/vasogenic edema; subcentimeter cerebellar metastases/others- "progressive disease versus radiation necrosis". Status post cycle #1 of Avastin ; taper dex to 2 mg/day.   # Anemia- Hb 7.4; s/p PRBC transfusion. Hb- 8.5; Thrombocytopenia- 94 sec to chemo; ANC- 400 sec to chemo; No neulasta [sec to intolerance ]. Discussed regarding neutropenic precautions. He'll call if having fevers.  # Steroid Myopathy/ Left LE weakness- Taper to 2 mg/day; declines physical therapy for now. Continue exercise.  # keep apt as scheduled.   No orders of the defined types were placed in this encounter.    Cammie Sickle, MD 08/05/2016 10:05 AM

## 2016-08-05 NOTE — Telephone Encounter (Signed)
Notified MD of Critical ANC 0.4mg .

## 2016-08-05 NOTE — Assessment & Plan Note (Addendum)
T4 N2 M1-adenocarcinoma of the lung right side-on cabo-Taxol- Avastin [ s/p cycle # 4; cycle #1 of Avastin [7/25]. June 24th CT chest-STABLE disease; Question worsening brain metastases versus radiation necrosis[ C discussion below].  # Patient seems to have tolerated chemotherapy well without myalgias [likely attributable to Neulasta]. As expectantly white count is low 1.6 ANC 400. [C discussion below].   # PN-1 sec to Taxol. Monitor for now.  # Symptomatic brain metastases- worsening right frontal ~2 cm/vasogenic edema; subcentimeter cerebellar metastases/others- "progressive disease versus radiation necrosis". Status post cycle #1 of Avastin ; taper dex to 2 mg/day.   # Anemia- Hb 7.4; s/p PRBC transfusion. Hb- 8.5; Thrombocytopenia- 94 sec to chemo; ANC- 400 sec to chemo; No neulasta [sec to intolerance ]. Discussed regarding neutropenic precautions. He'll call if having fevers.  # Steroid Myopathy/ Left LE weakness- Taper to 2 mg/day; declines physical therapy for now. Continue exercise.  # keep apt as scheduled.

## 2016-08-18 ENCOUNTER — Inpatient Hospital Stay: Payer: BLUE CROSS/BLUE SHIELD

## 2016-08-18 ENCOUNTER — Other Ambulatory Visit: Payer: Self-pay | Admitting: Internal Medicine

## 2016-08-18 ENCOUNTER — Inpatient Hospital Stay (HOSPITAL_BASED_OUTPATIENT_CLINIC_OR_DEPARTMENT_OTHER): Payer: BLUE CROSS/BLUE SHIELD | Admitting: Internal Medicine

## 2016-08-18 ENCOUNTER — Other Ambulatory Visit: Payer: Self-pay | Admitting: *Deleted

## 2016-08-18 VITALS — BP 129/63 | HR 65 | Temp 97.4°F | Resp 16 | Wt 130.8 lb

## 2016-08-18 VITALS — BP 154/80 | HR 80

## 2016-08-18 DIAGNOSIS — R531 Weakness: Secondary | ICD-10-CM

## 2016-08-18 DIAGNOSIS — C3431 Malignant neoplasm of lower lobe, right bronchus or lung: Secondary | ICD-10-CM

## 2016-08-18 DIAGNOSIS — D649 Anemia, unspecified: Secondary | ICD-10-CM

## 2016-08-18 DIAGNOSIS — F329 Major depressive disorder, single episode, unspecified: Secondary | ICD-10-CM | POA: Diagnosis not present

## 2016-08-18 DIAGNOSIS — F419 Anxiety disorder, unspecified: Secondary | ICD-10-CM | POA: Diagnosis not present

## 2016-08-18 DIAGNOSIS — R200 Anesthesia of skin: Secondary | ICD-10-CM

## 2016-08-18 DIAGNOSIS — R202 Paresthesia of skin: Secondary | ICD-10-CM | POA: Diagnosis not present

## 2016-08-18 DIAGNOSIS — F418 Other specified anxiety disorders: Secondary | ICD-10-CM

## 2016-08-18 DIAGNOSIS — M791 Myalgia: Secondary | ICD-10-CM

## 2016-08-18 DIAGNOSIS — Z87891 Personal history of nicotine dependence: Secondary | ICD-10-CM

## 2016-08-18 DIAGNOSIS — D696 Thrombocytopenia, unspecified: Secondary | ICD-10-CM | POA: Diagnosis not present

## 2016-08-18 DIAGNOSIS — Z5111 Encounter for antineoplastic chemotherapy: Secondary | ICD-10-CM | POA: Diagnosis not present

## 2016-08-18 DIAGNOSIS — C7931 Secondary malignant neoplasm of brain: Secondary | ICD-10-CM | POA: Diagnosis not present

## 2016-08-18 DIAGNOSIS — M7989 Other specified soft tissue disorders: Secondary | ICD-10-CM | POA: Diagnosis not present

## 2016-08-18 LAB — COMPREHENSIVE METABOLIC PANEL
ALBUMIN: 3.4 g/dL — AB (ref 3.5–5.0)
ALT: 16 U/L (ref 14–54)
ANION GAP: 8 (ref 5–15)
AST: 20 U/L (ref 15–41)
Alkaline Phosphatase: 53 U/L (ref 38–126)
BUN: 18 mg/dL (ref 6–20)
CALCIUM: 9 mg/dL (ref 8.9–10.3)
CHLORIDE: 103 mmol/L (ref 101–111)
CO2: 25 mmol/L (ref 22–32)
Creatinine, Ser: 0.63 mg/dL (ref 0.44–1.00)
GFR calc Af Amer: >60 mL/min (ref >60)
GFR calc non Af Amer: >60 mL/min (ref >60)
GLUCOSE: 88 mg/dL (ref 65–99)
Potassium: 3.6 mmol/L (ref 3.5–5.1)
SODIUM: 136 mmol/L (ref 135–145)
Total Bilirubin: 0.6 mg/dL (ref 0.3–1.2)
Total Protein: 6.2 g/dL — ABNORMAL LOW (ref 6.5–8.1)

## 2016-08-18 LAB — CBC WITH DIFFERENTIAL/PLATELET
BASOS PCT: 1 %
Basophils Absolute: 0 10*3/uL (ref 0–0.1)
EOS ABS: 0 10*3/uL (ref 0–0.7)
Eosinophils Relative: 0 %
HCT: 20.8 % — ABNORMAL LOW (ref 35.0–47.0)
HEMOGLOBIN: 7.4 g/dL — AB (ref 12.0–16.0)
Lymphocytes Relative: 27 %
Lymphs Abs: 1.4 10*3/uL (ref 1.0–3.6)
MCH: 37.4 pg — ABNORMAL HIGH (ref 26.0–34.0)
MCHC: 35.4 g/dL (ref 32.0–36.0)
MCV: 105.5 fL — ABNORMAL HIGH (ref 80.0–100.0)
Monocytes Absolute: 0.6 10*3/uL (ref 0.2–0.9)
Monocytes Relative: 12 %
NEUTROS PCT: 60 %
Neutro Abs: 3.1 10*3/uL (ref 1.4–6.5)
Platelets: 155 10*3/uL (ref 150–440)
RBC: 1.97 MIL/uL — AB (ref 3.80–5.20)
RDW: 31.5 % — ABNORMAL HIGH (ref 11.5–14.5)
WBC: 5.2 10*3/uL (ref 3.6–11.0)

## 2016-08-18 LAB — SAMPLE TO BLOOD BANK

## 2016-08-18 LAB — URINALYSIS, COMPLETE (UACMP) WITH MICROSCOPIC
BILIRUBIN URINE: NEGATIVE
Bacteria, UA: NONE SEEN
Glucose, UA: NEGATIVE mg/dL
Ketones, ur: NEGATIVE mg/dL
Nitrite: NEGATIVE
Protein, ur: NEGATIVE mg/dL
RBC / HPF: NONE SEEN RBC/hpf (ref 0–5)
SPECIFIC GRAVITY, URINE: 1.005 (ref 1.005–1.030)
pH: 6 (ref 5.0–8.0)

## 2016-08-18 LAB — PREPARE RBC (CROSSMATCH)

## 2016-08-18 MED ORDER — SODIUM CHLORIDE 0.9 % IV SOLN
Freq: Once | INTRAVENOUS | Status: AC
  Administered 2016-08-18: 10:00:00 via INTRAVENOUS
  Filled 2016-08-18: qty 1000

## 2016-08-18 MED ORDER — SODIUM CHLORIDE 0.9 % IV SOLN
Freq: Once | INTRAVENOUS | Status: DC
  Filled 2016-08-18: qty 1000

## 2016-08-18 MED ORDER — OXYCODONE HCL 10 MG PO TABS
10.0000 mg | ORAL_TABLET | Freq: Four times a day (QID) | ORAL | 0 refills | Status: DC
Start: 2016-08-18 — End: 2016-08-18

## 2016-08-18 MED ORDER — HEPARIN SOD (PORK) LOCK FLUSH 100 UNIT/ML IV SOLN
500.0000 [IU] | Freq: Once | INTRAVENOUS | Status: AC
  Administered 2016-08-18: 500 [IU] via INTRAVENOUS
  Filled 2016-08-18: qty 5

## 2016-08-18 MED ORDER — FAMOTIDINE IN NACL 20-0.9 MG/50ML-% IV SOLN
20.0000 mg | Freq: Once | INTRAVENOUS | Status: AC
  Administered 2016-08-18: 20 mg via INTRAVENOUS
  Filled 2016-08-18: qty 50

## 2016-08-18 MED ORDER — DIPHENHYDRAMINE HCL 50 MG/ML IJ SOLN
50.0000 mg | Freq: Once | INTRAMUSCULAR | Status: AC
  Administered 2016-08-18: 50 mg via INTRAVENOUS
  Filled 2016-08-18: qty 1

## 2016-08-18 MED ORDER — SODIUM CHLORIDE 0.9% FLUSH
10.0000 mL | INTRAVENOUS | Status: DC | PRN
  Administered 2016-08-18: 10 mL via INTRAVENOUS
  Filled 2016-08-18: qty 10

## 2016-08-18 MED ORDER — SODIUM CHLORIDE 0.9 % IV SOLN
Freq: Once | INTRAVENOUS | Status: AC
  Administered 2016-08-18: 10:00:00 via INTRAVENOUS
  Filled 2016-08-18: qty 5

## 2016-08-18 MED ORDER — OXYCODONE HCL 10 MG PO TABS: 10.0000 mg | ORAL_TABLET | Freq: Four times a day (QID) | ORAL | 0 refills | Status: DC

## 2016-08-18 MED ORDER — PALONOSETRON HCL INJECTION 0.25 MG/5ML
0.2500 mg | Freq: Once | INTRAVENOUS | Status: AC
  Administered 2016-08-18: 0.25 mg via INTRAVENOUS
  Filled 2016-08-18: qty 5

## 2016-08-18 MED ORDER — HEPARIN SOD (PORK) LOCK FLUSH 100 UNIT/ML IV SOLN: 500.0000 [IU] | Freq: Once | INTRAVENOUS | Status: DC | PRN

## 2016-08-18 MED ORDER — SODIUM CHLORIDE 0.9 % IV SOLN
474.0000 mg | Freq: Once | INTRAVENOUS | Status: AC
  Administered 2016-08-18: 470 mg via INTRAVENOUS
  Filled 2016-08-18: qty 47

## 2016-08-18 MED ORDER — DEXAMETHASONE 1 MG PO TABS: ORAL_TABLET | ORAL | 0 refills | Status: DC

## 2016-08-18 MED ORDER — SODIUM CHLORIDE 0.9 % IV SOLN
200.0000 mg/m2 | Freq: Once | INTRAVENOUS | Status: AC
  Administered 2016-08-18: 324 mg via INTRAVENOUS
  Filled 2016-08-18: qty 54

## 2016-08-18 MED ORDER — SODIUM CHLORIDE 0.9% FLUSH
10.0000 mL | INTRAVENOUS | Status: DC | PRN
  Filled 2016-08-18: qty 10

## 2016-08-18 MED ORDER — SODIUM CHLORIDE 0.9 % IV SOLN
900.0000 mg | Freq: Once | INTRAVENOUS | Status: AC
  Administered 2016-08-18: 900 mg via INTRAVENOUS
  Filled 2016-08-18: qty 32

## 2016-08-18 NOTE — Progress Notes (Signed)
hbg 7.4 today. Per Dr Rogue Bussing proceed with treatment today

## 2016-08-18 NOTE — Progress Notes (Signed)
[facility] OFFICE PROGRESS NOTE  Patient Care Team: Mar Daring, PA-C as PCP - General (Family Medicine) Allyne Gee, MD as Referring Physician (Internal Medicine)  Malignant neoplasm of trachea, bronchus, and lung [facility])   Staging form: Lung, AJCC 7th Edition     Clinical: Stage IIIB (T4, N2, M0) - Signed by Cammie Sickle, MD on 06/24/2015    Oncology History   # FEB 2017-[place] CA Right Lower Lung T4N2M0- STAGE IIIB [Bronch & Subcarinal LNBx]- March 8th START CARBO-ALIMTA x4 cycles- June 8th 2017 - IMPROVED FDG MULTI-FOCAL lesions/N2-LN activity. S/p [place]-- Alimta x6  # AUG 18th- [place]- Avastin maintenance; SEP 13th DISCONTINUE AVASTIN [sec to cavitary lesion]; OCT 20th 2017- CT-STABLE Disease; NOV 8th CT/PET [facility]]- "overall slight progression-? Lymphangiectatic spread"  # April 25th 2018- Carbo- Taxol x3 ccyles; June 2018- CT chest STABLE Disease;July 25th- Add Avastin to [place] taxol  # NOV 2nd 2017-MRI, [facility]- Brain mets [asymptomatic;Right frontal ~16m; ~362mlesions -appx 3-4 lesions; (right Frontal & Left parietal s/p GK x 2 lesions] ; June 2018- Progession vs radiation necrosis [July 2th-Add Avastin]   --------------------------------------------------------------     MOLECULAR STUDIES:  KRAS MUTATED/Tissues not sufficient for OTHER the molecular marker; will need repeat Bx ; GUARDIANT TESTING [[facility]]- pending. [phone]    [place] cancer of right [place] lobe of lung [place]    INTERVAL HISTORY:  Deanna Paiva89.o.  female pleasant patient above history of Recurrent Metastatic adenocarcinoma the lung; And brain metastases- currently on carbotaxol Avastin cycle #4 approximately 3 weeks ago is here for follow-up.  Complains of mild swelling in the legs. Currently resolved. Her weakness in the left lower extremity improved. She is currently taking dexamethasone 2 mg a day.  Denies any headaches. Complains of cracking of the skin on the soles.   Denies any falls. Denies any headaches. Denies any unusual shortness of breath or cough. No diarrhea no constipation. No hemoptysis. Since holding Neulasta-bodyaches improved. Complains of mild-to-moderate fatigue. No blood in stools.   REVIEW OF SYSTEMS:  A complete 10 point review of system is done which is negative except mentioned above/history of present illness.   PAST MEDICAL HISTORY :  Past Medical History:  Diagnosis Date  . Anxiety   . Cancer of right lung ([facility]  . Depression   . Depression with anxiety    Well controlled with Wellbutrin and Buspar  . Pneumonia     PAST SURGICAL HISTORY :   Past Surgical History:  Procedure Laterality Date  . ECTOPIC PREGNANCY SURGERY  1988  . ELECTROMAGNETIC NAVIGATION BROCHOSCOPY N/A 02/10/2015   Procedure: ELECTROMAGNETIC NAVIGATION BRONCHOSCOPY;  Surgeon: KuFlora LippsMD;  Location: [facility] ORS;  Service: Cardiopulmonary;  Laterality: N/A;  . ENDOBRONCHIAL ULTRASOUND N/A 02/10/2015   Procedure: ENDOBRONCHIAL ULTRASOUND;  Surgeon: KuFlora LippsMD;  Location: [facility] ORS;  Service: Cardiopulmonary;  Laterality: N/A;  . PERIPHERAL VASCULAR CATHETERIZATION N/A 03/05/2015   Procedure: PoGlori Luisath Insertion;  Surgeon: GrKatha CabalMD;  [facility];  Service: Cardiovascular;  Laterality: N/A;    FAMILY HISTORY :   Family History  Problem Relation Age of Onset  . Cancer Father        Prostate Cancer  . Hypertension Father   . Stroke Father   . Hypertension Brother     SOCIAL HISTORY:   Social History  Substance Use Topics  . Smoking status: Former Smoker  Packs/day: 0.25    Years: 30.00    Types: Cigarettes    Quit date: 02/06/2005  . Smokeless tobacco: Never Used  . Alcohol use 3.0 oz/week    5 Cans of beer per week     Comment: 5 beers weekly    ALLERGIES:  has No Known Allergies.  MEDICATIONS:  Current Outpatient Prescriptions   Medication Sig Dispense Refill  . acetaminophen (TYLENOL) 500 MG tablet Take 1,000 mg by mouth every 4 (four) hours as needed.     . busPIRone (BUSPAR) 5 MG tablet Take 1 tablet (5 mg total) by mouth 2 (two) times daily. 60 tablet 6  . cyclobenzaprine (FLEXERIL) 5 MG tablet Take 5 mg by mouth 3 (three) times daily as needed for muscle spasms.    [name] Kitchen dexamethasone (DECADRON) 1 MG tablet Take 1 pill one a day x7 days; and then one pill every other day x7. Take with food 12 tablet 0  . docusate sodium (COLACE) 100 MG capsule Take 100 mg by mouth 2 (two) times daily.    [name] Kitchen lidocaine-prilocaine (EMLA) cream Apply 1 application topically as needed. 30 g 2  . ondansetron (ZOFRAN) 8 MG tablet Take 1 tablet (8 mg total) by mouth every 8 (eight) hours as needed for nausea or vomiting (start 3 days; after chemo). 40 tablet 1  . oxyCODONE (OXY IR/ROXICODONE) 5 MG immediate release tablet Take 1 tablet (5 mg total) by mouth every 6 (six) hours as needed for severe pain. 40 tablet 0  . prochlorperazine (COMPAZINE) 10 MG tablet Take 1 tablet (10 mg total) by mouth every 6 (six) hours as needed for nausea or vomiting. 25 tablet 1  . SPIRIVA RESPIMAT 1.25 MCG/ACT AERS Inhale 1 puff into the lungs daily.     . SYMBICORT 80-4.5 MCG/ACT inhaler Inhale 1 puff into the lungs every 12 (twelve) hours.     [name] Kitchen zolpidem (AMBIEN) 5 MG tablet Take 1 tablet (5 mg total) by mouth at bedtime as needed. for sleep 30 tablet 2  . Oxycodone HCl 10 MG TABS Take 1 tablet (10 mg total) by mouth every 6 (six) hours. 30 tablet 0   No current facility-administered medications for this visit.    Facility-Administered Medications Ordered in Other Visits  Medication Dose Route Frequency Provider Last Rate Last Dose  . 0.9 %  sodium chloride infusion   Intravenous Once Charlaine Dalton R, MD      . CARBOplatin (PARAPLATIN) 470 mg in sodium chloride 0.9 % 250 mL chemo infusion  470 mg Intravenous Once Charlaine Dalton R, MD      .  heparin lock flush 100 unit/mL  500 Units Intravenous Once Charlaine Dalton R, MD      . heparin lock flush 100 unit/mL  500 Units Intracatheter Once PRN Cammie Sickle, MD      . PACLitaxel (TAXOL) 324 mg in sodium chloride 0.9 % 500 mL chemo infusion (> 24m/m2)  200 mg/m2 (Treatment Plan Recorded) Intravenous Once BCammie Sickle MD 185 mL/hr at 08/18/16 1115 324 mg at 08/18/16 1115  . sodium chloride flush (NS) 0.9 % injection 10 mL  10 mL Intravenous PRN BCammie Sickle MD   10 mL at 08/18/16 0808  . sodium chloride flush (NS) 0.9 % injection 10 mL  10 mL Intracatheter PRN BCammie Sickle MD        PHYSICAL EXAMINATION: ECOG PERFORMANCE STATUS: 0 - Asymptomatic  BP 129/63 (BP Location: Left Arm, Patient Position: Sitting)  Pulse 65   Temp (!) 97.4 F (36.3 C) (Tympanic)   Resp 16   Wt 130 lb 12.8 oz (59.3 kg)   LMP 06/16/2000 (Approximate) Comment: 15 yrs ago  BMI 23.92 kg/m   Filed Weights   08/18/16 0836  Weight: 130 lb 12.8 oz (59.3 kg)    GENERAL: Well-nourished well-developed; Alert, no distress and comfortable.   Accompanied by her husband.  EYES: no pallor or icterus OROPHARYNX: no thrush or ulceration; good dentition  NECK: supple, no masses felt LYMPH:  no palpable lymphadenopathy in the cervical, axillary or inguinal regions LUNGS: clear to auscultation and  No wheeze or crackles HEART/CVS: regular rate & rhythm and no murmurs; No lower extremity edema ABDOMEN:abdomen soft, non-tender and normal bowel sounds Musculoskeletal:no cyanosis of digits and no clubbing  PSYCH: alert & oriented x 3 with fluent speech; emotional tearful. NEURO: no focal motor/sensory deficits SKIN:  no rashes or significant lesions  LABORATORY DATA:  I have reviewed the data as listed    Component Value Date/Time   NA 136 08/18/2016 0817   NA 139 01/03/2015 0910   K 3.6 08/18/2016 0817   CL 103 08/18/2016 0817   CO2 25 08/18/2016 0817   GLUCOSE 88  08/18/2016 0817   BUN 18 08/18/2016 0817   BUN 10 01/03/2015 0910   CREATININE 0.63 08/18/2016 0817   CALCIUM 9.0 08/18/2016 0817   PROT 6.2 (L) 08/18/2016 0817   PROT 6.5 01/03/2015 0910   ALBUMIN 3.4 (L) 08/18/2016 0817   ALBUMIN 3.8 01/03/2015 0910   AST 20 08/18/2016 0817   ALT 16 08/18/2016 0817   ALKPHOS 53 08/18/2016 0817   BILITOT 0.6 08/18/2016 0817   BILITOT <0.2 01/03/2015 0910   GFRNONAA >60 08/18/2016 0817   GFRAA >60 08/18/2016 0817    No results found for: SPEP, UPEP  Lab Results  Component Value Date   WBC 5.2 08/18/2016   NEUTROABS 3.1 08/18/2016   HGB 7.4 (L) 08/18/2016   HCT 20.8 (L) 08/18/2016   MCV 105.5 (H) 08/18/2016   PLT 155 08/18/2016      Chemistry      Component Value Date/Time   NA 136 08/18/2016 0817   NA 139 01/03/2015 0910   K 3.6 08/18/2016 0817   CL 103 08/18/2016 0817   CO2 25 08/18/2016 0817   BUN 18 08/18/2016 0817   BUN 10 01/03/2015 0910   CREATININE 0.63 08/18/2016 0817      Component Value Date/Time   CALCIUM 9.0 08/18/2016 0817   ALKPHOS 53 08/18/2016 0817   AST 20 08/18/2016 0817   ALT 16 08/18/2016 0817   BILITOT 0.6 08/18/2016 0817   BILITOT <0.2 01/03/2015 0910       RADIOGRAPHIC STUDIES: I have personally reviewed the radiological images as listed and agreed with the findings in the report. No results found.   ASSESSMENT & PLAN:  Primary cancer of right lower lobe of lung ([place]) T4 N2 M1-adenocarcinoma of the lung right side-on cabo-Taxol- Avastin [ s/p cycle #4; cycle #1 of Avastin [7/25]. June 24th CT chest-STABLE disease; Question worsening brain metastases versus radiation necrosis[ C discussion below].   # Proceed with cycle #5- carbo-taxol with avastin. Tolerating well- except for anemia [see discussion below]  # Symptomatic brain metastases- worsening right frontal ~2 cm/vasogenic edema; subcentimeter cerebellar metastases/others- "progressive disease versus radiation necrosis". Status post cycle #1 of  Avastin ; taper dex to 68m/day x2 weeks. New script given.   # Anemia- Hb 7.4;sec to  chemo.  plan PRBC transfusion.   # Steroid Myopathy/ Left LE weakness- Taper to 1 mg/day; x2 weeks. New script given.  #follow up in 3 weeks/chemo/labs-hold tube; [facility] on [facility]th; labs - 10day/hold tube.    Orders Placed This Encounter  Procedures  . CBC with Differential/Platelet    Standing Status:   Future    Standing Expiration Date:   08/18/2017  . Basic metabolic panel    Standing Status:   Future    Standing Expiration Date:   08/18/2017  . CBC with Differential/Platelet    Standing Status:   Future    Standing Expiration Date:   08/18/2017  . Comprehensive metabolic panel    Standing Status:   Future    Standing Expiration Date:   08/18/2017  . Hold Tube- Blood Bank    Standing Status:   Future    Standing Expiration Date:   08/18/2017  . Hold Tube- Blood Bank    Standing Status:   Future    Number of Occurrences:   1    Standing Expiration Date:   08/18/2017  . Hold Tube- Blood Bank    Standing Status:   Future    Number of Occurrences:   1    Standing Expiration Date:   08/18/2017     Cammie Sickle, MD 08/18/2016 11:40 AM

## 2016-08-18 NOTE — Assessment & Plan Note (Addendum)
T4 N2 M1-adenocarcinoma of the lung right side-on cabo-Taxol- Avastin [ s/p cycle #4; cycle #1 of Avastin [7/25]. June 24th CT chest-STABLE disease; Question worsening brain metastases versus radiation necrosis[ C discussion below].   # Proceed with cycle #5- carbo-taxol with avastin. Tolerating well- except for anemia [see discussion below]  # Symptomatic brain metastases- worsening right frontal ~2 cm/vasogenic edema; subcentimeter cerebellar metastases/others- "progressive disease versus radiation necrosis". Status post cycle #1 of Avastin ; taper dex to 1mg /day x2 weeks. New script given.   # Anemia- Hb 7.4;sec to chemo.  plan PRBC transfusion.   # Steroid Myopathy/ Left LE weakness- Taper to 1 mg/day; x2 weeks. New script given.  #follow up in 3 weeks/chemo/labs-hold tube; [facility] on [facility]th; labs - 10day/hold tube.

## 2016-08-20 ENCOUNTER — Inpatient Hospital Stay: Payer: BLUE CROSS/BLUE SHIELD

## 2016-08-20 DIAGNOSIS — Z5111 Encounter for antineoplastic chemotherapy: Secondary | ICD-10-CM | POA: Diagnosis not present

## 2016-08-20 DIAGNOSIS — D649 Anemia, unspecified: Secondary | ICD-10-CM

## 2016-08-20 MED ORDER — SODIUM CHLORIDE 0.9% FLUSH
10.0000 mL | INTRAVENOUS | Status: AC | PRN
  Administered 2016-08-20: 10 mL
  Filled 2016-08-20: qty 10

## 2016-08-20 MED ORDER — HEPARIN SOD (PORK) LOCK FLUSH 100 UNIT/ML IV SOLN
500.0000 [IU] | Freq: Every day | INTRAVENOUS | Status: AC | PRN
  Administered 2016-08-20: 500 [IU]
  Filled 2016-08-20: qty 5

## 2016-08-20 MED ORDER — DIPHENHYDRAMINE HCL 25 MG PO CAPS
25.0000 mg | ORAL_CAPSULE | Freq: Once | ORAL | Status: AC
  Administered 2016-08-20: 25 mg via ORAL
  Filled 2016-08-20: qty 1

## 2016-08-20 MED ORDER — ACETAMINOPHEN 325 MG PO TABS
650.0000 mg | ORAL_TABLET | Freq: Once | ORAL | Status: AC
  Administered 2016-08-20: 650 mg via ORAL
  Filled 2016-08-20: qty 2

## 2016-08-20 MED ORDER — SODIUM CHLORIDE 0.9 % IV SOLN
250.0000 mL | Freq: Once | INTRAVENOUS | Status: AC
  Administered 2016-08-20: 250 mL via INTRAVENOUS
  Filled 2016-08-20: qty 250

## 2016-08-21 LAB — BPAM RBC
Blood Product Expiration Date: 201809042359
ISSUE DATE / TIME: 201808170939
UNIT TYPE AND RH: 5100

## 2016-08-21 LAB — TYPE AND SCREEN
ABO/RH(D): O POS
Antibody Screen: NEGATIVE
Unit division: 0

## 2016-08-26 ENCOUNTER — Other Ambulatory Visit: Payer: Self-pay | Admitting: *Deleted

## 2016-08-26 DIAGNOSIS — G620 Drug-induced polyneuropathy: Secondary | ICD-10-CM

## 2016-08-26 DIAGNOSIS — T451X5A Adverse effect of antineoplastic and immunosuppressive drugs, initial encounter: Principal | ICD-10-CM

## 2016-08-26 NOTE — Telephone Encounter (Signed)
Requesting a refill of her 800 mg IBU stating her whole body aches and her feet can no long stand being in cool water and her hands are also bothering her. Please advise as this medicine was discontinued on 05/19/16

## 2016-08-26 NOTE — Telephone Encounter (Signed)
Patient informed per VO Dr Rogue Bussing that she is to take over the counter ibuprofen I tablet twice a day times 10 days. She reports that does not work and that her pain in deep in her bones then asked if anything was mentioned about her hands and feet which are tingling when she puts them in water

## 2016-08-27 MED ORDER — GABAPENTIN 100 MG PO CAPS: 100.0000 mg | ORAL_CAPSULE | Freq: Three times a day (TID) | ORAL | 3 refills | Status: DC

## 2016-08-27 NOTE — Addendum Note (Signed)
Addended by: Sabino Gasser on: 08/27/2016 05:03 PM   Modules accepted: Orders

## 2016-08-27 NOTE — Telephone Encounter (Addendum)
New rx written for gabapentin 100 mg 1 tablet three times a day.  I spoke with patient. She gave verbal understanding the plan. Patient educated on the side effects of gabapentin. Teach back process performed.

## 2016-08-30 ENCOUNTER — Other Ambulatory Visit: Payer: Self-pay | Admitting: Internal Medicine

## 2016-08-30 ENCOUNTER — Inpatient Hospital Stay: Payer: BLUE CROSS/BLUE SHIELD

## 2016-08-30 DIAGNOSIS — C3431 Malignant neoplasm of lower lobe, right bronchus or lung: Secondary | ICD-10-CM

## 2016-08-30 DIAGNOSIS — Z5111 Encounter for antineoplastic chemotherapy: Secondary | ICD-10-CM | POA: Diagnosis not present

## 2016-08-30 DIAGNOSIS — G893 Neoplasm related pain (acute) (chronic): Secondary | ICD-10-CM

## 2016-08-30 LAB — CBC WITH DIFFERENTIAL/PLATELET
Basophils Absolute: 0 10*3/uL (ref 0–0.1)
Basophils Relative: 0 %
Eosinophils Absolute: 0 10*3/uL (ref 0–0.7)
Eosinophils Relative: 1 %
HEMATOCRIT: 24.9 % — AB (ref 35.0–47.0)
HEMOGLOBIN: 8.8 g/dL — AB (ref 12.0–16.0)
LYMPHS ABS: 1.3 10*3/uL (ref 1.0–3.6)
LYMPHS PCT: 53 %
MCH: 35.2 pg — AB (ref 26.0–34.0)
MCHC: 35.2 g/dL (ref 32.0–36.0)
MCV: 100.1 fL — AB (ref 80.0–100.0)
MONO ABS: 0.5 10*3/uL (ref 0.2–0.9)
MONOS PCT: 19 %
NEUTROS ABS: 0.7 10*3/uL — AB (ref 1.4–6.5)
Neutrophils Relative %: 27 %
Platelets: 77 10*3/uL — ABNORMAL LOW (ref 150–440)
RBC: 2.49 MIL/uL — ABNORMAL LOW (ref 3.80–5.20)
RDW: 28.8 % — AB (ref 11.5–14.5)
WBC: 2.5 10*3/uL — ABNORMAL LOW (ref 3.6–11.0)

## 2016-08-30 LAB — SAMPLE TO BLOOD BANK

## 2016-08-30 MED ORDER — OXYCODONE HCL 10 MG PO TABS: 10.0000 mg | ORAL_TABLET | Freq: Four times a day (QID) | ORAL | 0 refills | Status: DC

## 2016-09-08 ENCOUNTER — Inpatient Hospital Stay (HOSPITAL_BASED_OUTPATIENT_CLINIC_OR_DEPARTMENT_OTHER): Payer: BLUE CROSS/BLUE SHIELD | Admitting: Internal Medicine

## 2016-09-08 ENCOUNTER — Inpatient Hospital Stay: Payer: BLUE CROSS/BLUE SHIELD

## 2016-09-08 ENCOUNTER — Inpatient Hospital Stay: Payer: BLUE CROSS/BLUE SHIELD | Attending: Internal Medicine

## 2016-09-08 VITALS — BP 122/79 | HR 83 | Temp 97.4°F | Resp 16 | Wt 130.6 lb

## 2016-09-08 DIAGNOSIS — G720 Drug-induced myopathy: Secondary | ICD-10-CM | POA: Diagnosis not present

## 2016-09-08 DIAGNOSIS — G629 Polyneuropathy, unspecified: Secondary | ICD-10-CM | POA: Insufficient documentation

## 2016-09-08 DIAGNOSIS — C7931 Secondary malignant neoplasm of brain: Secondary | ICD-10-CM | POA: Insufficient documentation

## 2016-09-08 DIAGNOSIS — R531 Weakness: Secondary | ICD-10-CM | POA: Diagnosis not present

## 2016-09-08 DIAGNOSIS — Z8042 Family history of malignant neoplasm of prostate: Secondary | ICD-10-CM | POA: Insufficient documentation

## 2016-09-08 DIAGNOSIS — D649 Anemia, unspecified: Secondary | ICD-10-CM

## 2016-09-08 DIAGNOSIS — F419 Anxiety disorder, unspecified: Secondary | ICD-10-CM | POA: Insufficient documentation

## 2016-09-08 DIAGNOSIS — F329 Major depressive disorder, single episode, unspecified: Secondary | ICD-10-CM

## 2016-09-08 DIAGNOSIS — C3431 Malignant neoplasm of lower lobe, right bronchus or lung: Secondary | ICD-10-CM | POA: Insufficient documentation

## 2016-09-08 DIAGNOSIS — M791 Myalgia: Secondary | ICD-10-CM | POA: Diagnosis not present

## 2016-09-08 DIAGNOSIS — Z87891 Personal history of nicotine dependence: Secondary | ICD-10-CM | POA: Diagnosis not present

## 2016-09-08 DIAGNOSIS — T380X5A Adverse effect of glucocorticoids and synthetic analogues, initial encounter: Secondary | ICD-10-CM | POA: Insufficient documentation

## 2016-09-08 DIAGNOSIS — Z5111 Encounter for antineoplastic chemotherapy: Secondary | ICD-10-CM | POA: Insufficient documentation

## 2016-09-08 DIAGNOSIS — Z8701 Personal history of pneumonia (recurrent): Secondary | ICD-10-CM | POA: Diagnosis not present

## 2016-09-08 LAB — COMPREHENSIVE METABOLIC PANEL
ALK PHOS: 60 U/L (ref 38–126)
ALT: 12 U/L — ABNORMAL LOW (ref 14–54)
ANION GAP: 8 (ref 5–15)
AST: 20 U/L (ref 15–41)
Albumin: 3.7 g/dL (ref 3.5–5.0)
BUN: 15 mg/dL (ref 6–20)
CALCIUM: 9.4 mg/dL (ref 8.9–10.3)
CHLORIDE: 107 mmol/L (ref 101–111)
CO2: 23 mmol/L (ref 22–32)
CREATININE: 0.69 mg/dL (ref 0.44–1.00)
GFR calc non Af Amer: >60 mL/min (ref >60)
Glucose, Bld: 95 mg/dL (ref 65–99)
Potassium: 3.6 mmol/L (ref 3.5–5.1)
SODIUM: 138 mmol/L (ref 135–145)
Total Bilirubin: 0.5 mg/dL (ref 0.3–1.2)
Total Protein: 6.9 g/dL (ref 6.5–8.1)

## 2016-09-08 LAB — CBC WITH DIFFERENTIAL/PLATELET
Basophils Absolute: 0 10*3/uL (ref 0–0.1)
Basophils Relative: 1 %
EOS ABS: 0 10*3/uL (ref 0–0.7)
EOS PCT: 1 %
HCT: 23.9 % — ABNORMAL LOW (ref 35.0–47.0)
Hemoglobin: 8.5 g/dL — ABNORMAL LOW (ref 12.0–16.0)
LYMPHS ABS: 1 10*3/uL (ref 1.0–3.6)
LYMPHS PCT: 20 %
MCH: 36.4 pg — AB (ref 26.0–34.0)
MCHC: 35.6 g/dL (ref 32.0–36.0)
MCV: 102.3 fL — AB (ref 80.0–100.0)
MONOS PCT: 11 %
Monocytes Absolute: 0.5 10*3/uL (ref 0.2–0.9)
Neutro Abs: 3.4 10*3/uL (ref 1.4–6.5)
Neutrophils Relative %: 67 %
PLATELETS: 113 10*3/uL — AB (ref 150–440)
RBC: 2.33 MIL/uL — ABNORMAL LOW (ref 3.80–5.20)
RDW: 30.3 % — ABNORMAL HIGH (ref 11.5–14.5)
WBC: 5 10*3/uL (ref 3.6–11.0)

## 2016-09-08 MED ORDER — SODIUM CHLORIDE 0.9 % IV SOLN
900.0000 mg | Freq: Once | INTRAVENOUS | Status: AC
  Administered 2016-09-08: 900 mg via INTRAVENOUS
  Filled 2016-09-08: qty 32

## 2016-09-08 MED ORDER — SODIUM CHLORIDE 0.9 % IV SOLN
200.0000 mg/m2 | Freq: Once | INTRAVENOUS | Status: AC
  Administered 2016-09-08: 324 mg via INTRAVENOUS
  Filled 2016-09-08: qty 54

## 2016-09-08 MED ORDER — SODIUM CHLORIDE 0.9 % IV SOLN
Freq: Once | INTRAVENOUS | Status: AC
  Administered 2016-09-08: 10:00:00 via INTRAVENOUS
  Filled 2016-09-08: qty 5

## 2016-09-08 MED ORDER — PALONOSETRON HCL INJECTION 0.25 MG/5ML
0.2500 mg | Freq: Once | INTRAVENOUS | Status: AC
  Administered 2016-09-08: 0.25 mg via INTRAVENOUS
  Filled 2016-09-08: qty 5

## 2016-09-08 MED ORDER — FAMOTIDINE IN NACL 20-0.9 MG/50ML-% IV SOLN
20.0000 mg | Freq: Once | INTRAVENOUS | Status: AC
  Administered 2016-09-08: 20 mg via INTRAVENOUS
  Filled 2016-09-08: qty 50

## 2016-09-08 MED ORDER — DIPHENHYDRAMINE HCL 50 MG/ML IJ SOLN
50.0000 mg | Freq: Once | INTRAMUSCULAR | Status: AC
  Administered 2016-09-08: 50 mg via INTRAVENOUS
  Filled 2016-09-08: qty 1

## 2016-09-08 MED ORDER — SODIUM CHLORIDE 0.9% FLUSH
10.0000 mL | INTRAVENOUS | Status: DC | PRN
Start: 2016-09-08 — End: 2016-09-08
  Administered 2016-09-08: 10 mL via INTRAVENOUS
  Filled 2016-09-08: qty 10

## 2016-09-08 MED ORDER — SODIUM CHLORIDE 0.9 % IV SOLN
Freq: Once | INTRAVENOUS | Status: AC
Start: 2016-09-08 — End: 2016-09-08
  Administered 2016-09-08: 10:00:00 via INTRAVENOUS
  Filled 2016-09-08: qty 1000

## 2016-09-08 MED ORDER — HEPARIN SOD (PORK) LOCK FLUSH 100 UNIT/ML IV SOLN
500.0000 [IU] | Freq: Once | INTRAVENOUS | Status: AC
  Administered 2016-09-08: 500 [IU] via INTRAVENOUS
  Filled 2016-09-08: qty 5

## 2016-09-08 MED ORDER — SODIUM CHLORIDE 0.9 % IV SOLN
470.0000 mg | Freq: Once | INTRAVENOUS | Status: AC
  Administered 2016-09-08: 470 mg via INTRAVENOUS
  Filled 2016-09-08: qty 47

## 2016-09-08 NOTE — Assessment & Plan Note (Addendum)
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 June 24th CT chest-STABLE disease; Question worsening brain metastases versus radiation necrosis[ C discussion below].   # Proceed with cycle #6- carbo-taxol with avastin [#3 ]. Tolerating well- except for anemia [see discussion below].  Patient awaiting restaging CAT scans /brain MRI at [facility] clinic on September 10. Also discussed re: avastin maintenance every 3 weeks- moving forward.  # Symptomatic brain metastases- right frontal ~2 cm/vasogenic edema; subcentimeter cerebellar metastases/others- "progressive disease versus radiation necrosis". Clinically not worsening.  Status post cycle #2 of Avastin. Currently off steroids.   # Anemia- Hb 7.4;s/p transfusion- Hb- 8.5 today.   # PN-G-2. Sec to taxol; monitor closely. If worse would recommend  Neurontin.  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 Off steroids now.   # follow up in appx 3 weeks/ after viist to [facility] clinic.  Labs-hold tube/possible PRNC transfusion; follow up with me in 3 weeks/labs-UA/avastin alone.

## 2016-09-08 NOTE — Progress Notes (Signed)
Patient is here today for a follow up. Patient reports that since her last treatment, she reports having increasing joint and bone pain in both legs (pain scale 8-10). Patient also states that she has been taking Gabapentin as directed, but feels as the numbness/tingling in both feet is not improving.

## 2016-09-08 NOTE — Progress Notes (Signed)
[place] OFFICE PROGRESS NOTE  Patient Care Team: Mar Daring, PA-C as PCP - General (Family Medicine) Allyne Gee, MD as Referring Physician (Internal Medicine)  Malignant neoplasm of trachea, bronchus, and lung [facility])   Staging form: Lung, AJCC 7th Edition     Clinical: Stage IIIB (T4, N2, M0) - Signed by Cammie Sickle, MD on 06/24/2015    Oncology History   # FEB 2017-[place] CA Right Lower Lung T4N2M0- STAGE IIIB [Bronch & Subcarinal LNBx]- March 8th START CARBO-ALIMTA x4 cycles- June 8th 2017 - IMPROVED FDG MULTI-FOCAL lesions/N2-LN activity. S/p [place]-- Alimta x6  # AUG 18th- Alimta- Avastin maintenance; SEP 13th DISCONTINUE AVASTIN [sec to cavitary lesion]; OCT 20th 2017- CT-STABLE Disease; NOV 8th CT/PET [facility] clinic]- "overall slight progression-? Lymphangiectatic spread"  # April 25th 2018- Carbo- Taxol x3 ccyles; June 2018- CT chest STABLE Disease;July 25th- Add Avastin to [place] taxol  # NOV 2nd 2017-MRI, [facility]- Brain mets [asymptomatic;Right frontal ~67m; ~358mlesions -appx 3-4 lesions; (right Frontal & Left parietal s/p GK x 2 lesions] ; June 2018- Progession vs radiation necrosis [July 2th-Add Avastin]   --------------------------------------------------------------     MOLECULAR STUDIES:  KRAS MUTATED/Tissues not sufficient for OTHER the molecular marker; will need repeat Bx ; GUARDIANT TESTING [[facility]]- pending. [phone][facility]    [place] of [place] of [place]    INTERVAL HISTORY:  [name]89.o.  female pleasant patient above history of Recurrent Metastatic adenocarcinoma the lung; And brain metastases- currently on carbotaxol Avastin cycle #5 approximately 3 weeks ago is here for follow-up.  Patient's energy level is improved after blood transfusion approximately 3 weeks ago.  Patient is currently tapered off steroids completely. Denies any  worsening weakness in the left upper or lower extremity. She denies any headaches.  She complains of mild to moderate tingling and numbness in her hands and feet. At times she has stumbled; but no falls.  She unfortunately continues to have myalgias/body aches that lasts a few days after chemotherapy. Currently resolved  . REVIEW OF SYSTEMS:  A complete 10 point review of system is done which is negative except mentioned above/history of present illness.   PAST MEDICAL HISTORY :  Past Medical History:  Diagnosis Date  . Anxiety   . Cancer of right lung ([place]CD[place]2/09/2015  . Depression   . Depression with anxiety    Well controlled with Wellbutrin and Buspar  . Pneumonia     PAST SURGICAL HISTORY :   Past Surgical History:  Procedure Laterality Date  . ECTOPIC PREGNANCY SURGERY  1988  . ELECTROMAGNETIC NAVIGATION BROCHOSCOPY N/A 02/10/2015   Procedure: ELECTROMAGNETIC NAVIGATION BRONCHOSCOPY;  Surgeon: KuFlora LippsMD;  Location: [facility] ORS;  Service: Cardiopulmonary;  Laterality: N/A;  . ENDOBRONCHIAL ULTRASOUND N/A 02/10/2015   Procedure: ENDOBRONCHIAL ULTRASOUND;  Surgeon: KuFlora LippsMD;  Location: [facility] ORS;  Service: Cardiopulmonary;  Laterality: N/A;  . PERIPHERAL VASCULAR CATHETERIZATION N/A 03/05/2015   Procedure: PoGlori Luisath Insertion;  Surgeon: GrKatha CabalMD;  [place];  Service: Cardiovascular;  Laterality: N/A;    FAMILY HISTORY :   Family History  Problem Relation Age of Onset  . Cancer Father        Prostate Cancer  . Hypertension Father   . Stroke Father   . Hypertension Brother     SOCIAL HISTORY:   Social History  Substance Use Topics  . Smoking  status: Former Smoker    Packs/day: 0.25    Years: 30.00    Types: Cigarettes    Quit date: 02/06/2005  . Smokeless tobacco: Never Used  . Alcohol use 3.0 oz/week    5 Cans of beer per week     Comment: 5 beers weekly    ALLERGIES:  has No Known Allergies.  MEDICATIONS:  Current  Outpatient Prescriptions  Medication Sig Dispense Refill  . acetaminophen (TYLENOL) 500 MG tablet Take 1,000 mg by mouth every 4 (four) hours as needed.     . busPIRone (BUSPAR) 5 MG tablet Take 1 tablet (5 mg total) by mouth 2 (two) times daily. 60 tablet 6  . cyclobenzaprine (FLEXERIL) 5 MG tablet Take 5 mg by mouth 3 (three) times daily as needed for muscle spasms.    [name] Kitchen dexamethasone (DECADRON) 1 MG tablet Take 1 pill one a day x7 days; and then one pill every other day x7. Take with food 12 tablet 0  . docusate sodium (COLACE) 100 MG capsule Take 100 mg by mouth 2 (two) times daily.    [name] Kitchen gabapentin (NEURONTIN) 100 MG capsule Take 1 capsule (100 mg total) by mouth 3 (three) times daily. 84 capsule 3  . lidocaine-prilocaine (EMLA) cream Apply 1 application topically as needed. 30 g 2  . oxyCODONE (OXY IR/ROXICODONE) 5 MG immediate release tablet Take 1 tablet (5 mg total) by mouth every 6 (six) hours as needed for severe pain. 40 tablet 0  . Oxycodone HCl 10 MG TABS Take 1 tablet (10 mg total) by mouth every 6 (six) hours. 30 tablet 0  . SPIRIVA RESPIMAT 1.25 MCG/ACT AERS Inhale 1 puff into the lungs daily.     . SYMBICORT 80-4.5 MCG/ACT inhaler Inhale 1 puff into the lungs every 12 (twelve) hours.     [name] Kitchen zolpidem (AMBIEN) 5 MG tablet Take 1 tablet (5 mg total) by mouth at bedtime as needed. for sleep 30 tablet 2  . ondansetron (ZOFRAN) 8 MG tablet Take 1 tablet (8 mg total) by mouth every 8 (eight) hours as needed for nausea or vomiting (start 3 days; after chemo). (Patient not taking: Reported on 09/08/2016) 40 tablet 1  . prochlorperazine (COMPAZINE) 10 MG tablet Take 1 tablet (10 mg total) by mouth every 6 (six) hours as needed for nausea or vomiting. (Patient not taking: Reported on 09/08/2016) 25 tablet 1   No current facility-administered medications for this visit.     PHYSICAL EXAMINATION: ECOG PERFORMANCE STATUS: 0 - Asymptomatic  BP 122/79 (BP Location: Left Arm, Patient Position:  Sitting)   Pulse 83   Temp (!) 97.4 F (36.3 C) (Tympanic)   Resp 16   Wt 130 lb 9.6 oz (59.2 kg)   LMP 06/16/2000 (Approximate) Comment: 15 yrs ago  BMI 23.89 kg/m   Filed Weights   09/08/16 0852  Weight: 130 lb 9.6 oz (59.2 kg)    GENERAL: Well-nourished well-developed; Alert, no distress and comfortable. She is alone.  EYES: no pallor or icterus OROPHARYNX: no thrush or ulceration; good dentition  NECK: supple, no masses felt LYMPH:  no palpable lymphadenopathy in the cervical, axillary or inguinal regions LUNGS: clear to auscultation and  No wheeze or crackles HEART/CVS: regular rate & rhythm and no murmurs; No lower extremity edema ABDOMEN:abdomen soft, non-tender and normal bowel sounds Musculoskeletal:no cyanosis of digits and no clubbing  PSYCH: alert & oriented x 3 with fluent speech; emotional tearful. NEURO: no focal motor/sensory deficits SKIN:  no rashes or significant  lesions  LABORATORY DATA:  I have reviewed the data as listed    Component Value Date/Time   NA 138 09/08/2016 0834   NA 139 01/03/2015 0910   K 3.6 09/08/2016 0834   CL 107 09/08/2016 0834   CO2 23 09/08/2016 0834   GLUCOSE 95 09/08/2016 0834   BUN 15 09/08/2016 0834   BUN 10 01/03/2015 0910   CREATININE 0.69 09/08/2016 0834   CALCIUM 9.4 09/08/2016 0834   PROT 6.9 09/08/2016 0834   PROT 6.5 01/03/2015 0910   ALBUMIN 3.7 09/08/2016 0834   ALBUMIN 3.8 01/03/2015 0910   AST 20 09/08/2016 0834   ALT 12 (L) 09/08/2016 0834   ALKPHOS 60 09/08/2016 0834   BILITOT 0.5 09/08/2016 0834   BILITOT <0.2 01/03/2015 0910   GFRNONAA >60 09/08/2016 0834   GFRAA >60 09/08/2016 0834    No results found for: SPEP, UPEP  Lab Results  Component Value Date   WBC 5.0 09/08/2016   NEUTROABS 3.4 09/08/2016   HGB 8.5 (L) 09/08/2016   HCT 23.9 (L) 09/08/2016   MCV 102.3 (H) 09/08/2016   PLT 113 (L) 09/08/2016      Chemistry      Component Value Date/Time   NA 138 09/08/2016 0834   NA 139  01/03/2015 0910   K 3.6 09/08/2016 0834   CL 107 09/08/2016 0834   CO2 23 09/08/2016 0834   BUN 15 09/08/2016 0834   BUN 10 01/03/2015 0910   CREATININE 0.69 09/08/2016 0834      Component Value Date/Time   CALCIUM 9.4 09/08/2016 0834   ALKPHOS 60 09/08/2016 0834   AST 20 09/08/2016 0834   ALT 12 (L) 09/08/2016 0834   BILITOT 0.5 09/08/2016 0834   BILITOT <0.2 01/03/2015 0910       RADIOGRAPHIC STUDIES: I have personally reviewed the radiological images as listed and agreed with the findings in the report. No results found.   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 June 24th CT chest-STABLE disease; Question worsening brain metastases versus radiation necrosis[ C discussion below].   # Proceed with cycle #6- carbo-taxol with avastin [#3 ]. Tolerating well- except for anemia [see discussion below].  Patient awaiting restaging CAT scans /brain MRI at [facility] clinic on September 10. Also discussed re: avastin maintenance every 3 weeks- moving forward.  # Symptomatic brain metastases- right frontal ~2 cm/vasogenic edema; subcentimeter cerebellar metastases/others- "progressive disease versus radiation necrosis". Clinically not worsening.  Status post cycle #2 of Avastin. Currently off steroids.   # Anemia- Hb 7.4;s/p transfusion- Hb- 8.5 today.   # PN-G-2. Sec to taxol; monitor closely. If worse would recommend  Neurontin.  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 Off steroids now.   # follow up in appx 3 weeks/ after viist to [facility] clinic.  Labs-hold tube/possible PRNC transfusion; follow up with me in 3 weeks/labs-UA/avastin alone.  Orders Placed This Encounter  Procedures  . CBC with Differential/Platelet    Standing Status:   Future    Standing Expiration Date:   09/08/2017  . CBC with Differential/Platelet    Standing Status:   Future    Standing Expiration Date:    09/08/2017  . Comprehensive metabolic panel    Standing Status:   Future    Standing Expiration Date:   09/08/2017  . Urinalysis, Complete w Microscopic    Standing Status:   Future  Standing Expiration Date:   09/08/2017  . Hold Tube- Blood Bank    Standing Status:   Future    Standing Expiration Date:   09/08/2017     Cammie Sickle, MD 09/08/2016 6:36 PM

## 2016-09-16 ENCOUNTER — Other Ambulatory Visit: Payer: Self-pay | Admitting: Internal Medicine

## 2016-09-16 DIAGNOSIS — C3431 Malignant neoplasm of lower lobe, right bronchus or lung: Secondary | ICD-10-CM

## 2016-09-16 DIAGNOSIS — G893 Neoplasm related pain (acute) (chronic): Secondary | ICD-10-CM

## 2016-09-16 MED ORDER — OXYCODONE HCL 10 MG PO TABS: 10.0000 mg | ORAL_TABLET | Freq: Four times a day (QID) | ORAL | 0 refills | Status: DC

## 2016-09-17 ENCOUNTER — Other Ambulatory Visit: Payer: Self-pay | Admitting: *Deleted

## 2016-09-17 ENCOUNTER — Inpatient Hospital Stay: Payer: BLUE CROSS/BLUE SHIELD

## 2016-09-17 DIAGNOSIS — D649 Anemia, unspecified: Secondary | ICD-10-CM

## 2016-09-17 DIAGNOSIS — C3431 Malignant neoplasm of lower lobe, right bronchus or lung: Secondary | ICD-10-CM | POA: Diagnosis not present

## 2016-09-17 LAB — CBC WITH DIFFERENTIAL/PLATELET
BASOS ABS: 0 10*3/uL (ref 0–0.1)
Basophils Relative: 1 %
EOS ABS: 0 10*3/uL (ref 0–0.7)
EOS PCT: 0 %
HCT: 21.9 % — ABNORMAL LOW (ref 35.0–47.0)
Hemoglobin: 7.8 g/dL — ABNORMAL LOW (ref 12.0–16.0)
LYMPHS PCT: 25 %
Lymphs Abs: 0.7 10*3/uL — ABNORMAL LOW (ref 1.0–3.6)
MCH: 36 pg — ABNORMAL HIGH (ref 26.0–34.0)
MCHC: 35.4 g/dL (ref 32.0–36.0)
MCV: 101.7 fL — AB (ref 80.0–100.0)
MONO ABS: 0.4 10*3/uL (ref 0.2–0.9)
Monocytes Relative: 15 %
Neutro Abs: 1.6 10*3/uL (ref 1.4–6.5)
Neutrophils Relative %: 59 %
PLATELETS: 104 10*3/uL — AB (ref 150–440)
RBC: 2.15 MIL/uL — AB (ref 3.80–5.20)
RDW: 29.4 % — ABNORMAL HIGH (ref 11.5–14.5)
WBC: 2.7 10*3/uL — AB (ref 3.6–11.0)

## 2016-09-17 LAB — PREPARE RBC (CROSSMATCH)

## 2016-09-17 LAB — SAMPLE TO BLOOD BANK

## 2016-09-17 MED ORDER — ACETAMINOPHEN 325 MG PO TABS
650.0000 mg | ORAL_TABLET | Freq: Once | ORAL | Status: AC
  Administered 2016-09-17: 650 mg via ORAL
  Filled 2016-09-17: qty 2

## 2016-09-17 MED ORDER — DIPHENHYDRAMINE HCL 25 MG PO CAPS
25.0000 mg | ORAL_CAPSULE | Freq: Once | ORAL | Status: AC
  Administered 2016-09-17: 25 mg via ORAL
  Filled 2016-09-17: qty 1

## 2016-09-17 MED ORDER — HEPARIN SOD (PORK) LOCK FLUSH 100 UNIT/ML IV SOLN
500.0000 [IU] | Freq: Every day | INTRAVENOUS | Status: AC | PRN
  Administered 2016-09-17: 500 [IU]
  Filled 2016-09-17: qty 5

## 2016-09-17 MED ORDER — SODIUM CHLORIDE 0.9 % IV SOLN
250.0000 mL | Freq: Once | INTRAVENOUS | Status: AC
  Administered 2016-09-17: 250 mL via INTRAVENOUS
  Filled 2016-09-17: qty 250

## 2016-09-17 MED ORDER — SODIUM CHLORIDE 0.9% FLUSH
10.0000 mL | INTRAVENOUS | Status: AC | PRN
  Administered 2016-09-17: 10 mL
  Filled 2016-09-17: qty 10

## 2016-09-18 LAB — TYPE AND SCREEN
ABO/RH(D): O POS
ANTIBODY SCREEN: NEGATIVE
UNIT DIVISION: 0

## 2016-09-18 LAB — BPAM RBC
Blood Product Expiration Date: 201810052359
ISSUE DATE / TIME: 201809141046
Unit Type and Rh: 5100

## 2016-09-27 ENCOUNTER — Other Ambulatory Visit: Payer: Self-pay | Admitting: Internal Medicine

## 2016-09-27 DIAGNOSIS — G893 Neoplasm related pain (acute) (chronic): Secondary | ICD-10-CM

## 2016-09-27 DIAGNOSIS — C3431 Malignant neoplasm of lower lobe, right bronchus or lung: Secondary | ICD-10-CM

## 2016-09-28 MED ORDER — OXYCODONE HCL 10 MG PO TABS: 10.0000 mg | ORAL_TABLET | Freq: Four times a day (QID) | ORAL | 0 refills | Status: DC

## 2016-09-29 ENCOUNTER — Inpatient Hospital Stay: Payer: BLUE CROSS/BLUE SHIELD

## 2016-09-29 ENCOUNTER — Inpatient Hospital Stay (HOSPITAL_BASED_OUTPATIENT_CLINIC_OR_DEPARTMENT_OTHER): Payer: BLUE CROSS/BLUE SHIELD | Admitting: Internal Medicine

## 2016-09-29 VITALS — BP 114/77 | HR 84 | Temp 97.8°F | Resp 20 | Ht 62.0 in | Wt 125.6 lb

## 2016-09-29 DIAGNOSIS — C7931 Secondary malignant neoplasm of brain: Secondary | ICD-10-CM | POA: Diagnosis not present

## 2016-09-29 DIAGNOSIS — F419 Anxiety disorder, unspecified: Secondary | ICD-10-CM

## 2016-09-29 DIAGNOSIS — T380X5A Adverse effect of glucocorticoids and synthetic analogues, initial encounter: Secondary | ICD-10-CM | POA: Diagnosis not present

## 2016-09-29 DIAGNOSIS — Z87891 Personal history of nicotine dependence: Secondary | ICD-10-CM

## 2016-09-29 DIAGNOSIS — G720 Drug-induced myopathy: Secondary | ICD-10-CM | POA: Diagnosis not present

## 2016-09-29 DIAGNOSIS — Z8701 Personal history of pneumonia (recurrent): Secondary | ICD-10-CM

## 2016-09-29 DIAGNOSIS — R531 Weakness: Secondary | ICD-10-CM | POA: Diagnosis not present

## 2016-09-29 DIAGNOSIS — F329 Major depressive disorder, single episode, unspecified: Secondary | ICD-10-CM

## 2016-09-29 DIAGNOSIS — G629 Polyneuropathy, unspecified: Secondary | ICD-10-CM

## 2016-09-29 DIAGNOSIS — M791 Myalgia: Secondary | ICD-10-CM | POA: Diagnosis not present

## 2016-09-29 DIAGNOSIS — C3431 Malignant neoplasm of lower lobe, right bronchus or lung: Secondary | ICD-10-CM | POA: Diagnosis not present

## 2016-09-29 DIAGNOSIS — G620 Drug-induced polyneuropathy: Secondary | ICD-10-CM

## 2016-09-29 DIAGNOSIS — Z8042 Family history of malignant neoplasm of prostate: Secondary | ICD-10-CM | POA: Diagnosis not present

## 2016-09-29 DIAGNOSIS — D649 Anemia, unspecified: Secondary | ICD-10-CM

## 2016-09-29 DIAGNOSIS — T451X5A Adverse effect of antineoplastic and immunosuppressive drugs, initial encounter: Secondary | ICD-10-CM

## 2016-09-29 LAB — URINALYSIS, COMPLETE (UACMP) WITH MICROSCOPIC
Glucose, UA: NEGATIVE mg/dL
Hgb urine dipstick: NEGATIVE
Leukocytes, UA: NEGATIVE
Nitrite: NEGATIVE
PH: 5.5 (ref 5.0–8.0)
Specific Gravity, Urine: 1.02 (ref 1.005–1.030)

## 2016-09-29 LAB — CBC WITH DIFFERENTIAL/PLATELET
BASOS ABS: 0 10*3/uL (ref 0–0.1)
Basophils Relative: 0 %
EOS ABS: 0 10*3/uL (ref 0–0.7)
EOS PCT: 0 %
HCT: 26.5 % — ABNORMAL LOW (ref 35.0–47.0)
Hemoglobin: 9.5 g/dL — ABNORMAL LOW (ref 12.0–16.0)
Lymphocytes Relative: 36 %
Lymphs Abs: 1.4 10*3/uL (ref 1.0–3.6)
MCH: 35.7 pg — ABNORMAL HIGH (ref 26.0–34.0)
MCHC: 35.7 g/dL (ref 32.0–36.0)
MCV: 100 fL (ref 80.0–100.0)
Monocytes Absolute: 0.5 10*3/uL (ref 0.2–0.9)
Monocytes Relative: 13 %
Neutro Abs: 1.9 10*3/uL (ref 1.4–6.5)
Neutrophils Relative %: 51 %
PLATELETS: 123 10*3/uL — AB (ref 150–440)
RBC: 2.65 MIL/uL — AB (ref 3.80–5.20)
RDW: 28.4 % — ABNORMAL HIGH (ref 11.5–14.5)
WBC: 3.9 10*3/uL (ref 3.6–11.0)

## 2016-09-29 LAB — COMPREHENSIVE METABOLIC PANEL
ALBUMIN: 3.7 g/dL (ref 3.5–5.0)
ALT: 11 U/L — AB (ref 14–54)
AST: 23 U/L (ref 15–41)
Alkaline Phosphatase: 51 U/L (ref 38–126)
Anion gap: 10 (ref 5–15)
BUN: 9 mg/dL (ref 6–20)
CHLORIDE: 105 mmol/L (ref 101–111)
CO2: 22 mmol/L (ref 22–32)
CREATININE: 0.63 mg/dL (ref 0.44–1.00)
Calcium: 9.2 mg/dL (ref 8.9–10.3)
GFR calc Af Amer: >60 mL/min (ref >60)
GFR calc non Af Amer: >60 mL/min (ref >60)
Glucose, Bld: 129 mg/dL — ABNORMAL HIGH (ref 65–99)
Potassium: 3.4 mmol/L — ABNORMAL LOW (ref 3.5–5.1)
SODIUM: 137 mmol/L (ref 135–145)
Total Bilirubin: 0.3 mg/dL (ref 0.3–1.2)
Total Protein: 6.8 g/dL (ref 6.5–8.1)

## 2016-09-29 MED ORDER — SODIUM CHLORIDE 0.9 % IV SOLN
Freq: Once | INTRAVENOUS | Status: AC
  Administered 2016-09-29: 10:00:00 via INTRAVENOUS
  Filled 2016-09-29: qty 1000

## 2016-09-29 MED ORDER — HEPARIN SOD (PORK) LOCK FLUSH 100 UNIT/ML IV SOLN
500.0000 [IU] | Freq: Once | INTRAVENOUS | Status: AC | PRN
  Administered 2016-09-29: 500 [IU]

## 2016-09-29 MED ORDER — SODIUM CHLORIDE 0.9 % IV SOLN: 15.0000 mg/kg | Freq: Once | INTRAVENOUS | Status: DC

## 2016-09-29 MED ORDER — HEPARIN SOD (PORK) LOCK FLUSH 100 UNIT/ML IV SOLN
500.0000 [IU] | Freq: Once | INTRAVENOUS | Status: AC
  Filled 2016-09-29: qty 5

## 2016-09-29 MED ORDER — SODIUM CHLORIDE 0.9% FLUSH
10.0000 mL | INTRAVENOUS | Status: DC | PRN
  Administered 2016-09-29: 10 mL via INTRAVENOUS
  Filled 2016-09-29: qty 10

## 2016-09-29 MED ORDER — SODIUM CHLORIDE 0.9 % IV SOLN
900.0000 mg | Freq: Once | INTRAVENOUS | Status: AC
  Administered 2016-09-29: 900 mg via INTRAVENOUS
  Filled 2016-09-29: qty 32

## 2016-09-29 MED ORDER — GABAPENTIN 100 MG PO CAPS: 200.0000 mg | ORAL_CAPSULE | Freq: Three times a day (TID) | ORAL | 3 refills | Status: DC

## 2016-09-29 NOTE — Progress Notes (Signed)
Patient would like further md recommendations for mgmt of gabapentin. She states that the neuropathic pain is generally worse in am.

## 2016-09-29 NOTE — Progress Notes (Signed)
[place] OFFICE PROGRESS NOTE  Patient Care Team: Mar Daring, PA-C as PCP - General (Family Medicine) Allyne Gee, MD as Referring Physician (Internal Medicine)  Malignant neoplasm of trachea, bronchus, and lung [facility])   Staging form: Lung, AJCC 7th Edition     Clinical: Stage IIIB (T4, N2, M0) - Signed by Cammie Sickle, MD on 06/24/2015    Oncology History   # FEB 2017-[place] Right Lower Lung T4N2M0- STAGE IIIB [Bronch & Subcarinal LNBx]- March 8th START CARBO-ALIMTA x4 cycles- June 8th 2017 - IMPROVED FDG MULTI-FOCAL lesions/N2-LN activity. S/p [place]-- Alimta x6  # AUG 18th- [place]- Avastin maintenance; SEP 13th DISCONTINUE AVASTIN [sec to cavitary lesion]; OCT 20th 2017- CT-STABLE Disease; NOV 8th CT/PET [facility] clinic]- "overall slight progression-? Lymphangiectatic spread"  # April 25th 2018- Carbo- Taxol x3 ccyles; June 2018- CT chest STABLE Disease;July 25th- Add Avastin to [place] taxol  # NOV 2nd 2017-MRI, [facility]- Brain mets [asymptomatic;Right frontal ~68m; ~37mlesions -appx 3-4 lesions; (right Frontal & Left parietal s/p GK x 2 lesions] ; June 2018- Progession vs radiation necrosis [July 2th-Add Avastin]   --------------------------------------------------------------     MOLECULAR STUDIES:  KRAS MUTATED/Tissues not sufficient for OTHER the molecular marker; will need repeat Bx ; GUARDIANT TESTING [[facility]]- pending. [phone]/[facility]]    [place] cancer of right [place] lobe of lung [place]    INTERVAL HISTORY:  Deanna Downen89.o.  female pleasant patient above history of Recurrent Metastatic adenocarcinoma the lung; And brain metastases- currently on carbotaxol Avastin cycle #6 approximately 3 weeks ago is here for follow-up.  [facility] to have 4 small subcentimeter brain lesions for which she had the Gamma knife. The main right frontal  lesion gotten smaller; improve edema. [facility]  [facility] also [facility]usions [facility]. Patient is currently tapered off steroids completely. Denies any worsening weakness in the left upper or lower extremity. She denies any headaches.  She complains of mild to moderate tingling and numbness in her hands and feet. At times she has stumbled; but no falls. She is interested in going up on the dose of Neurontin.  Denies any muscle pain.  . [name] KitchenEVIEW OF SYSTEMS:  A complete 10 point review of system is done which is negative except mentioned above/history of present illness.   PAST MEDICAL HISTORY :  Past Medical History:  Diagnosis Date  . Anxiety   . Cancer of right lung ([place]2/09/2015  . Depression   . Depression with anxiety    Well controlled with Wellbutrin and Buspar  . Pneumonia     PAST SURGICAL HISTORY :   Past Surgical History:  Procedure Laterality Date  . ECTOPIC PREGNANCY SURGERY  1988  . ELECTROMAGNETIC NAVIGATION BROCHOSCOPY N/A 02/10/2015   Procedure: ELECTROMAGNETIC NAVIGATION BRONCHOSCOPY;  Surgeon: KuFlora LippsMD;  Location: [facility] ORS;  Service: Cardiopulmonary;  Laterality: N/A;  . ENDOBRONCHIAL ULTRASOUND N/A 02/10/2015   Procedure: ENDOBRONCHIAL ULTRASOUND;  Surgeon: KuFlora LippsMD;  Location: [facility] ORS;  Service: Cardiopulmonary;  Laterality: N/A;  . PERIPHERAL VASCULAR CATHETERIZATION N/A 03/05/2015   Procedure: PoGlori Luisath Insertion;  Surgeon: GrKatha CabalMD;  [place];  Service: Cardiovascular;  Laterality: N/A;    FAMILY HISTORY :   Family History  Problem Relation Age of Onset  .  Cancer Father        Prostate Cancer  . Hypertension Father   . Stroke Father   . Hypertension Brother     SOCIAL HISTORY:   Social History  Substance Use Topics  . Smoking status: Former Smoker    Packs/day: 0.25    Years: 30.00    Types: Cigarettes     Quit date: 02/06/2005  . Smokeless tobacco: Never Used  . Alcohol use 3.0 oz/week    5 Cans of beer per week     Comment: 5 beers weekly    ALLERGIES:  has No Known Allergies.  MEDICATIONS:  Current Outpatient Prescriptions  Medication Sig Dispense Refill  . acetaminophen (TYLENOL) 500 MG tablet Take 1,000 mg by mouth every 4 (four) hours as needed.     . busPIRone (BUSPAR) 5 MG tablet Take 1 tablet (5 mg total) by mouth 2 (two) times daily. 60 tablet 6  . cyclobenzaprine (FLEXERIL) 5 MG tablet Take 5 mg by mouth 3 (three) times daily as needed for muscle spasms.    [name] Kitchen docusate sodium (COLACE) 100 MG capsule Take 100 mg by mouth 2 (two) times daily.    [name] Kitchen gabapentin (NEURONTIN) 100 MG capsule Take 2 capsules (200 mg total) by mouth 3 (three) times daily. 180 capsule 3  . lidocaine-prilocaine (EMLA) cream Apply 1 application topically as needed. 30 g 2  . ondansetron (ZOFRAN) 8 MG tablet Take 1 tablet (8 mg total) by mouth every 8 (eight) hours as needed for nausea or vomiting (start 3 days; after chemo). (Patient not taking: Reported on 09/08/2016) 40 tablet 1  . Oxycodone HCl 10 MG TABS Take 1 tablet (10 mg total) by mouth every 6 (six) hours. 30 tablet 0  . prochlorperazine (COMPAZINE) 10 MG tablet Take 1 tablet (10 mg total) by mouth every 6 (six) hours as needed for nausea or vomiting. (Patient not taking: Reported on 09/08/2016) 25 tablet 1  . SPIRIVA RESPIMAT 1.25 MCG/ACT AERS Inhale 1 puff into the lungs daily.     . SYMBICORT 80-4.5 MCG/ACT inhaler Inhale 1 puff into the lungs every 12 (twelve) hours.     [name] Kitchen zolpidem (AMBIEN) 5 MG tablet Take 1 tablet (5 mg total) by mouth at bedtime as needed. for sleep 30 tablet 2   No current facility-administered medications for this visit.     PHYSICAL EXAMINATION: ECOG PERFORMANCE STATUS: 0 - Asymptomatic  BP 114/77 (BP Location: Left Arm, Patient Position: Sitting)   Pulse 84   Temp 97.8 F (36.6 C) (Tympanic)   Resp 20   Ht 5' 2"  (1.575 m)    Wt 125 lb 9.6 oz (57 kg)   LMP 06/16/2000 (Approximate) Comment: 15 yrs ago  BMI 22.97 kg/m   Filed Weights   09/29/16 0847  Weight: 125 lb 9.6 oz (57 kg)    GENERAL: Well-nourished well-developed; Alert, no distress and comfortable. She is accompanied by her husband.  EYES: no pallor or icterus OROPHARYNX: no thrush or ulceration; good dentition  NECK: supple, no masses felt LYMPH:  no palpable lymphadenopathy in the cervical, axillary or inguinal regions LUNGS: clear to auscultation and  No wheeze or crackles HEART/CVS: regular rate & rhythm and no murmurs; No lower extremity edema ABDOMEN:abdomen soft, non-tender and normal bowel sounds Musculoskeletal:no cyanosis of digits and no clubbing  PSYCH: alert & oriented x 3 with fluent speech; emotional tearful. NEURO: no focal motor/sensory deficits SKIN:  no rashes or significant lesions  LABORATORY DATA:  I have reviewed  the data as listed    Component Value Date/Time   NA 137 09/29/2016 0829   NA 139 01/03/2015 0910   K 3.4 (L) 09/29/2016 0829   CL 105 09/29/2016 0829   CO2 22 09/29/2016 0829   GLUCOSE 129 (H) 09/29/2016 0829   BUN 9 09/29/2016 0829   BUN 10 01/03/2015 0910   CREATININE 0.63 09/29/2016 0829   CALCIUM 9.2 09/29/2016 0829   PROT 6.8 09/29/2016 0829   PROT 6.5 01/03/2015 0910   ALBUMIN 3.7 09/29/2016 0829   ALBUMIN 3.8 01/03/2015 0910   AST 23 09/29/2016 0829   ALT 11 (L) 09/29/2016 0829   ALKPHOS 51 09/29/2016 0829   BILITOT 0.3 09/29/2016 0829   BILITOT <0.2 01/03/2015 0910   GFRNONAA >60 09/29/2016 0829   GFRAA >60 09/29/2016 0829    No results found for: SPEP, UPEP  Lab Results  Component Value Date   WBC 3.9 09/29/2016   NEUTROABS 1.9 09/29/2016   HGB 9.5 (L) 09/29/2016   HCT 26.5 (L) 09/29/2016   MCV 100.0 09/29/2016   PLT 123 (L) 09/29/2016      Chemistry      Component Value Date/Time   NA 137 09/29/2016 0829   NA 139 01/03/2015 0910   K 3.4 (L) 09/29/2016 0829   CL 105  09/29/2016 0829   CO2 22 09/29/2016 0829   BUN 9 09/29/2016 0829   BUN 10 01/03/2015 0910   CREATININE 0.63 09/29/2016 0829      Component Value Date/Time   CALCIUM 9.2 09/29/2016 0829   ALKPHOS 51 09/29/2016 0829   AST 23 09/29/2016 0829   ALT 11 (L) 09/29/2016 0829   BILITOT 0.3 09/29/2016 0829   BILITOT <0.2 01/03/2015 0910     09/14/2016 [compared to June 2018]  FINDINGS: There is a semisolid mass with underlying cystic changes and cavitation in the superior segment of the right lower lobe measuring slightly greater than 6 cm in size. This has not changed significantly since the prior outside examination. Peripheral groundglass and airspace infiltrates in the right lower lobe inferiorly are stable as are the groundglass infiltrates in the inferior right upper lobe, some of which contain cystic changes. Mild interlobular septal thickening in the right upper lobe persists.  2 mm lingular nodule (2/157) is unchanged. Right perihilar and lower lobe bronchial wall thickening is again noted. Right IJ Port-A-Cath tip in the upper RA. Old right rib fracture. Mild emphysematous changes in the upper lungs. No thoracic adenopathy by size criteria. ---------------------------------------------------------- 09/14/2016- IMPRESSION:  No CT evidence of metastatic disease in the abdomen and pelvis.   RADIOGRAPHIC STUDIES: I have personally reviewed the radiological images as listed and agreed with the findings in the report. No results found.   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 SEP 11th CT Chest [facility] clinic] STABLE disease; MRI Brain- stable/improved right frontal lesion ; however up to 4 new subcentimeter lesions.  # Recommend continued maintenance Avastin [patient received 6 cycles of carbo Taxol]. Patient tolerating treatment fairly well monitored closely for hemoptysis.   #  Symptomatic brain metastases- right frontal ~2 cm/vasogenic edema status post gamma knife [Nov 2017]; subcentimeter S/p GK in [facility] clinic Sep 2018 "4 more lesions".  # Anemia- Hb 9.4;s/p transfusion. Energy level is improved.  # PN-G-2; stumbling; . Sec to taxol; Recommend Neurontin to 200 TID; new script given.   # Steroid Myopathy/ Left LE  weakness- s/p taper. Off steroids now.   #  follow up with me in 3 weeks/labs-UA/avastin alone.  Orders Placed This Encounter  Procedures  . CBC with Differential/Platelet    Standing Status:   Future    Standing Expiration Date:   09/29/2017  . Comprehensive metabolic panel    Standing Status:   Future    Standing Expiration Date:   09/29/2017  . Urinalysis, Complete w Microscopic    Standing Status:   Future    Number of Occurrences:   1    Standing Expiration Date:   12/29/2016     Cammie Sickle, MD 09/29/2016 6:50 PM

## 2016-09-29 NOTE — Assessment & Plan Note (Addendum)
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 SEP 11th CT Chest [facility] clinic] STABLE disease; MRI Brain- stable/improved right frontal lesion ; however up to 4 new subcentimeter lesions.  # Recommend continued maintenance Avastin [patient received 6 cycles of carbo Taxol]. Patient tolerating treatment fairly well monitored closely for hemoptysis.   # Symptomatic brain metastases- right frontal ~2 cm/vasogenic edema status post gamma knife [Nov 2017]; subcentimeter S/p GK in [facility] clinic Sep 2018 "4 more lesions".  # Anemia- Hb 9.4;s/p transfusion. Energy level is improved.  # PN-G-2; stumbling; . Sec to taxol; Recommend Neurontin to 200 TID; new script given.   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 Off steroids now.   #  follow up with me in 3 weeks/labs-UA/avastin alone.

## 2016-10-05 ENCOUNTER — Telehealth: Payer: Self-pay | Admitting: *Deleted

## 2016-10-05 NOTE — Telephone Encounter (Signed)
Patient returned call to confirm receipt of message

## 2016-10-05 NOTE — Telephone Encounter (Signed)
Patient advised to reduce gabapentin to 1 cap three times a day per doctor Had to leave a message on her voice mail regarding this and to please call me back to confirm receipt of message

## 2016-10-05 NOTE — Telephone Encounter (Signed)
Per Dr. Rogue Bussing  - most hallucination most likely related to gabapentin increase. Please have patient take the gabapentin 1 capsule three times daily.

## 2016-10-05 NOTE — Telephone Encounter (Signed)
Patient reports she increased her gabapentin to 2 caps three times a day and is still taking her Oxycodone and that she was hallucinating last night. She si asking what she is to do. Please advise

## 2016-10-07 ENCOUNTER — Other Ambulatory Visit: Payer: Self-pay | Admitting: Oncology

## 2016-10-07 DIAGNOSIS — C3431 Malignant neoplasm of lower lobe, right bronchus or lung: Secondary | ICD-10-CM

## 2016-10-07 DIAGNOSIS — G893 Neoplasm related pain (acute) (chronic): Secondary | ICD-10-CM

## 2016-10-07 MED ORDER — OXYCODONE HCL 10 MG PO TABS: 10.0000 mg | ORAL_TABLET | Freq: Four times a day (QID) | ORAL | 0 refills | Status: DC

## 2016-10-20 ENCOUNTER — Other Ambulatory Visit: Payer: Self-pay | Admitting: *Deleted

## 2016-10-20 ENCOUNTER — Other Ambulatory Visit: Payer: BLUE CROSS/BLUE SHIELD

## 2016-10-20 ENCOUNTER — Ambulatory Visit: Payer: BLUE CROSS/BLUE SHIELD | Admitting: Internal Medicine

## 2016-10-20 ENCOUNTER — Ambulatory Visit: Payer: BLUE CROSS/BLUE SHIELD

## 2016-10-20 DIAGNOSIS — C3431 Malignant neoplasm of lower lobe, right bronchus or lung: Secondary | ICD-10-CM

## 2016-10-21 ENCOUNTER — Inpatient Hospital Stay: Payer: BLUE CROSS/BLUE SHIELD

## 2016-10-21 ENCOUNTER — Inpatient Hospital Stay (HOSPITAL_BASED_OUTPATIENT_CLINIC_OR_DEPARTMENT_OTHER): Payer: BLUE CROSS/BLUE SHIELD | Admitting: Internal Medicine

## 2016-10-21 ENCOUNTER — Inpatient Hospital Stay: Payer: BLUE CROSS/BLUE SHIELD | Attending: Internal Medicine

## 2016-10-21 VITALS — BP 131/86 | HR 70 | Temp 96.8°F | Resp 16 | Wt 127.8 lb

## 2016-10-21 DIAGNOSIS — G720 Drug-induced myopathy: Secondary | ICD-10-CM | POA: Diagnosis not present

## 2016-10-21 DIAGNOSIS — F419 Anxiety disorder, unspecified: Secondary | ICD-10-CM

## 2016-10-21 DIAGNOSIS — F329 Major depressive disorder, single episode, unspecified: Secondary | ICD-10-CM | POA: Diagnosis not present

## 2016-10-21 DIAGNOSIS — C7931 Secondary malignant neoplasm of brain: Secondary | ICD-10-CM

## 2016-10-21 DIAGNOSIS — Z5112 Encounter for antineoplastic immunotherapy: Secondary | ICD-10-CM | POA: Diagnosis not present

## 2016-10-21 DIAGNOSIS — C3431 Malignant neoplasm of lower lobe, right bronchus or lung: Secondary | ICD-10-CM | POA: Diagnosis not present

## 2016-10-21 DIAGNOSIS — Z79899 Other long term (current) drug therapy: Secondary | ICD-10-CM | POA: Diagnosis not present

## 2016-10-21 DIAGNOSIS — G620 Drug-induced polyneuropathy: Secondary | ICD-10-CM

## 2016-10-21 DIAGNOSIS — D649 Anemia, unspecified: Secondary | ICD-10-CM | POA: Diagnosis not present

## 2016-10-21 DIAGNOSIS — G629 Polyneuropathy, unspecified: Secondary | ICD-10-CM | POA: Insufficient documentation

## 2016-10-21 DIAGNOSIS — T451X5A Adverse effect of antineoplastic and immunosuppressive drugs, initial encounter: Secondary | ICD-10-CM

## 2016-10-21 DIAGNOSIS — R531 Weakness: Secondary | ICD-10-CM | POA: Insufficient documentation

## 2016-10-21 DIAGNOSIS — Z87891 Personal history of nicotine dependence: Secondary | ICD-10-CM | POA: Insufficient documentation

## 2016-10-21 DIAGNOSIS — Z8042 Family history of malignant neoplasm of prostate: Secondary | ICD-10-CM | POA: Diagnosis not present

## 2016-10-21 DIAGNOSIS — Z9221 Personal history of antineoplastic chemotherapy: Secondary | ICD-10-CM | POA: Insufficient documentation

## 2016-10-21 DIAGNOSIS — T380X5A Adverse effect of glucocorticoids and synthetic analogues, initial encounter: Secondary | ICD-10-CM | POA: Diagnosis not present

## 2016-10-21 DIAGNOSIS — Z8701 Personal history of pneumonia (recurrent): Secondary | ICD-10-CM | POA: Diagnosis not present

## 2016-10-21 LAB — URINALYSIS, COMPLETE (UACMP) WITH MICROSCOPIC
BILIRUBIN URINE: NEGATIVE
Bacteria, UA: NONE SEEN
Glucose, UA: NEGATIVE mg/dL
Ketones, ur: NEGATIVE mg/dL
Leukocytes, UA: NEGATIVE
Nitrite: NEGATIVE
PH: 5 (ref 5.0–8.0)
Protein, ur: NEGATIVE mg/dL
SPECIFIC GRAVITY, URINE: 1.015 (ref 1.005–1.030)

## 2016-10-21 LAB — CBC WITH DIFFERENTIAL/PLATELET
BASOS ABS: 0 10*3/uL (ref 0–0.1)
BASOS PCT: 1 %
EOS ABS: 0.1 10*3/uL (ref 0–0.7)
EOS PCT: 2 %
HEMATOCRIT: 29.5 % — AB (ref 35.0–47.0)
Hemoglobin: 10.3 g/dL — ABNORMAL LOW (ref 12.0–16.0)
Lymphocytes Relative: 28 %
Lymphs Abs: 1.1 10*3/uL (ref 1.0–3.6)
MCH: 37.4 pg — ABNORMAL HIGH (ref 26.0–34.0)
MCHC: 34.9 g/dL (ref 32.0–36.0)
MCV: 107.3 fL — ABNORMAL HIGH (ref 80.0–100.0)
MONO ABS: 0.4 10*3/uL (ref 0.2–0.9)
MONOS PCT: 11 %
NEUTROS ABS: 2.3 10*3/uL (ref 1.4–6.5)
Neutrophils Relative %: 58 %
PLATELETS: 250 10*3/uL (ref 150–440)
RBC: 2.75 MIL/uL — ABNORMAL LOW (ref 3.80–5.20)
RDW: 24.3 % — ABNORMAL HIGH (ref 11.5–14.5)
WBC: 3.9 10*3/uL (ref 3.6–11.0)

## 2016-10-21 LAB — COMPREHENSIVE METABOLIC PANEL
ALBUMIN: 3.9 g/dL (ref 3.5–5.0)
ALT: 10 U/L — ABNORMAL LOW (ref 14–54)
ANION GAP: 13 (ref 5–15)
AST: 24 U/L (ref 15–41)
Alkaline Phosphatase: 47 U/L (ref 38–126)
BUN: 10 mg/dL (ref 6–20)
CALCIUM: 9.4 mg/dL (ref 8.9–10.3)
CO2: 21 mmol/L — ABNORMAL LOW (ref 22–32)
Chloride: 105 mmol/L (ref 101–111)
Creatinine, Ser: 0.7 mg/dL (ref 0.44–1.00)
GFR calc Af Amer: >60 mL/min (ref >60)
GFR calc non Af Amer: >60 mL/min (ref >60)
GLUCOSE: 119 mg/dL — AB (ref 65–99)
POTASSIUM: 3.5 mmol/L (ref 3.5–5.1)
SODIUM: 139 mmol/L (ref 135–145)
TOTAL PROTEIN: 7 g/dL (ref 6.5–8.1)
Total Bilirubin: 0.6 mg/dL (ref 0.3–1.2)

## 2016-10-21 MED ORDER — SODIUM CHLORIDE 0.9 % IV SOLN
Freq: Once | INTRAVENOUS | Status: AC
  Administered 2016-10-21: 10:00:00 via INTRAVENOUS
  Filled 2016-10-21: qty 1000

## 2016-10-21 MED ORDER — HEPARIN SOD (PORK) LOCK FLUSH 100 UNIT/ML IV SOLN: 500.0000 [IU] | Freq: Once | INTRAVENOUS | Status: DC | PRN

## 2016-10-21 MED ORDER — SODIUM CHLORIDE 0.9 % IV SOLN
900.0000 mg | Freq: Once | INTRAVENOUS | Status: AC
  Administered 2016-10-21: 900 mg via INTRAVENOUS
  Filled 2016-10-21: qty 32

## 2016-10-21 MED ORDER — SODIUM CHLORIDE 0.9 % IV SOLN: 15.0000 mg/kg | Freq: Once | INTRAVENOUS | Status: DC

## 2016-10-21 MED ORDER — SODIUM CHLORIDE 0.9% FLUSH
10.0000 mL | INTRAVENOUS | Status: DC | PRN
  Administered 2016-10-21: 10 mL via INTRAVENOUS
  Filled 2016-10-21: qty 10

## 2016-10-21 MED ORDER — HEPARIN SOD (PORK) LOCK FLUSH 100 UNIT/ML IV SOLN
500.0000 [IU] | Freq: Once | INTRAVENOUS | Status: AC
  Administered 2016-10-21: 500 [IU] via INTRAVENOUS
  Filled 2016-10-21: qty 5

## 2016-10-21 NOTE — Assessment & Plan Note (Addendum)
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 SEP 11th CT Chest [facility] clinic] STABLE disease; MRI Brain- stable/improved right frontal lesion ; however up to 4 new subcentimeter lesions.  # continue maintenance Avastin . Patient tolerating treatment fairly well; monitored closely for hemoptysis.   # Symptomatic brain metastases- right frontal ~2 cm/vasogenic edema status post gamma knife [Nov 2017]; subcentimeter S/p GK in [facility] clinic Sep 2018 "4 more lesions".  # Anemia- Hb 10.5-improving. Energy level is improved.  # PN-G-2; stumbling; . Sec to taxol; intolerance to neurontin-hallucination; recommend starting back at smaller dose  # Fleeting body aches/pain- ? Etiology- Okay with NSAIDs;  [tylenol+ motrin BID]; continue oxycodone prn.   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 Off steroids now.   # follow up with me in 3 weeks/labs-UA/avastin alone.

## 2016-10-21 NOTE — Progress Notes (Signed)
[place] OFFICE PROGRESS NOTE  Patient Care Team: Mar Daring, PA-C as PCP - General (Family Medicine) Allyne Gee, MD as Referring Physician (Internal Medicine)  Malignant neoplasm of trachea, bronchus, and lung [facility])   Staging form: Lung, AJCC 7th Edition     Clinical: Stage IIIB (T4, N2, M0) - Signed by Cammie Sickle, MD on 06/24/2015    Oncology History   # FEB 2017-[place] Right Lower Lung T4N2M0- STAGE IIIB [Bronch & Subcarinal LNBx]- March 8th START CARBO-ALIMTA x4 cycles- June 8th 2017 - IMPROVED FDG MULTI-FOCAL lesions/N2-LN activity. S/p [place]-- Alimta x6  # AUG 18th- Alimta- Avastin maintenance; SEP 13th DISCONTINUE AVASTIN [sec to cavitary lesion]; OCT 20th 2017- CT-STABLE Disease; NOV 8th CT/PET [facility]]- "overall slight progression-? Lymphangiectatic spread"  # April 25th 2018- Carbo- Taxol x3 ccyles; June 2018- CT chest STABLE Disease;July 25th- Add Avastin to [place] taxol x6 cycles- Sep 2018-CT scan- [facility] STABLE   # Sep 2018- Avastin Maintence  # NOV 2nd 2017-MRI, [facility]- Brain mets [asymptomatic;Right frontal ~25m; ~390mlesions -appx 3-4 lesions; (right Frontal & Left parietal s/p GK x 2 lesions] ; June 2018- Progession vs radiation necrosis [July 2th-Add Avastin]  # [facility] 4 small subcentimeter brain lesions for which she had the Gamma knife. The main right frontal lesion gotten smaller; improve edema.   --------------------------------------------------------------     MOLECULAR STUDIES:  KRAS MUTATED/Tissues not sufficient for OTHER the molecular marker; will need repeat Bx ; [facility]ARDIANT TESTING [[facility]]- pending. [phone]    [place] [facility] [place]    INTERVAL HISTORY:  Deanna Sposito89.o.  female pleasant patient above history of Recurrent Metastatic adenocarcinoma the lung; And brain metastases- Currently on  Avastin maintenance is here for follow-up.  Patient denies any worsening headaches. Appetite is good. No weight loss.   She unfortunately continues to have tingling and numbness of her fingertips and toes. Patient noted to have "visual hallucinations" on Neurontin. Patient was taking 200 mg 3 times a day. Hallucinations resolved after stopping Neurontin.  Her energy levels are better. She denies any headaches. Denies any worsening weakness in her legs or arms.   REVIEW OF SYSTEMS:  A complete 10 point review of system is done which is negative except mentioned above/history of present illness.   PAST MEDICAL HISTORY :  Past Medical History:  Diagnosis Date  . Anxiety   . [place] right [place]  . Depression   . Depression with anxiety    Well controlled with Wellbutrin and Buspar  . Pneumonia     PAST SURGICAL HISTORY :   Past Surgical History:  Procedure Laterality Date  . ECTOPIC PREGNANCY SURGERY  1988  . ELECTROMAGNETIC NAVIGATION BROCHOSCOPY N/A 02/10/2015   Procedure: ELECTROMAGNETIC NAVIGATION BRONCHOSCOPY;  Surgeon: KuFlora LippsMD;  Location: [facility] ORS;  Service: Cardiopulmonary;  Laterality: N/A;  . ENDOBRONCHIAL ULTRASOUND N/A 02/10/2015   Procedure: ENDOBRONCHIAL ULTRASOUND;  Surgeon: KuFlora LippsMD;  Location: [facility] ORS;  Service: Cardiopulmonary;  Laterality: N/A;  . PERIPHERAL VASCULAR CATHETERIZATION N/A 03/05/2015   Procedure: PoGlori Luisath Insertion;  Surgeon: GrKatha CabalMD;  [facility];  Service: Cardiovascular;  Laterality: N/A;    FAMILY HISTORY :   Family History  Problem Relation Age of Onset  . Cancer Father        Prostate Cancer  . Hypertension Father   .  Stroke Father   . Hypertension Brother     SOCIAL HISTORY:   Social History  Substance Use Topics  . Smoking status: Former Smoker    Packs/day: 0.25    Years: 30.00    Types: Cigarettes    Quit date: 02/06/2005  . Smokeless tobacco: Never Used  . Alcohol use  3.0 oz/week    5 Cans of beer per week     Comment: 5 beers weekly    ALLERGIES:  has No Known Allergies.  MEDICATIONS:  Current Outpatient Prescriptions  Medication Sig Dispense Refill  . acetaminophen (TYLENOL) 500 MG tablet Take 1,000 mg by mouth every 4 (four) hours as needed.     . busPIRone (BUSPAR) 5 MG tablet Take 1 tablet (5 mg total) by mouth 2 (two) times daily. 60 tablet 6  . cyclobenzaprine (FLEXERIL) 5 MG tablet Take 5 mg by mouth 3 (three) times daily as needed for muscle spasms.    [name] Kitchen docusate sodium (COLACE) 100 MG capsule Take 100 mg by mouth 2 (two) times daily.    [name] Kitchen lidocaine-prilocaine (EMLA) cream Apply 1 application topically as needed. 30 g 2  . ondansetron (ZOFRAN) 8 MG tablet Take 1 tablet (8 mg total) by mouth every 8 (eight) hours as needed for nausea or vomiting (start 3 days; after chemo). 40 tablet 1  . Oxycodone HCl 10 MG TABS Take 1 tablet (10 mg total) by mouth every 6 (six) hours. 60 tablet 0  . prochlorperazine (COMPAZINE) 10 MG tablet Take 1 tablet (10 mg total) by mouth every 6 (six) hours as needed for nausea or vomiting. 25 tablet 1  . SPIRIVA RESPIMAT 1.25 MCG/ACT AERS Inhale 1 puff into the lungs daily.     . SYMBICORT 80-4.5 MCG/ACT inhaler Inhale 1 puff into the lungs every 12 (twelve) hours.     [name] Kitchen zolpidem (AMBIEN) 5 MG tablet Take 1 tablet (5 mg total) by mouth at bedtime as needed. for sleep 30 tablet 2  . gabapentin (NEURONTIN) 100 MG capsule Take 2 capsules (200 mg total) by mouth 3 (three) times daily. (Patient not taking: Reported on 10/21/2016) 180 capsule 3   No current facility-administered medications for this visit.     PHYSICAL EXAMINATION: ECOG PERFORMANCE STATUS: 0 - Asymptomatic  BP 131/86 (BP Location: Left Arm, Patient Position: Sitting)   Pulse 70   Temp (!) 96.8 F (36 C) (Tympanic)   Resp 16   Wt 127 lb 12.8 oz (58 kg)   LMP 06/16/2000 (Approximate) Comment: 15 yrs ago  BMI 23.37 kg/m   Filed Weights   10/21/16  0916  Weight: 127 lb 12.8 oz (58 kg)    GENERAL: Well-nourished well-developed; Alert, no distress and comfortable. She is accompanied by her husband.  EYES: no pallor or icterus OROPHARYNX: no thrush or ulceration; good dentition  NECK: supple, no masses felt LYMPH:  no palpable lymphadenopathy in the cervical, axillary or inguinal regions LUNGS: clear to auscultation and  No wheeze or crackles HEART/CVS: regular rate & rhythm and no murmurs; No lower extremity edema ABDOMEN:abdomen soft, non-tender and normal bowel sounds Musculoskeletal:no cyanosis of digits and no clubbing  PSYCH: alert & oriented x 3 with fluent speech.  NEURO: no focal motor/sensory deficits SKIN:  no rashes or significant lesions  LABORATORY DATA:  I have reviewed the data as listed    Component Value Date/Time   NA 139 10/21/2016 0850   NA 139 01/03/2015 0910   K 3.5 10/21/2016 0850   CL  105 10/21/2016 0850   CO2 21 (L) 10/21/2016 0850   GLUCOSE 119 (H) 10/21/2016 0850   BUN 10 10/21/2016 0850   BUN 10 01/03/2015 0910   CREATININE 0.70 10/21/2016 0850   CALCIUM 9.4 10/21/2016 0850   PROT 7.0 10/21/2016 0850   PROT 6.5 01/03/2015 0910   ALBUMIN 3.9 10/21/2016 0850   ALBUMIN 3.8 01/03/2015 0910   AST 24 10/21/2016 0850   ALT 10 (L) 10/21/2016 0850   ALKPHOS 47 10/21/2016 0850   BILITOT 0.6 10/21/2016 0850   BILITOT <0.2 01/03/2015 0910   GFRNONAA >60 10/21/2016 0850   GFRAA >60 10/21/2016 0850    No results found for: SPEP, UPEP  Lab Results  Component Value Date   WBC 3.9 10/21/2016   NEUTROABS 2.3 10/21/2016   HGB 10.3 (L) 10/21/2016   HCT 29.5 (L) 10/21/2016   MCV 107.3 (H) 10/21/2016   PLT 250 10/21/2016      Chemistry      Component Value Date/Time   NA 139 10/21/2016 0850   NA 139 01/03/2015 0910   K 3.5 10/21/2016 0850   CL 105 10/21/2016 0850   CO2 21 (L) 10/21/2016 0850   BUN 10 10/21/2016 0850   BUN 10 01/03/2015 0910   CREATININE 0.70 10/21/2016 0850      Component  Value Date/Time   CALCIUM 9.4 10/21/2016 0850   ALKPHOS 47 10/21/2016 0850   AST 24 10/21/2016 0850   ALT 10 (L) 10/21/2016 0850   BILITOT 0.6 10/21/2016 0850   BILITOT <0.2 01/03/2015 0910     09/14/2016 [compared to June 2018]  FINDINGS: There is a semisolid mass with underlying cystic changes and cavitation in the superior segment of the right lower lobe measuring slightly greater than 6 cm in size. This has not changed significantly since the prior outside examination. Peripheral groundglass and airspace infiltrates in the right lower lobe inferiorly are stable as are the groundglass infiltrates in the inferior right upper lobe, some of which contain cystic changes. Mild interlobular septal thickening in the right upper lobe persists.  2 mm lingular nodule (2/157) is unchanged. Right perihilar and lower lobe bronchial wall thickening is again noted. Right IJ Port-A-Cath tip in the upper RA. Old right rib fracture. Mild emphysematous changes in the upper lungs. No thoracic adenopathy by size criteria. ---------------------------------------------------------- 09/14/2016- IMPRESSION:  No CT evidence of metastatic disease in the abdomen and pelvis.   RADIOGRAPHIC STUDIES: I have personally reviewed the radiological images as listed and agreed with the findings in the report. No results found.   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 SEP 11th CT [facility] clinic] STABLE disease; MRI Brain- stable/improved right frontal lesion ; however up to 4 new subcentimeter lesions.  # continue maintenance Avastin . Patient tolerating treatment fairly well; monitored closely for hemoptysis.   # Symptomatic brain metastases- right frontal ~2 cm/vasogenic edema status post gamma knife [Nov 2017]; subcentimeter S/p GK in [facility] clinic Sep 2018 "4 more lesions".  # Anemia- Hb 10.5-improving. Energy  level is improved.  # PN-G-2; stumbling; . Sec to taxol; intolerance to neurontin-hallucination; recommend starting back at smaller dose  # Fleeting body aches/pain- ? Etiology- Okay with NSAIDs;  [tylenol+ motrin BID]; continue oxycodone prn.   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 Off steroids now.   # follow up with me in 3 weeks/labs-UA/avastin alone.  Orders Placed This  Encounter  Procedures  . Urinalysis, Complete w Microscopic    Standing Status:   Standing    Number of Occurrences:   20    Standing Expiration Date:   10/21/2017     Cammie Sickle, MD 10/21/2016 6:03 PM

## 2016-10-23 IMAGING — CR DG CHEST 2V
1 series · 2 of 2 positions shown · non-contrast
Comparison: CT dated 01/31/2015 chest radiograph dated 01/18/2015

CLINICAL DATA: 59-year-old female with cough

EXAM:
CHEST  2 VIEW

[Series 1: dg chest 2 view · 0.14mm/px · 2 of 2 slices shown]
[im 1/2]
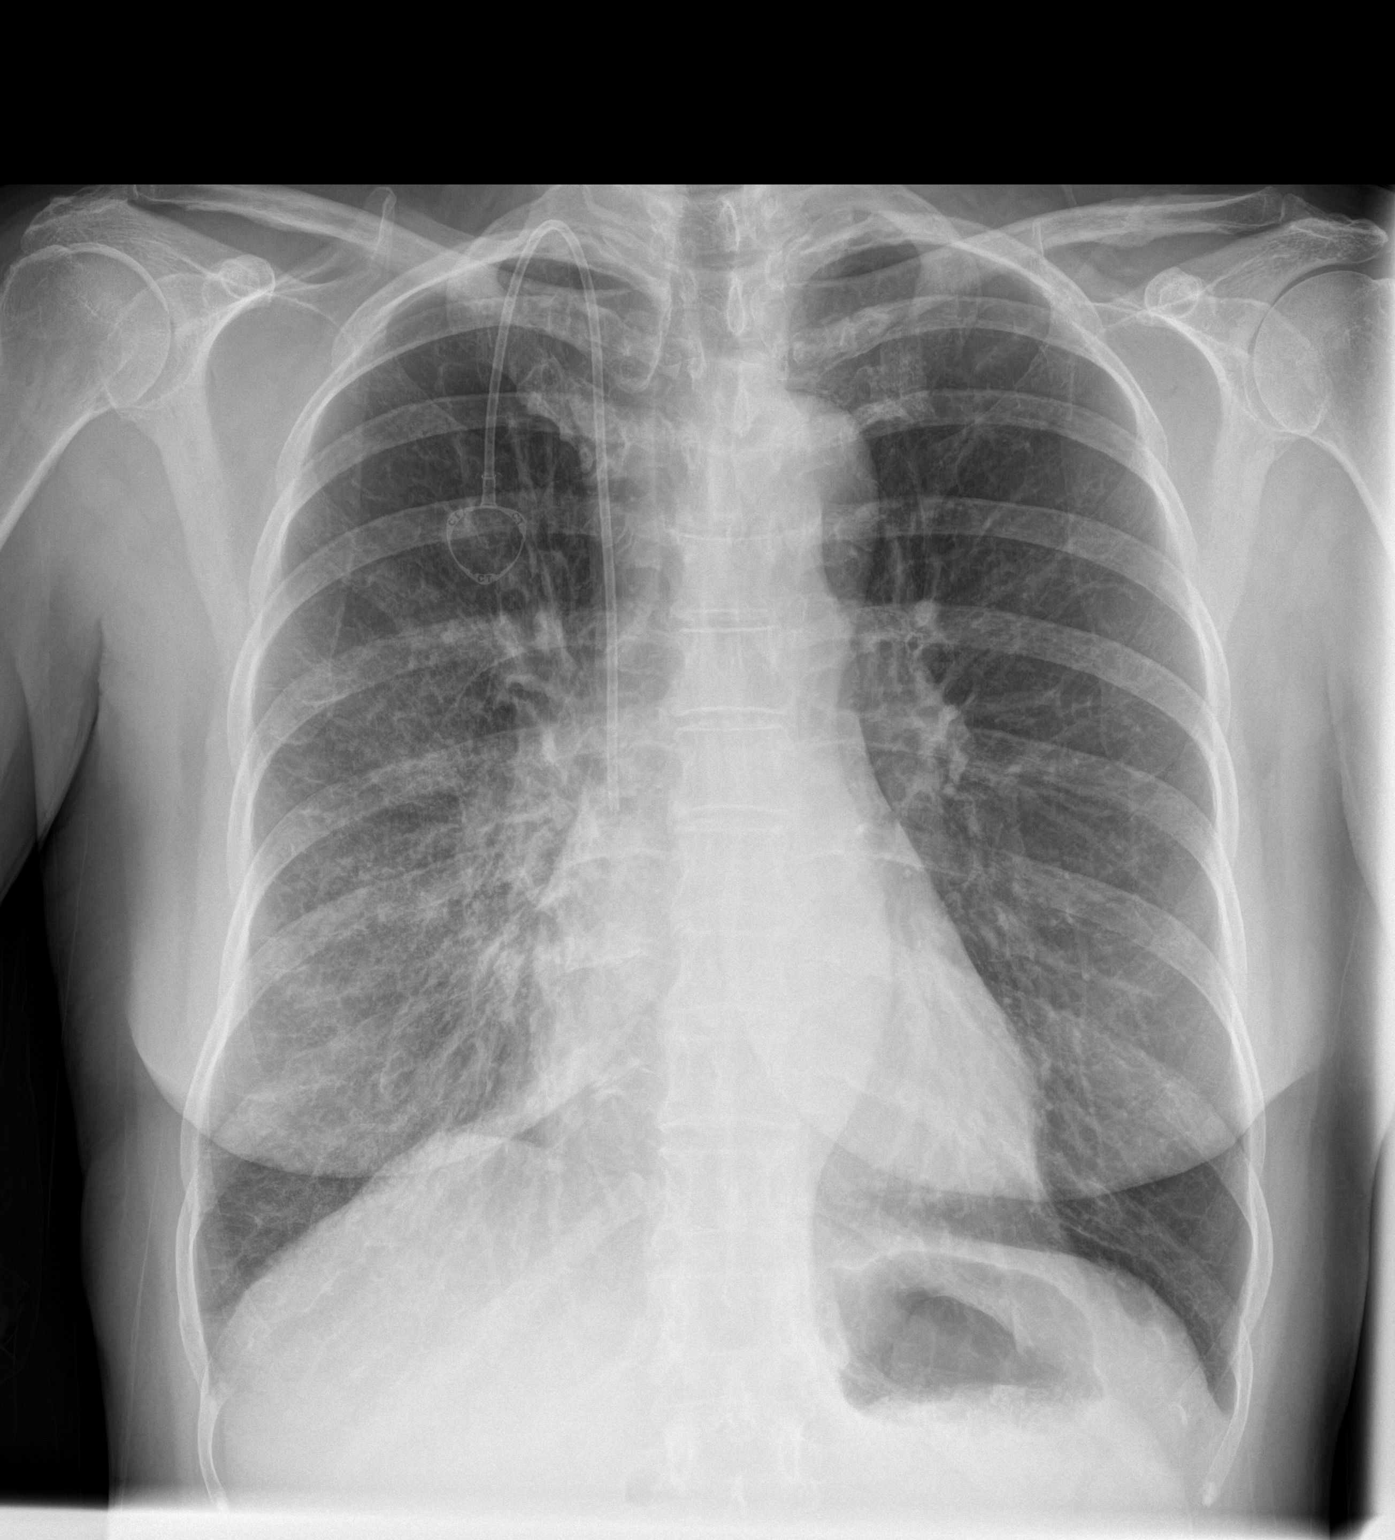
[im 2/2]
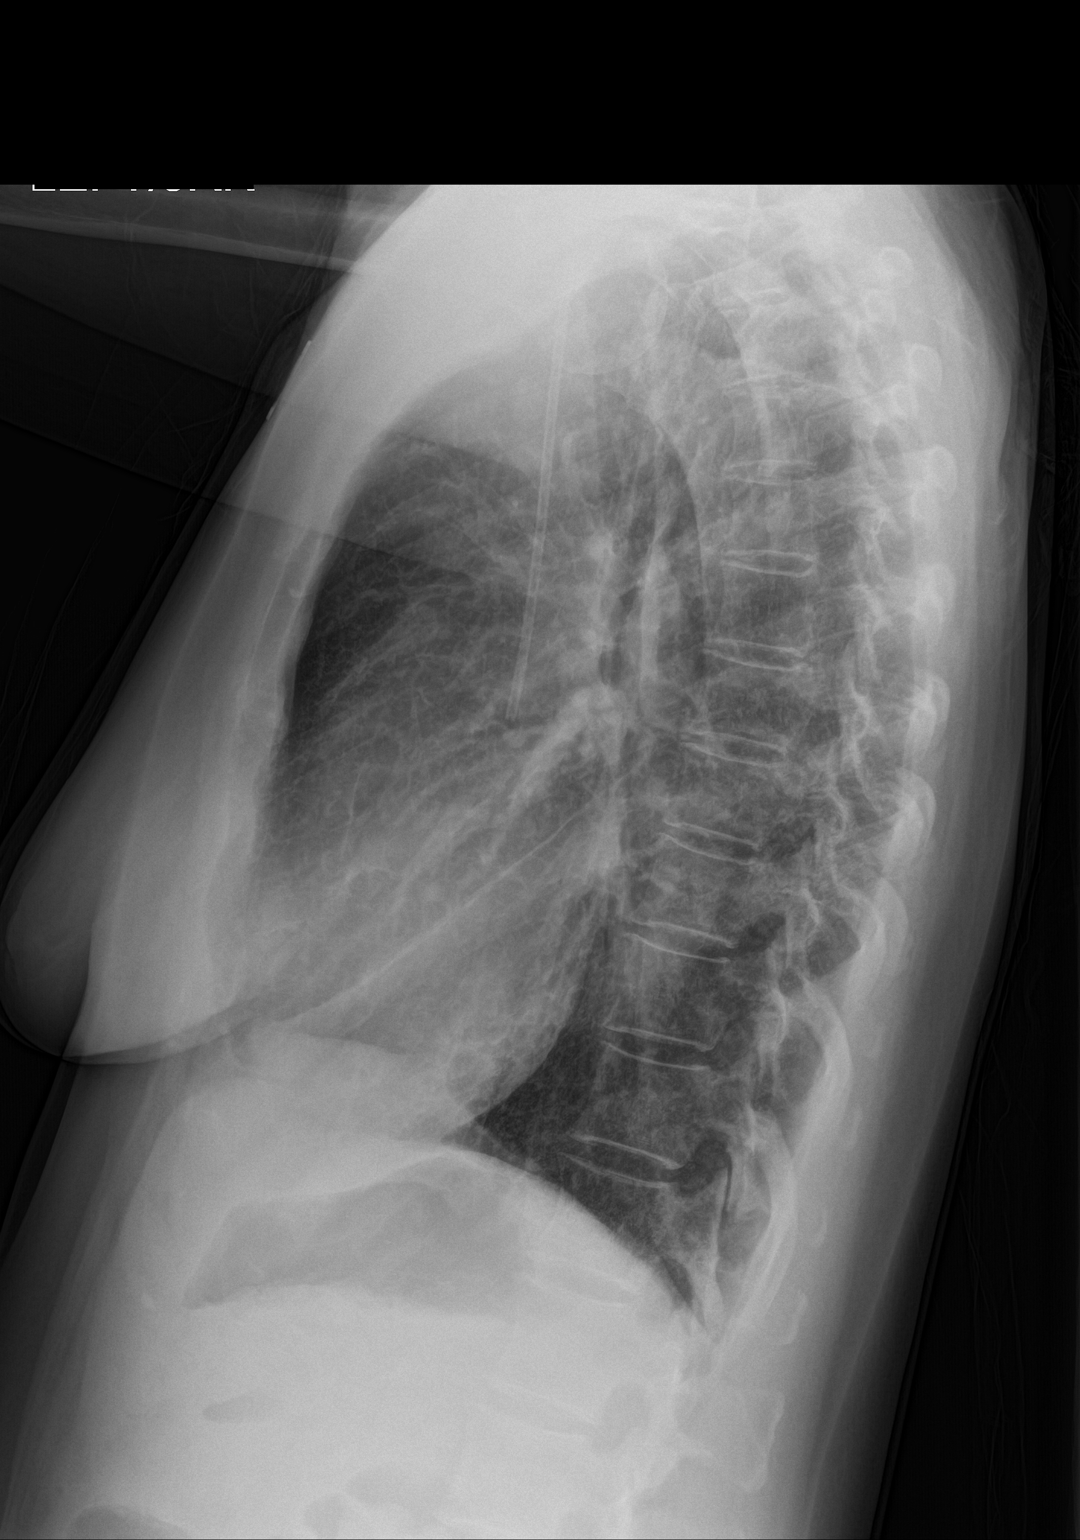

[2 of 2 positions shown; findings below may reference images not displayed]

FINDINGS: Two views of the chest demonstrate diffuse interstitial reticular
nodular prominence predominantly involving the right mid to lower
lung field with areas of hazy densities which may represent
pneumonia or worsening neoplastic process. A focal area of increased
density is noted in the right hilar region corresponding to the
density seen on the CT. There is no focal consolidation, pleural
effusion, pneumothorax. The cardiac silhouette is within normal
limits. Right pectoral Port-A-Cath with tip at the cavoatrial
junction. No acute osseous pathology.
IMPRESSION: Right mid to lower lung field interstitial reticular densities and
hazy opacities which may represent superimposed pneumonia or
progression of underlying neoplastic process. Overall there has been
interval increase in the pulmonary densities compared to chest
radiograph dated 01/18/2015.

## 2016-10-27 ENCOUNTER — Other Ambulatory Visit: Payer: Self-pay | Admitting: Internal Medicine

## 2016-10-27 DIAGNOSIS — C3431 Malignant neoplasm of lower lobe, right bronchus or lung: Secondary | ICD-10-CM

## 2016-10-27 DIAGNOSIS — G893 Neoplasm related pain (acute) (chronic): Secondary | ICD-10-CM

## 2016-10-27 MED ORDER — OXYCODONE HCL 10 MG PO TABS: 10.0000 mg | ORAL_TABLET | Freq: Four times a day (QID) | ORAL | 0 refills | Status: DC

## 2016-11-10 ENCOUNTER — Inpatient Hospital Stay: Payer: BLUE CROSS/BLUE SHIELD

## 2016-11-10 ENCOUNTER — Inpatient Hospital Stay: Payer: BLUE CROSS/BLUE SHIELD | Attending: Internal Medicine

## 2016-11-10 ENCOUNTER — Inpatient Hospital Stay (HOSPITAL_BASED_OUTPATIENT_CLINIC_OR_DEPARTMENT_OTHER): Payer: BLUE CROSS/BLUE SHIELD | Admitting: Internal Medicine

## 2016-11-10 VITALS — BP 128/84 | HR 64 | Resp 18

## 2016-11-10 VITALS — BP 130/87 | HR 65 | Temp 97.4°F | Resp 14 | Wt 127.2 lb

## 2016-11-10 DIAGNOSIS — Z5112 Encounter for antineoplastic immunotherapy: Secondary | ICD-10-CM | POA: Insufficient documentation

## 2016-11-10 DIAGNOSIS — C7931 Secondary malignant neoplasm of brain: Secondary | ICD-10-CM

## 2016-11-10 DIAGNOSIS — Z8701 Personal history of pneumonia (recurrent): Secondary | ICD-10-CM | POA: Insufficient documentation

## 2016-11-10 DIAGNOSIS — Z8042 Family history of malignant neoplasm of prostate: Secondary | ICD-10-CM | POA: Insufficient documentation

## 2016-11-10 DIAGNOSIS — M79605 Pain in left leg: Secondary | ICD-10-CM | POA: Diagnosis not present

## 2016-11-10 DIAGNOSIS — C3431 Malignant neoplasm of lower lobe, right bronchus or lung: Secondary | ICD-10-CM

## 2016-11-10 DIAGNOSIS — Z79899 Other long term (current) drug therapy: Secondary | ICD-10-CM | POA: Diagnosis not present

## 2016-11-10 DIAGNOSIS — F419 Anxiety disorder, unspecified: Secondary | ICD-10-CM | POA: Insufficient documentation

## 2016-11-10 DIAGNOSIS — T380X5A Adverse effect of glucocorticoids and synthetic analogues, initial encounter: Secondary | ICD-10-CM

## 2016-11-10 DIAGNOSIS — Z9221 Personal history of antineoplastic chemotherapy: Secondary | ICD-10-CM

## 2016-11-10 DIAGNOSIS — Z87891 Personal history of nicotine dependence: Secondary | ICD-10-CM

## 2016-11-10 DIAGNOSIS — F329 Major depressive disorder, single episode, unspecified: Secondary | ICD-10-CM

## 2016-11-10 DIAGNOSIS — M79604 Pain in right leg: Secondary | ICD-10-CM | POA: Insufficient documentation

## 2016-11-10 DIAGNOSIS — G720 Drug-induced myopathy: Secondary | ICD-10-CM

## 2016-11-10 DIAGNOSIS — R531 Weakness: Secondary | ICD-10-CM | POA: Insufficient documentation

## 2016-11-10 DIAGNOSIS — R5383 Other fatigue: Secondary | ICD-10-CM | POA: Insufficient documentation

## 2016-11-10 DIAGNOSIS — Z923 Personal history of irradiation: Secondary | ICD-10-CM

## 2016-11-10 LAB — URINALYSIS, COMPLETE (UACMP) WITH MICROSCOPIC
Bilirubin Urine: NEGATIVE
GLUCOSE, UA: NEGATIVE mg/dL
Ketones, ur: 5 mg/dL — AB
NITRITE: NEGATIVE
Protein, ur: 30 mg/dL — AB
SPECIFIC GRAVITY, URINE: 1.027 (ref 1.005–1.030)
pH: 5 (ref 5.0–8.0)

## 2016-11-10 LAB — CBC WITH DIFFERENTIAL/PLATELET
BASOS PCT: 1 %
Basophils Absolute: 0 10*3/uL (ref 0–0.1)
EOS ABS: 0.1 10*3/uL (ref 0–0.7)
EOS PCT: 2 %
HCT: 33.6 % — ABNORMAL LOW (ref 35.0–47.0)
HEMOGLOBIN: 11.6 g/dL — AB (ref 12.0–16.0)
Lymphocytes Relative: 19 %
Lymphs Abs: 1.1 10*3/uL (ref 1.0–3.6)
MCH: 38 pg — ABNORMAL HIGH (ref 26.0–34.0)
MCHC: 34.7 g/dL (ref 32.0–36.0)
MCV: 109.6 fL — ABNORMAL HIGH (ref 80.0–100.0)
MONO ABS: 0.5 10*3/uL (ref 0.2–0.9)
MONOS PCT: 9 %
NEUTROS PCT: 69 %
Neutro Abs: 3.9 10*3/uL (ref 1.4–6.5)
PLATELETS: 227 10*3/uL (ref 150–440)
RBC: 3.06 MIL/uL — ABNORMAL LOW (ref 3.80–5.20)
RDW: 18.9 % — AB (ref 11.5–14.5)
WBC: 5.6 10*3/uL (ref 3.6–11.0)

## 2016-11-10 LAB — COMPREHENSIVE METABOLIC PANEL
ALK PHOS: 55 U/L (ref 38–126)
ALT: 10 U/L — AB (ref 14–54)
AST: 23 U/L (ref 15–41)
Albumin: 4.2 g/dL (ref 3.5–5.0)
Anion gap: 11 (ref 5–15)
BUN: 12 mg/dL (ref 6–20)
CALCIUM: 9.8 mg/dL (ref 8.9–10.3)
CO2: 23 mmol/L (ref 22–32)
CREATININE: 0.7 mg/dL (ref 0.44–1.00)
Chloride: 104 mmol/L (ref 101–111)
GFR calc non Af Amer: >60 mL/min (ref >60)
Glucose, Bld: 115 mg/dL — ABNORMAL HIGH (ref 65–99)
Potassium: 3.5 mmol/L (ref 3.5–5.1)
SODIUM: 138 mmol/L (ref 135–145)
Total Bilirubin: 0.6 mg/dL (ref 0.3–1.2)
Total Protein: 7.6 g/dL (ref 6.5–8.1)

## 2016-11-10 MED ORDER — HEPARIN SOD (PORK) LOCK FLUSH 100 UNIT/ML IV SOLN
500.0000 [IU] | Freq: Once | INTRAVENOUS | Status: AC | PRN
  Administered 2016-11-10: 500 [IU]
  Filled 2016-11-10: qty 5

## 2016-11-10 MED ORDER — SODIUM CHLORIDE 0.9 % IV SOLN
900.0000 mg | Freq: Once | INTRAVENOUS | Status: AC
  Administered 2016-11-10: 900 mg via INTRAVENOUS
  Filled 2016-11-10: qty 32

## 2016-11-10 MED ORDER — SODIUM CHLORIDE 0.9 % IV SOLN
Freq: Once | INTRAVENOUS | Status: AC
  Administered 2016-11-10: 11:00:00 via INTRAVENOUS
  Filled 2016-11-10: qty 1000

## 2016-11-10 NOTE — Progress Notes (Signed)
[place] OFFICE PROGRESS NOTE  Patient Care Team: Mar Daring, PA-C as PCP - General (Family Medicine) Allyne Gee, MD as Referring Physician (Internal Medicine)  Malignant neoplasm of trachea, bronchus, and lung [facility])   Staging form: Lung, AJCC 7th Edition     Clinical: Stage IIIB (T4, N2, M0) - Signed by Cammie Sickle, MD on 06/24/2015    Oncology History   # FEB 2017-[place] Right Lower Lung T4N2M0- STAGE IIIB [Bronch & Subcarinal LNBx]- March 8th START CARBO-ALIMTA x4 cycles- June 8th 2017 - IMPROVED FDG MULTI-FOCAL lesions/N2-LN activity. S/p [place]-- Alimta x6  # AUG 18th- [place]- Avastin maintenance; SEP 13th DISCONTINUE AVASTIN [sec to cavitary lesion]; OCT 20th 2017- CT-STABLE Disease; NOV 8th CT/PET [facility] clinic]- "overall slight progression-? Lymphangiectatic spread"  # April 25th 2018- Carbo- Taxol x3 ccyles; June 2018- CT chest STABLE Disease;July 25th- Add Avastin to [place] taxol x6 cycles- Sep 2018-CT scan- [facility] STABLE   # Sep 2018- Avastin Maintence  # NOV 2nd 2017-MRI, [facility]- Brain mets [asymptomatic;Right frontal ~29m; ~323mlesions -appx 3-4 lesions; (right Frontal & Left parietal s/p GK x 2 lesions] ; June 2018- Progession vs radiation necrosis [July 2th-Add Avastin]  # [facility] 4 small subcentimeter brain lesions for which she had the Gamma knife. The main right frontal lesion gotten smaller; improve edema.   --------------------------------------------------------------     MOLECULAR STUDIES:  KRAS MUTATED/Tissues not sufficient for OTHER the molecular marker; will need repeat Bx ; GUARDIANT TESTING [[facility]]- pending. # [phone][facility]]    [place]    INTERVAL HISTORY:  [name]89.o.  female pleasant patient above history of Recurrent Metastatic adenocarcinoma the lung; And brain metastases- Currently on  Avastin maintenance is here for follow-up.  Patient denies any worsening headaches. Appetite is good. No weight loss. She unfortunately continues to have tingling and numbness of her fingertips and toes. Poor tolerance to Neurontin.  Her energy levels are better. She denies any headaches. Denies any worsening weakness in her legs or arms.   REVIEW OF SYSTEMS:  A complete 10 point review of system is done which is negative except mentioned above/history of present illness.   PAST MEDICAL HISTORY :  Past Medical History:  Diagnosis Date  . Anxiety   . [place] of right [place]  . Depression   . Depression with anxiety    Well controlled with Wellbutrin and Buspar  . Pneumonia     PAST SURGICAL HISTORY :   Past Surgical History:  Procedure Laterality Date  . ECTOPIC PREGNANCY SURGERY  1988  . ELECTROMAGNETIC NAVIGATION BROCHOSCOPY N/A 02/10/2015   Procedure: ELECTROMAGNETIC NAVIGATION BRONCHOSCOPY;  Surgeon: KuFlora LippsMD;  Location: [facility] ORS;  Service: Cardiopulmonary;  Laterality: N/A;  . ENDOBRONCHIAL ULTRASOUND N/A 02/10/2015   Procedure: ENDOBRONCHIAL ULTRASOUND;  Surgeon: KuFlora LippsMD;  Location: [facility] ORS;  Service: Cardiopulmonary;  Laterality: N/A;  . PERIPHERAL VASCULAR CATHETERIZATION N/A 03/05/2015   Procedure: PoGlori Luisath Insertion;  Surgeon: GrKatha CabalMD;  [facility];  Service: Cardiovascular;  Laterality: N/A;    FAMILY HISTORY :   Family History  Problem Relation Age of Onset  . Cancer Father        Prostate Cancer  . Hypertension Father   . Stroke Father   . Hypertension Brother     SOCIAL HISTORY:   Social History  Tobacco Use  . Smoking status: Former Smoker    Packs/day: 0.25    Years: 30.00    Pack years: 7.50    Types: Cigarettes    Last attempt to quit: 02/06/2005    Years since quitting: 11.8  . Smokeless tobacco: Never Used  Substance Use Topics  . Alcohol use: Yes    Alcohol/week: 3.0 oz    Types: 5 Cans  of beer per week    Comment: 5 beers weekly  . Drug use: No    ALLERGIES:  has No Known Allergies.  MEDICATIONS:  Current Outpatient Medications  Medication Sig Dispense Refill  . acetaminophen (TYLENOL) 500 MG tablet Take 1,000 mg by mouth every 4 (four) hours as needed.     . busPIRone (BUSPAR) 5 MG tablet Take 1 tablet (5 mg total) by mouth 2 (two) times daily. 60 tablet 6  . cyclobenzaprine (FLEXERIL) 5 MG tablet Take 5 mg by mouth 3 (three) times daily as needed for muscle spasms.    [name] Kitchen docusate sodium (COLACE) 100 MG capsule Take 100 mg by mouth 2 (two) times daily.    [name] Kitchen gabapentin (NEURONTIN) 100 MG capsule Take 2 capsules (200 mg total) by mouth 3 (three) times daily. 180 capsule 3  . lidocaine-prilocaine (EMLA) cream Apply 1 application topically as needed. 30 g 2  . ondansetron (ZOFRAN) 8 MG tablet Take 1 tablet (8 mg total) by mouth every 8 (eight) hours as needed for nausea or vomiting (start 3 days; after chemo). 40 tablet 1  . prochlorperazine (COMPAZINE) 10 MG tablet Take 1 tablet (10 mg total) by mouth every 6 (six) hours as needed for nausea or vomiting. 25 tablet 1  . SPIRIVA RESPIMAT 1.25 MCG/ACT AERS Inhale 1 puff into the lungs daily.     . SYMBICORT 80-4.5 MCG/ACT inhaler Inhale 1 puff into the lungs every 12 (twelve) hours.     [name] Kitchen zolpidem (AMBIEN) 5 MG tablet Take 1 tablet (5 mg total) by mouth at bedtime as needed. for sleep 30 tablet 2  . Oxycodone HCl 10 MG TABS Take 1 tablet (10 mg total) by mouth every 6 (six) hours. 60 tablet 0   No current facility-administered medications for this visit.     PHYSICAL EXAMINATION: ECOG PERFORMANCE STATUS: 0 - Asymptomatic  BP 130/87 (BP Location: Left Arm, Patient Position: Sitting)   Pulse 65   Temp (!) 97.4 F (36.3 C) (Tympanic)   Resp 14   Wt 127 lb 3.2 oz (57.7 kg)   LMP 06/16/2000 (Approximate) Comment: 15 yrs ago  BMI 23.27 kg/m   Filed Weights   11/10/16 0931  Weight: 127 lb 3.2 oz (57.7 kg)    GENERAL:  Well-nourished well-developed; Alert, no distress and comfortable. She is accompanied by her husband.  EYES: no pallor or icterus OROPHARYNX: no thrush or ulceration; good dentition  NECK: supple, no masses felt LYMPH:  no palpable lymphadenopathy in the cervical, axillary or inguinal regions LUNGS: clear to auscultation and  No wheeze or crackles HEART/CVS: regular rate & rhythm and no murmurs; No lower extremity edema ABDOMEN:abdomen soft, non-tender and normal bowel sounds Musculoskeletal:no cyanosis of digits and no clubbing  PSYCH: alert & oriented x 3 with fluent speech.  NEURO: no focal motor/sensory deficits SKIN:  no rashes or significant lesions  LABORATORY DATA:  I have reviewed the data as listed    Component Value Date/Time   NA 138 11/10/2016 0910   NA 139 01/03/2015 0910   K 3.5 11/10/2016  0910   CL 104 11/10/2016 0910   CO2 23 11/10/2016 0910   GLUCOSE 115 (H) 11/10/2016 0910   BUN 12 11/10/2016 0910   BUN 10 01/03/2015 0910   CREATININE 0.70 11/10/2016 0910   CALCIUM 9.8 11/10/2016 0910   PROT 7.6 11/10/2016 0910   PROT 6.5 01/03/2015 0910   ALBUMIN 4.2 11/10/2016 0910   ALBUMIN 3.8 01/03/2015 0910   AST 23 11/10/2016 0910   ALT 10 (L) 11/10/2016 0910   ALKPHOS 55 11/10/2016 0910   BILITOT 0.6 11/10/2016 0910   BILITOT <0.2 01/03/2015 0910   GFRNONAA >60 11/10/2016 0910   GFRAA >60 11/10/2016 0910    No results found for: SPEP, UPEP  Lab Results  Component Value Date   WBC 5.6 11/10/2016   NEUTROABS 3.9 11/10/2016   HGB 11.6 (L) 11/10/2016   HCT 33.6 (L) 11/10/2016   MCV 109.6 (H) 11/10/2016   PLT 227 11/10/2016      Chemistry      Component Value Date/Time   NA 138 11/10/2016 0910   NA 139 01/03/2015 0910   K 3.5 11/10/2016 0910   CL 104 11/10/2016 0910   CO2 23 11/10/2016 0910   BUN 12 11/10/2016 0910   BUN 10 01/03/2015 0910   CREATININE 0.70 11/10/2016 0910      Component Value Date/Time   CALCIUM 9.8 11/10/2016 0910   ALKPHOS 55  11/10/2016 0910   AST 23 11/10/2016 0910   ALT 10 (L) 11/10/2016 0910   BILITOT 0.6 11/10/2016 0910   BILITOT <0.2 01/03/2015 0910     09/14/2016 [compared to June 2018]  FINDINGS: There is a semisolid mass with underlying cystic changes and cavitation in the superior segment of the right lower lobe measuring slightly greater than 6 cm in size. This has not changed significantly since the prior outside examination. Peripheral groundglass and airspace infiltrates in the right lower lobe inferiorly are stable as are the groundglass infiltrates in the inferior right upper lobe, some of which contain cystic changes. Mild interlobular septal thickening in the right upper lobe persists.  2 mm lingular nodule (2/157) is unchanged. Right perihilar and lower lobe bronchial wall thickening is again noted. Right IJ Port-A-Cath tip in the upper RA. Old right rib fracture. Mild emphysematous changes in the upper lungs. No thoracic adenopathy by size criteria. ---------------------------------------------------------- 09/14/2016- IMPRESSION:  No CT evidence of metastatic disease in the abdomen and pelvis.   RADIOGRAPHIC STUDIES: I have personally reviewed the radiological images as listed and agreed with the findings in the report. No results found.   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 SEP 11th CT Chest [facility] clinic] STABLE disease; MRI Brain- stable/improved right frontal lesion ; however up to 4 new subcentimeter lesions.  # continue maintenance Avastin . Patient tolerating treatment fairly well; will repeat scans in dec 2018; will order scans at next visit.   # Symptomatic brain metastases- right frontal ~2 cm/vasogenic edema status post gamma knife [Nov 2017]; subcentimeter S/p GK in [facility] clinic Sep 2018 "4 more lesions". Symptomatic improvement noted.  # Fatigue- sec to Avastin; recommend  exercise/ check TSH.   # PN-G-1.  Sec to taxol; TID-improving.   # Fleeting body aches/pain- NSAIDs/ improved.   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 Off steroids now.   # follow up with me in 3 weeks/labs-UA/avastin alone. Check TSH.   Orders Placed This  Encounter  Procedures  . CBC with Differential/Platelet    Standing Status:   Future    Standing Expiration Date:   11/10/2017  . Comprehensive metabolic panel    Standing Status:   Future    Standing Expiration Date:   11/10/2017  . Urinalysis, Complete w Microscopic    Standing Status:   Future    Number of Occurrences:   1    Standing Expiration Date:   02/10/2017     Cammie Sickle, MD 11/30/2016 8:29 PM

## 2016-11-10 NOTE — Assessment & Plan Note (Addendum)
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 SEP 11th CT Chest [facility] clinic] STABLE disease; MRI Brain- stable/improved right frontal lesion ; however up to 4 new subcentimeter lesions.  # continue maintenance Avastin . Patient tolerating treatment fairly well; will repeat scans in dec 2018; will order scans at next visit.   # Symptomatic brain metastases- right frontal ~2 cm/vasogenic edema status post gamma knife [Nov 2017]; subcentimeter S/p GK in [facility] clinic Sep 2018 "4 more lesions". Symptomatic improvement noted.  # Fatigue- sec to Avastin; recommend exercise/ check TSH.   # PN-G-1.  Sec to taxol; TID-improving.   # Fleeting body aches/pain- NSAIDs/ improved.   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 Off steroids now.   # follow up with me in 3 weeks/labs-UA/avastin alone. Check TSH.

## 2016-11-11 ENCOUNTER — Other Ambulatory Visit: Payer: BLUE CROSS/BLUE SHIELD

## 2016-11-11 ENCOUNTER — Ambulatory Visit: Payer: BLUE CROSS/BLUE SHIELD | Admitting: Internal Medicine

## 2016-11-11 ENCOUNTER — Ambulatory Visit: Payer: BLUE CROSS/BLUE SHIELD

## 2016-11-11 ENCOUNTER — Other Ambulatory Visit: Payer: Self-pay | Admitting: *Deleted

## 2016-11-11 DIAGNOSIS — C3431 Malignant neoplasm of lower lobe, right bronchus or lung: Secondary | ICD-10-CM

## 2016-11-18 ENCOUNTER — Ambulatory Visit (INDEPENDENT_AMBULATORY_CARE_PROVIDER_SITE_OTHER): Payer: BLUE CROSS/BLUE SHIELD | Admitting: Physician Assistant

## 2016-11-18 ENCOUNTER — Encounter: Payer: Self-pay | Admitting: Physician Assistant

## 2016-11-18 VITALS — BP 130/90 | HR 72 | Temp 97.7°F | Resp 16 | Wt 129.0 lb

## 2016-11-18 DIAGNOSIS — L821 Other seborrheic keratosis: Secondary | ICD-10-CM | POA: Insufficient documentation

## 2016-11-18 NOTE — Patient Instructions (Signed)
Seborrheic Keratosis Seborrheic keratosis is a common, noncancerous (benign) skin growth. This condition causes waxy, rough, tan, brown, or black spots to appear on the skin. These skin growths can be flat or raised. What are the causes? The cause of this condition is not known. What increases the risk? This condition is more likely to develop in:  People who have a family history of seborrheic keratosis.  People who are 45 or older.  People who are pregnant.  People who have had estrogen replacement therapy.  What are the signs or symptoms? This condition often occurs on the face, chest, shoulders, back, or other areas. These growths:  Are usually painless, but may become irritated and itchy.  Can be yellow, brown, black, or other colors.  Are slightly raised or have a flat surface.  Are sometimes rough or wart-like in texture.  Are often waxy on the surface.  Are round or oval-shaped.  Sometimes look like they are "stuck on."  Often occur in groups, but may occur as a single growth.  How is this diagnosed? This condition is diagnosed with a medical history and physical exam. A sample of the growth may be tested (skin biopsy). You may need to see a skin specialist (dermatologist). How is this treated? Treatment is not usually needed for this condition, unless the growths are irritated or are often bleeding. You may also choose to have the growths removed if you do not like their appearance. Most commonly, these growths are treated with a procedure in which liquid nitrogen is applied to "freeze" off the growth (cryosurgery). They may also be burned off with electricity or cut off. Follow these instructions at home:  Watch your growth for any changes.  Keep all follow-up visits as told by your health care provider. This is important.  Do not scratch or pick at the growth or growths. This can cause them to become irritated or infected. Contact a health care provider  if:  You suddenly have many new growths.  Your growth bleeds, itches, or hurts.  Your growth suddenly becomes larger or changes color. This information is not intended to replace advice given to you by your health care provider. Make sure you discuss any questions you have with your health care provider. Document Released: 01/23/2010 Document Revised: 05/29/2015 Document Reviewed: 05/08/2014 Elsevier Interactive Patient Education  2017 [employer].

## 2016-11-18 NOTE — Progress Notes (Signed)
Patient: Deanna Blair Female    DOB: March 24, 1955   61 y.o.   MRN: [id number] Visit Date: 11/18/2016  Today's Provider: Mar Daring, PA-C   Chief Complaint  Patient presents with  . Rash   Subjective:    HPI Patient here today C/O lesion on scalp times several years. Patient denies any pain, reports itching and growing in size. Patient reports not using any over the counter medications.     No Known Allergies   Current Outpatient Medications:  .  acetaminophen (TYLENOL) 500 MG tablet, Take 1,000 mg by mouth every 4 (four) hours as needed. , Disp: , Rfl:  .  busPIRone (BUSPAR) 5 MG tablet, Take 1 tablet (5 mg total) by mouth 2 (two) times daily., Disp: 60 tablet, Rfl: 6 .  cyclobenzaprine (FLEXERIL) 5 MG tablet, Take 5 mg by mouth 3 (three) times daily as needed for muscle spasms., Disp: , Rfl:  .  docusate sodium (COLACE) 100 MG capsule, Take 100 mg by mouth 2 (two) times daily., Disp: , Rfl:  .  gabapentin (NEURONTIN) 100 MG capsule, Take 2 capsules (200 mg total) by mouth 3 (three) times daily., Disp: 180 capsule, Rfl: 3 .  lidocaine-prilocaine (EMLA) cream, Apply 1 application topically as needed., Disp: 30 g, Rfl: 2 .  ondansetron (ZOFRAN) 8 MG tablet, Take 1 tablet (8 mg total) by mouth every 8 (eight) hours as needed for nausea or vomiting (start 3 days; after chemo)., Disp: 40 tablet, Rfl: 1 .  Oxycodone HCl 10 MG TABS, Take 1 tablet (10 mg total) by mouth every 6 (six) hours., Disp: 60 tablet, Rfl: 0 .  prochlorperazine (COMPAZINE) 10 MG tablet, Take 1 tablet (10 mg total) by mouth every 6 (six) hours as needed for nausea or vomiting., Disp: 25 tablet, Rfl: 1 .  SPIRIVA RESPIMAT 1.25 MCG/ACT AERS, Inhale 1 puff into the lungs daily. , Disp: , Rfl:  .  SYMBICORT 80-4.5 MCG/ACT inhaler, Inhale 1 puff into the lungs every 12 (twelve) hours. , Disp: , Rfl:  .  zolpidem (AMBIEN) 5 MG tablet, Take 1 tablet (5 mg total) by mouth at bedtime as needed. for sleep,  Disp: 30 tablet, Rfl: 2  Review of Systems  Constitutional: Negative.   HENT: Negative.   Respiratory: Negative.   Cardiovascular: Negative.   Skin:       Lesion on scalp    Social History   Tobacco Use  . Smoking status: Former Smoker    Packs/day: 0.25    Years: 30.00    Pack years: 7.50    Types: Cigarettes    Last attempt to quit: 02/06/2005    Years since quitting: 11.7  . Smokeless tobacco: Never Used  Substance Use Topics  . Alcohol use: Yes    Alcohol/week: 3.0 oz    Types: 5 Cans of beer per week    Comment: 5 beers weekly   Objective:   BP 130/90 (BP Location: Left Arm, Patient Position: Sitting, Cuff Size: Small)   Pulse 72   Temp 97.7 F (36.5 C) (Oral)   Resp 16   Wt 129 lb (58.5 kg)   LMP 06/16/2000 (Approximate) Comment: 15 yrs ago  SpO2 97%   BMI 23.59 kg/m  Vitals:   11/18/16 1106  BP: 130/90  Pulse: 72  Resp: 16  Temp: 97.7 F (36.5 C)  TempSrc: Oral  SpO2: 97%  Weight: 129 lb (58.5 kg)     Physical Exam  Constitutional:  She appears well-developed and well-nourished. No distress.  Neck: Normal range of motion. Neck supple.  Cardiovascular: Normal rate, regular rhythm and normal heart sounds. Exam reveals no gallop and no friction rub.  No murmur heard. Pulmonary/Chest: Effort normal and breath sounds normal. No respiratory distress. She has no wheezes. She has no rales.  Skin: She is not diaphoretic.     Vitals reviewed.       Assessment & Plan:     1. Seborrheic keratosis Patient reports her father had similar lesions that had to be removed. She is interested in removal of this lesion as well. Will place referral to [facility] on [address] here in town for removal. Patient does have lung cancer with brain mets under maintenance therapy at this time. Doing well.  - Ambulatory referral to Dermatology       Mar Daring, PA-C  [facility]

## 2016-11-24 ENCOUNTER — Other Ambulatory Visit: Payer: Self-pay | Admitting: Oncology

## 2016-11-24 DIAGNOSIS — G893 Neoplasm related pain (acute) (chronic): Secondary | ICD-10-CM

## 2016-11-24 DIAGNOSIS — C3431 Malignant neoplasm of lower lobe, right bronchus or lung: Secondary | ICD-10-CM

## 2016-11-24 MED ORDER — OXYCODONE HCL 10 MG PO TABS: 10.0000 mg | ORAL_TABLET | Freq: Four times a day (QID) | ORAL | 0 refills | Status: DC

## 2016-12-01 ENCOUNTER — Inpatient Hospital Stay (HOSPITAL_BASED_OUTPATIENT_CLINIC_OR_DEPARTMENT_OTHER): Payer: BLUE CROSS/BLUE SHIELD | Admitting: Internal Medicine

## 2016-12-01 ENCOUNTER — Inpatient Hospital Stay: Payer: BLUE CROSS/BLUE SHIELD

## 2016-12-01 ENCOUNTER — Other Ambulatory Visit: Payer: Self-pay

## 2016-12-01 ENCOUNTER — Ambulatory Visit
Admission: RE | Admit: 2016-12-01 | Discharge: 2016-12-01 | Disposition: A | Discharge status: Home / Self Care | Payer: BLUE CROSS/BLUE SHIELD | Source: Ambulatory Visit | Attending: Internal Medicine | Admitting: Internal Medicine

## 2016-12-01 VITALS — BP 137/82 | HR 70 | Temp 98.0°F | Resp 20 | Ht 62.0 in | Wt 129.0 lb

## 2016-12-01 DIAGNOSIS — F329 Major depressive disorder, single episode, unspecified: Secondary | ICD-10-CM | POA: Diagnosis not present

## 2016-12-01 DIAGNOSIS — M79604 Pain in right leg: Secondary | ICD-10-CM | POA: Insufficient documentation

## 2016-12-01 DIAGNOSIS — C3431 Malignant neoplasm of lower lobe, right bronchus or lung: Secondary | ICD-10-CM | POA: Diagnosis not present

## 2016-12-01 DIAGNOSIS — M79605 Pain in left leg: Secondary | ICD-10-CM | POA: Insufficient documentation

## 2016-12-01 DIAGNOSIS — Z87891 Personal history of nicotine dependence: Secondary | ICD-10-CM

## 2016-12-01 DIAGNOSIS — Z923 Personal history of irradiation: Secondary | ICD-10-CM

## 2016-12-01 DIAGNOSIS — Z79899 Other long term (current) drug therapy: Secondary | ICD-10-CM | POA: Diagnosis not present

## 2016-12-01 DIAGNOSIS — Z9221 Personal history of antineoplastic chemotherapy: Secondary | ICD-10-CM

## 2016-12-01 DIAGNOSIS — C7931 Secondary malignant neoplasm of brain: Secondary | ICD-10-CM

## 2016-12-01 DIAGNOSIS — F419 Anxiety disorder, unspecified: Secondary | ICD-10-CM | POA: Diagnosis not present

## 2016-12-01 LAB — URINALYSIS, COMPLETE (UACMP) WITH MICROSCOPIC
BACTERIA UA: NONE SEEN
Bilirubin Urine: NEGATIVE
Glucose, UA: NEGATIVE mg/dL
Hgb urine dipstick: NEGATIVE
KETONES UR: NEGATIVE mg/dL
Leukocytes, UA: NEGATIVE
Nitrite: NEGATIVE
PROTEIN: 30 mg/dL — AB
Specific Gravity, Urine: 1.021 (ref 1.005–1.030)
pH: 5 (ref 5.0–8.0)

## 2016-12-01 LAB — COMPREHENSIVE METABOLIC PANEL
ALK PHOS: 49 U/L (ref 38–126)
ALT: 10 U/L — ABNORMAL LOW (ref 14–54)
ANION GAP: 10 (ref 5–15)
AST: 24 U/L (ref 15–41)
Albumin: 4.3 g/dL (ref 3.5–5.0)
BILIRUBIN TOTAL: 0.7 mg/dL (ref 0.3–1.2)
BUN: 17 mg/dL (ref 6–20)
CALCIUM: 9.6 mg/dL (ref 8.9–10.3)
CO2: 24 mmol/L (ref 22–32)
Chloride: 104 mmol/L (ref 101–111)
Creatinine, Ser: 0.91 mg/dL (ref 0.44–1.00)
GFR calc non Af Amer: >60 mL/min (ref >60)
Glucose, Bld: 102 mg/dL — ABNORMAL HIGH (ref 65–99)
POTASSIUM: 4.1 mmol/L (ref 3.5–5.1)
Sodium: 138 mmol/L (ref 135–145)
TOTAL PROTEIN: 7.3 g/dL (ref 6.5–8.1)

## 2016-12-01 LAB — CBC WITH DIFFERENTIAL/PLATELET
BASOS ABS: 0 10*3/uL (ref 0–0.1)
BASOS PCT: 1 %
Eosinophils Absolute: 0.1 10*3/uL (ref 0–0.7)
Eosinophils Relative: 3 %
HEMATOCRIT: 35.7 % (ref 35.0–47.0)
HEMOGLOBIN: 12.3 g/dL (ref 12.0–16.0)
Lymphocytes Relative: 23 %
Lymphs Abs: 1.2 10*3/uL (ref 1.0–3.6)
MCH: 37.8 pg — ABNORMAL HIGH (ref 26.0–34.0)
MCHC: 34.6 g/dL (ref 32.0–36.0)
MCV: 109.4 fL — ABNORMAL HIGH (ref 80.0–100.0)
Monocytes Absolute: 0.4 10*3/uL (ref 0.2–0.9)
Monocytes Relative: 8 %
NEUTROS ABS: 3.6 10*3/uL (ref 1.4–6.5)
Neutrophils Relative %: 65 %
Platelets: 215 10*3/uL (ref 150–440)
RBC: 3.26 MIL/uL — AB (ref 3.80–5.20)
RDW: 15.2 % — ABNORMAL HIGH (ref 11.5–14.5)
WBC: 5.4 10*3/uL (ref 3.6–11.0)

## 2016-12-01 MED ORDER — SODIUM CHLORIDE 0.9 % IV SOLN
900.0000 mg | Freq: Once | INTRAVENOUS | Status: AC
  Administered 2016-12-01: 900 mg via INTRAVENOUS
  Filled 2016-12-01: qty 32

## 2016-12-01 MED ORDER — HEPARIN SOD (PORK) LOCK FLUSH 100 UNIT/ML IV SOLN
500.0000 [IU] | Freq: Once | INTRAVENOUS | Status: AC
  Administered 2016-12-01: 500 [IU] via INTRAVENOUS
  Filled 2016-12-01: qty 5

## 2016-12-01 MED ORDER — SODIUM CHLORIDE 0.9% FLUSH
10.0000 mL | INTRAVENOUS | Status: DC | PRN
  Administered 2016-12-01: 10 mL via INTRAVENOUS
  Filled 2016-12-01: qty 10

## 2016-12-01 MED ORDER — SODIUM CHLORIDE 0.9 % IV SOLN: 15.0000 mg/kg | Freq: Once | INTRAVENOUS | Status: DC

## 2016-12-01 MED ORDER — SODIUM CHLORIDE 0.9 % IV SOLN
Freq: Once | INTRAVENOUS | Status: AC
  Administered 2016-12-01: 10:00:00 via INTRAVENOUS
  Filled 2016-12-01: qty 1000

## 2016-12-01 NOTE — Progress Notes (Signed)
Patient has an apt at the [facility] on Jan 12, 2017 with Dr. David Stall. She has no medical complaints today.

## 2016-12-01 NOTE — Progress Notes (Signed)
[facility] OFFICE PROGRESS NOTE  Patient Care Team: Mar Daring, PA-C as PCP - General (Family Medicine) Allyne Gee, MD as Referring Physician (Internal Medicine)  Malignant neoplasm of trachea, bronchus, and lung [facility])   Staging form: Lung, AJCC 7th Edition     Clinical: Stage IIIB (T4, N2, M0) - Signed by Cammie Sickle, MD on 06/24/2015    Oncology History   # FEB 2017-[place] CA Right Lower Lung T4N2M0- STAGE IIIB [Bronch & Subcarinal LNBx]- March 8th START CARBO-ALIMTA x4 cycles- June 8th 2017 - IMPROVED FDG MULTI-FOCAL lesions/N2-LN activity. S/p [place]-- Alimta x6  # AUG 18th- [place]- Avastin maintenance; SEP 13th DISCONTINUE AVASTIN [sec to cavitary lesion]; OCT 20th 2017- CT-STABLE Disease; NOV 8th CT/PET [facility] clinic]- "overall slight progression-? 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 The main right frontal lesion gotten smaller; improve edema.   --------------------------------------------------------------     MOLECULAR STUDIES:  KRAS MUTATED/Tissues not sufficient for OTHER the molecular marker; will need repeat Bx ; GUARDIANT TESTING [[facility]]- pending. [phone][facility]]    Primary cancer of right lower lobe of lung ([facility]    INTERVAL HISTORY:  WaMeilin Brosh89.o.  female pleasant patient above history of Recurrent Metastatic adenocarcinoma the lung; And brain metastases- Currently on  Avastin maintenance is here for follow-up.  Patient denies any worsening shortness of breath or cough.  Denies any chest pain.  No back pain.  No headaches.  She is currently off all steroids.  Patient complains of intermittent pain in the calves-bilaterally.  Her energy levels are better. Denies any worsening weakness in her legs or arms.  [facility]  REVIEW OF SYSTEMS:  A complete 10 point review of system is done which is negative except mentioned above/history of present illness.   PAST MEDICAL HISTORY :  Past Medical History:  Diagnosis Date  . Anxiety   . Cancer of right lung ([facility]/09/[place]  . Depression   . Depression with anxiety    Well controlled with Wellbutrin and Buspar  . Pneumonia     PAST SURGICAL HISTORY :   Past Surgical History:  Procedure Laterality Date  . ECTOPIC PREGNANCY SURGERY  1988  . ELECTROMAGNETIC NAVIGATION BROCHOSCOPY N/A 02/10/2015   Procedure: ELECTROMAGNETIC NAVIGATION BRONCHOSCOPY;  Surgeon: KuFlora LippsMD;  Location: [facility] ORS;  Service: Cardiopulmonary;  Laterality: N/A;  . ENDOBRONCHIAL ULTRASOUND N/A 02/10/2015   Procedure: ENDOBRONCHIAL ULTRASOUND;  Surgeon: KuFlora LippsMD;  Location: [facility] ORS;  Service: Cardiopulmonary;  Laterality: N/A;  . PERIPHERAL VASCULAR CATHETERIZATION N/A 03/05/2015   Procedure: PoGlori Luisath Insertion;  Surgeon: GrKatha CabalMD;  [facility];  Service: Cardiovascular;  Laterality: N/A;    FAMILY HISTORY :   Family History  Problem Relation Age of Onset  . Cancer Father        Prostate Cancer  . Hypertension Father   . Stroke Father   .  Hypertension Brother     SOCIAL HISTORY:   Social History   Tobacco Use  . Smoking status: Former Smoker    Packs/day: 0.25    Years: 30.00    Pack years: 7.50    Types: Cigarettes    Last attempt to quit: 02/06/2005    Years since quitting: 11.8  . Smokeless tobacco: Never Used  Substance Use  Topics  . Alcohol use: Yes    Alcohol/week: 3.0 oz    Types: 5 Cans of beer per week    Comment: 5 beers weekly  . Drug use: No    ALLERGIES:  has No Known Allergies.  MEDICATIONS:  Current Outpatient Medications  Medication Sig Dispense Refill  . acetaminophen (TYLENOL) 500 MG tablet Take 1,000 mg by mouth every 4 (four) hours as needed.     . busPIRone (BUSPAR) 5 MG tablet Take 1 tablet (5 mg total) by mouth 2 (two) times daily. 60 tablet 6  . cyclobenzaprine (FLEXERIL) 5 MG tablet Take 5 mg by mouth 3 (three) times daily as needed for muscle spasms.    [name] Kitchen docusate sodium (COLACE) 100 MG capsule Take 100 mg by mouth 2 (two) times daily.    [name] Kitchen gabapentin (NEURONTIN) 100 MG capsule Take 2 capsules (200 mg total) by mouth 3 (three) times daily. 180 capsule 3  . lidocaine-prilocaine (EMLA) cream Apply 1 application topically as needed. 30 g 2  . ondansetron (ZOFRAN) 8 MG tablet Take 1 tablet (8 mg total) by mouth every 8 (eight) hours as needed for nausea or vomiting (start 3 days; after chemo). 40 tablet 1  . Oxycodone HCl 10 MG TABS Take 1 tablet (10 mg total) by mouth every 6 (six) hours. 60 tablet 0  . prochlorperazine (COMPAZINE) 10 MG tablet Take 1 tablet (10 mg total) by mouth every 6 (six) hours as needed for nausea or vomiting. 25 tablet 1  . SPIRIVA RESPIMAT 1.25 MCG/ACT AERS Inhale 1 puff into the lungs daily.     . SYMBICORT 80-4.5 MCG/ACT inhaler Inhale 1 puff into the lungs every 12 (twelve) hours.     [name] Kitchen zolpidem (AMBIEN) 5 MG tablet Take 1 tablet (5 mg total) by mouth at bedtime as needed. for sleep 30 tablet 2   No current facility-administered medications for this visit.     PHYSICAL EXAMINATION: ECOG PERFORMANCE STATUS: 0 - Asymptomatic  BP 137/82 (BP Location: Left Arm, Patient Position: Sitting)   Pulse 70   Temp 98 F (36.7 C) (Tympanic)   Resp 20   Ht 5' 2"  (1.575 m)   Wt 129 lb (58.5 kg)   LMP 06/16/2000 (Approximate) Comment: 15 yrs ago  BMI 23.59 kg/m    Filed Weights   12/01/16 0854  Weight: 129 lb (58.5 kg)    GENERAL: Well-nourished well-developed; Alert, no distress and comfortable. She is accompanied by her husband.  EYES: no pallor or icterus OROPHARYNX: no thrush or ulceration; good dentition  NECK: supple, no masses felt LYMPH:  no palpable lymphadenopathy in the cervical, axillary or inguinal regions LUNGS: clear to auscultation and  No wheeze or crackles HEART/CVS: regular rate & rhythm and no murmurs; No lower extremity edema; tenderness bilateral calves.  No erythema. ABDOMEN:abdomen soft, non-tender and normal bowel sounds Musculoskeletal:no cyanosis of digits and no clubbing  PSYCH: alert & oriented x 3 with fluent speech.  NEURO: no focal motor/sensory deficits SKIN:  no rashes or significant lesions  LABORATORY DATA:  I have reviewed the data as listed  Component Value Date/Time   NA 138 12/01/2016 0821   NA 139 01/03/2015 0910   K 4.1 12/01/2016 0821   CL 104 12/01/2016 0821   CO2 24 12/01/2016 0821   GLUCOSE 102 (H) 12/01/2016 0821   BUN 17 12/01/2016 0821   BUN 10 01/03/2015 0910   CREATININE 0.91 12/01/2016 0821   CALCIUM 9.6 12/01/2016 0821   PROT 7.3 12/01/2016 0821   PROT 6.5 01/03/2015 0910   ALBUMIN 4.3 12/01/2016 0821   ALBUMIN 3.8 01/03/2015 0910   AST 24 12/01/2016 0821   ALT 10 (L) 12/01/2016 0821   ALKPHOS 49 12/01/2016 0821   BILITOT 0.7 12/01/2016 0821   BILITOT <0.2 01/03/2015 0910   GFRNONAA >60 12/01/2016 0821   GFRAA >60 12/01/2016 0821    No results found for: SPEP, UPEP  Lab Results  Component Value Date   WBC 5.4 12/01/2016   NEUTROABS 3.6 12/01/2016   HGB 12.3 12/01/2016   HCT 35.7 12/01/2016   MCV 109.4 (H) 12/01/2016   PLT 215 12/01/2016      Chemistry      Component Value Date/Time   NA 138 12/01/2016 0821   NA 139 01/03/2015 0910   K 4.1 12/01/2016 0821   CL 104 12/01/2016 0821   CO2 24 12/01/2016 0821   BUN 17 12/01/2016 0821   BUN 10 01/03/2015 0910    CREATININE 0.91 12/01/2016 0821      Component Value Date/Time   CALCIUM 9.6 12/01/2016 0821   ALKPHOS 49 12/01/2016 0821   AST 24 12/01/2016 0821   ALT 10 (L) 12/01/2016 0821   BILITOT 0.7 12/01/2016 0821   BILITOT <0.2 01/03/2015 0910     09/14/2016 [compared to June 2018]  FINDINGS: There is a semisolid mass with underlying cystic changes and cavitation in the superior segment of the right lower lobe measuring slightly greater than 6 cm in size. This has not changed significantly since the prior outside examination. Peripheral groundglass and airspace infiltrates in the right lower lobe inferiorly are stable as are the groundglass infiltrates in the inferior right upper lobe, some of which contain cystic changes. Mild interlobular septal thickening in the right upper lobe persists.  2 mm lingular nodule (2/157) is unchanged. Right perihilar and lower lobe bronchial wall thickening is again noted. Right IJ Port-A-Cath tip in the upper RA. Old right rib fracture. Mild emphysematous changes in the upper lungs. No thoracic adenopathy by size criteria. ---------------------------------------------------------- 09/14/2016- IMPRESSION:  No CT evidence of metastatic disease in the abdomen and pelvis.   RADIOGRAPHIC STUDIES: I have personally reviewed the radiological images as listed and agreed with the findings in the report. US Venous Img Lower Bilateral  Result Date: 12/01/2016 CLINICAL DATA:  Lower extremity swelling, left greater than right EXAM: BILATERAL LOWER EXTREMITY VENOUS DOPPLER ULTRASOUND TECHNIQUE: Gray-scale sonography with graded compression, as well as color Doppler and duplex ultrasound were performed to evaluate the lower extremity deep venous systems from the level of the common femoral vein and including the common femoral, femoral, profunda femoral, popliteal and calf veins including the posterior tibial, peroneal and gastrocnemius veins when visible. The  superficial great saphenous vein was also interrogated. Spectral Doppler was utilized to evaluate flow at rest and with distal augmentation maneuvers in the common femoral, femoral and popliteal veins. COMPARISON:  None. FINDINGS: RIGHT LOWER EXTREMITY Common Femoral Vein: No evidence of thrombus. Normal compressibility, respiratory phasicity and response to augmentation. Saphenofemoral Junction: No evidence of thrombus. Normal compressibility and flow on color  Doppler imaging. Profunda Femoral Vein: No evidence of thrombus. Normal compressibility and flow on color Doppler imaging. Femoral Vein: No evidence of thrombus. Normal compressibility, respiratory phasicity and response to augmentation. Popliteal Vein: No evidence of thrombus. Normal compressibility, respiratory phasicity and response to augmentation. Calf Veins: No evidence of thrombus. Normal compressibility and flow on color Doppler imaging. Superficial Great Saphenous Vein: No evidence of thrombus. Normal compressibility. Venous Reflux:  None. Other Findings:  None. LEFT LOWER EXTREMITY Common Femoral Vein: No evidence of thrombus. Normal compressibility, respiratory phasicity and response to augmentation. Saphenofemoral Junction: No evidence of thrombus. Normal compressibility and flow on color Doppler imaging. Profunda Femoral Vein: No evidence of thrombus. Normal compressibility and flow on color Doppler imaging. Femoral Vein: No evidence of thrombus. Normal compressibility, respiratory phasicity and response to augmentation. Popliteal Vein: No evidence of thrombus. Normal compressibility, respiratory phasicity and response to augmentation. Calf Veins: No evidence of thrombus. Normal compressibility and flow on color Doppler imaging. Superficial Great Saphenous Vein: No evidence of thrombus. Normal compressibility. Venous Reflux:  None. Other Findings:  None. IMPRESSION: No evidence of deep venous thrombosis. Electronically Signed   By: Jerilynn Mages.  Shick M.D.    On: 12/01/2016 12:30     ASSESSMENT & PLAN:  Primary cancer of right lower lobe of lung (HCC) T4 N2 M1-adenocarcinoma of the lung right side-on cabo-Taxol- Avastin. SEP 11th CT Chest [facility] clinic] STABLE disease; MRI Brain- stable/improved right frontal lesion ; however up to 4 new subcentimeter lesions.  # continue maintenance Avastin . Patient tolerating treatment fairly well; No clinical signs of progression.  # Symptomatic brain metastases- right frontal ~2 cm/vasogenic edema status post gamma knife [Nov 2017]; subcentimeter S/p GK in [facility] clinic Sep 2018 "4 more lesions". Symptomatic improvement noted. Awaiting repeat scans in jan 2019/[facility].   # Bil LE cramping/tenderness- ? SVT vs DVT- recommend stat dopplers.   # PN-G-1.  Sec to taxol; neurotin TID-improving.   # follow up with me in 3 weeks/labs-UA/avastin alone. Plan follow up in [place]- Jan 9th 2019/ scans to be done at [facility].   Addendum: Lower extremity venous Dopplers negative for DVT/SVT.  Orders Placed This Encounter  Procedures  . US Venous Img Lower Bilateral    Standing Status:   Future    Number of Occurrences:   1    Standing Expiration Date:   01/31/2018    Order Specific Question:   Reason for Exam (SYMPTOM  OR DIAGNOSIS REQUIRED)    Answer:   leg swelling    Order Specific Question:   Preferred imaging location?    Answer:    [facility]    Order Specific Question:   Call Results- Best Contact Number?    Answer:   [phone]- patient can leave when done     Cammie Sickle, MD 12/02/2016 9:14 AM

## 2016-12-01 NOTE — Assessment & Plan Note (Addendum)
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 SEP 11th CT Chest [facility] clinic] STABLE disease; MRI Brain- stable/improved right frontal lesion ; however up to 4 new subcentimeter lesions.  # continue maintenance Avastin . Patient tolerating treatment fairly well; No clinical signs of progression.  # Symptomatic brain metastases- right frontal ~2 cm/vasogenic edema status post gamma knife [Nov 2017]; subcentimeter S/p GK in [facility] clinic Sep 2018 "4 more lesions". Symptomatic improvement noted. Awaiting repeat scans in jan 2019/[facility].   # Bil LE cramping/tenderness- ? SVT vs DVT- recommend stat dopplers.   # PN-G-1.  Sec to taxol; neurotin TID-improving.   # follow up with me in 3 weeks/labs-UA/avastin alone. Plan follow up in [place]- Jan 9th 2019/ scans to be done at [facility].   Addendum: Lower extremity venous Dopplers negative for DVT/SVT.

## 2016-12-15 ENCOUNTER — Other Ambulatory Visit: Payer: Self-pay | Admitting: Oncology

## 2016-12-22 ENCOUNTER — Other Ambulatory Visit: Payer: Self-pay

## 2016-12-22 ENCOUNTER — Encounter: Payer: Self-pay | Admitting: Internal Medicine

## 2016-12-22 ENCOUNTER — Inpatient Hospital Stay: Payer: BLUE CROSS/BLUE SHIELD

## 2016-12-22 ENCOUNTER — Inpatient Hospital Stay (HOSPITAL_BASED_OUTPATIENT_CLINIC_OR_DEPARTMENT_OTHER): Payer: BLUE CROSS/BLUE SHIELD | Admitting: Internal Medicine

## 2016-12-22 ENCOUNTER — Inpatient Hospital Stay: Payer: BLUE CROSS/BLUE SHIELD | Attending: Internal Medicine

## 2016-12-22 VITALS — BP 150/72 | HR 65 | Temp 97.8°F | Resp 20 | Ht 62.0 in | Wt 128.9 lb

## 2016-12-22 DIAGNOSIS — Z8042 Family history of malignant neoplasm of prostate: Secondary | ICD-10-CM | POA: Insufficient documentation

## 2016-12-22 DIAGNOSIS — F419 Anxiety disorder, unspecified: Secondary | ICD-10-CM

## 2016-12-22 DIAGNOSIS — C7931 Secondary malignant neoplasm of brain: Secondary | ICD-10-CM

## 2016-12-22 DIAGNOSIS — Z5112 Encounter for antineoplastic immunotherapy: Secondary | ICD-10-CM | POA: Diagnosis not present

## 2016-12-22 DIAGNOSIS — F329 Major depressive disorder, single episode, unspecified: Secondary | ICD-10-CM

## 2016-12-22 DIAGNOSIS — G629 Polyneuropathy, unspecified: Secondary | ICD-10-CM | POA: Diagnosis not present

## 2016-12-22 DIAGNOSIS — C3431 Malignant neoplasm of lower lobe, right bronchus or lung: Secondary | ICD-10-CM

## 2016-12-22 DIAGNOSIS — Z79899 Other long term (current) drug therapy: Secondary | ICD-10-CM | POA: Diagnosis not present

## 2016-12-22 DIAGNOSIS — R252 Cramp and spasm: Secondary | ICD-10-CM | POA: Insufficient documentation

## 2016-12-22 DIAGNOSIS — Z87891 Personal history of nicotine dependence: Secondary | ICD-10-CM | POA: Insufficient documentation

## 2016-12-22 DIAGNOSIS — Z8701 Personal history of pneumonia (recurrent): Secondary | ICD-10-CM | POA: Insufficient documentation

## 2016-12-22 DIAGNOSIS — G893 Neoplasm related pain (acute) (chronic): Secondary | ICD-10-CM

## 2016-12-22 LAB — COMPREHENSIVE METABOLIC PANEL
ALBUMIN: 4 g/dL (ref 3.5–5.0)
ALK PHOS: 49 U/L (ref 38–126)
ALT: 10 U/L — ABNORMAL LOW (ref 14–54)
ANION GAP: 10 (ref 5–15)
AST: 22 U/L (ref 15–41)
BILIRUBIN TOTAL: 0.5 mg/dL (ref 0.3–1.2)
BUN: 15 mg/dL (ref 6–20)
CALCIUM: 9.1 mg/dL (ref 8.9–10.3)
CO2: 23 mmol/L (ref 22–32)
Chloride: 103 mmol/L (ref 101–111)
Creatinine, Ser: 0.7 mg/dL (ref 0.44–1.00)
Glucose, Bld: 102 mg/dL — ABNORMAL HIGH (ref 65–99)
POTASSIUM: 3.9 mmol/L (ref 3.5–5.1)
Sodium: 136 mmol/L (ref 135–145)
TOTAL PROTEIN: 7 g/dL (ref 6.5–8.1)

## 2016-12-22 LAB — CBC WITH DIFFERENTIAL/PLATELET
BASOS ABS: 0 10*3/uL (ref 0–0.1)
BASOS PCT: 1 %
Eosinophils Absolute: 0.1 10*3/uL (ref 0–0.7)
Eosinophils Relative: 3 %
HEMATOCRIT: 35.8 % (ref 35.0–47.0)
Hemoglobin: 12.4 g/dL (ref 12.0–16.0)
LYMPHS PCT: 24 %
Lymphs Abs: 1.2 10*3/uL (ref 1.0–3.6)
MCH: 37.6 pg — ABNORMAL HIGH (ref 26.0–34.0)
MCHC: 34.7 g/dL (ref 32.0–36.0)
MCV: 108.5 fL — AB (ref 80.0–100.0)
MONO ABS: 0.5 10*3/uL (ref 0.2–0.9)
Monocytes Relative: 9 %
NEUTROS ABS: 3.1 10*3/uL (ref 1.4–6.5)
NEUTROS PCT: 63 %
PLATELETS: 231 10*3/uL (ref 150–440)
RBC: 3.3 MIL/uL — AB (ref 3.80–5.20)
RDW: 13 % (ref 11.5–14.5)
WBC: 4.9 10*3/uL (ref 3.6–11.0)

## 2016-12-22 LAB — URINALYSIS, COMPLETE (UACMP) WITH MICROSCOPIC
BILIRUBIN URINE: NEGATIVE
GLUCOSE, UA: NEGATIVE mg/dL
KETONES UR: NEGATIVE mg/dL
NITRITE: NEGATIVE
PH: 6 (ref 5.0–8.0)
Protein, ur: NEGATIVE mg/dL
SPECIFIC GRAVITY, URINE: 1.008 (ref 1.005–1.030)

## 2016-12-22 MED ORDER — SODIUM CHLORIDE 0.9 % IV SOLN: 15.0000 mg/kg | Freq: Once | INTRAVENOUS | Status: DC

## 2016-12-22 MED ORDER — SODIUM CHLORIDE 0.9 % IV SOLN
900.0000 mg | Freq: Once | INTRAVENOUS | Status: AC
  Administered 2016-12-22: 900 mg via INTRAVENOUS
  Filled 2016-12-22: qty 16

## 2016-12-22 MED ORDER — LIDOCAINE-PRILOCAINE 2.5-2.5 % EX CREA: 1.0000 "application " | TOPICAL_CREAM | CUTANEOUS | 6 refills | Status: AC | PRN

## 2016-12-22 MED ORDER — HEPARIN SOD (PORK) LOCK FLUSH 100 UNIT/ML IV SOLN
500.0000 [IU] | Freq: Once | INTRAVENOUS | Status: AC
  Administered 2016-12-22: 500 [IU] via INTRAVENOUS

## 2016-12-22 MED ORDER — SODIUM CHLORIDE 0.9 % IV SOLN
Freq: Once | INTRAVENOUS | Status: AC
  Administered 2016-12-22: 10:00:00 via INTRAVENOUS
  Filled 2016-12-22: qty 1000

## 2016-12-22 MED ORDER — HEPARIN SOD (PORK) LOCK FLUSH 100 UNIT/ML IV SOLN
INTRAVENOUS | Status: AC
  Filled 2016-12-22: qty 5

## 2016-12-22 MED ORDER — SODIUM CHLORIDE 0.9% FLUSH
10.0000 mL | INTRAVENOUS | Status: DC | PRN
  Administered 2016-12-22: 10 mL via INTRAVENOUS
  Filled 2016-12-22: qty 10

## 2016-12-22 MED ORDER — OXYCODONE HCL 10 MG PO TABS: 10.0000 mg | ORAL_TABLET | Freq: Four times a day (QID) | ORAL | 0 refills | Status: DC

## 2016-12-22 NOTE — Progress Notes (Signed)
Patient presents to clinic in follow-up for lung cancer. She request RF on emla cream and oxycodone.

## 2016-12-22 NOTE — Assessment & Plan Note (Addendum)
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 SEP 11th CT Chest [facility] clinic] STABLE disease; MRI Brain- stable/improved right frontal lesion ; however up to 4 new subcentimeter lesions.  # continue maintenance Avastin . Patient tolerating treatment fairly well; No clinical signs of progression.  # Symptomatic brain metastases- right frontal ~2 cm/vasogenic edema status post gamma knife [Nov 2017]; subcentimeter S/p GK in [facility] clinic Sep 2018 "4 more lesions". Symptomatic improvement noted. Awaiting repeat scans in jan 2019/[facility].   # Blood pressure- elevated 150s/80-discussed at length secondary to Avastin.  Recommend checking pressure; and calling us-if systolic [phone]; diastolic greater than 90.  # Proteinuria- UA 30+; previously ; today -UA negative for protein.  However check random prot;creat ratio next visit.  # Bil LE cramping/tenderness- improved; dopplers-NEG; on capsaician prn.    # PN-G-1.  Sec to taxol; neurotin TID-improving.  Refill oxycodone today.  # follow up with me in 4 weeks/labs-UA/avastin alone. [facility]- Jan 9th 2019/ scans to be done at [facility].

## 2016-12-22 NOTE — Patient Instructions (Signed)
Check blood pressure at home-keep a log; call us if top number is greater than 140-150 or bottom number greater than 90 consistently

## 2016-12-22 NOTE — Progress Notes (Signed)
Deanna Blair OFFICE PROGRESS NOTE  Patient Care Team: Deanna Daring, PA-C as PCP - General (Family Medicine) Deanna Gee, MD as Referring Physician (Internal Medicine)  Malignant neoplasm of trachea, bronchus, and lung [facility])   Staging form: Lung, AJCC 7th Edition     Clinical: Stage IIIB (T4, N2, M0) - Signed by Deanna Sickle, MD on 06/24/2015    Oncology History   # FEB 2017-[place] CA Right Lower Lung T4N2M0- STAGE IIIB [Bronch & Subcarinal LNBx]- March 8th START CARBO-ALIMTA x4 cycles- June 8th 2017 - IMPROVED FDG MULTI-FOCAL lesions/N2-LN activity. S/p [place]-- Alimta x6  # AUG 18th- Alimta- Avastin maintenance; SEP 13th DISCONTINUE AVASTIN [sec to cavitary lesion]; OCT 20th 2017- CT-STABLE Disease; NOV 8th CT/PET [facility] clinic]- "overall slight progression-? Lymphangiectatic spread"  # April 25th 2018- [place]- Taxol x3 ccyles; June 2018- CT chest STABLE Disease;July 25th- Add Avastin to [place] taxol x6 cycles- Sep 2018-CT scan- [facility] STABLE   # Sep 2018- Avastin Maintence  # NOV 2nd 2017-MRI, [facility]- Brain mets [asymptomatic;Right frontal ~76m; ~341mlesions -appx 3-4 lesions; (right Frontal & Left parietal s/p GK x 2 lesions] ; June 2018- Progession vs radiation necrosis [July 2th-Add Avastin]  # [facility] 4 small subcentimeter brain lesions for which she had the Gamma knife. The main right frontal lesion gotten smaller; improve edema.   --------------------------------------------------------------     MOLECULAR STUDIES:  KRAS MUTATED/Tissues not sufficient for OTHER the molecular marker; will need repeat Bx ; GUARDIANT TESTING [[facility]]- pending. [phone]]    [place]    INTERVAL HISTORY:  WaRazia Screws89.o.  female pleasant patient above history of Recurrent Metastatic adenocarcinoma the lung; And brain metastases- Currently on  Avastin maintenance is here for follow-up.  Patient continues to have intermittent cramping in the legs/calves.  Bilateral lower extremity ultrasound/Doppler is negative at last visit.  Is currently on capsaicin ointment.  Intermittently helping.  Patient denies any worsening shortness of breath or cough.  Denies any chest pain.  No back pain.  No headaches.  Not on steroids.  Denies any swelling in the legs.   REVIEW OF SYSTEMS:  A complete 10 point review of system is done which is negative except mentioned above/history of present illness.   PAST MEDICAL HISTORY :  Past Medical History:  Diagnosis Date  . Anxiety   . [place]  . Depression   . Depression with anxiety    Well controlled with Wellbutrin and Buspar  . Pneumonia     PAST SURGICAL HISTORY :   Past Surgical History:  Procedure Laterality Date  . ECTOPIC PREGNANCY SURGERY  1988  . ELECTROMAGNETIC NAVIGATION BROCHOSCOPY N/A 02/10/2015   Procedure: ELECTROMAGNETIC NAVIGATION BRONCHOSCOPY;  Surgeon: KuFlora LippsMD;  Location: [facility] ORS;  Service: Cardiopulmonary;  Laterality: N/A;  . ENDOBRONCHIAL ULTRASOUND N/A 02/10/2015   Procedure: ENDOBRONCHIAL ULTRASOUND;  Surgeon: KuFlora LippsMD;  Location: [facility] ORS;  Service: Cardiopulmonary;  Laterality: N/A;  . PERIPHERAL VASCULAR CATHETERIZATION N/A 03/05/2015   Procedure: PoGlori Luisath Insertion;  Surgeon: GrKatha CabalMD;  [facility];  Service: Cardiovascular;  Laterality: N/A;    FAMILY HISTORY :   Family History  Problem Relation Age of Onset  . Cancer Father        Prostate Cancer  . Hypertension Father   . Stroke Father   .  Hypertension Brother     SOCIAL HISTORY:   Social History   Tobacco Use  . Smoking status: Former Smoker    Packs/day: 0.25    Years: 30.00    Pack years: 7.50    Types: Cigarettes    Last attempt to quit: 02/06/2005    Years since quitting: 11.8  . Smokeless tobacco: Never Used  Substance  Use Topics  . Alcohol use: Yes    Alcohol/week: 3.0 oz    Types: 5 Cans of beer per week    Comment: 5 beers weekly  . Drug use: No    ALLERGIES:  has No Known Allergies.  MEDICATIONS:  Current Outpatient Medications  Medication Sig Dispense Refill  . acetaminophen (TYLENOL) 500 MG tablet Take 1,000 mg by mouth every 4 (four) hours as needed.     . busPIRone (BUSPAR) 5 MG tablet Take 1 tablet (5 mg total) by mouth 2 (two) times daily. 60 tablet 6  . cyclobenzaprine (FLEXERIL) 5 MG tablet Take 5 mg by mouth 3 (three) times daily as needed for muscle spasms.    [name] Kitchen docusate sodium (COLACE) 100 MG capsule Take 100 mg by mouth 2 (two) times daily.    [name] Kitchen gabapentin (NEURONTIN) 100 MG capsule Take 2 capsules (200 mg total) by mouth 3 (three) times daily. 180 capsule 3  . lidocaine-prilocaine (EMLA) cream Apply 1 application topically as needed. 30 g 6  . ondansetron (ZOFRAN) 8 MG tablet Take 1 tablet (8 mg total) by mouth every 8 (eight) hours as needed for nausea or vomiting (start 3 days; after chemo). 40 tablet 1  . prochlorperazine (COMPAZINE) 10 MG tablet Take 1 tablet (10 mg total) by mouth every 6 (six) hours as needed for nausea or vomiting. 25 tablet 1  . SPIRIVA RESPIMAT 1.25 MCG/ACT AERS Inhale 1 puff into the lungs daily.     . SYMBICORT 80-4.5 MCG/ACT inhaler Inhale 1 puff into the lungs every 12 (twelve) hours.     [name] Kitchen zolpidem (AMBIEN) 5 MG tablet TAKE 1 TABLET BY MOUTH AT BEDTIME AS NEEDED FOR SLEEP 30 tablet 2  . Oxycodone HCl 10 MG TABS Take 1 tablet (10 mg total) by mouth every 6 (six) hours. 60 tablet 0   No current facility-administered medications for this visit.    Facility-Administered Medications Ordered in Other Visits  Medication Dose Route Frequency Provider Last Rate Last Dose  . sodium chloride flush (NS) 0.9 % injection 10 mL  10 mL Intravenous PRN Deanna Sickle, MD   10 mL at 12/22/16 0836    PHYSICAL EXAMINATION: ECOG PERFORMANCE STATUS: 0 -  Asymptomatic  BP (!) 150/72 (BP Location: Left Arm, Patient Position: Sitting)   Pulse 65   Temp 97.8 F (36.6 C) (Tympanic)   Resp 20   Ht 5' 2"  (1.575 m)   Wt 128 lb 14.4 oz (58.5 kg)   LMP 06/16/2000 (Approximate) Comment: 15 yrs ago  BMI 23.58 kg/m   Filed Weights   12/22/16 0853  Weight: 128 lb 14.4 oz (58.5 kg)    GENERAL: Well-nourished well-developed; Alert, no distress and comfortable. She is accompanied by her husband.  EYES: no pallor or icterus OROPHARYNX: no thrush or ulceration; good dentition  NECK: supple, no masses felt LYMPH:  no palpable lymphadenopathy in the cervical, axillary or inguinal regions LUNGS: clear to auscultation and  No wheeze or crackles HEART/CVS: regular rate & rhythm and no murmurs; No lower extremity edema;No erythema. ABDOMEN:abdomen soft, non-tender and normal bowel sounds  Musculoskeletal:no cyanosis of digits and no clubbing  PSYCH: alert & oriented x 3 with fluent speech.  NEURO: no focal motor/sensory deficits SKIN:  no rashes or significant lesions  LABORATORY DATA:  I have reviewed the data as listed    Component Value Date/Time   NA 136 12/22/2016 0848   NA 139 01/03/2015 0910   K 3.9 12/22/2016 0848   CL 103 12/22/2016 0848   CO2 23 12/22/2016 0848   GLUCOSE 102 (H) 12/22/2016 0848   BUN 15 12/22/2016 0848   BUN 10 01/03/2015 0910   CREATININE 0.70 12/22/2016 0848   CALCIUM 9.1 12/22/2016 0848   PROT 7.0 12/22/2016 0848   PROT 6.5 01/03/2015 0910   ALBUMIN 4.0 12/22/2016 0848   ALBUMIN 3.8 01/03/2015 0910   AST 22 12/22/2016 0848   ALT 10 (L) 12/22/2016 0848   ALKPHOS 49 12/22/2016 0848   BILITOT 0.5 12/22/2016 0848   BILITOT <0.2 01/03/2015 0910   GFRNONAA >60 12/22/2016 0848   GFRAA >60 12/22/2016 0848    No results found for: SPEP, UPEP  Lab Results  Component Value Date   WBC 4.9 12/22/2016   NEUTROABS 3.1 12/22/2016   HGB 12.4 12/22/2016   HCT 35.8 12/22/2016   MCV 108.5 (H) 12/22/2016   PLT 231  12/22/2016      Chemistry      Component Value Date/Time   NA 136 12/22/2016 0848   NA 139 01/03/2015 0910   K 3.9 12/22/2016 0848   CL 103 12/22/2016 0848   CO2 23 12/22/2016 0848   BUN 15 12/22/2016 0848   BUN 10 01/03/2015 0910   CREATININE 0.70 12/22/2016 0848      Component Value Date/Time   CALCIUM 9.1 12/22/2016 0848   ALKPHOS 49 12/22/2016 0848   AST 22 12/22/2016 0848   ALT 10 (L) 12/22/2016 0848   BILITOT 0.5 12/22/2016 0848   BILITOT <0.2 01/03/2015 0910     09/14/2016 [compared to June 2018]  FINDINGS: There is a semisolid mass with underlying cystic changes and cavitation in the superior segment of the right lower lobe measuring slightly greater than 6 cm in size. This has not changed significantly since the prior outside examination. Peripheral groundglass and airspace infiltrates in the right lower lobe inferiorly are stable as are the groundglass infiltrates in the inferior right upper lobe, some of which contain cystic changes. Mild interlobular septal thickening in the right upper lobe persists.  2 mm lingular nodule (2/157) is unchanged. Right perihilar and lower lobe bronchial wall thickening is again noted. Right IJ Port-A-Cath tip in the upper RA. Old right rib fracture. Mild emphysematous changes in the upper lungs. No thoracic adenopathy by size criteria. ---------------------------------------------------------- 09/14/2016- IMPRESSION:  No CT evidence of metastatic disease in the abdomen and pelvis.   RADIOGRAPHIC STUDIES: I have personally reviewed the radiological images as listed and agreed with the findings in the report. No results found.   ASSESSMENT & PLAN:  Primary cancer of right lower lobe of lung ([place]) T4 N2 M1-adenocarcinoma of the lung right side-on cabo-Taxol- Avastin. SEP 11th CT Chest [facility] clinic] STABLE disease; MRI Brain- stable/improved right frontal lesion ; however up to 4 new subcentimeter lesions.  # continue maintenance  Avastin . Patient tolerating treatment fairly well; No clinical signs of progression.  # Symptomatic brain metastases- right frontal ~2 cm/vasogenic edema status post gamma knife [Nov 2017]; subcentimeter S/p GK in [facility] clinic Sep 2018 "4 more lesions". Symptomatic improvement noted. Awaiting repeat scans in  jan 2019/[facility].   # Blood pressure- elevated 150s/80-discussed at length secondary to Avastin.  Recommend checking pressure; and calling us-if systolic 504-136; diastolic greater than 90.  # Proteinuria- UA 30+; previously ; today -UA negative for protein.  However check random prot;creat ratio next visit.  # Bil LE cramping/tenderness- improved; dopplers-NEG; on capsaician prn.    # PN-G-1.  Sec to taxol; neurotin TID-improving.  Refill oxycodone today.  # follow up with me in 4 weeks/labs-UA/avastin alone. [facility]- Jan 9th 2019/ scans to be done at [facility].   Orders Placed This Encounter  Procedures  . CBC with Differential    Standing Status:   Future    Standing Expiration Date:   12/22/2017  . Comprehensive metabolic panel    Standing Status:   Future    Standing Expiration Date:   12/22/2017  . Urinalysis, Complete w Microscopic    Standing Status:   Future    Standing Expiration Date:   12/22/2017  . Protein / creatinine ratio, urine    Standing Status:   Future    Standing Expiration Date:   12/22/2017     Deanna Sickle, MD 12/22/2016 1:45 PM

## 2017-01-09 IMAGING — CT NM PET TUM IMG RESTAG (PS) SKULL BASE T - THIGH
1 of 10 series · 1 of 25 positions shown · non-contrast
Comparison: 02/24/2015 PET-CT.  05/07/2015 chest CT.

CLINICAL DATA: Subsequent treatment strategy for multifocal right
lung adenocarcinoma diagnosed on right lower lobe biopsy 02/10/2015,
status post chemotherapy, now presenting for restaging.

EXAM:
NUCLEAR MEDICINE PET SKULL BASE TO THIGH
TECHNIQUE: 12.4 mCi F-18 FDG was injected intravenously. Full-ring PET imaging
was performed from the skull base to thigh after the radiotracer. CT
data was obtained and used for attenuation correction and anatomic
localization.
FASTING BLOOD GLUCOSE:  Value: 92 mg/dl

[Series 3: ct wb 5.0 b30f · axial · 5.0mm · 0.98mm/px · 1 of 290 slices shown]
[im 290/290  brain]
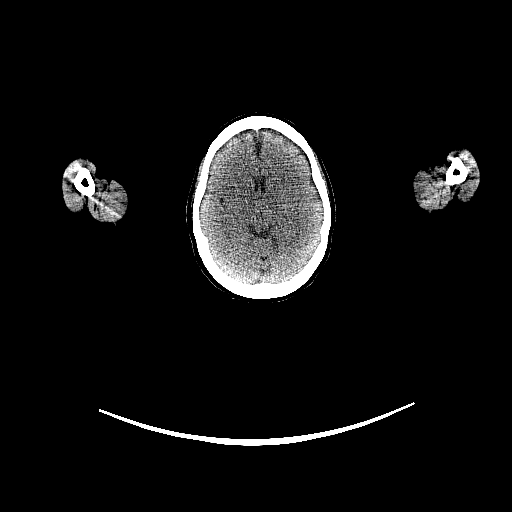

[1 of 25 positions shown; findings below may reference images not displayed]

FINDINGS: NECK

No hypermetabolic lymph nodes in the neck.

CHEST

Mildly hypermetabolic 0.8 cm right hilar node (series 3/image 84)
with max SUV 2.8, stable in size and slightly decreased in
metabolism with max SUV 3.1 on 02/24/2015.

Minimally hypermetabolic 0.9 cm AP window node (series 3/image 70)
with max SUV 2.5, previously 1.0 cm with max SUV 2.5 on 02/24/2015,
not appreciably changed in size and metabolism.

Right upper lobe 4.0 x 3.2 cm ground-glass lung mass (series 3/image
82) is not associated with significant metabolism (max SUV 1.3),
previously 4.1 x 2.9 cm with max SUV 3.0, stable in size and
decreased in metabolism.

Anterior basilar right upper lobe 2.6 x 1.5 cm ground-glass
pulmonary nodule (series 3/image 88) is not associated with
significant metabolism (max SUV 1.7), previously 2.5 x 1.7 cm with
max SUV 1.6, unchanged in size and metabolism.

Cavitary subsolid 5.6 x 3.4 cm posterior right lower lobe lung mass
(series 3/image 92) is mildly hypermetabolic with max SUV 2.8,
previously 5.5 x 3.1 cm with max SUV 3.2, stable in size and
slightly decreased in metabolism.

Basilar right lower lobe 4.8 x 4.5 cm ground-glass lung mass (series
3/image 118) is not associated with significant metabolism (max SUV
2.3), previously 4.8 x 4.6 cm with max SUV 1.6, stable in size and
not definitely changed in metabolism noting that this lesion is
close to the liver, limiting accurate metabolic assessment.

No hypermetabolic axillary nodes. Right internal jugular MediPort
terminates at the cavoatrial junction. Mildly atherosclerotic
nonaneurysmal thoracic aorta. No acute consolidative airspace
disease or new significant pulmonary nodules.

ABDOMEN/PELVIS

No abnormal hypermetabolic activity within the liver, pancreas,
adrenal glands, or spleen. No hypermetabolic lymph nodes in the
abdomen or pelvis. Mild sigmoid diverticulosis.

SKELETON

No focal hypermetabolic activity to suggest skeletal metastasis.
IMPRESSION: 1. No convincing PET-CT evidence of progression of multifocal right
lung adenocarcinoma since 02/24/2015. Dominant cavitary mildly
hypermetabolic 5.6 cm right lower lobe lung mass is stable in size
and slightly decreased in metabolism. Separate ground-glass right
lung masses are stable in size and are not hypermetabolic.
2. Mildly hypermetabolic right hilar and AP window lymphadenopathy
is stable to slightly decreased in metabolism.
3. No hypermetabolic extrathoracic metastatic disease.

## 2017-01-12 ENCOUNTER — Ambulatory Visit: Payer: BLUE CROSS/BLUE SHIELD | Admitting: Internal Medicine

## 2017-01-12 ENCOUNTER — Ambulatory Visit: Payer: BLUE CROSS/BLUE SHIELD

## 2017-01-12 ENCOUNTER — Other Ambulatory Visit: Payer: BLUE CROSS/BLUE SHIELD

## 2017-01-17 ENCOUNTER — Other Ambulatory Visit: Payer: Self-pay | Admitting: *Deleted

## 2017-01-17 ENCOUNTER — Other Ambulatory Visit: Payer: Self-pay

## 2017-01-17 DIAGNOSIS — C3431 Malignant neoplasm of lower lobe, right bronchus or lung: Secondary | ICD-10-CM

## 2017-01-17 DIAGNOSIS — C3481 Malignant neoplasm of overlapping sites of right bronchus and lung: Secondary | ICD-10-CM

## 2017-01-17 DIAGNOSIS — G893 Neoplasm related pain (acute) (chronic): Secondary | ICD-10-CM

## 2017-01-17 MED ORDER — SYMBICORT 80-4.5 MCG/ACT IN AERO: 1.0000 | INHALATION_SPRAY | Freq: Two times a day (BID) | RESPIRATORY_TRACT | 1 refills | Status: DC

## 2017-01-17 MED ORDER — OXYCODONE HCL 10 MG PO TABS: 10.0000 mg | ORAL_TABLET | Freq: Four times a day (QID) | ORAL | 0 refills | Status: DC

## 2017-01-19 ENCOUNTER — Ambulatory Visit: Payer: BLUE CROSS/BLUE SHIELD | Admitting: Internal Medicine

## 2017-01-19 ENCOUNTER — Ambulatory Visit: Payer: BLUE CROSS/BLUE SHIELD

## 2017-01-19 ENCOUNTER — Other Ambulatory Visit: Payer: BLUE CROSS/BLUE SHIELD

## 2017-01-24 ENCOUNTER — Inpatient Hospital Stay (HOSPITAL_BASED_OUTPATIENT_CLINIC_OR_DEPARTMENT_OTHER): Payer: BLUE CROSS/BLUE SHIELD | Admitting: Internal Medicine

## 2017-01-24 ENCOUNTER — Inpatient Hospital Stay: Payer: BLUE CROSS/BLUE SHIELD | Attending: Internal Medicine

## 2017-01-24 ENCOUNTER — Inpatient Hospital Stay: Payer: BLUE CROSS/BLUE SHIELD

## 2017-01-24 VITALS — BP 141/91 | HR 69 | Temp 97.8°F | Resp 16 | Wt 131.0 lb

## 2017-01-24 DIAGNOSIS — Z87891 Personal history of nicotine dependence: Secondary | ICD-10-CM | POA: Diagnosis not present

## 2017-01-24 DIAGNOSIS — Z801 Family history of malignant neoplasm of trachea, bronchus and lung: Secondary | ICD-10-CM | POA: Diagnosis not present

## 2017-01-24 DIAGNOSIS — Z79899 Other long term (current) drug therapy: Secondary | ICD-10-CM

## 2017-01-24 DIAGNOSIS — Z5112 Encounter for antineoplastic immunotherapy: Secondary | ICD-10-CM | POA: Insufficient documentation

## 2017-01-24 DIAGNOSIS — C7800 Secondary malignant neoplasm of unspecified lung: Secondary | ICD-10-CM

## 2017-01-24 DIAGNOSIS — C3431 Malignant neoplasm of lower lobe, right bronchus or lung: Secondary | ICD-10-CM

## 2017-01-24 DIAGNOSIS — C7931 Secondary malignant neoplasm of brain: Secondary | ICD-10-CM

## 2017-01-24 LAB — CBC WITH DIFFERENTIAL/PLATELET
BASOS ABS: 0 10*3/uL (ref 0–0.1)
Basophils Relative: 1 %
Eosinophils Absolute: 0.1 10*3/uL (ref 0–0.7)
Eosinophils Relative: 2 %
HEMATOCRIT: 36.6 % (ref 35.0–47.0)
Hemoglobin: 12.9 g/dL (ref 12.0–16.0)
LYMPHS PCT: 19 %
Lymphs Abs: 1 10*3/uL (ref 1.0–3.6)
MCH: 37.4 pg — ABNORMAL HIGH (ref 26.0–34.0)
MCHC: 35.2 g/dL (ref 32.0–36.0)
MCV: 106.2 fL — AB (ref 80.0–100.0)
Monocytes Absolute: 0.4 10*3/uL (ref 0.2–0.9)
Monocytes Relative: 8 %
NEUTROS ABS: 3.8 10*3/uL (ref 1.4–6.5)
NEUTROS PCT: 70 %
Platelets: 287 10*3/uL (ref 150–440)
RBC: 3.44 MIL/uL — AB (ref 3.80–5.20)
RDW: 12.8 % (ref 11.5–14.5)
WBC: 5.4 10*3/uL (ref 3.6–11.0)

## 2017-01-24 LAB — URINALYSIS, COMPLETE (UACMP) WITH MICROSCOPIC
Bilirubin Urine: NEGATIVE
GLUCOSE, UA: NEGATIVE mg/dL
Hgb urine dipstick: NEGATIVE
Ketones, ur: NEGATIVE mg/dL
Leukocytes, UA: NEGATIVE
Nitrite: POSITIVE — AB
PROTEIN: NEGATIVE mg/dL
RBC / HPF: NONE SEEN RBC/hpf (ref 0–5)
Specific Gravity, Urine: 1.025 (ref 1.005–1.030)
pH: 5 (ref 5.0–8.0)

## 2017-01-24 LAB — COMPREHENSIVE METABOLIC PANEL
ALT: 9 U/L — ABNORMAL LOW (ref 14–54)
AST: 20 U/L (ref 15–41)
Albumin: 4 g/dL (ref 3.5–5.0)
Alkaline Phosphatase: 49 U/L (ref 38–126)
Anion gap: 11 (ref 5–15)
BILIRUBIN TOTAL: 0.5 mg/dL (ref 0.3–1.2)
BUN: 15 mg/dL (ref 6–20)
CHLORIDE: 104 mmol/L (ref 101–111)
CO2: 23 mmol/L (ref 22–32)
Calcium: 9.6 mg/dL (ref 8.9–10.3)
Creatinine, Ser: 0.74 mg/dL (ref 0.44–1.00)
GFR calc Af Amer: >60 mL/min (ref >60)
GLUCOSE: 108 mg/dL — AB (ref 65–99)
Potassium: 3.6 mmol/L (ref 3.5–5.1)
Sodium: 138 mmol/L (ref 135–145)
Total Protein: 7.2 g/dL (ref 6.5–8.1)

## 2017-01-24 MED ORDER — SODIUM CHLORIDE 0.9 % IV SOLN
Freq: Once | INTRAVENOUS | Status: AC
  Administered 2017-01-24: 10:00:00 via INTRAVENOUS
  Filled 2017-01-24: qty 1000

## 2017-01-24 MED ORDER — SODIUM CHLORIDE 0.9% FLUSH
10.0000 mL | INTRAVENOUS | Status: DC | PRN
  Filled 2017-01-24: qty 10

## 2017-01-24 MED ORDER — HEPARIN SOD (PORK) LOCK FLUSH 100 UNIT/ML IV SOLN
500.0000 [IU] | Freq: Once | INTRAVENOUS | Status: DC | PRN
  Filled 2017-01-24: qty 5

## 2017-01-24 MED ORDER — HEPARIN SOD (PORK) LOCK FLUSH 100 UNIT/ML IV SOLN
500.0000 [IU] | Freq: Once | INTRAVENOUS | Status: AC
  Administered 2017-01-24: 500 [IU] via INTRAVENOUS

## 2017-01-24 MED ORDER — SODIUM CHLORIDE 0.9 % IV SOLN
900.0000 mg | Freq: Once | INTRAVENOUS | Status: AC
  Administered 2017-01-24: 900 mg via INTRAVENOUS
  Filled 2017-01-24: qty 32

## 2017-01-24 NOTE — Assessment & Plan Note (Addendum)
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 JAN 9th CT Chest [facility] clinic] Progression  Of lung disease vs ? Infection  [less likley]; MRI Brain-improved right frontal lesion; new 2 mm lesion noted in right paietal lobe.   # continue asvatin today; discused re: Ram-Tax; starting next cycle/ 3 weeks; if molecular testing at [facility] unrevealing for any targeted mutations.  I personally called [facility]; left a message for Dr. Malachi Paradise.    #Discussed the potential side effects of chemotherapy-Taxotere-myalgias neuropathy; steroid premedications etc.  # Symptomatic brain metastases- s/p GK in [facility]; but new 2 mm lesion noted; No GK; to be monitored.   # PN-G-1.  Sec to taxol; neurotin TID-improving.  # follow up in 3-wed weeks; Tax-Ram [facility]]; labs/MD; UA.

## 2017-01-24 NOTE — Progress Notes (Signed)
[place] OFFICE PROGRESS NOTE  Patient Care Team: Mar Daring, PA-C as PCP - General (Family Medicine) Allyne Gee, MD as Referring Physician (Internal Medicine)  Malignant neoplasm of trachea, bronchus, and lung [facility])   Staging form: Lung, AJCC 7th Edition     Clinical: Stage IIIB (T4, N2, M0) - Signed by Cammie Sickle, MD on 06/24/2015    Oncology History   # FEB 2017-[place] CA Right Lower Lung T4N2M0- STAGE IIIB [Bronch & Subcarinal LNBx]- March 8th START CARBO-ALIMTA x4 cycles- June 8th 2017 - IMPROVED FDG MULTI-FOCAL lesions/N2-LN activity. S/p [place]-- Alimta x6  # AUG 18th- [place]- Avastin maintenance; SEP 13th DISCONTINUE AVASTIN [sec to cavitary lesion]; OCT 20th 2017- CT-STABLE Disease; NOV 8th CT/PET [facility] clinic]- "overall slight progression-? Lymphangiectatic spread"  # Jan - April 2018Clement Husbands; progression  # April 25th 2018- [place]- Taxol x3 ccyles; June 2018- CT chest STABLE Disease;July 25th- Add Avastin to [place] taxol x6 cycles- Sep 2018-CT scan- [facility] STABLE   # Sep 2018- Avastin Maintence;   JAN 9th CT- right lung progression [s/p Bx- [place]; [place] clinic; F-one- 1/15]  # NOV 2nd 2017-MRI, [facility]- Brain mets [asymptomatic;Right frontal ~60m; ~375mlesions -appx 3-4 lesions; (right Frontal & Left parietal s/p GK x 2 lesions] ; June 2018- Progession vs radiation necrosis [July 2th-Add Avastin]  # [facility] 4 small subcentimeter brain lesions for which she had the Gamma knife. The main right frontal lesion gotten smaller; improve edema.  #    --------------------------------------------------------------     MOLECULAR STUDIES:  KRAS MUTATED/Tissues not sufficient for OTHER the molecular marker; will need repeat Bx ; GUARDIANT TESTING [[facility]]- pending. # [phone]]    Primary cancer of right lower lobe of lung ([place]    INTERVAL HISTORY:  Deanna Balla89.o.   female pleasant patient above history of Recurrent Metastatic adenocarcinoma the lung; And brain metastases- Currently on Avastin maintenance is here for follow-up.   [facility]-which unfortunately showed progressive consolidation-progressive malignancy versus infection.  This led to bronchoscopy.  Patient also had MRI of the brain  Patient denies any worsening shortness of breath or cough.  Denies any chest pain.  No back pain.  No headaches.  Not on steroids.  Denies any swelling in the legs.   REVIEW OF SYSTEMS:  A complete 10 point review of system is done which is negative except mentioned above/history of present illness.   PAST MEDICAL HISTORY :  Past Medical History:  Diagnosis Date  . Anxiety   . [place]  . Depression   . Depression with anxiety    Well controlled with Wellbutrin and Buspar  . Pneumonia     PAST SURGICAL HISTORY :   Past Surgical History:  Procedure Laterality Date  . ECTOPIC PREGNANCY SURGERY  1988  . ELECTROMAGNETIC NAVIGATION BROCHOSCOPY N/A 02/10/2015   Procedure: ELECTROMAGNETIC NAVIGATION BRONCHOSCOPY;  Surgeon: KuFlora LippsMD;  Location: [facility] ORS;  Service: Cardiopulmonary;  Laterality: N/A;  . ENDOBRONCHIAL ULTRASOUND N/A 02/10/2015   Procedure: ENDOBRONCHIAL ULTRASOUND;  Surgeon: KuFlora LippsMD;  Location: [facility] ORS;  Service: Cardiopulmonary;  Laterality: N/A;  . PERIPHERAL VASCULAR CATHETERIZATION N/A 03/05/2015   Procedure: PoGlori Luisath Insertion;  Surgeon: GrKatha CabalMD;  [facility];  Service: Cardiovascular;  Laterality: N/A;    FAMILY  HISTORY :   Family History  Problem Relation Age of Onset  . Cancer Father        Prostate Cancer  . Hypertension Father   . Stroke Father   . Hypertension Brother     SOCIAL HISTORY:   Social History   Tobacco Use  . Smoking status: Former Smoker    Packs/day: 0.25     Years: 30.00    Pack years: 7.50    Types: Cigarettes    Last attempt to quit: 02/06/2005    Years since quitting: 11.9  . Smokeless tobacco: Never Used  Substance Use Topics  . Alcohol use: Yes    Alcohol/week: 3.0 oz    Types: 5 Cans of beer per week    Comment: 5 beers weekly  . Drug use: No    ALLERGIES:  has No Known Allergies.  MEDICATIONS:  Current Outpatient Medications  Medication Sig Dispense Refill  . acetaminophen (TYLENOL) 500 MG tablet Take 1,000 mg by mouth every 4 (four) hours as needed.     . busPIRone (BUSPAR) 5 MG tablet Take 1 tablet (5 mg total) by mouth 2 (two) times daily. 60 tablet 6  . cyclobenzaprine (FLEXERIL) 5 MG tablet Take 5 mg by mouth 3 (three) times daily as needed for muscle spasms.    [name] Kitchen docusate sodium (COLACE) 100 MG capsule Take 100 mg by mouth 2 (two) times daily.    [name] Kitchen gabapentin (NEURONTIN) 100 MG capsule Take 2 capsules (200 mg total) by mouth 3 (three) times daily. 180 capsule 3  . lidocaine-prilocaine (EMLA) cream Apply 1 application topically as needed. 30 g 6  . ondansetron (ZOFRAN) 8 MG tablet Take 1 tablet (8 mg total) by mouth every 8 (eight) hours as needed for nausea or vomiting (start 3 days; after chemo). 40 tablet 1  . Oxycodone HCl 10 MG TABS Take 1 tablet (10 mg total) by mouth every 6 (six) hours. 60 tablet 0  . prochlorperazine (COMPAZINE) 10 MG tablet Take 1 tablet (10 mg total) by mouth every 6 (six) hours as needed for nausea or vomiting. 25 tablet 1  . SPIRIVA RESPIMAT 1.25 MCG/ACT AERS Inhale 1 puff into the lungs daily.     . SYMBICORT 80-4.5 MCG/ACT inhaler Inhale 1 puff into the lungs every 12 (twelve) hours. 1 Inhaler 1  . zolpidem (AMBIEN) 5 MG tablet TAKE 1 TABLET BY MOUTH AT BEDTIME AS NEEDED FOR SLEEP 30 tablet 2   No current facility-administered medications for this visit.     PHYSICAL EXAMINATION: ECOG PERFORMANCE STATUS: 0 - Asymptomatic  BP (!) 141/91 (BP Location: Left Arm, Patient Position: Sitting)    Pulse 69   Temp 97.8 F (36.6 C) (Tympanic)   Resp 16   Wt 131 lb (59.4 kg)   LMP 06/16/2000 (Approximate) Comment: 15 yrs ago  BMI 23.96 kg/m   Filed Weights   01/24/17 0921 01/24/17 0929  Weight: 126 lb 9.6 oz (57.4 kg) 131 lb (59.4 kg)    GENERAL: Well-nourished well-developed; Alert, no distress and comfortable. She is accompanied by her husband.  EYES: no pallor or icterus OROPHARYNX: no thrush or ulceration; good dentition  NECK: supple, no masses felt LYMPH:  no palpable lymphadenopathy in the cervical, axillary or inguinal regions LUNGS: clear to auscultation and  No wheeze or crackles HEART/CVS: regular rate & rhythm and no murmurs; No lower extremity edema;No erythema. ABDOMEN:abdomen soft, non-tender and normal bowel sounds Musculoskeletal:no cyanosis of digits and no clubbing  PSYCH: alert &  oriented x 3 with fluent speech.  NEURO: no focal motor/sensory deficits SKIN:  no rashes or significant lesions  LABORATORY DATA:  I have reviewed the data as listed    Component Value Date/Time   NA 138 01/24/2017 0856   NA 139 01/03/2015 0910   K 3.6 01/24/2017 0856   CL 104 01/24/2017 0856   CO2 23 01/24/2017 0856   GLUCOSE 108 (H) 01/24/2017 0856   BUN 15 01/24/2017 0856   BUN 10 01/03/2015 0910   CREATININE 0.74 01/24/2017 0856   CALCIUM 9.6 01/24/2017 0856   PROT 7.2 01/24/2017 0856   PROT 6.5 01/03/2015 0910   ALBUMIN 4.0 01/24/2017 0856   ALBUMIN 3.8 01/03/2015 0910   AST 20 01/24/2017 0856   ALT 9 (L) 01/24/2017 0856   ALKPHOS 49 01/24/2017 0856   BILITOT 0.5 01/24/2017 0856   BILITOT <0.2 01/03/2015 0910   GFRNONAA >60 01/24/2017 0856   GFRAA >60 01/24/2017 0856    No results found for: SPEP, UPEP  Lab Results  Component Value Date   WBC 5.4 01/24/2017   NEUTROABS 3.8 01/24/2017   HGB 12.9 01/24/2017   HCT 36.6 01/24/2017   MCV 106.2 (H) 01/24/2017   PLT 287 01/24/2017      Chemistry      Component Value Date/Time   NA 138 01/24/2017  0856   NA 139 01/03/2015 0910   K 3.6 01/24/2017 0856   CL 104 01/24/2017 0856   CO2 23 01/24/2017 0856   BUN 15 01/24/2017 0856   BUN 10 01/03/2015 0910   CREATININE 0.74 01/24/2017 0856      Component Value Date/Time   CALCIUM 9.6 01/24/2017 0856   ALKPHOS 49 01/24/2017 0856   AST 20 01/24/2017 0856   ALT 9 (L) 01/24/2017 0856   BILITOT 0.5 01/24/2017 0856   BILITOT <0.2 01/03/2015 0910     09/14/2016 [compared to June 2018]  FINDINGS: There is a semisolid mass with underlying cystic changes and cavitation in the superior segment of the right lower lobe measuring slightly greater than 6 cm in size. This has not changed significantly since the prior outside examination. Peripheral groundglass and airspace infiltrates in the right lower lobe inferiorly are stable as are the groundglass infiltrates in the inferior right upper lobe, some of which contain cystic changes. Mild interlobular septal thickening in the right upper lobe persists.  2 mm lingular nodule (2/157) is unchanged. Right perihilar and lower lobe bronchial wall thickening is again noted. Right IJ Port-A-Cath tip in the upper RA. Old right rib fracture. Mild emphysematous changes in the upper lungs. No thoracic adenopathy by size criteria. ---------------------------------------------------------- 09/14/2016- IMPRESSION:  No CT evidence of metastatic disease in the abdomen and pelvis.   RADIOGRAPHIC STUDIES: I have personally reviewed the radiological images as listed and agreed with the findings in the report. No results found.   ASSESSMENT & PLAN:  Primary cancer of right lower lobe of lung ([place]) T4 N2 M1-adenocarcinoma of the lung right side-on cabo-Taxol- Avastin. JAN 9th CT Chest [facility] clinic] Progression  Of lung disease vs ? Infection  [less likley]; MRI Brain-improved right frontal lesion; new 2 mm lesion noted in right paietal lobe.   # continue asvatin today; discused re: Ram-Tax; starting next cycle/ 3  weeks; if molecular testing at [facility] unrevealing for any targeted mutations.  I personally called [facility]; left a message for Dr. Malachi Paradise.    #Discussed the potential side effects of chemotherapy-Taxotere-myalgias neuropathy; steroid premedications etc.  #  Symptomatic brain metastases- s/p GK in [facility]; but new 2 mm lesion noted; No GK; to be monitored.   # PN-G-1.  Sec to taxol; neurotin TID-improving.  # follow up in 3-wed weeks; Tax-Ram [facility] on [facility]]; labs/MD; UA.   Orders Placed This Encounter  Procedures  . CBC with Differential/Platelet    Standing Status:   Future    Standing Expiration Date:   01/24/2018  . Comprehensive metabolic panel    Standing Status:   Future    Standing Expiration Date:   01/24/2018  . Urinalysis, Complete w Microscopic    Standing Status:   Future    Standing Expiration Date:   01/24/2018     Cammie Sickle, MD 01/25/2017 1:29 PM

## 2017-01-27 ENCOUNTER — Encounter: Payer: Self-pay | Admitting: Physician Assistant

## 2017-01-27 ENCOUNTER — Ambulatory Visit (INDEPENDENT_AMBULATORY_CARE_PROVIDER_SITE_OTHER): Payer: BLUE CROSS/BLUE SHIELD | Admitting: Internal Medicine

## 2017-01-27 ENCOUNTER — Ambulatory Visit (INDEPENDENT_AMBULATORY_CARE_PROVIDER_SITE_OTHER): Payer: BLUE CROSS/BLUE SHIELD | Admitting: Physician Assistant

## 2017-01-27 ENCOUNTER — Encounter: Payer: Self-pay | Admitting: Internal Medicine

## 2017-01-27 VITALS — BP 162/106 | HR 66 | Resp 16 | Ht 63.0 in | Wt 129.8 lb

## 2017-01-27 DIAGNOSIS — R0602 Shortness of breath: Secondary | ICD-10-CM

## 2017-01-27 DIAGNOSIS — F411 Generalized anxiety disorder: Secondary | ICD-10-CM

## 2017-01-27 DIAGNOSIS — J449 Chronic obstructive pulmonary disease, unspecified: Secondary | ICD-10-CM | POA: Diagnosis not present

## 2017-01-27 DIAGNOSIS — C3491 Malignant neoplasm of unspecified part of right bronchus or lung: Secondary | ICD-10-CM

## 2017-01-27 MED ORDER — FLUTICASONE-UMECLIDIN-VILANT 100-62.5-25 MCG/INH IN AEPB: 1.0000 | INHALATION_SPRAY | Freq: Every day | RESPIRATORY_TRACT | 4 refills | Status: DC

## 2017-01-27 MED ORDER — BUSPIRONE HCL 5 MG PO TABS: 5.0000 mg | ORAL_TABLET | Freq: Two times a day (BID) | ORAL | 1 refills | Status: DC

## 2017-01-27 NOTE — Progress Notes (Signed)
[facility] [place], [place] [address]  Pulmonary Sleep Medicine  Office Visit Note  Patient Name: Deanna Blair DOB: 26-Oct-1955 MRN [id number]  Date of Service: 01/27/2017  Complaints/HPI:   She is doing about the same since her last visit.  She continues to go to the [facility] for her cancer treatment.  She also follows locally with [place].  She is doing fine overall she has little bit of cough no hemoptysis.  Has been having little bit of weight loss.  Denies any chest pain or palpitations.  She was seen recently by Oncology here and her chemotherapy was discussed in length at that time.  No other major issues noted for now.  ROS  General: (-) fever, (-) chills, (-) night sweats, (-) weakness Skin: (-) rashes, (-) itching,. Eyes: (-) visual changes, (-) redness, (-) itching. Nose and Sinuses: (-) nasal stuffiness or itchiness, (-) postnasal drip, (-) nosebleeds, (-) sinus trouble. Mouth and Throat: (-) sore throat, (-) hoarseness. Neck: (-) swollen glands, (-) enlarged thyroid, (-) neck pain. Respiratory: - cough, (-) bloody sputum, - shortness of breath, - wheezing. Cardiovascular: - ankle swelling, (-) chest pain. Lymphatic: (-) lymph node enlargement. Neurologic: (-) numbness, (-) tingling. Psychiatric: (-) anxiety, (-) depression   Current Medication: Outpatient Encounter Medications as of 01/27/2017  Medication Sig  . acetaminophen (TYLENOL) 500 MG tablet Take 1,000 mg by mouth every 4 (four) hours as needed.   . busPIRone (BUSPAR) 5 MG tablet Take 1 tablet (5 mg total) by mouth 2 (two) times daily.  . cyclobenzaprine (FLEXERIL) 5 MG tablet Take 5 mg by mouth 3 (three) times daily as needed for muscle spasms.  [name] Kitchen docusate sodium (COLACE) 100 MG capsule Take 100 mg by mouth 2 (two) times daily.  [name] Kitchen gabapentin (NEURONTIN) 100 MG capsule Take 2 capsules (200 mg total) by mouth 3 (three) times daily.  [name] Kitchen lidocaine-prilocaine (EMLA)  cream Apply 1 application topically as needed.  . ondansetron (ZOFRAN) 8 MG tablet Take 1 tablet (8 mg total) by mouth every 8 (eight) hours as needed for nausea or vomiting (start 3 days; after chemo).  . Oxycodone HCl 10 MG TABS Take 1 tablet (10 mg total) by mouth every 6 (six) hours.  . prochlorperazine (COMPAZINE) 10 MG tablet Take 1 tablet (10 mg total) by mouth every 6 (six) hours as needed for nausea or vomiting.  [name] Kitchen SPIRIVA RESPIMAT 1.25 MCG/ACT AERS Inhale 1 puff into the lungs daily.   . SYMBICORT 80-4.5 MCG/ACT inhaler Inhale 1 puff into the lungs every 12 (twelve) hours.  [name] Kitchen zolpidem (AMBIEN) 5 MG tablet TAKE 1 TABLET BY MOUTH AT BEDTIME AS NEEDED FOR SLEEP   No facility-administered encounter medications on file as of 01/27/2017.     Surgical History: Past Surgical History:  Procedure Laterality Date  . ECTOPIC PREGNANCY SURGERY  1988  . ELECTROMAGNETIC NAVIGATION BROCHOSCOPY N/A 02/10/2015   Procedure: ELECTROMAGNETIC NAVIGATION BRONCHOSCOPY;  Surgeon: Flora Lipps, MD;  Location: [facility] ORS;  Service: Cardiopulmonary;  Laterality: N/A;  . ENDOBRONCHIAL ULTRASOUND N/A 02/10/2015   Procedure: ENDOBRONCHIAL ULTRASOUND;  Surgeon: Flora Lipps, MD;  Location: [facility] ORS;  Service: Cardiopulmonary;  Laterality: N/A;  . PERIPHERAL VASCULAR CATHETERIZATION N/A 03/05/2015   Procedure: Glori Luis Cath Insertion;  Surgeon: Katha Cabal, MD;  Location: [facility];  Service: Cardiovascular;  Laterality: N/A;    Medical History: Past Medical History:  Diagnosis Date  . Anxiety   . Cancer of right lung ([place]) 02/13/2015,  01/13/17  . Depression   . Depression with anxiety    Well controlled with Wellbutrin and Buspar  . Pneumonia     Family History: Family History  Problem Relation Age of Onset  . Cancer Father        Prostate Cancer  . Hypertension Father   . Stroke Father   . Hypertension Brother     Social History: Social History   Socioeconomic History  . Marital status:  Married    Spouse name: Herbie Baltimore  . Number of children: 1  . Years of education: 90  . Highest education level: Not on file  Social Needs  . Financial resource strain: Not on file  . Food insecurity - worry: Not on file  . Food insecurity - inability: Not on file  . Transportation needs - medical: Not on file  . Transportation needs - non-medical: Not on file  Occupational History  . 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 Smoking status: Former Smoker    Packs/day: 0.25    Years: 30.00    Pack years: 7.50    Types: Cigarettes    Last attempt to quit: 02/06/2005    Years since quitting: 11.9  . Smokeless tobacco: Never Used  Substance and Sexual Activity  . Alcohol use: Yes    Alcohol/week: 3.0 oz    Types: 5 Cans of beer per week    Comment: 5 beers weekly  . Drug use: No  . Sexual activity: Yes    Partners: Male  Other Topics Concern  . Not on file  Social History Narrative   [name] was born in [place], [place]. She moved with her family with [facility] and lived there for 13 years. She recently moved to [place] approximately 1 year ago. She lives with her husband of 28 years. They have an adult daughter who lives in [place]. 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 She is enjoying driving around and getting to know [place]. She and her husband enjoy cooking and baking. They also enjoy hiking when the weather permits.    Vital Signs: Blood pressure (!) 162/106, pulse 66, resp. rate 16, height _0  (1.6 m), weight 129 lb 12.8 oz (58.9 kg), last menstrual period 06/16/2000, SpO2 97 %.  Examination: General Appearance: The patient is well-developed, well-nourished, and in no distress. Skin: Gross inspection of skin unremarkable. Head: normocephalic, no gross deformities. Eyes: no gross deformities noted. ENT: ears appear grossly normal no exudates. Neck: Supple. No thyromegaly. No LAD. Respiratory: no  rhonchi noted. Cardiovascular: Normal S1 and S2 without murmur or rub. Extremities: No cyanosis. pulses are equal. Neurologic: Alert and oriented. No involuntary movements.  LABS: Recent Results (from the past 2160 hour(s))  Comprehensive metabolic panel     Status: Abnormal   Collection Time: 11/10/16  9:10 AM  Result Value Ref Range   Sodium 138 135 - 145 mmol/L   Potassium 3.5 3.5 - 5.1 mmol/L   Chloride 104 101 - 111 mmol/L   CO2 23 22 - 32 mmol/L   Glucose, Bld 115 (H) 65 - 99 mg/dL   BUN 12 6 - 20 mg/dL   Creatinine, Ser 0.70 0.44 - 1.00 mg/dL   Calcium 9.8 8.9 - 10.3 mg/dL   Total Protein 7.6 6.5 - 8.1 g/dL   Albumin 4.2 3.5 - 5.0 g/dL   AST 23 15 - 41 U/L   ALT 10 (  L) 14 - 54 U/L   Alkaline Phosphatase 55 38 - 126 U/L   Total Bilirubin 0.6 0.3 - 1.2 mg/dL   GFR calc non Af Amer >60 >60 mL/min   GFR calc Af Amer >60 >60 mL/min    Comment: (NOTE) The eGFR has been calculated using the CKD EPI equation. This calculation has not been validated in all clinical situations. eGFR's persistently <60 mL/min signify possible Chronic Kidney Disease.    Anion gap 11 5 - 15  CBC with Differential     Status: Abnormal   Collection Time: 11/10/16  9:10 AM  Result Value Ref Range   WBC 5.6 3.6 - 11.0 K/uL   RBC 3.06 (L) 3.80 - 5.20 MIL/uL   Hemoglobin 11.6 (L) 12.0 - 16.0 g/dL   HCT 33.6 (L) 35.0 - 47.0 %   MCV 109.6 (H) 80.0 - 100.0 fL   MCH 38.0 (H) 26.0 - 34.0 pg   MCHC 34.7 32.0 - 36.0 g/dL   RDW 18.9 (H) 11.5 - 14.5 %   Platelets 227 150 - 440 K/uL   Neutrophils Relative % 69 %   Neutro Abs 3.9 1.4 - 6.5 K/uL   Lymphocytes Relative 19 %   Lymphs Abs 1.1 1.0 - 3.6 K/uL   Monocytes Relative 9 %   Monocytes Absolute 0.5 0.2 - 0.9 K/uL   Eosinophils Relative 2 %   Eosinophils Absolute 0.1 0 - 0.7 K/uL   Basophils Relative 1 %   Basophils Absolute 0.0 0 - 0.1 K/uL  Urinalysis, Complete w Microscopic     Status: Abnormal   Collection Time: 11/10/16 10:30 AM  Result  Value Ref Range   Color, Urine YELLOW (A) YELLOW   APPearance CLOUDY (A) CLEAR   Specific Gravity, Urine 1.027 1.005 - 1.030   pH 5.0 5.0 - 8.0   Glucose, UA NEGATIVE NEGATIVE mg/dL   Hgb urine dipstick SMALL (A) NEGATIVE   Bilirubin Urine NEGATIVE NEGATIVE   Ketones, ur 5 (A) NEGATIVE mg/dL   Protein, ur 30 (A) NEGATIVE mg/dL   Nitrite NEGATIVE NEGATIVE   Leukocytes, UA SMALL (A) NEGATIVE   RBC / HPF 0-5 0 - 5 RBC/hpf   WBC, UA 6-30 0 - 5 WBC/hpf   Bacteria, UA RARE (A) NONE SEEN   Squamous Epithelial / LPF TOO NUMEROUS TO COUNT (A) NONE SEEN   Mucus PRESENT    Hyaline Casts, UA PRESENT   Comprehensive metabolic panel     Status: Abnormal   Collection Time: 12/01/16  8:21 AM  Result Value Ref Range   Sodium 138 135 - 145 mmol/L   Potassium 4.1 3.5 - 5.1 mmol/L   Chloride 104 101 - 111 mmol/L   CO2 24 22 - 32 mmol/L   Glucose, Bld 102 (H) 65 - 99 mg/dL   BUN 17 6 - 20 mg/dL   Creatinine, Ser 0.91 0.44 - 1.00 mg/dL   Calcium 9.6 8.9 - 10.3 mg/dL   Total Protein 7.3 6.5 - 8.1 g/dL   Albumin 4.3 3.5 - 5.0 g/dL   AST 24 15 - 41 U/L   ALT 10 (L) 14 - 54 U/L   Alkaline Phosphatase 49 38 - 126 U/L   Total Bilirubin 0.7 0.3 - 1.2 mg/dL   GFR calc non Af Amer >60 >60 mL/min   GFR calc Af Amer >60 >60 mL/min    Comment: (NOTE) The eGFR has been calculated using the CKD EPI equation. This calculation has not been validated in all  clinical situations. eGFR's persistently <60 mL/min signify possible Chronic Kidney Disease.    Anion gap 10 5 - 15  CBC with Differential/Platelet     Status: Abnormal   Collection Time: 12/01/16  8:21 AM  Result Value Ref Range   WBC 5.4 3.6 - 11.0 K/uL   RBC 3.26 (L) 3.80 - 5.20 MIL/uL   Hemoglobin 12.3 12.0 - 16.0 g/dL   HCT 35.7 35.0 - 47.0 %   MCV 109.4 (H) 80.0 - 100.0 fL   MCH 37.8 (H) 26.0 - 34.0 pg   MCHC 34.6 32.0 - 36.0 g/dL   RDW 15.2 (H) 11.5 - 14.5 %   Platelets 215 150 - 440 K/uL   Neutrophils Relative % 65 %   Neutro Abs 3.6 1.4  - 6.5 K/uL   Lymphocytes Relative 23 %   Lymphs Abs 1.2 1.0 - 3.6 K/uL   Monocytes Relative 8 %   Monocytes Absolute 0.4 0.2 - 0.9 K/uL   Eosinophils Relative 3 %   Eosinophils Absolute 0.1 0 - 0.7 K/uL   Basophils Relative 1 %   Basophils Absolute 0.0 0 - 0.1 K/uL  Urinalysis, Complete w Microscopic     Status: Abnormal   Collection Time: 12/01/16  9:38 AM  Result Value Ref Range   Color, Urine AMBER (A) YELLOW    Comment: BIOCHEMICALS MAY BE AFFECTED BY COLOR   APPearance HAZY (A) CLEAR   Specific Gravity, Urine 1.021 1.005 - 1.030   pH 5.0 5.0 - 8.0   Glucose, UA NEGATIVE NEGATIVE mg/dL   Hgb urine dipstick NEGATIVE NEGATIVE   Bilirubin Urine NEGATIVE NEGATIVE   Ketones, ur NEGATIVE NEGATIVE mg/dL   Protein, ur 30 (A) NEGATIVE mg/dL   Nitrite NEGATIVE NEGATIVE   Leukocytes, UA NEGATIVE NEGATIVE   RBC / HPF 0-5 0 - 5 RBC/hpf   WBC, UA 0-5 0 - 5 WBC/hpf   Bacteria, UA NONE SEEN NONE SEEN   Squamous Epithelial / LPF 0-5 (A) NONE SEEN   Mucus PRESENT    Hyaline Casts, UA PRESENT   Comprehensive metabolic panel     Status: Abnormal   Collection Time: 12/22/16  8:48 AM  Result Value Ref Range   Sodium 136 135 - 145 mmol/L   Potassium 3.9 3.5 - 5.1 mmol/L   Chloride 103 101 - 111 mmol/L   CO2 23 22 - 32 mmol/L   Glucose, Bld 102 (H) 65 - 99 mg/dL   BUN 15 6 - 20 mg/dL   Creatinine, Ser 0.70 0.44 - 1.00 mg/dL   Calcium 9.1 8.9 - 10.3 mg/dL   Total Protein 7.0 6.5 - 8.1 g/dL   Albumin 4.0 3.5 - 5.0 g/dL   AST 22 15 - 41 U/L   ALT 10 (L) 14 - 54 U/L   Alkaline Phosphatase 49 38 - 126 U/L   Total Bilirubin 0.5 0.3 - 1.2 mg/dL   GFR calc non Af Amer >60 >60 mL/min   GFR calc Af Amer >60 >60 mL/min    Comment: (NOTE) The eGFR has been calculated using the CKD EPI equation. This calculation has not been validated in all clinical situations. eGFR's persistently <60 mL/min signify possible Chronic Kidney Disease.    Anion gap 10 5 - 15  CBC with Differential     Status:  Abnormal   Collection Time: 12/22/16  8:48 AM  Result Value Ref Range   WBC 4.9 3.6 - 11.0 K/uL   RBC 3.30 (L) 3.80 - 5.20 MIL/uL  Hemoglobin 12.4 12.0 - 16.0 g/dL   HCT 35.8 35.0 - 47.0 %   MCV 108.5 (H) 80.0 - 100.0 fL   MCH 37.6 (H) 26.0 - 34.0 pg   MCHC 34.7 32.0 - 36.0 g/dL   RDW 13.0 11.5 - 14.5 %   Platelets 231 150 - 440 K/uL   Neutrophils Relative % 63 %   Neutro Abs 3.1 1.4 - 6.5 K/uL   Lymphocytes Relative 24 %   Lymphs Abs 1.2 1.0 - 3.6 K/uL   Monocytes Relative 9 %   Monocytes Absolute 0.5 0.2 - 0.9 K/uL   Eosinophils Relative 3 %   Eosinophils Absolute 0.1 0 - 0.7 K/uL   Basophils Relative 1 %   Basophils Absolute 0.0 0 - 0.1 K/uL  Urinalysis, Complete w Microscopic     Status: Abnormal   Collection Time: 12/22/16  9:41 AM  Result Value Ref Range   Color, Urine YELLOW (A) YELLOW   APPearance HAZY (A) CLEAR   Specific Gravity, Urine 1.008 1.005 - 1.030   pH 6.0 5.0 - 8.0   Glucose, UA NEGATIVE NEGATIVE mg/dL   Hgb urine dipstick SMALL (A) NEGATIVE   Bilirubin Urine NEGATIVE NEGATIVE   Ketones, ur NEGATIVE NEGATIVE mg/dL   Protein, ur NEGATIVE NEGATIVE mg/dL   Nitrite NEGATIVE NEGATIVE   Leukocytes, UA MODERATE (A) NEGATIVE   RBC / HPF 0-5 0 - 5 RBC/hpf   WBC, UA TOO NUMEROUS TO COUNT 0 - 5 WBC/hpf   Bacteria, UA RARE (A) NONE SEEN   Squamous Epithelial / LPF 0-5 (A) NONE SEEN   Mucus PRESENT   CBC with Differential     Status: Abnormal   Collection Time: 01/24/17  8:56 AM  Result Value Ref Range   WBC 5.4 3.6 - 11.0 K/uL   RBC 3.44 (L) 3.80 - 5.20 MIL/uL   Hemoglobin 12.9 12.0 - 16.0 g/dL   HCT 36.6 35.0 - 47.0 %   MCV 106.2 (H) 80.0 - 100.0 fL   MCH 37.4 (H) 26.0 - 34.0 pg   MCHC 35.2 32.0 - 36.0 g/dL   RDW 12.8 11.5 - 14.5 %   Platelets 287 150 - 440 K/uL   Neutrophils Relative % 70 %   Neutro Abs 3.8 1.4 - 6.5 K/uL   Lymphocytes Relative 19 %   Lymphs Abs 1.0 1.0 - 3.6 K/uL   Monocytes Relative 8 %   Monocytes Absolute 0.4 0.2 - 0.9 K/uL    Eosinophils Relative 2 %   Eosinophils Absolute 0.1 0 - 0.7 K/uL   Basophils Relative 1 %   Basophils Absolute 0.0 0 - 0.1 K/uL    Comment: Performed at [facility], [address] [place], [place] [address]  Comprehensive metabolic panel     Status: Abnormal   Collection Time: 01/24/17  8:56 AM  Result Value Ref Range   Sodium 138 135 - 145 mmol/L   Potassium 3.6 3.5 - 5.1 mmol/L   Chloride 104 101 - 111 mmol/L   CO2 23 22 - 32 mmol/L   Glucose, Bld 108 (H) 65 - 99 mg/dL   BUN 15 6 - 20 mg/dL   Creatinine, Ser 0.74 0.44 - 1.00 mg/dL   Calcium 9.6 8.9 - 10.3 mg/dL   Total Protein 7.2 6.5 - 8.1 g/dL   Albumin 4.0 3.5 - 5.0 g/dL   AST 20 15 - 41 U/L   ALT 9 (L) 14 - 54 U/L   Alkaline Phosphatase 49 38 - 126  U/L   Total Bilirubin 0.5 0.3 - 1.2 mg/dL   GFR calc non Af Amer >60 >60 mL/min   GFR calc Af Amer >60 >60 mL/min    Comment: (NOTE) The eGFR has been calculated using the CKD EPI equation. This calculation has not been validated in all clinical situations. eGFR's persistently <60 mL/min signify possible Chronic Kidney Disease.    Anion gap 11 5 - 15    Comment: Performed at [facility], [address] [place], [place] [address]  Urinalysis, Complete w Microscopic     Status: Abnormal   Collection Time: 01/24/17 11:20 AM  Result Value Ref Range   Color, Urine YELLOW (A) YELLOW   APPearance HAZY (A) CLEAR   Specific Gravity, Urine 1.025 1.005 - 1.030   pH 5.0 5.0 - 8.0   Glucose, UA NEGATIVE NEGATIVE mg/dL   Hgb urine dipstick NEGATIVE NEGATIVE   Bilirubin Urine NEGATIVE NEGATIVE   Ketones, ur NEGATIVE NEGATIVE mg/dL   Protein, ur NEGATIVE NEGATIVE mg/dL   Nitrite POSITIVE (A) NEGATIVE   Leukocytes, UA NEGATIVE NEGATIVE   RBC / HPF NONE SEEN 0 - 5 RBC/hpf   WBC, UA 6-30 0 - 5 WBC/hpf   Bacteria, UA FEW (A) NONE SEEN   Squamous Epithelial / LPF 0-5 (A) NONE SEEN   Mucus PRESENT     Comment: Performed at [facility], [address],  [place], [place] [address]    Radiology: US Venous Img Lower Bilateral  Result Date: 12/01/2016 CLINICAL DATA:  Lower extremity swelling, left greater than right EXAM: BILATERAL LOWER EXTREMITY VENOUS DOPPLER ULTRASOUND TECHNIQUE: Gray-scale sonography with graded compression, as well as color Doppler and duplex ultrasound were performed to evaluate the lower extremity deep venous systems from the level of the common femoral vein and including the common femoral, femoral, profunda femoral, popliteal and calf veins including the posterior tibial, peroneal and gastrocnemius veins when visible. The superficial great saphenous vein was also interrogated. Spectral Doppler was utilized to evaluate flow at rest and with distal augmentation maneuvers in the common femoral, femoral and popliteal veins. COMPARISON:  None. FINDINGS: RIGHT LOWER EXTREMITY Common Femoral Vein: No evidence of thrombus. Normal compressibility, respiratory phasicity and response to augmentation. Saphenofemoral Junction: No evidence of thrombus. Normal compressibility and flow on color Doppler imaging. Profunda Femoral Vein: No evidence of thrombus. Normal compressibility and flow on color Doppler imaging. Femoral Vein: No evidence of thrombus. Normal compressibility, respiratory phasicity and response to augmentation. Popliteal Vein: No evidence of thrombus. Normal compressibility, respiratory phasicity and response to augmentation. Calf Veins: No evidence of thrombus. Normal compressibility and flow on color Doppler imaging. Superficial Great Saphenous Vein: No evidence of thrombus. Normal compressibility. Venous Reflux:  None. Other Findings:  None. LEFT LOWER EXTREMITY Common Femoral Vein: No evidence of thrombus. Normal compressibility, respiratory phasicity and response to augmentation. Saphenofemoral Junction: No evidence of thrombus. Normal compressibility and flow on color Doppler imaging. Profunda Femoral Vein: No evidence of thrombus.  Normal compressibility and flow on color Doppler imaging. Femoral Vein: No evidence of thrombus. Normal compressibility, respiratory phasicity and response to augmentation. Popliteal Vein: No evidence of thrombus. Normal compressibility, respiratory phasicity and response to augmentation. Calf Veins: No evidence of thrombus. Normal compressibility and flow on color Doppler imaging. Superficial Great Saphenous Vein: No evidence of thrombus. Normal compressibility. Venous Reflux:  None. Other Findings:  None. IMPRESSION: No evidence of deep venous thrombosis. Electronically Signed   By: Jerilynn Mages.  Shick M.D.   On: 12/01/2016 12:30  No results found.  No results found.    Assessment and Plan: Patient Active Problem List   Diagnosis Date Noted  . Leg pain, bilateral 12/01/2016  . Seborrheic keratosis 11/18/2016  . Cerebral edema ([place]) 06/21/2016  . Counseling regarding goals of care 01/28/2016  . Brain metastasis ([place]) 01/07/2016  . Malignant neoplasm of upper lobe, unspecified bronchus or lung ([place]) 10/09/2015  . Primary cancer of right lower lobe of lung ([place]) 10/08/2015  . Encounter for antineoplastic chemotherapy 06/24/2015  . Adenopathy   . 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 Cardiac enlargement 01/03/2015  . SOB (shortness of breath) 01/02/2015  . History of tobacco abuse 12/20/2014  . Personal history of nicotine dependence 12/20/2014  . History of cervical cancer 12/11/2014  . Tobacco abuse counseling 12/26/2012    1. Adenocarcinoma of the lung She is following oncology in [facility] and also in [facility] clinic Ongoing chemo planned  2. COPD She is not able to afford the spiriva I suggested we do trelogy in place Will need follow up PFT  3. SOB She will be scheduled for PFT     General Counseling: I have discussed the findings of the evaluation and examination with Mariann Laster.  I have also discussed any further diagnostic evaluation thatmay be needed or  ordered today. Natesha verbalizes understanding of the findings of todays visit. We also reviewed her medications today and discussed drug interactions and side effects including but not limited excessive drowsiness and altered mental states. We also discussed that there is always a risk not just to her but also people around her. she has been encouraged to call the office with any questions or concerns that should arise related to todays visit.    Time spent: 17mn  I have personally obtained a history, examined the patient, evaluated laboratory and imaging results, formulated the assessment and plan and placed orders.    SAllyne Gee MD FTrinitas Hospital - New Point CampusPulmonary and Critical Care Sleep medicine

## 2017-01-27 NOTE — Progress Notes (Signed)
Patient: Deanna Blair Female    DOB: 1955-11-06   62 y.o.   MRN: [id number] Visit Date: 01/27/2017  Today's Provider: Mar Daring, PA-C   Chief Complaint  Patient presents with  . Anxiety   Subjective:    HPI  Patient is here for follow up on medication and getting refills. Patient feels like she is doing well on the medication. She is taking Buspar 5 mg 1 twice daily.    Depression screen [facility] 2/9 01/27/2017 01/27/2017 06/11/2015  Decreased Interest 0 0 0  Down, Depressed, Hopeless 0 0 0  PHQ - 2 Score 0 0 0  Altered sleeping 1 - -  Tired, decreased energy 1 - -  Change in appetite 0 - -  Feeling bad or failure about yourself  0 - -  Trouble concentrating 0 - -  Moving slowly or fidgety/restless 0 - -  Suicidal thoughts 0 - -  PHQ-9 Score 2 - -  Difficult doing work/chores Not difficult at all - -   Patient has been seen Dr Humphrey Rolls for follow ups. Also has been seen Dr B oncologist.  No Known Allergies   Current Outpatient Medications:  .  acetaminophen (TYLENOL) 500 MG tablet, Take 1,000 mg by mouth every 4 (four) hours as needed. , Disp: , Rfl:  .  busPIRone (BUSPAR) 5 MG tablet, Take 1 tablet (5 mg total) by mouth 2 (two) times daily., Disp: 60 tablet, Rfl: 6 .  docusate sodium (COLACE) 100 MG capsule, Take 100 mg by mouth 2 (two) times daily., Disp: , Rfl:  .  gabapentin (NEURONTIN) 100 MG capsule, Take 2 capsules (200 mg total) by mouth 3 (three) times daily., Disp: 180 capsule, Rfl: 3 .  lidocaine-prilocaine (EMLA) cream, Apply 1 application topically as needed., Disp: 30 g, Rfl: 6 .  ondansetron (ZOFRAN) 8 MG tablet, Take 1 tablet (8 mg total) by mouth every 8 (eight) hours as needed for nausea or vomiting (start 3 days; after chemo)., Disp: 40 tablet, Rfl: 1 .  Oxycodone HCl 10 MG TABS, Take 1 tablet (10 mg total) by mouth every 6 (six) hours., Disp: 60 tablet, Rfl: 0 .  prochlorperazine (COMPAZINE) 10 MG tablet, Take 1 tablet (10 mg total) by  mouth every 6 (six) hours as needed for nausea or vomiting., Disp: 25 tablet, Rfl: 1 .  SPIRIVA RESPIMAT 1.25 MCG/ACT AERS, Inhale 1 puff into the lungs daily. , Disp: , Rfl:  .  SYMBICORT 80-4.5 MCG/ACT inhaler, Inhale 1 puff into the lungs every 12 (twelve) hours., Disp: 1 Inhaler, Rfl: 1 .  zolpidem (AMBIEN) 5 MG tablet, TAKE 1 TABLET BY MOUTH AT BEDTIME AS NEEDED FOR SLEEP, Disp: 30 tablet, Rfl: 2 .  Fluticasone-Umeclidin-Vilant (TRELEGY ELLIPTA) 100-62.5-25 MCG/INH AEPB, Inhale 1 puff into the lungs daily. (Patient not taking: Reported on 01/27/2017), Disp: 1 each, Rfl: 4  Review of Systems  Constitutional: Positive for fatigue.  Respiratory: Positive for cough and shortness of breath. Negative for choking, chest tightness and wheezing.   Cardiovascular: Positive for leg swelling. Negative for chest pain and palpitations.  Gastrointestinal: Negative for constipation, diarrhea and nausea.  Musculoskeletal: Positive for arthralgias, gait problem, joint swelling, myalgias and neck pain. Negative for back pain and neck stiffness.  Neurological: Positive for dizziness, weakness (left side), light-headedness and numbness. Negative for tremors and headaches.  Psychiatric/Behavioral: Positive for sleep disturbance. The patient is nervous/anxious (better on the medications.).     Social History   Tobacco  Use  . Smoking status: Former Smoker    Packs/day: 0.25    Years: 30.00    Pack years: 7.50    Types: Cigarettes    Last attempt to quit: 02/06/2005    Years since quitting: 11.9  . Smokeless tobacco: Never Used  Substance Use Topics  . Alcohol use: Yes    Alcohol/week: 3.0 oz    Types: 5 Cans of beer per week    Comment: 5 beers weekly   Objective:   BP 132/76   Pulse 64   Temp 97.9 F (36.6 C)   Resp 12   Wt 129 lb 12.8 oz (58.9 kg)   LMP 06/16/2000 (Approximate) Comment: 15 yrs ago  BMI 22.99 kg/m  Vitals:   01/27/17 1127  BP: 132/76  Pulse: 64  Resp: 12  Temp: 97.9 F  (36.6 C)  Weight: 129 lb 12.8 oz (58.9 kg)     Physical Exam  Constitutional: She appears well-developed and well-nourished. No distress.  Neck: Normal range of motion. Neck supple. No tracheal deviation present. No thyromegaly present.  Cardiovascular: Normal rate, regular rhythm and normal heart sounds. Exam reveals no gallop and no friction rub.  No murmur heard. Pulmonary/Chest: Effort normal and breath sounds normal. No respiratory distress. She has no wheezes. She has no rales.  Lymphadenopathy:    She has no cervical adenopathy.  Skin: She is not diaphoretic.  Vitals reviewed.       Assessment & Plan:     1. GAD (generalized anxiety disorder) Stable. Diagnosis pulled for medication refill. Continue current medical treatment plan. - busPIRone (BUSPAR) 5 MG tablet; Take 1 tablet (5 mg total) by mouth 2 (two) times daily.  Dispense: 180 tablet; Refill: Orin, PA-C  [facility]

## 2017-01-27 NOTE — Patient Instructions (Signed)
Lung Cancer Lung cancer occurs when abnormal cells in the lung grow out of control and form a mass (tumor). There are several types of lung cancer. The two most common types are:  Non-small cell. In this type of lung cancer, abnormal cells are larger and grow more slowly than those of small cell lung cancer.  Small cell. In this type of lung cancer, abnormal cells are smaller than those of non-small cell lung cancer. Small cell lung cancer gets worse faster than non-small cell lung cancer.  What are the causes? The leading cause of lung cancer is smoking tobacco. The second leading cause is radon exposure. What increases the risk?  Smoking tobacco.  Exposure to secondhand tobacco smoke.  Exposure to radon gas.  Exposure to asbestos.  Exposure to arsenic in drinking water.  Air pollution.  Family or personal history of lung cancer.  Lung radiation therapy.  Being older than 65 years. What are the signs or symptoms? In the early stages, symptoms may not be present. As the cancer progresses, symptoms may include:  A lasting cough, possibly with blood.  Fatigue.  Unexplained weight loss.  Shortness of breath.  Wheezing.  Chest pain.  Loss of appetite.  Symptoms of advanced lung cancer include:  Hoarseness.  Bone or joint pain.  Weakness.  Nail problems.  Face or arm swelling.  Paralysis of the face.  Drooping eyelids.  How is this diagnosed? Lung cancer can be identified with a physical exam and with tests such as:  A chest X-ray.  A CT scan.  Blood tests.  A biopsy.  After a diagnosis is made, you will have more tests to determine the stage of the cancer. The stages of non-small cell lung cancer are:  Stage 0, also called carcinoma in situ. At this stage, abnormal cells are found in the inner lining of your lung or lungs.  Stage I. At this stage, abnormal cells have grown into a tumor that is no larger than 5 cm across. The cancer has entered  the deeper lung tissue but has not yet entered the lymph nodes or other parts of the body.  Stage II. At this stage, the tumor is 7 cm across or smaller and has entered nearby lymph nodes. Or, the tumor is 5 cm across or smaller and has invaded surrounding tissue but is not found in nearby lymph nodes. There may be more than one tumor present.  Stage III. At this stage, the tumor may be any size. There may be more than one tumor in the lungs. The cancer cells have spread to the lymph nodes and possibly to other organs.  Stage IV. At this stage, there are tumors in both lungs and the cancer has spread to other areas of the body.  The stages of small cell lung cancer are:  Limited. At this stage, the cancer is found only on one side of the chest.  Extensive. At this stage, the cancer is in the lungs and in tissues on the other side of the chest. The cancer has spread to other organs or is found in the fluid between the layers of your lungs.  How is this treated? Depending on the type and stage of your lung cancer, you may be treated with:  Surgery. This is done to remove a tumor.  Radiation therapy. This treatment destroys cancer cells using X-rays or other types of radiation.  Chemotherapy. This treatment uses medicines to destroy cancer cells.  Targeted therapy. This treatment   aims to destroy only cancer cells instead of all cells as other therapies do.  You may also have a combination of treatments. Follow these instructions at home:  Do not use any tobacco products. This includes cigarettes, chewing tobacco, and electronic cigarettes. If you need help quitting, ask your health care provider.  Take medicines only as directed by your health care provider.  Eat a healthy diet. Work with a dietitian to make sure you are getting the nutrition you need.  Consider joining a support group or seeking counseling to help you cope with the stress of having lung cancer.  Let your cancer  specialist (oncologist) know if you are admitted to the hospital.  Keep all follow-up visits as directed by your health care provider. This is important. Contact a health care provider if:  You lose weight without trying.  You have a persistent cough and wheezing.  You feel short of breath.  You tire easily.  You experience bone or joint pain.  You have difficulty swallowing.  You feel hoarse or notice your voice changing.  Your pain medicine is not helping. Get help right away if:  You cough up blood.  You have new breathing problems.  You develop chest pain.  You develop swelling in: ? One or both ankles or legs. ? Your face, neck, or arms.  You are confused.  You experience paralysis in your face or a drooping eyelid. This information is not intended to replace advice given to you by your health care provider. Make sure you discuss any questions you have with your health care provider. Document Released: 03/29/2000 Document Revised: 05/29/2015 Document Reviewed: 04/26/2013 Elsevier Interactive Patient Education  2018 [employer]  

## 2017-01-28 ENCOUNTER — Telehealth: Payer: Self-pay | Admitting: Internal Medicine

## 2017-01-28 NOTE — Telephone Encounter (Signed)
I Spoke to Dr.Leventakos/[facility]- [facility] One results-still pending.  He will inform us when results are available.  Please inform patient of above. Thx

## 2017-01-28 NOTE — Telephone Encounter (Signed)
Patient notified of Dr. Aletha Halim comments and patient verbalized understanding. Patient thanked me for calling.

## 2017-02-09 ENCOUNTER — Other Ambulatory Visit: Payer: Self-pay | Admitting: Nurse Practitioner

## 2017-02-09 DIAGNOSIS — C3431 Malignant neoplasm of lower lobe, right bronchus or lung: Secondary | ICD-10-CM

## 2017-02-09 DIAGNOSIS — G893 Neoplasm related pain (acute) (chronic): Secondary | ICD-10-CM

## 2017-02-09 MED ORDER — OXYCODONE HCL 10 MG PO TABS: 10.0000 mg | ORAL_TABLET | Freq: Four times a day (QID) | ORAL | 0 refills | Status: DC

## 2017-02-14 ENCOUNTER — Other Ambulatory Visit: Payer: Self-pay | Admitting: Internal Medicine

## 2017-02-14 ENCOUNTER — Telehealth: Payer: Self-pay | Admitting: *Deleted

## 2017-02-14 ENCOUNTER — Telehealth: Payer: Self-pay | Admitting: Internal Medicine

## 2017-02-14 MED ORDER — DEXAMETHASONE 4 MG PO TABS: 4.0000 mg | ORAL_TABLET | Freq: Two times a day (BID) | ORAL | 3 refills | Status: DC

## 2017-02-14 NOTE — Progress Notes (Signed)
DISCONTINUE ON PATHWAY REGIMEN - Non-Small Cell Lung     A cycle is every 21 days:     Paclitaxel      Carboplatin   **Always confirm dose/schedule in your pharmacy ordering system**    REASON: Disease Progression PRIOR TREATMENT: [id number]: Carboplatin + Paclitaxel q21 Days x 4 Cycles TREATMENT RESPONSE: Progressive Disease (PD)  START ON PATHWAY REGIMEN - Non-Small Cell Lung     A cycle is every 21 days:     Ramucirumab      Docetaxel   **Always confirm dose/schedule in your pharmacy ordering system**    Patient Characteristics: Stage IV Metastatic, Nonsquamous, Third Line - Chemotherapy/Immunotherapy, PS = 0, 1, Prior PD-1/PD-L1 Inhibitor or No Prior PD-1/PD-L1 Inhibitor and Not a Candidate for Immunotherapy AJCC T Category: T4 Current Disease Status: Distant Metastases AJCC N Category: N2 AJCC M Category: M1a AJCC 8 Stage Grouping: IVA Histology: Nonsquamous Cell ROS1 Rearrangement Status: Negative T790M Mutation Status: Not Applicable - EGFR Mutation Negative/Unknown Other Mutations/Biomarkers: No Other Actionable Mutations PD-L1 Expression Status: Quantity Not Sufficient Chemotherapy/Immunotherapy LOT: Third Line Chemotherapy/Immunotherapy Molecular Targeted Therapy: Not Appropriate ALK Translocation Status: Positive Would you be surprised if this patient died  in the next year<= I would be surprised if this patient died in the next year EGFR Mutation Status: Negative/Wild Type BRAF V600E Mutation Status: Negative Performance Status: PS = 0, 1 Immunotherapy Candidate Status: Not a Candidate for Immunotherapy Prior Immunotherapy Status: Prior PD-1/PD-L1 Inhibitor Intent of Therapy: Non-Curative / Palliative Intent, Discussed with Patient

## 2017-02-14 NOTE — Telephone Encounter (Signed)
Patient called asking that Deanna Blair return her call regarding decision on what chemotherapy she is to get this week. Please return her call [phone]

## 2017-02-14 NOTE — Telephone Encounter (Signed)
Spoke to pt that F-one - no tagettable mutations. Recommend ram-tax; sent dex. Pt tearful; discussed re: clinical trails; she will check with insurance.

## 2017-02-14 NOTE — Telephone Encounter (Signed)
Per Dr. Jacinto Reap, he will personally contact the patient himself

## 2017-02-15 ENCOUNTER — Other Ambulatory Visit: Payer: Self-pay | Admitting: Internal Medicine

## 2017-02-17 ENCOUNTER — Other Ambulatory Visit: Payer: Self-pay

## 2017-02-17 ENCOUNTER — Inpatient Hospital Stay (HOSPITAL_BASED_OUTPATIENT_CLINIC_OR_DEPARTMENT_OTHER): Payer: BLUE CROSS/BLUE SHIELD | Admitting: Internal Medicine

## 2017-02-17 ENCOUNTER — Inpatient Hospital Stay: Payer: BLUE CROSS/BLUE SHIELD

## 2017-02-17 ENCOUNTER — Inpatient Hospital Stay: Payer: BLUE CROSS/BLUE SHIELD | Attending: Internal Medicine

## 2017-02-17 VITALS — BP 148/87 | HR 63 | Temp 97.9°F | Resp 20 | Ht 63.0 in | Wt 129.0 lb

## 2017-02-17 DIAGNOSIS — C3431 Malignant neoplasm of lower lobe, right bronchus or lung: Secondary | ICD-10-CM

## 2017-02-17 DIAGNOSIS — C7931 Secondary malignant neoplasm of brain: Secondary | ICD-10-CM | POA: Insufficient documentation

## 2017-02-17 DIAGNOSIS — Z8042 Family history of malignant neoplasm of prostate: Secondary | ICD-10-CM | POA: Insufficient documentation

## 2017-02-17 DIAGNOSIS — G620 Drug-induced polyneuropathy: Secondary | ICD-10-CM | POA: Diagnosis not present

## 2017-02-17 DIAGNOSIS — M2550 Pain in unspecified joint: Secondary | ICD-10-CM | POA: Insufficient documentation

## 2017-02-17 DIAGNOSIS — Z87891 Personal history of nicotine dependence: Secondary | ICD-10-CM

## 2017-02-17 DIAGNOSIS — G893 Neoplasm related pain (acute) (chronic): Secondary | ICD-10-CM | POA: Diagnosis not present

## 2017-02-17 DIAGNOSIS — Z5189 Encounter for other specified aftercare: Secondary | ICD-10-CM | POA: Insufficient documentation

## 2017-02-17 DIAGNOSIS — Z5112 Encounter for antineoplastic immunotherapy: Secondary | ICD-10-CM | POA: Insufficient documentation

## 2017-02-17 DIAGNOSIS — J029 Acute pharyngitis, unspecified: Secondary | ICD-10-CM | POA: Insufficient documentation

## 2017-02-17 DIAGNOSIS — Z5111 Encounter for antineoplastic chemotherapy: Secondary | ICD-10-CM | POA: Diagnosis present

## 2017-02-17 DIAGNOSIS — K1231 Oral mucositis (ulcerative) due to antineoplastic therapy: Secondary | ICD-10-CM | POA: Diagnosis not present

## 2017-02-17 LAB — CBC WITH DIFFERENTIAL/PLATELET
BASOS ABS: 0 10*3/uL (ref 0–0.1)
BASOS PCT: 0 %
EOS ABS: 0 10*3/uL (ref 0–0.7)
Eosinophils Relative: 0 %
HEMATOCRIT: 37.2 % (ref 35.0–47.0)
HEMOGLOBIN: 12.9 g/dL (ref 12.0–16.0)
Lymphocytes Relative: 13 %
Lymphs Abs: 0.8 10*3/uL — ABNORMAL LOW (ref 1.0–3.6)
MCH: 36.7 pg — ABNORMAL HIGH (ref 26.0–34.0)
MCHC: 34.6 g/dL (ref 32.0–36.0)
MCV: 106.2 fL — ABNORMAL HIGH (ref 80.0–100.0)
MONOS PCT: 8 %
Monocytes Absolute: 0.5 10*3/uL (ref 0.2–0.9)
NEUTROS ABS: 5.2 10*3/uL (ref 1.4–6.5)
NEUTROS PCT: 79 %
Platelets: 248 10*3/uL (ref 150–440)
RBC: 3.5 MIL/uL — AB (ref 3.80–5.20)
RDW: 13.3 % (ref 11.5–14.5)
WBC: 6.6 10*3/uL (ref 3.6–11.0)

## 2017-02-17 LAB — COMPREHENSIVE METABOLIC PANEL
ALK PHOS: 50 U/L (ref 38–126)
ALT: 10 U/L — AB (ref 14–54)
ANION GAP: 10 (ref 5–15)
AST: 27 U/L (ref 15–41)
Albumin: 4.1 g/dL (ref 3.5–5.0)
BILIRUBIN TOTAL: 0.7 mg/dL (ref 0.3–1.2)
BUN: 20 mg/dL (ref 6–20)
CALCIUM: 9.4 mg/dL (ref 8.9–10.3)
CO2: 23 mmol/L (ref 22–32)
CREATININE: 0.82 mg/dL (ref 0.44–1.00)
Chloride: 101 mmol/L (ref 101–111)
Glucose, Bld: 134 mg/dL — ABNORMAL HIGH (ref 65–99)
Potassium: 3.9 mmol/L (ref 3.5–5.1)
SODIUM: 134 mmol/L — AB (ref 135–145)
TOTAL PROTEIN: 7.5 g/dL (ref 6.5–8.1)

## 2017-02-17 LAB — URINALYSIS, COMPLETE (UACMP) WITH MICROSCOPIC
BILIRUBIN URINE: NEGATIVE
GLUCOSE, UA: NEGATIVE mg/dL
HGB URINE DIPSTICK: NEGATIVE
Ketones, ur: NEGATIVE mg/dL
Nitrite: NEGATIVE
Protein, ur: NEGATIVE mg/dL
SPECIFIC GRAVITY, URINE: 1.024 (ref 1.005–1.030)
pH: 5 (ref 5.0–8.0)

## 2017-02-17 MED ORDER — SODIUM CHLORIDE 0.9 % IV SOLN
Freq: Once | INTRAVENOUS | Status: AC
  Administered 2017-02-17: 10:00:00 via INTRAVENOUS
  Filled 2017-02-17: qty 1000

## 2017-02-17 MED ORDER — ACETAMINOPHEN 325 MG PO TABS
650.0000 mg | ORAL_TABLET | Freq: Once | ORAL | Status: AC
  Administered 2017-02-17: 650 mg via ORAL
  Filled 2017-02-17: qty 2

## 2017-02-17 MED ORDER — RAMUCIRUMAB CHEMO INJECTION 500 MG/50ML
10.0000 mg/kg | Freq: Once | INTRAVENOUS | Status: AC
  Administered 2017-02-17: 600 mg via INTRAVENOUS
  Filled 2017-02-17: qty 50

## 2017-02-17 MED ORDER — DEXAMETHASONE SODIUM PHOSPHATE 10 MG/ML IJ SOLN
10.0000 mg | Freq: Once | INTRAMUSCULAR | Status: AC
Start: 2017-02-17 — End: 2017-02-17
  Administered 2017-02-17: 10 mg via INTRAVENOUS
  Filled 2017-02-17: qty 1

## 2017-02-17 MED ORDER — SODIUM CHLORIDE 0.9 % IV SOLN
75.0000 mg/m2 | Freq: Once | INTRAVENOUS | Status: AC
  Administered 2017-02-17: 120 mg via INTRAVENOUS
  Filled 2017-02-17: qty 12

## 2017-02-17 MED ORDER — DIPHENHYDRAMINE HCL 50 MG/ML IJ SOLN
50.0000 mg | Freq: Once | INTRAMUSCULAR | Status: AC
  Administered 2017-02-17: 50 mg via INTRAVENOUS
  Filled 2017-02-17: qty 1

## 2017-02-17 MED ORDER — HEPARIN SOD (PORK) LOCK FLUSH 100 UNIT/ML IV SOLN
500.0000 [IU] | Freq: Once | INTRAVENOUS | Status: AC | PRN
  Administered 2017-02-17: 500 [IU]
  Filled 2017-02-17: qty 5

## 2017-02-17 MED ORDER — DEXAMETHASONE SODIUM PHOSPHATE 100 MG/10ML IJ SOLN: 10.0000 mg | Freq: Once | INTRAMUSCULAR | Status: DC

## 2017-02-17 NOTE — Assessment & Plan Note (Addendum)
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 JAN 9th CT Chest [facility] clinic] Progression  Of lung disease vs ? Infection  [less likley]; MRI Brain-improved right frontal lesion; new 2 mm lesion noted in right paietal lobe.   # start cycle #1 Ram-Tax today; as molecular testing at [facility] unrevealing for any targeted mutations. Spoke to [employer].  Patient visibly upset given the absence of any targetable mutation.  Also discussed regarding participation in clinical trials either at [facility].  Patient will check with insurance.  #Discussed the potential side effects of chemotherapy-Taxotere-myalgias neuropathy; steroid premedications etc. Also discussed re: diarrhea.   # Symptomatic brain metastases- s/p GK in [facility]; but new 2 mm lesion noted; No GK; to be monitored.   # PN-G-1.  Sec to taxol; neurotin TID-improving.  # Growth factor-Neulasta/On pro would be given as prophylaxis for chemotherapy-induced neutropenia to prevent febrile neutropenias. Discussed potential side effect- myalgias/arthralgias- recommend Claritin for 4 days.   # follow up in 3 weeks; Tax-Ram;  labs/MD; UA. 10 days- labs/NP; hold tube- symptom management.   # 40 minutes face-to-face with the patient discussing the above plan of care; more than 50% of time spent on prognosis/ natural history; counseling and coordination.

## 2017-02-17 NOTE — Progress Notes (Signed)
[facility] OFFICE PROGRESS NOTE  Patient Care Team: Mar Daring, PA-C as PCP - General (Family Medicine) Allyne Gee, MD as Referring Physician (Internal Medicine)  Malignant neoplasm of trachea, bronchus, and lung [facility])   Staging form: Lung, AJCC 7th Edition     Clinical: Stage IIIB (T4, N2, M0) - Signed by Cammie Sickle, MD on 06/24/2015    Oncology History   # FEB 2017-[place] CA Right Lower Lung T4N2M0- STAGE IIIB [Bronch & Subcarinal LNBx]- March 8th START CARBO-ALIMTA x4 cycles- June 8th 2017 - IMPROVED FDG MULTI-FOCAL lesions/N2-LN activity. S/p [place]-- Alimta x6  # AUG 18th- [place]- Avastin maintenance; SEP 13th DISCONTINUE AVASTIN [sec to cavitary lesion]; OCT 20th 2017- CT-STABLE Disease; NOV 8th CT/PET [facility]]- "overall slight progression-? Lymphangiectatic spread"  # Jan - April 2018Clement Husbands; progression  # April 25th 2018- [place]- Taxol x3 ccyles; June 2018- CT chest STABLE Disease;July 25th- Add Avastin to [place] taxol x6 cycles- Sep 2018-CT scan- [facility] STABLE   # Sep 2018- Avastin Maintence;   JAN 9th CT- right lung progression [s/p Bx- [place]; [place] [facility]; F-one- 1/15]  # NOV 2nd 2017-MRI, [facility]- Brain mets [asymptomatic;Right frontal ~74m; ~352mlesions -appx 3-4 lesions; (right Frontal & Left parietal s/p GK x 2 lesions] ; June 2018- Progession vs radiation necrosis [July 2th-Add Avastin]  [facility] 4 small subcentimeter brain lesions for which she had the Gamma knife. The main right frontal lesion gotten smaller; improve edema.  # Jan 2019- Progression [CT chest; [facility]]; Brain MRI- new sub cm lesions- monitored  # feb 14th 2019- Tax-Cyram   --------------------------------------------------------------     MOLECULAR STUDIES:  KRAS MUTATED/Tissues not sufficient for OTHER the molecular marker; will need repeat Bx ; GUARDIANT TESTING [[facility]]- pending. [phone]]  [facility] No targettable mutations.      Primary cancer of right lower lobe of lung ([place]    INTERVAL HISTORY:  Deanna Nored89.[id number].  female pleasant patient above history of Recurrent Metastatic adenocarcinoma the lung; And brain metastases- Currently on Avastin maintenance is here for follow-up/proceed with Taxotere-Cyramza.  Patient denies any worsening shortness of breath or cough.  Denies any chest pain.  No back pain.  No headaches.  Not on steroids.  Denies any swelling in the legs.   REVIEW OF SYSTEMS:  A complete 10 point review of system is done which is negative except mentioned above/history of present illness.   PAST MEDICAL HISTORY :  Past Medical History:  Diagnosis Date  . Anxiety   . [place] right lung [place], 01/13/17  . Depression   . Depression with anxiety    Well controlled with Wellbutrin and Buspar  . Pneumonia     PAST SURGICAL HISTORY :   Past Surgical History:  Procedure Laterality Date  . ECTOPIC PREGNANCY SURGERY  1988  . ELECTROMAGNETIC NAVIGATION BROCHOSCOPY N/A 02/10/2015   Procedure: ELECTROMAGNETIC NAVIGATION BRONCHOSCOPY;  Surgeon: KuFlora LippsMD;  Location: [facility] ORS;  Service: Cardiopulmonary;  Laterality: N/A;  . ENDOBRONCHIAL ULTRASOUND N/A 02/10/2015   Procedure: ENDOBRONCHIAL ULTRASOUND;  Surgeon: KuFlora LippsMD;  Location: [facility] ORS;  Service: Cardiopulmonary;  Laterality: N/A;  . PERIPHERAL VASCULAR CATHETERIZATION N/A 03/05/2015   Procedure: PoGlori Luisath Insertion;  Surgeon: GrKatha CabalMD;  [facility];  Service: Cardiovascular;  Laterality: N/A;    FAMILY HISTORY :   Family  History  Problem Relation Age of Onset  . Cancer Father        Prostate Cancer  . Hypertension Father   . Stroke Father   . Hypertension Brother     SOCIAL HISTORY:   Social History   Tobacco Use  . Smoking status: Former Smoker    Packs/day: 0.25    Years: 30.00    Pack years:  7.50    Types: Cigarettes    Last attempt to quit: 02/06/2005    Years since quitting: 12.0  . Smokeless tobacco: Never Used  Substance Use Topics  . Alcohol use: Yes    Alcohol/week: 3.0 oz    Types: 5 Cans of beer per week    Comment: 5 beers weekly  . Drug use: No    ALLERGIES:  has No Known Allergies.  MEDICATIONS:  Current Outpatient Medications  Medication Sig Dispense Refill  . dexamethasone (DECADRON) 4 MG tablet Take 1 tablet (4 mg total) by mouth 2 (two) times daily with a meal. Start the day prior to chemo; for 3 days. 60 tablet 3  . docusate sodium (COLACE) 100 MG capsule Take 100 mg by mouth 2 (two) times daily.    . Fluticasone-Umeclidin-Vilant (TRELEGY ELLIPTA) 100-62.5-25 MCG/INH AEPB Inhale 1 puff into the lungs daily. 1 each 4  . gabapentin (NEURONTIN) 100 MG capsule Take 2 capsules (200 mg total) by mouth 3 (three) times daily. 180 capsule 3  . lidocaine-prilocaine (EMLA) cream Apply 1 application topically as needed. 30 g 6  . ondansetron (ZOFRAN) 8 MG tablet Take 1 tablet (8 mg total) by mouth every 8 (eight) hours as needed for nausea or vomiting (start 3 days; after chemo). 40 tablet 1  . Oxycodone HCl 10 MG TABS Take 1 tablet (10 mg total) by mouth every 6 (six) hours. 60 tablet 0  . prochlorperazine (COMPAZINE) 10 MG tablet Take 1 tablet (10 mg total) by mouth every 6 (six) hours as needed for nausea or vomiting. 25 tablet 1  . zolpidem (AMBIEN) 5 MG tablet TAKE 1 TABLET BY MOUTH AT BEDTIME AS NEEDED FOR SLEEP 30 tablet 2  . acetaminophen (TYLENOL) 500 MG tablet Take 1,000 mg by mouth every 4 (four) hours as needed.      No current facility-administered medications for this visit.     PHYSICAL EXAMINATION: ECOG PERFORMANCE STATUS: 0 - Asymptomatic  BP (!) 148/87   Pulse 63   Temp 97.9 F (36.6 C) (Tympanic)   Resp 20   Ht 5' 3" (1.6 m)   Wt 129 lb (58.5 kg)   LMP 06/16/2000 (Approximate) Comment: 15 yrs ago  BMI 22.85 kg/m   Filed Weights    02/17/17 0923  Weight: 129 lb (58.5 kg)    GENERAL: Well-nourished well-developed; Alert, no distress and comfortable. She is accompanied by her husband.  EYES: no pallor or icterus OROPHARYNX: no thrush or ulceration; good dentition  NECK: supple, no masses felt LYMPH:  no palpable lymphadenopathy in the cervical, axillary or inguinal regions LUNGS: clear to auscultation and  No wheeze or crackles HEART/CVS: regular rate & rhythm and no murmurs; No lower extremity edema;No erythema. ABDOMEN:abdomen soft, non-tender and normal bowel sounds Musculoskeletal:no cyanosis of digits and no clubbing  PSYCH: alert & oriented x 3 with fluent speech.  NEURO: no focal motor/sensory deficits SKIN:  no rashes or significant lesions  LABORATORY DATA:  I have reviewed the data as listed    Component Value Date/Time   NA 134 (L) 02/17/2017  0906   NA 139 01/03/2015 0910   K 3.9 02/17/2017 0906   CL 101 02/17/2017 0906   CO2 23 02/17/2017 0906   GLUCOSE 134 (H) 02/17/2017 0906   BUN 20 02/17/2017 0906   BUN 10 01/03/2015 0910   CREATININE 0.82 02/17/2017 0906   CALCIUM 9.4 02/17/2017 0906   PROT 7.5 02/17/2017 0906   PROT 6.5 01/03/2015 0910   ALBUMIN 4.1 02/17/2017 0906   ALBUMIN 3.8 01/03/2015 0910   AST 27 02/17/2017 0906   ALT 10 (L) 02/17/2017 0906   ALKPHOS 50 02/17/2017 0906   BILITOT 0.7 02/17/2017 0906   BILITOT <0.2 01/03/2015 0910   GFRNONAA >60 02/17/2017 0906   GFRAA >60 02/17/2017 0906    No results found for: SPEP, UPEP  Lab Results  Component Value Date   WBC 6.6 02/17/2017   NEUTROABS 5.2 02/17/2017   HGB 12.9 02/17/2017   HCT 37.2 02/17/2017   MCV 106.2 (H) 02/17/2017   PLT 248 02/17/2017      Chemistry      Component Value Date/Time   NA 134 (L) 02/17/2017 0906   NA 139 01/03/2015 0910   K 3.9 02/17/2017 0906   CL 101 02/17/2017 0906   CO2 23 02/17/2017 0906   BUN 20 02/17/2017 0906   BUN 10 01/03/2015 0910   CREATININE 0.82 02/17/2017 0906       Component Value Date/Time   CALCIUM 9.4 02/17/2017 0906   ALKPHOS 50 02/17/2017 0906   AST 27 02/17/2017 0906   ALT 10 (L) 02/17/2017 0906   BILITOT 0.7 02/17/2017 0906   BILITOT <0.2 01/03/2015 0910     09/14/2016 [compared to June 2018]  FINDINGS: There is a semisolid mass with underlying cystic changes and cavitation in the superior segment of the right lower lobe measuring slightly greater than 6 cm in size. This has not changed significantly since the prior outside examination. Peripheral groundglass and airspace infiltrates in the right lower lobe inferiorly are stable as are the groundglass infiltrates in the inferior right upper lobe, some of which contain cystic changes. Mild interlobular septal thickening in the right upper lobe persists.  2 mm lingular nodule (2/157) is unchanged. Right perihilar and lower lobe bronchial wall thickening is again noted. Right IJ Port-A-Cath tip in the upper RA. Old right rib fracture. Mild emphysematous changes in the upper lungs. No thoracic adenopathy by size criteria. ---------------------------------------------------------- 09/14/2016- IMPRESSION:  No CT evidence of metastatic disease in the abdomen and pelvis.   RADIOGRAPHIC STUDIES: I have personally reviewed the radiological images as listed and agreed with the findings in the report. No results found.   ASSESSMENT & PLAN:  Primary cancer of right lower lobe of lung ([place]) T4 N2 M1-adenocarcinoma of the lung right side-on cabo-Taxol- Avastin. JAN 9th CT Chest [facility] clinic] Progression  Of lung disease vs ? Infection  [less likley]; MRI Brain-improved right frontal lesion; new 2 mm lesion noted in right paietal lobe.   # start cycle #1 Ram-Tax today; as molecular testing at [facility] unrevealing for any targeted mutations. Spoke to [employer].  Patient visibly upset given the absence of any targetable mutation.  Also discussed regarding participation in clinical trials either at  [facility].  Patient will check with insurance.  #Discussed the potential side effects of chemotherapy-Taxotere-myalgias neuropathy; steroid premedications etc. Also discussed re: diarrhea.   # Symptomatic brain metastases- s/p GK in [facility]; but new 2 mm lesion noted; No GK; to be monitored.   #  PN-G-1.  Sec to taxol; neurotin TID-improving.  # Growth factor-Neulasta/On pro would be given as prophylaxis for chemotherapy-induced neutropenia to prevent febrile neutropenias. Discussed potential side effect- myalgias/arthralgias- recommend Claritin for 4 days.   # follow up in 3 weeks; Tax-Ram;  labs/MD; UA. 10 days- labs/NP; hold tube- symptom management.   Orders Placed This Encounter  Procedures  . CBC with Differential/Platelet    Standing Status:   Future    Standing Expiration Date:   02/17/2018  . Comprehensive metabolic panel    Standing Status:   Future    Standing Expiration Date:   02/17/2018  . CBC with Differential/Platelet    Standing Status:   Future    Standing Expiration Date:   02/17/2018  . Comprehensive metabolic panel    Standing Status:   Future    Standing Expiration Date:   02/17/2018  . Urinalysis, Complete w Microscopic    Standing Status:   Future    Standing Expiration Date:   06/17/2017  . Hold Tube- Blood Bank    Standing Status:   Future    Standing Expiration Date:   02/17/2018     Cammie Sickle, MD 02/17/2017 7:45 PM

## 2017-02-18 ENCOUNTER — Inpatient Hospital Stay: Payer: BLUE CROSS/BLUE SHIELD

## 2017-02-18 DIAGNOSIS — C3431 Malignant neoplasm of lower lobe, right bronchus or lung: Secondary | ICD-10-CM

## 2017-02-18 MED ORDER — PEGFILGRASTIM-CBQV 6 MG/0.6ML ~~LOC~~ SOSY
6.0000 mg | PREFILLED_SYRINGE | Freq: Once | SUBCUTANEOUS | Status: AC
  Administered 2017-02-18: 6 mg via SUBCUTANEOUS
  Filled 2017-02-18: qty 0.6

## 2017-02-21 ENCOUNTER — Encounter: Payer: Self-pay | Admitting: Pharmacy Technician

## 2017-02-21 NOTE — Progress Notes (Unsigned)
Patient has been approved for co-pay assistance by [employer] for Udenyca. The enrollment period is from 02/18/17-02/18/18 based on [employer]. First DOS covered is 02/18/17.

## 2017-02-23 ENCOUNTER — Other Ambulatory Visit: Payer: Self-pay | Admitting: *Deleted

## 2017-02-23 ENCOUNTER — Ambulatory Visit: Payer: Self-pay | Admitting: Internal Medicine

## 2017-02-23 ENCOUNTER — Telehealth: Payer: Self-pay | Admitting: *Deleted

## 2017-02-23 DIAGNOSIS — C3431 Malignant neoplasm of lower lobe, right bronchus or lung: Secondary | ICD-10-CM

## 2017-02-23 MED ORDER — OXYCODONE-ACETAMINOPHEN 10-325 MG PO TABS: 1.0000 | ORAL_TABLET | ORAL | 0 refills | Status: DC | PRN

## 2017-02-23 MED ORDER — IBUPROFEN 400 MG PO TABS: 400.0000 mg | ORAL_TABLET | Freq: Three times a day (TID) | ORAL | 0 refills | Status: DC | PRN

## 2017-02-23 NOTE — Progress Notes (Signed)
Patient called [facility] requesting refill of pain medication. Per Dr. Rogue Bussing, I will place order for Percocet 10-325 and Ibuprofen 400mg  tablet.   [place] Controlled Substance Reporting System reviewed and given that this is a new prescription, is appropriate. Prescription sent electronically to pharmacy using [facility]. Patient notified by Triage RN Hassan Rowan.    NCCSRS reviewed:     Beckey Rutter, DNP, AGNP-C [place] at [facility] [phone] (office) 02/23/17 11:18 AM

## 2017-02-23 NOTE — Telephone Encounter (Signed)
Per VO Dr Rogue Bussing, patient can take motrin 400 mg three times a day and change Oxycodone to Percocet 10/325 every 4 hours prn

## 2017-02-23 NOTE — Telephone Encounter (Signed)
Patient called to report that she is in severe pain times 2 days. Pain is all over from top of chest to her knees, like spasms. She has increased her Oxycodone 10 mg to every 4 hours from every 6 and still not getting relief; so she took 15 mg this morning. She is asking for something stronger or order that she can increase her Oxycodone to 1.5 tabs every 4 hours as needed. She states she started her new infusion last week. Please advise.

## 2017-02-28 ENCOUNTER — Inpatient Hospital Stay: Payer: BLUE CROSS/BLUE SHIELD

## 2017-02-28 ENCOUNTER — Inpatient Hospital Stay (HOSPITAL_BASED_OUTPATIENT_CLINIC_OR_DEPARTMENT_OTHER): Payer: BLUE CROSS/BLUE SHIELD | Admitting: Nurse Practitioner

## 2017-02-28 ENCOUNTER — Other Ambulatory Visit: Payer: Self-pay

## 2017-02-28 ENCOUNTER — Encounter: Payer: Self-pay | Admitting: Nurse Practitioner

## 2017-02-28 VITALS — BP 168/85 | HR 65 | Temp 97.9°F | Resp 20

## 2017-02-28 DIAGNOSIS — G620 Drug-induced polyneuropathy: Secondary | ICD-10-CM | POA: Diagnosis not present

## 2017-02-28 DIAGNOSIS — C3431 Malignant neoplasm of lower lobe, right bronchus or lung: Secondary | ICD-10-CM

## 2017-02-28 DIAGNOSIS — J069 Acute upper respiratory infection, unspecified: Secondary | ICD-10-CM

## 2017-02-28 DIAGNOSIS — M2550 Pain in unspecified joint: Secondary | ICD-10-CM

## 2017-02-28 DIAGNOSIS — T451X5A Adverse effect of antineoplastic and immunosuppressive drugs, initial encounter: Secondary | ICD-10-CM

## 2017-02-28 DIAGNOSIS — J029 Acute pharyngitis, unspecified: Secondary | ICD-10-CM | POA: Diagnosis not present

## 2017-02-28 DIAGNOSIS — K1231 Oral mucositis (ulcerative) due to antineoplastic therapy: Secondary | ICD-10-CM

## 2017-02-28 DIAGNOSIS — G893 Neoplasm related pain (acute) (chronic): Secondary | ICD-10-CM

## 2017-02-28 DIAGNOSIS — G8918 Other acute postprocedural pain: Secondary | ICD-10-CM | POA: Insufficient documentation

## 2017-02-28 LAB — CBC WITH DIFFERENTIAL/PLATELET
BASOS ABS: 0.1 10*3/uL (ref 0–0.1)
Basophils Relative: 1 %
EOS PCT: 0 %
Eosinophils Absolute: 0 10*3/uL (ref 0–0.7)
HEMATOCRIT: 35 % (ref 35.0–47.0)
Hemoglobin: 12.3 g/dL (ref 12.0–16.0)
LYMPHS ABS: 3 10*3/uL (ref 1.0–3.6)
LYMPHS PCT: 13 %
MCH: 37.2 pg — AB (ref 26.0–34.0)
MCHC: 35.3 g/dL (ref 32.0–36.0)
MCV: 105.2 fL — AB (ref 80.0–100.0)
MONO ABS: 1.1 10*3/uL — AB (ref 0.2–0.9)
MONOS PCT: 5 %
NEUTROS ABS: 19.4 10*3/uL — AB (ref 1.4–6.5)
Neutrophils Relative %: 81 %
PLATELETS: 129 10*3/uL — AB (ref 150–440)
RBC: 3.32 MIL/uL — ABNORMAL LOW (ref 3.80–5.20)
RDW: 13.3 % (ref 11.5–14.5)
WBC: 23.6 10*3/uL — ABNORMAL HIGH (ref 3.6–11.0)

## 2017-02-28 LAB — COMPREHENSIVE METABOLIC PANEL
ALBUMIN: 3.5 g/dL (ref 3.5–5.0)
ALT: 18 U/L (ref 14–54)
ANION GAP: 7 (ref 5–15)
AST: 27 U/L (ref 15–41)
Alkaline Phosphatase: 145 U/L — ABNORMAL HIGH (ref 38–126)
BILIRUBIN TOTAL: 0.4 mg/dL (ref 0.3–1.2)
BUN: 24 mg/dL — AB (ref 6–20)
CO2: 24 mmol/L (ref 22–32)
Calcium: 8.7 mg/dL — ABNORMAL LOW (ref 8.9–10.3)
Chloride: 107 mmol/L (ref 101–111)
Creatinine, Ser: 0.64 mg/dL (ref 0.44–1.00)
GFR calc Af Amer: >60 mL/min (ref >60)
GFR calc non Af Amer: >60 mL/min (ref >60)
GLUCOSE: 100 mg/dL — AB (ref 65–99)
POTASSIUM: 3.3 mmol/L — AB (ref 3.5–5.1)
Sodium: 138 mmol/L (ref 135–145)
Total Protein: 6.3 g/dL — ABNORMAL LOW (ref 6.5–8.1)

## 2017-02-28 LAB — SAMPLE TO BLOOD BANK

## 2017-02-28 MED ORDER — SODIUM CHLORIDE 0.9% FLUSH
10.0000 mL | Freq: Once | INTRAVENOUS | Status: AC
  Administered 2017-02-28: 10 mL via INTRAVENOUS
  Filled 2017-02-28: qty 10

## 2017-02-28 MED ORDER — OXYCODONE HCL 10 MG PO TABS
10.0000 mg | ORAL_TABLET | Freq: Four times a day (QID) | ORAL | 0 refills | Status: DC | PRN
Start: 2017-03-02 — End: 2017-04-06

## 2017-02-28 MED ORDER — GABAPENTIN 100 MG PO CAPS: 200.0000 mg | ORAL_CAPSULE | Freq: Three times a day (TID) | ORAL | 6 refills | Status: DC

## 2017-02-28 MED ORDER — HEPARIN SOD (PORK) LOCK FLUSH 100 UNIT/ML IV SOLN
500.0000 [IU] | Freq: Once | INTRAVENOUS | Status: AC
  Administered 2017-02-28: 500 [IU] via INTRAVENOUS
  Filled 2017-02-28: qty 5

## 2017-02-28 MED ORDER — MAGIC MOUTHWASH W/LIDOCAINE: 5.0000 mL | Freq: Four times a day (QID) | ORAL | 3 refills | Status: DC

## 2017-02-28 NOTE — Patient Instructions (Signed)
Thank you for allowing me to participate in your care. I will send the prescription for Magic Mouthwash to your pharmacy. Please start Flonase for runny nose and congestion. If symptoms worsen during the week, please call clinic for re-evaluation.  -Beckey Rutter, NP  Oral Mucositis Oral mucositis is a mouth condition that may develop from treatments for cancer. With this condition, sores may appear on your lips, gums, tongue, throat, and the top (roof) or bottom (floor) of your mouth. What are the causes? Oral mucositis can happen to anyone who is being treated with cancer therapies, including:  Cancer medicines (chemotherapy).  Radiation therapy.  Bone marrow transplants and stem cell transplants.  Cancer treatments can damage the lining of the mouth, and that causes this condition. Oral mucositis is not caused by infection. However, the sores can become infected after they form. Infection can make oral mucositis worse. What increases the risk? This condition is more likely to develop in people:  With poor oral hygiene.  With dental problems or oral diseases.  Who use any tobacco product, including cigarettes, chewing tobacco, and electronic cigarettes.  Who drink alcohol.  Who have other medical conditions, such as diabetes, HIV, AIDS, or kidney disease.  Who do not drink enough clear fluids.  Who wear dentures that do not fit correctly.  Who have cancers that primarily affect the blood.  Who are female.  What are the signs or symptoms? Symptoms can vary from mild to severe. Symptoms are usually seen 7-10 days after cancer treatment has started. Symptoms include:  Mouth sores. These sores may bleed.  Color changes inside the mouth. Red, shiny areas may appear.  White patches or pus in the mouth.  Pain in the mouth and throat. This can make it painful to speak.  Dryness and a burning feeling in the mouth.  Saliva that is dry and thick.  Trouble eating, drinking, and  swallowing. This can lead to weight loss.  How is this diagnosed? This condition can be diagnosed with a physical exam. How is this treated? Treatment depends on the severity of the condition. Oral mucositis often heals on its own. Sometimes, changes in the cancer treatment can help. Treatment may include medicines, such as:  An antibiotic medicine to fight infection, if present.  Medicine to help the cells in your mouth heal more quickly.  Medicine may also be given to help control pain. This may include:  Pain relievers that are swished around in the mouth. These make the mouth numb to ease the pain (topical anesthetics).  Mouth rinses.  Prescribed, medicated gels. The gel coats the mouth. This protects nerve endings and lessens pain.  Narcotic pain medicines.  Follow these instructions at home: Medicines  Do not use products that contain benzocaine (including numbing gels) to treat teething or mouth pain in children who are younger than 2 years. These products may cause a rare but serious blood condition.  If you were prescribed an antibiotic medicine, finish all of it even if you start to feel better.  Take or apply medicines only as directed by your health care provider. Lifestyle  Keeping your mouth clean and germ-free is important. To maintain good oral hygiene: ? Brush your teeth carefully with a soft, nylon-bristled toothbrush at least two times each day. Use a gentle toothpaste. Ask your health care provider for a toothpaste recommendation. ? Floss your teeth every day. ? Have your teeth cleaned regularly as recommended by your dentist. ? Rinse your mouth after every  meal or as directed by your health care provider. Do not use mouthwash that contains alcohol. Ask your health care provider for a mouthwash recommendation.  Do not use any tobacco products, including cigarettes, chewing tobacco, and electronic cigarettes. If you need help quitting, ask your health care  provider.  Avoid eating: ? Hot and spicy foods. ? Citrus. ? Foods that have sharp edges, such as chips. ? Sugary foods, such as candy.  Do not drink alcohol. General instructions  Clean the sores as directed by your health care provider.  If your lips are dry or cracked, apply a water-based moisturizer to your lips as needed.  Try sucking on ice chips or sugar-free frozen pops. This may help with pain. This also keeps your mouth moist.  Drink enough fluid to keep your urine clear or pale yellow.  If you are losing weight, talk with your health care provider.  If you have dentures, take them out often as directed by your health care provider.  Keep all follow-up visits as directed by your health care provider. This is important. Contact a health care provider if:  You have mouth pain or throat pain.  You are having more trouble swallowing.  Your symptoms get worse.  You have new symptoms.  Your pain is not controlled with medicine. Get help right away if:  You have a lot of bleeding in your mouth.  You have trouble speaking.  You develop new, open, or draining sores in your mouth.  You cannot swallow solid food or liquids.  You have a fever. This information is not intended to replace advice given to you by your health care provider. Make sure you discuss any questions you have with your health care provider. Document Released: 08/07/2010 Document Revised: 05/28/2016 Document Reviewed: 12/17/2013 Elsevier Interactive Patient Education  2018 [employer]. Fluticasone nasal spray What is this medicine? FLUTICASONE (floo TIK a sone) is a corticosteroid. This medicine is used to treat the symptoms of allergies like sneezing, itchy red eyes, and itchy, runny, or stuffy nose. This medicine is also used to treat nasal polyps. This medicine may be used for other purposes; ask your health care provider or pharmacist if you have questions. COMMON BRAND NAME(S): Flonase,  Flonase Allergy Relief, Flonase Sensimist, Veramyst, XHANCE What should I tell my health care provider before I take this medicine? They need to know if you have any of these conditions: -cataracts -glaucoma -infection, like tuberculosis, herpes, or fungal infection -recent surgery on nose or sinuses -taking a corticosteroid by mouth -an unusual or allergic reaction to fluticasone, steroids, other medicines, foods, dyes, or preservatives -pregnant or trying to get pregnant -breast-feeding How should I use this medicine? This medicine is for use in the nose. Follow the directions on your product or prescription label. This medicine works best if used at regular intervals. Do not use more often than directed. Make sure that you are using your nasal spray correctly. After 6 months of daily use for allergies, talk to your doctor or health care professional before using it for a longer time. Ask your doctor or health care professional if you have any questions. Talk to your pediatrician regarding the use of this medicine in children. Special care may be needed. Some products have been used for allergies in children as young as 2 years. After 2 months of daily use without a prescription in a child, talk to your pediatrician before using it for a longer time. Use of this medicine for nasal  polyps is not approved in children. Overdosage: If you think you have taken too much of this medicine contact a poison control center or emergency room at once. NOTE: This medicine is only for you. Do not share this medicine with others. What if I miss a dose? If you miss a dose, use it as soon as you remember. If it is almost time for your next dose, use only that dose and continue with your regular schedule. Do not use double or extra doses. What may interact with this medicine? -certain antibiotics like clarithromycin and telithromycin -certain medicines for fungal infections like ketoconazole, itraconazole, and  voriconazole -conivaptan -nefazodone -some medicines for HIV -vaccines This list may not describe all possible interactions. Give your health care provider a list of all the medicines, herbs, non-prescription drugs, or dietary supplements you use. Also tell them if you smoke, drink alcohol, or use illegal drugs. Some items may interact with your medicine. What should I watch for while using this medicine? Visit your doctor or health care professional for regular checks on your progress. Some symptoms may improve within 12 hours after starting use. Check with your doctor or health care professional if there is no improvement in your symptoms after 3 weeks of use. This medicine may increase your risk of getting an infection. Tell your doctor or health care professional if you are around anyone with measles or chickenpox, or if you develop sores or blisters that do not heal properly. What side effects may I notice from receiving this medicine? Side effects that you should report to your doctor or health care professional as soon as possible: -allergic reactions like skin rash, itching or hives, swelling of the face, lips, or tongue -changes in vision -crusting or sores in the nose -nosebleed -signs and symptoms of infection like fever or chills; cough; sore throat -white patches or sores in the mouth or nose Side effects that usually do not require medical attention (report to your doctor or health care professional if they continue or are bothersome): -burning or irritation inside the nose or throat -cough -headache -unusual taste or smell This list may not describe all possible side effects. Call your doctor for medical advice about side effects. You may report side effects to FDA at 1-800-FDA-1088. Where should I keep my medicine? Keep out of the reach of children. Store at room temperature between 15 and 30 degrees C (59 and 86 degrees F). Avoid exposure to extreme heat, cold, or light.  Throw away any unused medicine after the expiration date. NOTE: This sheet is a summary. It may not cover all possible information. If you have questions about this medicine, talk to your doctor, pharmacist, or health care provider.  2018 Elsevier/Gold Standard (2015-10-03 14:23:12)

## 2017-02-28 NOTE — Progress Notes (Signed)
Symptom Management Consult note [facility]  [phone] Fax:([phone]) [phone]  Patient Care Team: Rubye Beach as PCP - General (Family Medicine) Allyne Gee, MD as Referring Physician (Internal Medicine)   Name of the patient: Deanna Blair  [id number]  1955/06/12   Date of visit: 02/28/17  Diagnosis- Malignancy neoplasm of trachea, bronchus, and lung- Stage IIIB  Chief complaint/ Reason for visit- Mouth Sores & URI symptoms  Heme/Onc history: Patient last evaluated by primary oncologist, Dr. Rogue Bussing, on 02/17/17. She has below oncology history:  # FEB 2017-[place] CA Right Lower Lung T4N2M0- STAGE IIIB [Bronch & Subcarinal LNBx]- March 8th START CARBO-ALIMTA x4 cycles- June 8th 2017 - IMPROVED FDG MULTI-FOCAL lesions/N2-LN activity. S/p [place]-- Alimta x6  # AUG 18th- Alimta- Avastin maintenance; SEP 13th DISCONTINUE AVASTIN [sec to cavitary lesion]; OCT 20th 2017- CT-STABLE Disease; NOV 8th CT/PET [facility] clinic]- "overall slight progression-? Lymphangiectatic spread"  # Jan - April 2018[facility] Husbands; progression  # April 25th 2018- Carbo- Taxol x3 ccyles; June 2018- CT chest STABLE Disease;July 25th- Add Avastin to [place] taxol x6 cycles- Sep 2018-CT scan- [facility] STABLE   # Sep 2018- Avastin Maintence;   JAN 9th CT- right lung progression [s/p Bx- [place]; [facility]; F-one- 1/15  # NOV 2nd 2017-MRI, [facility]- Brain mets [asymptomatic;Right frontal ~25mm; ~27mm lesions -appx 3-4 lesions; (right Frontal & Left parietal s/p GK x 2 lesions] ; June 2018- Progession vs radiation necrosis [July 2th-Add Avastin]  # Oct 2018[facility] clinic] 4 small subcentimeter brain lesions for which she had the Gamma knife. The main right frontal lesion gotten smaller; improve edema.  # Jan 2019- Progression [CT chest; [facility]]; Brain MRI- new sub cm lesions- monitored  # feb 14th 2019- Tax-Cyram   Interval history-patient presents to symptom management  clinic today for concerns of mouth sores, URI symptoms, and pain. Mouth sores presented on 02/17/17 (approximately).  She notes sores on the roof of her mouth and one on her tongue in the past day.  She feels that they are relatively unchanged since presenting.  She describes her mouth is "sore" and having sore throat with swallowing.  She has been rinsing her mouth with baking soda and gargling which is not improved her symptoms.  Solid foods are irritating and subsequently she is eating soft foods and liquids. No sores on lips. No history of fever blisters or cold sores.  Patient also complaining of rhinorrhea which began approximately 3 days ago.  Symptoms are unchanged since that time.  She describes drainage as thin and watery.  Mild sinus congestion.  No fever or chills, no cough, no shortness of breath or wheezing.  Has not taken anything for symptoms.  States she feels better today believes symptoms may be improving. Patient also requesting refill of pain medication.  Has noticed all over "bone pain" since approximately 3-4 days after chemotherapy. She has been taking Claritin but not Tylenol.  Received Udenyca with chemo. Rates her pain 4/10. Describes as 'aching'. On Oxycodone 10mg  for acute pain which has not helped. Unaware of anything that makes pain better or worse. Describes pain as 'bothersome' but does not impact her ADLs.    ECOG FS:1 - Symptomatic but completely ambulatory  Review of systems- Review of Systems  Constitutional: Negative for chills, diaphoresis, fever, malaise/fatigue and weight loss.  HENT: Positive for congestion and sore throat. Negative for ear pain, hearing loss, nosebleeds, sinus pain and tinnitus.   Eyes: Negative for pain, discharge  and redness.  Respiratory: Negative.  Negative for stridor.   Cardiovascular: Negative.   Gastrointestinal: Negative.   Genitourinary: Negative.   Musculoskeletal: Positive for myalgias.  Skin: Negative.   Neurological: Positive  for tingling and sensory change (finger numbness). Negative for tremors and weakness.  Endo/Heme/Allergies: Negative.   Psychiatric/Behavioral:       Irritated     Current treatment- s/p cycle 1 Docetaxel + Ramucirumab (02/17/17) with Udenyca support (02/18/17)  No Known Allergies   Past Medical History:  Diagnosis Date  . Anxiety   . Cancer of right lung ([place]) 02/13/2015, 01/13/17  . Depression   . Depression with anxiety    Well controlled with Wellbutrin and Buspar  . Pneumonia      Past Surgical History:  Procedure Laterality Date  . ECTOPIC PREGNANCY SURGERY  1988  . ELECTROMAGNETIC NAVIGATION BROCHOSCOPY N/A 02/10/2015   Procedure: ELECTROMAGNETIC NAVIGATION BRONCHOSCOPY;  Surgeon: Flora Lipps, MD;  Location: [facility] ORS;  Service: Cardiopulmonary;  Laterality: N/A;  . ENDOBRONCHIAL ULTRASOUND N/A 02/10/2015   Procedure: ENDOBRONCHIAL ULTRASOUND;  Surgeon: Flora Lipps, MD;  Location: [facility] ORS;  Service: Cardiopulmonary;  Laterality: N/A;  . PERIPHERAL VASCULAR CATHETERIZATION N/A 03/05/2015   Procedure: Glori Luis Cath Insertion;  Surgeon: Katha Cabal, MD;  Location: [facility];  Service: Cardiovascular;  Laterality: N/A;    Social History   Socioeconomic History  . Marital status: Married    Spouse name: Herbie Baltimore  . Number of children: 1  . Years of education: 68  . Highest education level: Not on file  Social Needs  . Financial resource strain: Not on file  . Food insecurity - worry: Not on file  . Food insecurity - inability: Not on file  . Transportation needs - medical: Not on file  . Transportation needs - non-medical: Not on file  Occupational History  . 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 Smoking status: Former Smoker    Packs/day: 0.25    Years: 30.00    Pack years: 7.50    Types: Cigarettes    Last attempt to quit: 02/06/2005    Years since quitting: 12.0  . Smokeless tobacco: Never Used  Substance  and Sexual Activity  . Alcohol use: Yes    Alcohol/week: 3.0 oz    Types: 5 Cans of beer per week    Comment: 5 beers weekly  . Drug use: No  . Sexual activity: Yes    Partners: Male  Other Topics Concern  . Not on file  Social History Narrative   [name] was born in [place], [place]. She moved with her family with [facility] and lived there for 13 years. She recently moved to [place] approximately 1 year ago. She lives with her husband of 28 years. They have an adult daughter who lives in [place]. 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 She is enjoying driving around and getting to know [place]. She and her husband enjoy cooking and baking. They also enjoy hiking when the weather permits.    Family History  Problem Relation Age of Onset  . Cancer Father        Prostate Cancer  . Hypertension Father   . Stroke Father   . Hypertension Brother      Current Outpatient Medications:  .  acetaminophen (TYLENOL) 500 MG tablet, Take 1,000 mg by mouth every 4 (four) hours as needed. , Disp: , Rfl:  .  docusate sodium (COLACE) 100 MG capsule, Take 100 mg by mouth 2 (two) times daily., Disp: , Rfl:  .  Fluticasone-Umeclidin-Vilant (TRELEGY ELLIPTA) 100-62.5-25 MCG/INH AEPB, Inhale 1 puff into the lungs daily., Disp: 1 each, Rfl: 4 .  gabapentin (NEURONTIN) 100 MG capsule, Take 2 capsules (200 mg total) by mouth 3 (three) times daily., Disp: 180 capsule, Rfl: 3 .  lidocaine-prilocaine (EMLA) cream, Apply 1 application topically as needed., Disp: 30 g, Rfl: 6 .  ondansetron (ZOFRAN) 8 MG tablet, Take 1 tablet (8 mg total) by mouth every 8 (eight) hours as needed for nausea or vomiting (start 3 days; after chemo)., Disp: 40 tablet, Rfl: 1 .  Oxycodone HCl 10 MG TABS, Take 1 tablet (10 mg total) by mouth every 6 (six) hours., Disp: 60 tablet, Rfl: 0 .  prochlorperazine (COMPAZINE) 10 MG tablet, Take 1 tablet (10 mg total) by mouth every 6 (six) hours as needed for nausea  or vomiting., Disp: 25 tablet, Rfl: 1 .  zolpidem (AMBIEN) 5 MG tablet, TAKE 1 TABLET BY MOUTH AT BEDTIME AS NEEDED FOR SLEEP, Disp: 30 tablet, Rfl: 2 .  dexamethasone (DECADRON) 4 MG tablet, Take 1 tablet (4 mg total) by mouth 2 (two) times daily with a meal. Start the day prior to chemo; for 3 days. (Patient not taking: Reported on 02/28/2017), Disp: 60 tablet, Rfl: 3 .  ibuprofen (IBU) 400 MG tablet, Take 1 tablet (400 mg total) by mouth 3 (three) times daily as needed. (Patient not taking: Reported on 02/28/2017), Disp: 30 tablet, Rfl: 0 No current facility-administered medications for this visit.   Facility-Administered Medications Ordered in Other Visits:  .  heparin lock flush 100 unit/mL, 500 Units, Intravenous, Once, Cammie Sickle, MD  Physical exam:  Vitals:   02/28/17 0945  BP: (!) 168/85  Pulse: 65  Resp: 20  Temp: 97.9 F (36.6 C)  TempSrc: Tympanic   Physical Exam  Constitutional: She is oriented to person, place, and time and well-developed, well-nourished, and in no distress. No distress.  HENT:  Head: Normocephalic and atraumatic.  Right Ear: Tympanic membrane and external ear normal. Tympanic membrane is not injected and not erythematous.  Left Ear: Tympanic membrane and external ear normal. Tympanic membrane is not injected, not erythematous and not bulging.  Nose: Rhinorrhea present. Right sinus exhibits no maxillary sinus tenderness and no frontal sinus tenderness. Left sinus exhibits no maxillary sinus tenderness and no frontal sinus tenderness.  Mouth/Throat: Oropharyngeal exudate (Clear drainage in mouth and nose) present.  Turbinates inflamed bilaterally Mouth sores on roof of mouth  Eyes: Conjunctivae are normal.  Neck: Normal range of motion. Neck supple.  Cardiovascular: Normal rate, regular rhythm and normal heart sounds.  Pulmonary/Chest: Effort normal and breath sounds normal. No respiratory distress. She has no wheezes.  Abdominal: Soft. Bowel  sounds are normal. There is no tenderness.  Musculoskeletal: She exhibits no edema or deformity.  Lymphadenopathy:    She has no cervical adenopathy.  Neurological: She is alert and oriented to person, place, and time.  Skin: Skin is warm and dry. No rash noted.  Psychiatric: Memory and judgment normal.     CMP Latest Ref Rng & Units 02/28/2017  Glucose 65 - 99 mg/dL 100(H)  BUN 6 - 20 mg/dL 24(H)  Creatinine 0.44 - 1.00 mg/dL 0.64  Sodium 135 - 145 mmol/L 138  Potassium 3.5 - 5.1 mmol/L 3.3(L)  Chloride 101 - 111 mmol/L 107  CO2 22 - 32 mmol/L 24  Calcium 8.9 -  10.3 mg/dL 8.7(L)  Total Protein 6.5 - 8.1 g/dL 6.3(L)  Total Bilirubin 0.3 - 1.2 mg/dL 0.4  Alkaline Phos 38 - 126 U/L 145(H)  AST 15 - 41 U/L 27  ALT 14 - 54 U/L 18   CBC Latest Ref Rng & Units 02/28/2017  WBC 3.6 - 11.0 K/uL 23.6(H)  Hemoglobin 12.0 - 16.0 g/dL 12.3  Hematocrit 35.0 - 47.0 % 35.0  Platelets 150 - 440 K/uL 129(L)    No images are attached to the encounter.  No results found.   Assessment and plan- Patient is a 61 y.o. female who presents to Symptom Management Clinic for URI like symptoms, pain, and Mouth Sores.   1. Lung Cancer- adenocarcinoma of the right lung (T4, N2, M1). Previously on carbo-taxol-avastin. 01/12/17 ct chest [facility] clinic) showed progress of lung disease vs infection; MRI brain- improved right frontal lesions with new 42mm lesion noted in right parietal lobe- monitor. Initiated cycle 1 Ram-Tax on 02/17/17 as molecular testing at [facility] was unrevealing for targetable mutations.   2. Chemotherapy induced neuropathy- Grade 1; secondary to Taxol. On Neurontin; stable and unchanged. Continue Neurontin. Will refill today.   3. Chemotherapy induced myalgias and joint pain- grade 1-2. Potentially side effect of Taxotere or Udenyca. Discussed role of Tylenol (not to exceed 3g in 24 hours) and Claritin (daily) in bone pain. [place] reviewed. Refill oxycodone appropriate on 03/02/17. Will submit  refill to be filled on or after that date.   4. Sinus Congestion- suspect viral etiology. Afebrile. WBC elevated in setting of Udenyca. WBC 23.6, ANC 19.4. Flonase for sinus congestion. Declines IV fluids today.   5. Chemotherapy induced Mucositis- Mild. Tolerating POs. Will send prescription for Magic Mouthwash. Continue to monitor.     Visit Diagnosis 1. Primary cancer of right lower lobe of lung ([place])   2. Neuropathy due to chemotherapeutic drug ([place])   3. Joint pain following chemotherapy   4. Mucositis due to chemotherapy   5. Viral upper respiratory tract infection   6. Cancer associated pain     Patient expressed understanding and was in agreement with this plan. She also understands that She can call clinic at any time with any questions, concerns, or complaints. Patient advised to notify the clinic if there is no improvement in symptoms or if symptoms worsen in next 3-4 days.    Beckey Rutter, DNP, AGNP-C [place] at [facility] [phone] (office) 02/28/17 12:06 PM

## 2017-02-28 NOTE — Progress Notes (Signed)
Patient c/o of post nasal drip/sore throat and mouth sores on tongue and lower lip. White patches on back of throat. Using baking soda mouth rinses 3 times a day.

## 2017-03-09 ENCOUNTER — Inpatient Hospital Stay: Payer: BLUE CROSS/BLUE SHIELD

## 2017-03-09 ENCOUNTER — Inpatient Hospital Stay (HOSPITAL_BASED_OUTPATIENT_CLINIC_OR_DEPARTMENT_OTHER): Payer: BLUE CROSS/BLUE SHIELD | Admitting: Internal Medicine

## 2017-03-09 ENCOUNTER — Inpatient Hospital Stay: Payer: BLUE CROSS/BLUE SHIELD | Attending: Internal Medicine

## 2017-03-09 ENCOUNTER — Encounter: Payer: Self-pay | Admitting: Internal Medicine

## 2017-03-09 ENCOUNTER — Other Ambulatory Visit: Payer: Self-pay

## 2017-03-09 DIAGNOSIS — M791 Myalgia, unspecified site: Secondary | ICD-10-CM

## 2017-03-09 DIAGNOSIS — Z8042 Family history of malignant neoplasm of prostate: Secondary | ICD-10-CM | POA: Insufficient documentation

## 2017-03-09 DIAGNOSIS — R21 Rash and other nonspecific skin eruption: Secondary | ICD-10-CM | POA: Insufficient documentation

## 2017-03-09 DIAGNOSIS — E43 Unspecified severe protein-calorie malnutrition: Secondary | ICD-10-CM | POA: Insufficient documentation

## 2017-03-09 DIAGNOSIS — C3431 Malignant neoplasm of lower lobe, right bronchus or lung: Secondary | ICD-10-CM

## 2017-03-09 DIAGNOSIS — G893 Neoplasm related pain (acute) (chronic): Secondary | ICD-10-CM

## 2017-03-09 DIAGNOSIS — Z79899 Other long term (current) drug therapy: Secondary | ICD-10-CM | POA: Diagnosis not present

## 2017-03-09 DIAGNOSIS — Z5111 Encounter for antineoplastic chemotherapy: Secondary | ICD-10-CM | POA: Diagnosis present

## 2017-03-09 DIAGNOSIS — L739 Follicular disorder, unspecified: Secondary | ICD-10-CM | POA: Insufficient documentation

## 2017-03-09 DIAGNOSIS — C7931 Secondary malignant neoplasm of brain: Secondary | ICD-10-CM | POA: Insufficient documentation

## 2017-03-09 DIAGNOSIS — G620 Drug-induced polyneuropathy: Secondary | ICD-10-CM | POA: Diagnosis not present

## 2017-03-09 DIAGNOSIS — Z5112 Encounter for antineoplastic immunotherapy: Secondary | ICD-10-CM | POA: Diagnosis present

## 2017-03-09 DIAGNOSIS — C7800 Secondary malignant neoplasm of unspecified lung: Secondary | ICD-10-CM | POA: Diagnosis not present

## 2017-03-09 DIAGNOSIS — I10 Essential (primary) hypertension: Secondary | ICD-10-CM | POA: Insufficient documentation

## 2017-03-09 DIAGNOSIS — Z87891 Personal history of nicotine dependence: Secondary | ICD-10-CM

## 2017-03-09 DIAGNOSIS — K1231 Oral mucositis (ulcerative) due to antineoplastic therapy: Secondary | ICD-10-CM | POA: Insufficient documentation

## 2017-03-09 DIAGNOSIS — Z5189 Encounter for other specified aftercare: Secondary | ICD-10-CM | POA: Diagnosis present

## 2017-03-09 DIAGNOSIS — M2550 Pain in unspecified joint: Secondary | ICD-10-CM | POA: Insufficient documentation

## 2017-03-09 DIAGNOSIS — E860 Dehydration: Secondary | ICD-10-CM | POA: Insufficient documentation

## 2017-03-09 LAB — CBC WITH DIFFERENTIAL/PLATELET
BASOS PCT: 0 %
Basophils Absolute: 0.1 10*3/uL (ref 0–0.1)
Eosinophils Absolute: 0 10*3/uL (ref 0–0.7)
Eosinophils Relative: 0 %
HEMATOCRIT: 37.7 % (ref 35.0–47.0)
Hemoglobin: 13.1 g/dL (ref 12.0–16.0)
LYMPHS PCT: 8 %
Lymphs Abs: 1.3 10*3/uL (ref 1.0–3.6)
MCH: 36.9 pg — ABNORMAL HIGH (ref 26.0–34.0)
MCHC: 34.8 g/dL (ref 32.0–36.0)
MCV: 106.1 fL — AB (ref 80.0–100.0)
MONO ABS: 1.1 10*3/uL — AB (ref 0.2–0.9)
Monocytes Relative: 7 %
NEUTROS ABS: 14.5 10*3/uL — AB (ref 1.4–6.5)
NEUTROS PCT: 85 %
Platelets: 268 10*3/uL (ref 150–440)
RBC: 3.56 MIL/uL — AB (ref 3.80–5.20)
RDW: 13.6 % (ref 11.5–14.5)
WBC: 16.9 10*3/uL — AB (ref 3.6–11.0)

## 2017-03-09 LAB — COMPREHENSIVE METABOLIC PANEL
ALK PHOS: 67 U/L (ref 38–126)
ALT: 22 U/L (ref 14–54)
ANION GAP: 9 (ref 5–15)
AST: 28 U/L (ref 15–41)
Albumin: 3.8 g/dL (ref 3.5–5.0)
BILIRUBIN TOTAL: 0.6 mg/dL (ref 0.3–1.2)
BUN: 33 mg/dL — ABNORMAL HIGH (ref 6–20)
CALCIUM: 9.1 mg/dL (ref 8.9–10.3)
CO2: 23 mmol/L (ref 22–32)
Chloride: 100 mmol/L — ABNORMAL LOW (ref 101–111)
Creatinine, Ser: 0.54 mg/dL (ref 0.44–1.00)
Glucose, Bld: 108 mg/dL — ABNORMAL HIGH (ref 65–99)
POTASSIUM: 4.2 mmol/L (ref 3.5–5.1)
Sodium: 132 mmol/L — ABNORMAL LOW (ref 135–145)
Total Protein: 6.7 g/dL (ref 6.5–8.1)

## 2017-03-09 LAB — URINALYSIS, COMPLETE (UACMP) WITH MICROSCOPIC
BILIRUBIN URINE: NEGATIVE
Glucose, UA: NEGATIVE mg/dL
HGB URINE DIPSTICK: NEGATIVE
KETONES UR: NEGATIVE mg/dL
Leukocytes, UA: NEGATIVE
NITRITE: NEGATIVE
PROTEIN: 30 mg/dL — AB
Specific Gravity, Urine: 1.024 (ref 1.005–1.030)
pH: 6 (ref 5.0–8.0)

## 2017-03-09 MED ORDER — DIPHENHYDRAMINE HCL 50 MG/ML IJ SOLN
50.0000 mg | Freq: Once | INTRAMUSCULAR | Status: AC
  Administered 2017-03-09: 50 mg via INTRAVENOUS
  Filled 2017-03-09: qty 1

## 2017-03-09 MED ORDER — SODIUM CHLORIDE 0.9 % IV SOLN
75.0000 mg/m2 | Freq: Once | INTRAVENOUS | Status: AC
  Administered 2017-03-09: 120 mg via INTRAVENOUS
  Filled 2017-03-09: qty 12

## 2017-03-09 MED ORDER — ACETAMINOPHEN 325 MG PO TABS
650.0000 mg | ORAL_TABLET | Freq: Once | ORAL | Status: AC
  Administered 2017-03-09: 650 mg via ORAL
  Filled 2017-03-09: qty 2

## 2017-03-09 MED ORDER — DEXTROSE 5 % IV SOLN: 75.0000 mg/m2 | Freq: Once | INTRAVENOUS | Status: DC

## 2017-03-09 MED ORDER — DEXAMETHASONE SODIUM PHOSPHATE 10 MG/ML IJ SOLN
10.0000 mg | Freq: Once | INTRAMUSCULAR | Status: AC
  Administered 2017-03-09: 10 mg via INTRAVENOUS
  Filled 2017-03-09: qty 1

## 2017-03-09 MED ORDER — SODIUM CHLORIDE 0.9 % IV SOLN
10.0000 mg/kg | Freq: Once | INTRAVENOUS | Status: AC
  Administered 2017-03-09: 600 mg via INTRAVENOUS
  Filled 2017-03-09: qty 50

## 2017-03-09 MED ORDER — SODIUM CHLORIDE 0.9 % IV SOLN
10.0000 mg | Freq: Once | INTRAVENOUS | Status: DC
  Filled 2017-03-09: qty 1

## 2017-03-09 MED ORDER — OXYCODONE-ACETAMINOPHEN 10-325 MG PO TABS: 1.0000 | ORAL_TABLET | ORAL | 0 refills | Status: DC | PRN

## 2017-03-09 MED ORDER — HEPARIN SOD (PORK) LOCK FLUSH 100 UNIT/ML IV SOLN
500.0000 [IU] | Freq: Once | INTRAVENOUS | Status: AC | PRN
  Administered 2017-03-09: 500 [IU]
  Filled 2017-03-09: qty 5

## 2017-03-09 MED ORDER — SODIUM CHLORIDE 0.9 % IV SOLN
Freq: Once | INTRAVENOUS | Status: AC
  Administered 2017-03-09: 10:00:00 via INTRAVENOUS
  Filled 2017-03-09: qty 1000

## 2017-03-09 MED ORDER — IBUPROFEN 400 MG PO TABS: 400.0000 mg | ORAL_TABLET | Freq: Three times a day (TID) | ORAL | 0 refills | Status: DC | PRN

## 2017-03-09 NOTE — Progress Notes (Signed)
[facility] OFFICE PROGRESS NOTE  Patient Care Team: Mar Daring, PA-C as PCP - General (Family Medicine) Allyne Gee, MD as Referring Physician (Internal Medicine)  Malignant neoplasm of trachea, bronchus, and lung [facility])   Staging form: Lung, AJCC 7th Edition     Clinical: Stage IIIB (T4, N2, M0) - Signed by Cammie Sickle, MD on 06/24/2015    Oncology History   # FEB 2017-[place] CA Right Lower Lung T4N2M0- STAGE IIIB [Bronch & Subcarinal LNBx]- March 8th START CARBO-ALIMTA x4 cycles- June 8th 2017 - IMPROVED FDG MULTI-FOCAL lesions/N2-LN activity. S/p [place]-- Alimta x6  # AUG 18th- [place]- Avastin maintenance; SEP 13th DISCONTINUE AVASTIN [sec to cavitary lesion]; OCT 20th 2017- CT-STABLE Disease; NOV 8th CT/PET [facility] clinic]- "overall slight progression-? Lymphangiectatic spread"  # Jan - April 2018[facility]; progression  # April 25th 2018- Car[place]- Taxol x3 ccyles; June 2018- CT chest STABLE Disease;July 25th- Add Avastin to [place] taxol x6 cycles- Sep 2018-CT scan- [facility] STABLE   # Sep 2018- Avastin Maintence;   JAN 9th CT- right lung progression [s/p Bx- [place]; [facility]; [facility]- 1/15]  # NOV 2nd 2017-MRI, [facility]- Brain mets [asymptomatic;Right frontal ~53m; ~353mlesions -appx 3-4 lesions; (right Frontal & Left parietal s/p GK x 2 lesions] ; June 2018- Progession vs radiation necrosis [July 2th-Add Avastin]  # [facility] 4 small subcentimeter brain lesions for which she had the Gamma knife. The main right frontal lesion gotten smaller; improve edema.  # Jan 2019- Progression [CT chest; [facility]]; Brain MRI- new sub cm lesions- monitored  # feb 14th 2019- Tax-Cyram   --------------------------------------------------------------     MOLECULAR STUDIES:  KRAS MUTATED/Tissues not sufficient for OTHER the molecular marker; will need repeat Bx ; GUARDIANT TESTING [[facility]]- pending. [phone]  [facility] No targettable mutations.      Primary cancer of right lower lobe of lung ([facility]    INTERVAL HISTORY: Deanna Fullam89.o.  female pleasant patient above history of Recurrent Metastatic adenocarcinoma the lung; And brain metastases- Currently on Taxotere-Cyramza status post cycle #1 is here for follow-up.  Patient complains of multiple symptoms posttreatment-however she noted to have intermittent headaches -which improved with oxycodone.  She also complained of diffuse body aches; question related to Neulasta [patient had been on oxycodone/ibuprofen].   Patient denies any worsening shortness of breath or cough.  Denies any chest pain.  No back pain.  Denies any swelling in the legs.   REVIEW OF SYSTEMS:  A complete 10 point review of system is done which is negative except mentioned above/history of present illness.   PAST MEDICAL HISTORY :  Past Medical History:  Diagnosis Date  . Anxiety   . Cancer of right lung ([facility]/09/2015, 01/13/17  . Depression   . Depression with anxiety    Well controlled with Wellbutrin and Buspar  . Pneumonia     PAST SURGICAL HISTORY :   Past Surgical History:  Procedure Laterality Date  . ECTOPIC PREGNANCY SURGERY  1988  . ELECTROMAGNETIC NAVIGATION BROCHOSCOPY N/A 02/10/2015   Procedure: ELECTROMAGNETIC NAVIGATION BRONCHOSCOPY;  Surgeon: KuFlora LippsMD;  Location: [facility] ORS;  Service: Cardiopulmonary;  Laterality: N/A;  . ENDOBRONCHIAL ULTRASOUND N/A 02/10/2015   Procedure: ENDOBRONCHIAL ULTRASOUND;  Surgeon: KuFlora LippsMD;  Location: [facility] ORS;  Service: Cardiopulmonary;  Laterality: N/A;  . PERIPHERAL VASCULAR CATHETERIZATION N/A 03/05/2015   Procedure: PoGlori Luisath  Insertion;  Surgeon: Katha Cabal, MD;  Location: [place] [facility] LAB;  Service: Cardiovascular;  Laterality: N/A;    FAMILY HISTORY :   Family History  Problem Relation Age of Onset  . Cancer Father        Prostate Cancer  .  Hypertension Father   . Stroke Father   . Hypertension Brother     SOCIAL HISTORY:   Social History   Tobacco Use  . Smoking status: Former Smoker    Packs/day: 0.25    Years: 30.00    Pack years: 7.50    Types: Cigarettes    Last attempt to quit: 02/06/2005    Years since quitting: 12.0  . Smokeless tobacco: Never Used  Substance Use Topics  . Alcohol use: Yes    Alcohol/week: 3.0 oz    Types: 5 Cans of beer per week    Comment: 5 beers weekly  . Drug use: No    ALLERGIES:  has No Known Allergies.  MEDICATIONS:  Current Outpatient Medications  Medication Sig Dispense Refill  . acetaminophen (TYLENOL) 500 MG tablet Take 1,000 mg by mouth every 4 (four) hours as needed.     . docusate sodium (COLACE) 100 MG capsule Take 100 mg by mouth 2 (two) times daily.    . Fluticasone-Umeclidin-Vilant (TRELEGY ELLIPTA) 100-62.5-25 MCG/INH AEPB Inhale 1 puff into the lungs daily. 1 each 4  . gabapentin (NEURONTIN) 100 MG capsule Take 2 capsules (200 mg total) by mouth 3 (three) times daily. 180 capsule 6  . lidocaine-prilocaine (EMLA) cream Apply 1 application topically as needed. 30 g 6  . magic mouthwash w/lidocaine SOLN Take 5 mLs by mouth 4 (four) times daily. 480 mL 3  . ondansetron (ZOFRAN) 8 MG tablet Take 1 tablet (8 mg total) by mouth every 8 (eight) hours as needed for nausea or vomiting (start 3 days; after chemo). 40 tablet 1  . Oxycodone HCl 10 MG TABS Take 1 tablet (10 mg total) by mouth every 6 (six) hours as needed (moderate to severe pain). 60 tablet 0  . prochlorperazine (COMPAZINE) 10 MG tablet Take 1 tablet (10 mg total) by mouth every 6 (six) hours as needed for nausea or vomiting. 25 tablet 1  . zolpidem (AMBIEN) 5 MG tablet TAKE 1 TABLET BY MOUTH AT BEDTIME AS NEEDED FOR SLEEP 30 tablet 2  . dexamethasone (DECADRON) 4 MG tablet Take 1 tablet (4 mg total) by mouth 2 (two) times daily with a meal. Start the day prior to chemo; for 3 days. (Patient not taking: Reported on  02/28/2017) 60 tablet 3  . ibuprofen (IBU) 400 MG tablet Take 1 tablet (400 mg total) by mouth 3 (three) times daily as needed. 30 tablet 0  . oxyCODONE-acetaminophen (PERCOCET) 10-325 MG tablet Take 1 tablet by mouth every 4 (four) hours as needed for pain. 40 tablet 0   No current facility-administered medications for this visit.    Facility-Administered Medications Ordered in Other Visits  Medication Dose Route Frequency Provider Last Rate Last Dose  . DOCEtaxel (TAXOTERE) 120 mg in sodium chloride 0.9 % 250 mL chemo infusion  75 mg/m2 (Treatment Plan Recorded) Intravenous Once Cammie Sickle, MD 262 mL/hr at 03/09/17 1215 120 mg at 03/09/17 1215    PHYSICAL EXAMINATION: ECOG PERFORMANCE STATUS: 0 - Asymptomatic  BP (!) 151/97   Pulse 61   Temp 97.9 F (36.6 C) (Tympanic)   Resp 20   Ht 5' 3"  (1.6 m)   Wt 128 lb  14.4 oz (58.5 kg)   LMP 06/16/2000 (Approximate) Comment: 15 yrs ago  BMI 22.83 kg/m   Filed Weights   03/09/17 0923  Weight: 128 lb 14.4 oz (58.5 kg)    GENERAL: Well-nourished well-developed; Alert, no distress and comfortable. She is accompanied by her husband.  EYES: no pallor or icterus OROPHARYNX: no thrush or ulceration; good dentition  NECK: supple, no masses felt LYMPH:  no palpable lymphadenopathy in the cervical, axillary or inguinal regions LUNGS: clear to auscultation and  No wheeze or crackles HEART/CVS: regular rate & rhythm and no murmurs; No lower extremity edema;No erythema. ABDOMEN:abdomen soft, non-tender and normal bowel sounds Musculoskeletal:no cyanosis of digits and no clubbing  PSYCH: alert & oriented x 3 with fluent speech.  NEURO: no focal motor/sensory deficits SKIN:  no rashes or significant lesions  LABORATORY DATA:  I have reviewed the data as listed    Component Value Date/Time   NA 132 (L) 03/09/2017 0858   NA 139 01/03/2015 0910   K 4.2 03/09/2017 0858   CL 100 (L) 03/09/2017 0858   CO2 23 03/09/2017 0858   GLUCOSE  108 (H) 03/09/2017 0858   BUN 33 (H) 03/09/2017 0858   BUN 10 01/03/2015 0910   CREATININE 0.54 03/09/2017 0858   CALCIUM 9.1 03/09/2017 0858   PROT 6.7 03/09/2017 0858   PROT 6.5 01/03/2015 0910   ALBUMIN 3.8 03/09/2017 0858   ALBUMIN 3.8 01/03/2015 0910   AST 28 03/09/2017 0858   ALT 22 03/09/2017 0858   ALKPHOS 67 03/09/2017 0858   BILITOT 0.6 03/09/2017 0858   BILITOT <0.2 01/03/2015 0910   GFRNONAA >60 03/09/2017 0858   GFRAA >60 03/09/2017 0858    No results found for: SPEP, UPEP  Lab Results  Component Value Date   WBC 16.9 (H) 03/09/2017   NEUTROABS 14.5 (H) 03/09/2017   HGB 13.1 03/09/2017   HCT 37.7 03/09/2017   MCV 106.1 (H) 03/09/2017   PLT 268 03/09/2017      Chemistry      Component Value Date/Time   NA 132 (L) 03/09/2017 0858   NA 139 01/03/2015 0910   K 4.2 03/09/2017 0858   CL 100 (L) 03/09/2017 0858   CO2 23 03/09/2017 0858   BUN 33 (H) 03/09/2017 0858   BUN 10 01/03/2015 0910   CREATININE 0.54 03/09/2017 0858      Component Value Date/Time   CALCIUM 9.1 03/09/2017 0858   ALKPHOS 67 03/09/2017 0858   AST 28 03/09/2017 0858   ALT 22 03/09/2017 0858   BILITOT 0.6 03/09/2017 0858   BILITOT <0.2 01/03/2015 0910     09/14/2016 [compared to June 2018]  FINDINGS: There is a semisolid mass with underlying cystic changes and cavitation in the superior segment of the right lower lobe measuring slightly greater than 6 cm in size. This has not changed significantly since the prior outside examination. Peripheral groundglass and airspace infiltrates in the right lower lobe inferiorly are stable as are the groundglass infiltrates in the inferior right upper lobe, some of which contain cystic changes. Mild interlobular septal thickening in the right upper lobe persists.  2 mm lingular nodule (2/157) is unchanged. Right perihilar and lower lobe bronchial wall thickening is again noted. Right IJ Port-A-Cath tip in the upper RA. Old right rib fracture.  Mild emphysematous changes in the upper lungs. No thoracic adenopathy by size criteria. ---------------------------------------------------------- 09/14/2016- IMPRESSION:  No CT evidence of metastatic disease in the abdomen and pelvis.   RADIOGRAPHIC STUDIES:  I have personally reviewed the radiological images as listed and agreed with the findings in the report. No results found.   ASSESSMENT & PLAN:  Primary cancer of right lower lobe of lung ([place]) T4 N2 M1-adenocarcinoma of the lung right side-on cabo-Taxol- Avastin. JAN 9th CT Chest [facility] clinic] Progression  Of lung disease vs ? Infection  [less likley]; MRI Brain-improved right frontal lesion; new 2 mm lesion noted in right paietal lobe.   # Currently on Tax-Ram cycle #1- tolerate with difficulties [see discussion below].  # Proceed with cycle #2 of Ram-Tax; Labs today reviewed;  acceptable for treatment today.    # Hx of brain metastases- s/p GK in [facility]; but new 2 mm lesion noted; No GK-we will repeat the scan in April [see discussion below]   # Elevated Blood pressure- get cuff at home; keep a log-call us if elevated blood pressure because of? Headaches. Will call if worse; then get MRI.   # PN-G-1.  Sec to taxol; neurotin TID-improving.  # Myalgias- ? Sec to tax/ neulasta- recommend NSAIDs/claritin/ oxycodone.   # follow up in 3 weeks; Tax-Ram;  labs/MD; UA.   Orders Placed This Encounter  Procedures  . CBC with Differential    Standing Status:   Future    Standing Expiration Date:   03/10/2018  . Comprehensive metabolic panel    Standing Status:   Future    Standing Expiration Date:   03/10/2018     Cammie Sickle, MD 03/09/2017 1:03 PM

## 2017-03-09 NOTE — Progress Notes (Signed)
Patient c/o intermittent headaches and fatigue and insomnia.

## 2017-03-09 NOTE — Assessment & Plan Note (Addendum)
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 JAN 9th CT Chest [facility] clinic] Progression  Of lung disease vs ? Infection  [less likley]; MRI Brain-improved right frontal lesion; new 2 mm lesion noted in right paietal lobe.   # Currently on Tax-Ram cycle #1- tolerate with difficulties [see discussion below].  # Proceed with cycle #2 of Ram-Tax; Labs today reviewed;  acceptable for treatment today.    # Hx of brain metastases- s/p GK in [facility]; but new 2 mm lesion noted; No GK-we will repeat the scan in April [see discussion below]   # Elevated Blood pressure- get cuff at home; keep a log-call us if elevated blood pressure because of? Headaches. Will call if worse; then get MRI.   # PN-G-1.  Sec to taxol; neurotin TID-improving.  # Myalgias- ? Sec to tax/ neulasta- recommend NSAIDs/claritin/ oxycodone.   # follow up in 3 weeks; Tax-Ram;  labs/MD; UA.

## 2017-03-10 ENCOUNTER — Inpatient Hospital Stay: Payer: BLUE CROSS/BLUE SHIELD

## 2017-03-10 ENCOUNTER — Other Ambulatory Visit: Payer: BLUE CROSS/BLUE SHIELD

## 2017-03-10 ENCOUNTER — Ambulatory Visit: Payer: BLUE CROSS/BLUE SHIELD

## 2017-03-10 ENCOUNTER — Ambulatory Visit: Payer: BLUE CROSS/BLUE SHIELD | Admitting: Internal Medicine

## 2017-03-10 DIAGNOSIS — C3431 Malignant neoplasm of lower lobe, right bronchus or lung: Secondary | ICD-10-CM

## 2017-03-10 MED ORDER — PEGFILGRASTIM-CBQV 6 MG/0.6ML ~~LOC~~ SOSY
6.0000 mg | PREFILLED_SYRINGE | Freq: Once | SUBCUTANEOUS | Status: AC
  Administered 2017-03-10: 6 mg via SUBCUTANEOUS
  Filled 2017-03-10: qty 0.6

## 2017-03-11 ENCOUNTER — Ambulatory Visit: Payer: BLUE CROSS/BLUE SHIELD

## 2017-03-16 ENCOUNTER — Inpatient Hospital Stay: Payer: BLUE CROSS/BLUE SHIELD

## 2017-03-16 ENCOUNTER — Telehealth: Payer: Self-pay | Admitting: *Deleted

## 2017-03-16 ENCOUNTER — Inpatient Hospital Stay (HOSPITAL_BASED_OUTPATIENT_CLINIC_OR_DEPARTMENT_OTHER): Payer: BLUE CROSS/BLUE SHIELD | Admitting: Nurse Practitioner

## 2017-03-16 ENCOUNTER — Encounter: Payer: Self-pay | Admitting: Nurse Practitioner

## 2017-03-16 VITALS — BP 140/78 | HR 105 | Temp 97.9°F | Resp 20 | Wt 119.0 lb

## 2017-03-16 DIAGNOSIS — C3431 Malignant neoplasm of lower lobe, right bronchus or lung: Secondary | ICD-10-CM

## 2017-03-16 DIAGNOSIS — E860 Dehydration: Secondary | ICD-10-CM

## 2017-03-16 DIAGNOSIS — K1231 Oral mucositis (ulcerative) due to antineoplastic therapy: Secondary | ICD-10-CM | POA: Diagnosis not present

## 2017-03-16 DIAGNOSIS — R21 Rash and other nonspecific skin eruption: Secondary | ICD-10-CM | POA: Diagnosis not present

## 2017-03-16 DIAGNOSIS — E43 Unspecified severe protein-calorie malnutrition: Secondary | ICD-10-CM

## 2017-03-16 DIAGNOSIS — G8918 Other acute postprocedural pain: Secondary | ICD-10-CM

## 2017-03-16 DIAGNOSIS — M2550 Pain in unspecified joint: Secondary | ICD-10-CM

## 2017-03-16 DIAGNOSIS — Z95828 Presence of other vascular implants and grafts: Secondary | ICD-10-CM

## 2017-03-16 LAB — COMPREHENSIVE METABOLIC PANEL
ALBUMIN: 3.7 g/dL (ref 3.5–5.0)
ALT: 22 U/L (ref 14–54)
AST: 30 U/L (ref 15–41)
Alkaline Phosphatase: 84 U/L (ref 38–126)
Anion gap: 14 (ref 5–15)
BUN: 37 mg/dL — ABNORMAL HIGH (ref 6–20)
CO2: 21 mmol/L — AB (ref 22–32)
Calcium: 8.8 mg/dL — ABNORMAL LOW (ref 8.9–10.3)
Chloride: 102 mmol/L (ref 101–111)
Creatinine, Ser: 0.7 mg/dL (ref 0.44–1.00)
GFR calc Af Amer: >60 mL/min (ref >60)
GFR calc non Af Amer: >60 mL/min (ref >60)
GLUCOSE: 110 mg/dL — AB (ref 65–99)
POTASSIUM: 4.1 mmol/L (ref 3.5–5.1)
SODIUM: 137 mmol/L (ref 135–145)
Total Bilirubin: 0.7 mg/dL (ref 0.3–1.2)
Total Protein: 6.7 g/dL (ref 6.5–8.1)

## 2017-03-16 LAB — CBC WITH DIFFERENTIAL/PLATELET
BASOS ABS: 0 10*3/uL (ref 0–0.1)
Basophils Relative: 0 %
Eosinophils Absolute: 0 10*3/uL (ref 0–0.7)
Eosinophils Relative: 0 %
HEMATOCRIT: 37 % (ref 35.0–47.0)
HEMOGLOBIN: 12.9 g/dL (ref 12.0–16.0)
LYMPHS PCT: 8 %
Lymphs Abs: 0.8 10*3/uL — ABNORMAL LOW (ref 1.0–3.6)
MCH: 36.8 pg — ABNORMAL HIGH (ref 26.0–34.0)
MCHC: 34.9 g/dL (ref 32.0–36.0)
MCV: 105.3 fL — AB (ref 80.0–100.0)
MONO ABS: 0.3 10*3/uL (ref 0.2–0.9)
MONOS PCT: 3 %
Neutro Abs: 9 10*3/uL — ABNORMAL HIGH (ref 1.4–6.5)
Neutrophils Relative %: 89 %
Platelets: 111 10*3/uL — ABNORMAL LOW (ref 150–440)
RBC: 3.51 MIL/uL — ABNORMAL LOW (ref 3.80–5.20)
RDW: 13.8 % (ref 11.5–14.5)
WBC: 10.2 10*3/uL (ref 3.6–11.0)

## 2017-03-16 LAB — MAGNESIUM: Magnesium: 2.1 mg/dL (ref 1.7–2.4)

## 2017-03-16 MED ORDER — FENTANYL 25 MCG/HR TD PT72: 25.0000 ug | MEDICATED_PATCH | TRANSDERMAL | 0 refills | Status: DC

## 2017-03-16 MED ORDER — HEPARIN SOD (PORK) LOCK FLUSH 100 UNIT/ML IV SOLN
500.0000 [IU] | Freq: Once | INTRAVENOUS | Status: AC
  Administered 2017-03-16: 500 [IU] via INTRAVENOUS
  Filled 2017-03-16: qty 5

## 2017-03-16 MED ORDER — SODIUM CHLORIDE 0.9% FLUSH
10.0000 mL | INTRAVENOUS | Status: DC | PRN
  Administered 2017-03-16: 10 mL via INTRAVENOUS
  Filled 2017-03-16: qty 10

## 2017-03-16 MED ORDER — SODIUM CHLORIDE 0.9 % IV SOLN
Freq: Once | INTRAVENOUS | Status: AC
Start: 2017-03-16 — End: 2017-03-16
  Administered 2017-03-16: 12:00:00 via INTRAVENOUS
  Filled 2017-03-16: qty 1000

## 2017-03-16 MED ORDER — FLUCONAZOLE 40 MG/ML PO SUSR: 200.0000 mg | Freq: Every day | ORAL | 1 refills | Status: AC

## 2017-03-16 NOTE — Patient Instructions (Addendum)
I've sent prescriptions for fentanyl patches and diflucan suspension. I've also included some information about mouth sores. Pick up a soft tooth brush for mouth care. You can use Desitin on your bottom. Please notify me if you've had no improvement in your symptoms, or symptoms become worse, in the next 2-3 days.   Stomatitis Stomatitis is a condition that causes inflammation in your mouth. It can affect a part of your mouth or your whole mouth. The condition often affects your cheek, teeth, gums, lips, and tongue. Stomatitis can also affect the mucous membranes that surround your mouth (mucosa). Pain from stomatitis can make it hard for you to eat or drink. Severe cases of this condition can lead to dehydration or poor nutrition. What are the causes? Common causes of this condition include:  Viruses, such as cold sores or oral herpes and shingles.  Canker sores.  Bacterial infections.  Fungus or yeast infections, such as oral thrush.  Not getting adequate nutrition.  Injury to your mouth. This can be from: ? Dentures or braces that do not fit well. ? Biting your tongue or cheek. ? Burning your mouth. ? Having sharp or broken teeth.  Gum disease.  Using tobacco, especially chewing tobacco.  Allergies to foods, medicines, or substances that are used in your mouth.  Medicines, including cancer medicines (chemotherapy), antihistamines, and seizure medicines.  In some cases, the cause may not be known. What increases the risk? This condition is more likely to develop in people who:  Have poor oral hygiene or poor nutrition.  Have any condition that causes a dry mouth.  Are under a lot of physical or emotional stress.  Have any condition that weakens the body's defense system (immune system).  Are being treated for cancer.  Smoke.  What are the signs or symptoms? The most common symptoms of this condition are pain, swelling, and redness inside your mouth. The pain may  feel like burning or stinging. It may get worse from eating or drinking. Other symptoms include:  Painful, shallow sores (ulcers) in the mouth.  Blisters in the mouth.  Bleeding gums.  Swollen gums.  Irritability and fatigue.  Bad breath.  Bad taste in the mouth.  Fever.  How is this diagnosed? This condition is diagnosed with a physical exam to check for bleeding gums and mouth ulcers. You may also have other tests, including:  Blood tests to look for infection or vitamin deficiencies.  Mouth swab to get a fluid sample to test for bacteria (culture).  Tissue sample from an ulcer to examine under a microscope (biopsy).  How is this treated? Treatment for stomatitis depends on the cause. Treatment may include medicines, such as:  Over-the counter (OTC) pain medicines.  Topical anesthetic to numb the area if you have severe pain.  Antibiotics to treat a bacterial infection.  Antifungals to treat a fungal infection.  Antivirals to treat a viral infection.  Mouth rinses that contain steroids to reduce the swelling in your mouth.  Other medicines to coat or numb your mouth.  Follow these instructions at home: Medicines  Take medicines only as directed by your health care provider.  If you were prescribed an antibiotic, finish all of it even if you start to feel better. Lifestyle  Practice good oral hygiene: ? Gently brush your teeth with a soft, nylon-bristled toothbrush two times each day. ? Floss your teeth every day. ? Have your teeth cleaned regularly, as recommended by your dentist.  Eat a balanced diet.Do  not eat: ? Spicy foods. ? Citrus, such as oranges. ? Foods that have sharp edges, such as chips.  Avoid any foods or other allergens that you think may be causing your stomatitis.  If you have dentures, make sure that they are properly fitted.  Do not use any tobacco products, including cigarettes, chewing tobacco, or electronic cigarettes. If you  need help quitting, ask your health care provider.  Find ways to reduce stress. Try yoga or meditation. Ask your health care provider for other ideas. General instructions  Use a salt-water rinse for pain as directed by your health care provider. Mix 1 tsp of salt in 2 cups of water.  Drink enough fluid to keep your urine clear or pale yellow. This will keep you hydrated. Contact a health care provider if:  Your symptoms get worse.  You develop new symptoms, especially: ? A rash. ? New symptoms that do not involve your mouth area.  Your symptoms last longer than three weeks.  Your stomatitis goes away and then returns.  You have a harder time eating and drinking normally.  You have increasing fatigue or weakness.  You lose your appetite or you feel nauseous.  You have a fever. This information is not intended to replace advice given to you by your health care provider. Make sure you discuss any questions you have with your health care provider. Document Released: 10/18/2006 Document Revised: 08/20/2015 Document Reviewed: 12/17/2013 Elsevier Interactive Patient Education  2018 [employer].  Oral Mucositis Oral mucositis is a mouth condition that may develop from treatments for cancer. With this condition, sores may appear on your lips, gums, tongue, throat, and the top (roof) or bottom (floor) of your mouth. What are the causes? Oral mucositis can happen to anyone who is being treated with cancer therapies, including:  Cancer medicines (chemotherapy).  Radiation therapy.  Bone marrow transplants and stem cell transplants.  Cancer treatments can damage the lining of the mouth, and that causes this condition. Oral mucositis is not caused by infection. However, the sores can become infected after they form. Infection can make oral mucositis worse. What increases the risk? This condition is more likely to develop in people:  With poor oral hygiene.  With dental problems  or oral diseases.  Who use any tobacco product, including cigarettes, chewing tobacco, and electronic cigarettes.  Who drink alcohol.  Who have other medical conditions, such as diabetes, HIV, AIDS, or kidney disease.  Who do not drink enough clear fluids.  Who wear dentures that do not fit correctly.  Who have cancers that primarily affect the blood.  Who are female.  What are the signs or symptoms? Symptoms can vary from mild to severe. Symptoms are usually seen 7-10 days after cancer treatment has started. Symptoms include:  Mouth sores. These sores may bleed.  Color changes inside the mouth. Red, shiny areas may appear.  White patches or pus in the mouth.  Pain in the mouth and throat. This can make it painful to speak.  Dryness and a burning feeling in the mouth.  Saliva that is dry and thick.  Trouble eating, drinking, and swallowing. This can lead to weight loss.  How is this diagnosed? This condition can be diagnosed with a physical exam. How is this treated? Treatment depends on the severity of the condition. Oral mucositis often heals on its own. Sometimes, changes in the cancer treatment can help. Treatment may include medicines, such as:  An antibiotic medicine to fight infection, if  present.  Medicine to help the cells in your mouth heal more quickly.  Medicine may also be given to help control pain. This may include:  Pain relievers that are swished around in the mouth. These make the mouth numb to ease the pain (topical anesthetics).  Mouth rinses.  Prescribed, medicated gels. The gel coats the mouth. This protects nerve endings and lessens pain.  Narcotic pain medicines.  Follow these instructions at home: Medicines  Do not use products that contain benzocaine (including numbing gels) to treat teething or mouth pain in children who are younger than 2 years. These products may cause a rare but serious blood condition.  If you were prescribed an  antibiotic medicine, finish all of it even if you start to feel better.  Take or apply medicines only as directed by your health care provider. Lifestyle  Keeping your mouth clean and germ-free is important. To maintain good oral hygiene: ? Brush your teeth carefully with a soft, nylon-bristled toothbrush at least two times each day. Use a gentle toothpaste. Ask your health care provider for a toothpaste recommendation. ? Floss your teeth every day. ? Have your teeth cleaned regularly as recommended by your dentist. ? Rinse your mouth after every meal or as directed by your health care provider. Do not use mouthwash that contains alcohol. Ask your health care provider for a mouthwash recommendation.  Do not use any tobacco products, including cigarettes, chewing tobacco, and electronic cigarettes. If you need help quitting, ask your health care provider.  Avoid eating: ? Hot and spicy foods. ? Citrus. ? Foods that have sharp edges, such as chips. ? Sugary foods, such as candy.  Do not drink alcohol. General instructions  Clean the sores as directed by your health care provider.  If your lips are dry or cracked, apply a water-based moisturizer to your lips as needed.  Try sucking on ice chips or sugar-free frozen pops. This may help with pain. This also keeps your mouth moist.  Drink enough fluid to keep your urine clear or pale yellow.  If you are losing weight, talk with your health care provider.  If you have dentures, take them out often as directed by your health care provider.  Keep all follow-up visits as directed by your health care provider. This is important. Contact a health care provider if:  You have mouth pain or throat pain.  You are having more trouble swallowing.  Your symptoms get worse.  You have new symptoms.  Your pain is not controlled with medicine. Get help right away if:  You have a lot of bleeding in your mouth.  You have trouble  speaking.  You develop new, open, or draining sores in your mouth.  You cannot swallow solid food or liquids.  You have a fever. This information is not intended to replace advice given to you by your health care provider. Make sure you discuss any questions you have with your health care provider. Document Released: 08/07/2010 Document Revised: 05/28/2016 Document Reviewed: 12/17/2013 Elsevier Interactive Patient Education  2018 [employer]. Fentanyl skin patch What is this medicine? FENTANYL (FEN ta nil) is a pain reliever. It is used to treat persistent, moderate to severe chronic pain. It is used only by people who have been taking an opioid or narcotic pain medicine for more than one week. This medicine may be used for other purposes; ask your health care provider or pharmacist if you have questions. COMMON BRAND NAME(S): Duragesic What should I  tell my health care provider before I take this medicine? They need to know if you have any of these conditions: -brain tumor -Crohn's disease, inflammatory bowel disease, or ulcerative colitis -drug abuse or addiction -head injury -heart disease -if you frequently drink alcohol containing drinks -kidney disease or problems going to the bathroom -liver disease -lung disease, asthma, or breathing problems -mental problems -skin problems -taken isocarboxazid, phenelzine, tranylcypromine, or selegiline in the past 2 weeks -an allergic or unusual reaction to fentanyl, meperidine, other medicines, foods, dyes, or preservatives -pregnant or trying to get pregnant -breast-feeding How should I use this medicine? Apply the patch to your skin. Do not cut or damage the patch. A cut or damaged patch can be very dangerous because you may get too much medicine. Select a clean, dry area of skin above your waist on your front or back. The upper back is a good spot to put the patch on children or people who are confused because it will be hard for them  to remove the patch. Do not apply the patch to oily, broken, burned, cut, or irritated skin. Use only water to clean the area. Do not use soap or alcohol to clean the skin because this can increase the effects of the medicine. If the area is hairy, clip the hair with scissors, but do not shave. Take the patch out of its wrapper, and take off the protective strip over the sticky part. Do not use a patch if the packaging or backing is damaged. Do not touch the sticky part with your fingers. Press the sticky surface to the skin using the palm of your hand. Press the patch to the skin for 30 seconds. Wash your hands at once with soap and water. Keep patches far away from children. Do not let children see you apply the patch and do not apply it where children can see it. Do not call the patch a sticker, tattoo, or bandage, as this could encourage the child to mimic your actions. Used patches still contain medicine. Children or pets can have serious side effects or die from putting used patches in their mouth or on their bodies. Take off the old patch before putting on a new patch. Apply each new patch to a different area of skin. If a patch comes off or causes irritation, remove it and apply a new patch to different site. If the edges of the patch start to loosen, first apply first aid tape to the edges of the patch. If problems with the patch not sticking continue, cover the patch with a see-through adhesive dressing (like Bioclusive or Tegaderm). Never cover the patch with any other bandage or tape. To get rid of used patches, fold the patch in half with the sticky sides together. Then, flush it down the toilet. Do not discard the patch in the garbage. Pets and children can be harmed if they find used or lost patches. Replace the patch every 3 days or as directed by your doctor or health care professional. Follow the directions on the prescription label. Do not take more medicine than you are told to take. A  special MedGuide will be given to you by the pharmacist with each prescription and refill. Be sure to read this information carefully each time. Talk to your pediatrician regarding the use of this medicine in children. While this drug may be prescribed for children as young as 15 years old for selected conditions, precautions do apply. If someone accidentally uses a  fentanyl patch and is not awake and alert, immediately call 911 for help. If the person is awake and alert, call a doctor, health care professional, or the [employer]. Overdosage: If you think you have taken too much of this medicine contact a poison control center or emergency room at once. NOTE: This medicine is only for you. Do not share this medicine with others. What if I miss a dose? If you forget to replace your patch, take off the old patch and put on a new patch as soon as you can. Do not apply an extra patch to your skin. Do not wear more than one patch at the same time unless told to do so by your doctor or health care professional. What may interact with this medicine? Do not take this medication with any of the following medicines: -mifepristone This medicine may also interact with the following medications: -alcohol -antihistamines for allergy, cough and cold -antiviral medicines for HIV or AIDS -aprepitant -atropine -certain antibiotics like erythromycin and clarithromycin -certain medicines for anxiety or sleep -certain medicines for bladder problems like oxybutynin, tolterodine -certain medicines for blood pressure, heart disease, irregular heart beat -certain medicines for depression like amitriptyline, fluoxetine, sertraline -certain medicines for diabetes like pioglitazone, troglitazone -certain medicines for fungal infections like ketoconazole and itraconazole -certain medicines for seizures like phenobarbital, phenytoin, primidone -certain medicines for stomach problems like dicyclomine,  hyoscyamine -certain medicines for travel sickness like scopolamine -certain medicines for Parkinson's disease like benztropine, trihexyphenidyl -cimetidine -general anesthetics like halothane, isoflurane, methoxyflurane, propofol -grapefruit juice -ipratropium -local anesthetics like lidocaine, pramoxine, tetracaine -MAOIs like Carbex, Eldepryl, Marplan, Nardil, and Parnate -medicines that relax muscles for surgery -other narcotic medicines for pain or cough -phenothiazines like chlorpromazine, mesoridazine, prochlorperazine, thioridazine -rifampin -St. John's wort -steroid medicines like prednisone or cortisone This list may not describe all possible interactions. Give your health care provider a list of all the medicines, herbs, non-prescription drugs, or dietary supplements you use. Also tell them if you smoke, drink alcohol, or use illegal drugs. Some items may interact with your medicine. What should I watch for while using this medicine? Tell your doctor or health care professional if your pain does not go away, if it gets worse, or if you have new or a different type of pain. You may develop tolerance to the medicine. Tolerance means that you will need a higher dose of the medicine for pain relief. Tolerance is normal and is expected if you take the medicine for a long time. Do not suddenly stop taking your medicine because you may develop a severe reaction. Your body becomes used to the medicine. This does NOT mean you are addicted. Addiction is a behavior related to getting and using a drug for a non-medical reason. If you have pain, you have a medical reason to take pain medicine. Your doctor will tell you how much medicine to take. If your doctor wants you to stop the medicine, the dose will be slowly lowered over time to avoid any side effects. There are different types of narcotic medicines (opiates). If you take more than one type at the same time or you are taking another medicine  that also causes drowsiness, you may have more side effects. Give your health care provider a list of all medicines you use. Your doctor will tell you how much medicine to take. Do not take more medicine than directed. Call emergency for help if you have problems breathing or unusual sleepiness. This medicine patch is  sensitive to certain body heat changes. If your skin gets too hot, more medicine will come out of the patch and can cause a deadly overdose. Call your healthcare provider if you get a fever. Do not take hot baths. Do not sunbathe. Do not use hot tubs, saunas, hair dryers, heating pads, electric blankets, heated waterbeds, or tanning lamps. Do not do exercise that increases your body temperature. If you are going to need surgery, a MRI, CT scan, or other procedure, tell your doctor that you are using this medicine. You may need to remove this patch before the procedure. You may get drowsy or dizzy. Do not drive, use machinery, or do anything that may be dangerous until you know how the medicine affects you. Do not stand or sit up quickly, especially if you are an older patient. This reduces the risk of dizzy or fainting spells. Alcohol may interfere with the effect of this medicine. Avoid alcoholic drinks. The medicine will cause constipation. Try to have a bowel movement at least every 2 to 3 days. If you do not have a bowel movement for 3 days, call your doctor or health care professional. Your mouth may get dry. Chewing sugarless gum or sucking hard candy, and drinking plenty of water may help. Contact your doctor if the problem does not go away or is severe. What side effects may I notice from receiving this medicine? Side effects that you should report to your doctor or health care professional as soon as possible: -allergic reactions like skin rash, itching or hives, swelling of the face, lips, or tongue -breathing problems -confusion -signs and symptoms of low blood pressure like  dizziness; feeling faint or lightheaded, falls; unusually weak or tired -trouble passing urine or change in the amount of urine Side effects that usually do not require medical attention (report to your doctor or health care professional if they continue or are bothersome): -constipation -dry mouth -itching at the site where the patch was applied -nausea, vomiting -tiredness This list may not describe all possible side effects. Call your doctor for medical advice about side effects. You may report side effects to FDA at 1-800-FDA-1088. Where should I keep my medicine? Keep out of the reach of children. This medicine can be abused. Keep your medicine in a safe place to protect it from theft. Do not share this medicine with anyone. Selling or giving away this medicine is dangerous and against the law. Store below 77 degrees F (25 degrees C). Do not store the patches out of their wrappers. This medicine may cause accidental overdose and death if it is taken by other adults, children, or pets. Flush any unused medicine down the toilet as instructed above to reduce the chance of harm. Do not use the medicine after the expiration date. NOTE: This sheet is a summary. It may not cover all possible information. If you have questions about this medicine, talk to your doctor, pharmacist, or health care provider.  2018 Elsevier/Gold Standard (2015-08-06 15:37:11) Fluconazole oral suspension What is this medicine? FLUCONAZOLE (floo KON na zole) is an antifungal medicine. It is used to treat or prevent certain kinds of fungal or yeast infections. This medicine may be used for other purposes; ask your health care provider or pharmacist if you have questions. COMMON BRAND NAME(S): Diflucan What should I tell my health care provider before I take this medicine? They need to know if you have any of these conditions: -history of irregular heart beat -kidney disease -an unusual  or allergic reaction to  fluconazole, other azole antifungals, medicines, foods, dyes, or preservatives -pregnant or trying to get pregnant -breast-feeding How should I use this medicine? Take this medicine by mouth. Follow the directions on the prescription label. Shake well before using. Use a specially marked spoon or container to measure the oral suspension. Ask your pharmacist if you do not have one. Household spoons are not accurate. Take with or without food. Take your medicine at regular intervals. Do not take your medicine more often than directed. Do not skip doses or stop your medicine early even if you feel better. Do not stop taking except on your doctor's advice. Overdosage: If you think you have taken too much of this medicine contact a poison control center or emergency room at once. NOTE: This medicine is only for you. Do not share this medicine with others. What if I miss a dose? If you miss a dose, take it as soon as you can. If it is almost time for your next dose, take only that dose. Do not take double or extra doses. What may interact with this medicine? Do not take this medicine with any of the following medications: -astemizole -certain medicines for irregular heart beat like dofetilide, dronedarone, quinidine -cisapride -erythromycin -lomitapide -other medicines that prolong the QT interval (cause an abnormal heart rhythm) -pimozide -terfenadine -thioridazine -tolvaptan -ziprasidone This medicine may also interact with the following medications: -antiviral medicines for HIV or AIDS -birth control pills -certain antibiotics like rifabutin, rifampin -certain medicines for blood pressure like amlodipine, isradipine, felodipine, hydrochlorothiazide, losartan, nifedipine -certain medicines for cancer like cyclophosphamide, vinblastine, vincristine -certain medicines for cholesterol like atorvastatin, lovastatin, fluvastatin, simvastatin -certain medicines for depression, anxiety, or  psychotic disturbances like amitriptyline, midazolam, nortriptyline, triazolam -certain medicines for diabetes like glipizide, glyburide, tolbutamide -certain medicines for pain like alfentanil, fentanyl, methadone -certain medicines for seizures like carbamazepine, phenytoin -certain medicines that treat or prevent blood clots like warfarin -halofantrine -medicines that lower your chance of fighting infection like cyclosporine, prednisone, tacrolimus -NSAIDS, medicines for pain and inflammation, like celecoxib, diclofenac, flurbiprofen, ibuprofen, meloxicam, naproxen -other medicines for fungal infections -sirolimus -theophylline -tofacitinib This list may not describe all possible interactions. Give your health care provider a list of all the medicines, herbs, non-prescription drugs, or dietary supplements you use. Also tell them if you smoke, drink alcohol, or use illegal drugs. Some items may interact with your medicine. What should I watch for while using this medicine? Visit your doctor or health care professional for regular checkups. If you are taking this medicine for a long time you may need blood work. Tell your doctor if your symptoms do not improve. Some fungal infections need many weeks or months of treatment to cure. Alcohol can increase possible damage to your liver. Avoid alcoholic drinks. If you have a vaginal infection, do not have sex until you have finished your treatment. You can wear a sanitary napkin. Do not use tampons. Wear freshly washed cotton, not synthetic, panties. What side effects may I notice from receiving this medicine? Side effects that you should report to your doctor or health care professional as soon as possible: -allergic reactions like skin rash or itching, hives, swelling of the lips, mouth, tongue, or throat -dark urine -feeling dizzy or faint -irregular heartbeat or chest pain -redness, blistering, peeling or loosening of the skin, including inside  the mouth -trouble breathing -unusual bruising or bleeding -vomiting -yellowing of the eyes or skin Side effects that usually do not require medical  attention (report to your doctor or health care professional if they continue or are bothersome): -changes in how food tastes -diarrhea -headache -stomach upset or nausea This list may not describe all possible side effects. Call your doctor for medical advice about side effects. You may report side effects to FDA at 1-800-FDA-1088. Where should I keep my medicine? Keep out of the reach of children. Store at room temperature between 5 and 30 degrees C (41 and 86 degrees F). Keep container tightly closed. Throw away any unused medicine after 2 weeks. NOTE: This sheet is a summary. It may not cover all possible information. If you have questions about this medicine, talk to your doctor, pharmacist, or health care provider.  2018 Elsevier/Gold Standard (2012-07-29 19:35:48)

## 2017-03-16 NOTE — Progress Notes (Signed)
Symptom Management Consult note [facility]  [phone] [phone]  Patient Care Team: Rubye Beach as PCP - General (Family Medicine) Allyne Gee, MD as Referring Physician (Internal Medicine)   Name of the patient: Deanna Blair  [id number]  28-Dec-1955   Date of visit: 03/16/17  Diagnosis- Malignancy neoplasm of trachea, bronchus, and lung- Stage IIIB  Chief complaint/ Reason for visit- Mouth Sores   Heme/Onc history: Patient last evaluated by primary oncologist, Dr. Rogue Bussing, on 03/09/17. She has below oncology history:  # FEB 2017-ADENO CA Right Lower Lung T4N2M0- STAGE IIIB [Bronch & Subcarinal LNBx]- March 8th START CARBO-ALIMTA x4 cycles- June 8th 2017 - IMPROVED FDG MULTI-FOCAL lesions/N2-LN activity. S/p [place]-- Alimta x6  # AUG 18th- Alimta- Avastin maintenance; SEP 13th DISCONTINUE AVASTIN [sec to cavitary lesion]; OCT 20th 2017- CT-STABLE Disease; NOV 8th CT/PET [facility]]- "overall slight progression-? Lymphangiectatic spread"  # Jan - April 2018[facility] Husbands; progression  # April 25th 2018- Carbo- Taxol x3 ccyles; June 2018- CT chest STABLE Disease;July 25th- Add Avastin to [place] taxol x6 cycles- Sep 2018-CT scan- [facility] STABLE.  # Sep 2018- Avastin Maintence  JAN 9th CT- right lung progression [s/p Bx- [place]; [facility]; F-one- 1/15  # NOV 2nd 2017-MRI, [facility]- Brain mets [asymptomatic;Right frontal ~53mm; ~5mm lesions -appx 3-4 lesions; (right Frontal & Left parietal s/p GK x 2 lesions] ; June 2018- Progession vs radiation necrosis [July 2th-Add Avastin]  # Oct 2018[facility]] 4 small subcentimeter brain lesions for which she had the Gamma knife. The main right frontal lesion gotten smaller; edema improved  # Jan 2019- Progression [CT chest; [facility]]; Brain MRI- new sub cm lesions- monitored  # feb 14th 2019- Tax-Cyram   Interval history-patient presents to symptom management clinic today for  concerns of mouth sores.  Deanna Blair is a 62 y.o. female who complains of oral tenderness and esophageal pain with swallowing.  Associated signs and symptoms include: sore throat and pain while swallowing, anorexia, dizziness, fatigue, nausea, weakness, altered taste, and weight loss She has had these symptoms for several weeks but they have worsened in severity and she now describes them as severe. Her symptoms occur constantly and have worsened since her last chemotherapy treatment.  She states that she has used 2 bottles of Magic mouthwash without improvement in symptoms believe she is dehydrated d/t dry nasal passages and spots of blood from nose, malodorous/dark/concentrated urine.  She is continuing to her upper and lower dentures.  She has been using baking soda and salt water rinses without improvement in symptoms.  States she is not able to tolerate solid foods.  No sores on her lips.  No history of fever blisters or cold sores.  Currently taking oxycodone 10 mg for pain, which she states does not help.    Has noticed all over "bone pain" for the last 3-4 days since chemotherapy.  She has been taking Claritin, but not Tylenol.  Received with chemo.Udenyca rates her pain 4 of 10, describes as 'aching'.  Unaware of anything that makes pain better or worse.  States that bone pain is bothersome but does not impact her ADLs.  States that pain in her mouth is significantly debilitating and she is questioning continuing with chemotherapy.  Patient also complaining of rhinorrhea which began approximately 3 days ago.  Symptoms are unchanged since that time.  She describes drainage as thin and watery.  Mild sinus congestion.  No fever or chills, no  cough, no shortness of breath or wheezing.  Has not taken anything for symptoms.  States she feels better today believes symptoms may be improving.  ECOG FS:1 - Symptomatic but completely ambulatory  Review of systems- Review of Systems    Constitutional: Negative for chills, diaphoresis, fever, malaise/fatigue and weight loss.  HENT: Positive for congestion, nosebleeds and sore throat. Negative for ear pain, hearing loss, sinus pain and tinnitus.        Rhinorrhea  Eyes: Negative for pain, discharge and redness.  Respiratory: Negative.  Negative for stridor.   Cardiovascular: Negative.   Gastrointestinal: Negative.   Genitourinary:       Dark/concentrated urine  Musculoskeletal: Positive for myalgias.  Skin: Positive for rash (red, sore rash on gluteal folds).  Neurological: Positive for tingling and sensory change (finger numbness). Negative for tremors and weakness.  Endo/Heme/Allergies: Negative.   Psychiatric/Behavioral:       Irritated    Current treatment- s/p cycle 2 Docetaxel + Ramucirumab (03/09/17) with Udenyca support (03/10/17)  No Known Allergies  Past Medical History:  Diagnosis Date  . Anxiety   . Cancer of right lung ([place]) 02/13/2015, 01/13/17  . Depression   . Depression with anxiety    Well controlled with Wellbutrin and Buspar  . Pneumonia    Past Surgical History:  Procedure Laterality Date  . ECTOPIC PREGNANCY SURGERY  1988  . ELECTROMAGNETIC NAVIGATION BROCHOSCOPY N/A 02/10/2015   Procedure: ELECTROMAGNETIC NAVIGATION BRONCHOSCOPY;  Surgeon: Flora Lipps, MD;  Location: [facility] ORS;  Service: Cardiopulmonary;  Laterality: N/A;  . ENDOBRONCHIAL ULTRASOUND N/A 02/10/2015   Procedure: ENDOBRONCHIAL ULTRASOUND;  Surgeon: Flora Lipps, MD;  Location: [facility] ORS;  Service: Cardiopulmonary;  Laterality: N/A;  . PERIPHERAL VASCULAR CATHETERIZATION N/A 03/05/2015   Procedure: Glori Luis Cath Insertion;  Surgeon: Katha Cabal, MD;  Location: [facility];  Service: Cardiovascular;  Laterality: N/A;   Social History   Socioeconomic History  . Marital status: Married    Spouse name: Herbie Baltimore  . Number of children: 1  . Years of education: 30  . Highest education level: Not on file  Social Needs  .  Financial resource strain: Not on file  . Food insecurity - worry: Not on file  . Food insecurity - inability: Not on file  . Transportation needs - medical: Not on file  . Transportation needs - non-medical: Not on file  Occupational History  . 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 Smoking status: Former Smoker    Packs/day: 0.25    Years: 30.00    Pack years: 7.50    Types: Cigarettes    Last attempt to quit: 02/06/2005    Years since quitting: 12.1  . Smokeless tobacco: Never Used  Substance and Sexual Activity  . Alcohol use: Yes    Alcohol/week: 3.0 oz    Types: 5 Cans of beer per week    Comment: 5 beers weekly  . Drug use: No  . Sexual activity: Yes    Partners: Male  Other Topics Concern  . Not on file  Social History Narrative   Deanna Blair was born in [place], [place]. She moved with her family with [facility] and lived there for 13 years. She recently moved to [place] approximately 1 year ago. She lives with her husband of 28 years. They have an adult daughter who lives in [place]. 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 She is enjoying  driving around and getting to know [place]. She and her husband enjoy cooking and baking. They also enjoy hiking when the weather permits.   Family History  Problem Relation Age of Onset  . Cancer Father        Prostate Cancer  . Hypertension Father   . Stroke Father   . Hypertension Brother     Current Outpatient Medications:  .  acetaminophen (TYLENOL) 500 MG tablet, Take 1,000 mg by mouth every 4 (four) hours as needed. , Disp: , Rfl:  .  docusate sodium (COLACE) 100 MG capsule, Take 100 mg by mouth 2 (two) times daily., Disp: , Rfl:  .  Fluticasone-Umeclidin-Vilant (TRELEGY ELLIPTA) 100-62.5-25 MCG/INH AEPB, Inhale 1 puff into the lungs daily., Disp: 1 each, Rfl: 4 .  gabapentin (NEURONTIN) 100 MG capsule, Take 2 capsules (200 mg total) by mouth 3 (three)  times daily., Disp: 180 capsule, Rfl: 6 .  ibuprofen (IBU) 400 MG tablet, Take 1 tablet (400 mg total) by mouth 3 (three) times daily as needed., Disp: 30 tablet, Rfl: 0 .  lidocaine-prilocaine (EMLA) cream, Apply 1 application topically as needed., Disp: 30 g, Rfl: 6 .  magic mouthwash w/lidocaine SOLN, Take 5 mLs by mouth 4 (four) times daily., Disp: 480 mL, Rfl: 3 .  ondansetron (ZOFRAN) 8 MG tablet, Take 1 tablet (8 mg total) by mouth every 8 (eight) hours as needed for nausea or vomiting (start 3 days; after chemo)., Disp: 40 tablet, Rfl: 1 .  Oxycodone HCl 10 MG TABS, Take 1 tablet (10 mg total) by mouth every 6 (six) hours as needed (moderate to severe pain)., Disp: 60 tablet, Rfl: 0 .  oxyCODONE-acetaminophen (PERCOCET) 10-325 MG tablet, Take 1 tablet by mouth every 4 (four) hours as needed for pain., Disp: 40 tablet, Rfl: 0 .  prochlorperazine (COMPAZINE) 10 MG tablet, Take 1 tablet (10 mg total) by mouth every 6 (six) hours as needed for nausea or vomiting., Disp: 25 tablet, Rfl: 1 .  zolpidem (AMBIEN) 5 MG tablet, TAKE 1 TABLET BY MOUTH AT BEDTIME AS NEEDED FOR SLEEP, Disp: 30 tablet, Rfl: 2 .  dexamethasone (DECADRON) 4 MG tablet, Take 1 tablet (4 mg total) by mouth 2 (two) times daily with a meal. Start the day prior to chemo; for 3 days. (Patient not taking: Reported on 02/28/2017), Disp: 60 tablet, Rfl: 3  Physical exam:  Vitals:   03/16/17 1043  BP: 140/78  Pulse: (!) 105  Resp: 20  Temp: 97.9 F (36.6 C)  TempSrc: Tympanic  Weight: 119 lb (54 kg)   Physical Exam  Constitutional: She is oriented to person, place, and time and well-developed, well-nourished, and in no distress. No distress.  HENT:  Head: Normocephalic and atraumatic.  Right Ear: Tympanic membrane and external ear normal. Tympanic membrane is not injected and not erythematous.  Left Ear: Tympanic membrane and external ear normal. Tympanic membrane is not injected, not erythematous and not bulging.  Nose:  Rhinorrhea present. Right sinus exhibits no maxillary sinus tenderness and no frontal sinus tenderness. Left sinus exhibits no maxillary sinus tenderness and no frontal sinus tenderness.  Turbinates inflamed, Mouth sores & white patches upper and lower. Dentures in place.   Eyes: Conjunctivae are normal.  Neck: Normal range of motion. Neck supple.  Cardiovascular: Normal rate, regular rhythm and normal heart sounds.  Pulmonary/Chest: Effort normal and breath sounds normal. No respiratory distress. She has no wheezes.  Abdominal: Soft. Bowel sounds are normal. There is no tenderness.  Musculoskeletal:  She exhibits no edema or deformity.  Lymphadenopathy:    She has no cervical adenopathy.  Neurological: She is alert and oriented to person, place, and time.  Skin: Skin is warm and dry. No rash noted.  Pt declines assessment of rash  Psychiatric: Memory and judgment normal. Her affect is blunt. She is agitated.  Vitals reviewed.    CMP Latest Ref Rng & Units 03/16/2017  Glucose 65 - 99 mg/dL 110(H)  BUN 6 - 20 mg/dL 37(H)  Creatinine 0.44 - 1.00 mg/dL 0.70  Sodium 135 - 145 mmol/L 137  Potassium 3.5 - 5.1 mmol/L 4.1  Chloride 101 - 111 mmol/L 102  CO2 22 - 32 mmol/L 21(L)  Calcium 8.9 - 10.3 mg/dL 8.8(L)  Total Protein 6.5 - 8.1 g/dL 6.7  Total Bilirubin 0.3 - 1.2 mg/dL 0.7  Alkaline Phos 38 - 126 U/L 84  AST 15 - 41 U/L 30  ALT 14 - 54 U/L 22   CBC Latest Ref Rng & Units 03/16/2017  WBC 3.6 - 11.0 K/uL 10.2  Hemoglobin 12.0 - 16.0 g/dL 12.9  Hematocrit 35.0 - 47.0 % 37.0  Platelets 150 - 440 K/uL 111(L)    Assessment and plan- Patient is a 62 y.o. female who presents to Symptom Management Clinic for mucositis.   1. Lung Cancer- adenocarcinoma of the right lung (T4, N2, M1). Previously on carbo-taxol-avastin. 01/12/17 ct chest [facility] clinic) showed progress of lung disease vs infection; MRI brain- improved right frontal lesions with new 76mm lesion noted in right parietal lobe-  monitor. Initiated cycle 1 Ram-Tax on 02/17/17 as molecular testing at [facility] was unrevealing for targetable mutations. Currently s/p cycle 2 of Ram-Tax chemo with Udenyca support.   2. Chemotherapy induced mucositis- symptoms significantly painful and interfering with nutrition and comfort. Discussed reducing use of dentures in interim but patient reluctant to do so. Feel this may exacerbate symptoms. Unable to tolerate pills for pain. Will switch to fentanyl patch 25 mcg for pain control. Start Diflucan oral suspension 200 mg daily x 21 days for mucositis. Encouraged good oral hygiene including soft toothbrush. Can consider peroxyl (otc) rinses. Continue baking soda and/or salt water rinses. Discussed that symptoms likely exacerbated by dehydration. If symptoms persist, could consider oral rinses including combinations of doxepin (for pain), dexamethasone (anti-inflammatory), tetracycline (anti-biotic), and/or lidocaine (anesthetic).  3. Dehydration- suspect related to mouth sores and malaise from chemo. IV fluids in clinic today with improvement in symptoms. May require additional fluids w/ additional chemo treatments.   4. Malnutrition- 9 lb weight loss - discussed nutritional shakes. Encouraged to drink 2-3 per day and continue intake of soft, calorie and protein rich foods. Will refer to Jennet Maduro, dietitian for evaluation.   5. Chemotherapy induced myalgias and joint pain- grade 1-2. Likely r/t Taxotere and/or Udenyca. Discussed role of Tylenol (not to exceed 3g in 24 hours) and Claritin (daily) in bone pain.   6. Skin Rash- unclear etiology- chemo? Moisture? In interim, keep area clean. Desitin topically. Diflucan may improve symptoms. Could also consider or oral antibiotics, clindamycin lotion, and/or oral steroids if symptoms persist.    RTC for labs and evaluation with Dr. Rogue Bussing on 03/30/17. If symptoms persist, happy to see patient back sooner. Patient advised to notify the clinic if  there is no improvement in symptoms or if symptoms worsen in next 3-4 days. Patient expressed understanding and was in agreement with this plan. She also understands that She can call clinic at any time with any questions,  concerns, or complaints.     Visit Diagnosis 1. Primary cancer of right lower lobe of lung ([place])   2. Mucositis due to chemotherapy   3. Dehydration   4. Severe protein-calorie malnutrition ([place])   5. Joint pain following chemotherapy   6. Rash    A total of (30) minutes of face-to-face time was spent with this patient with greater than 50% of that time in counseling and care-coordination.  Beckey Rutter, DNP, AGNP-C [place] at [facility] [phone] (office) 03/22/17 4:09 PM

## 2017-03-16 NOTE — Telephone Encounter (Signed)
Husband called to report that her thrush is worse, she has been through 2 bottles of mouthwash, and has stopped her in haler. She is having difficulty swallowing pills. I gave her an appointment for 1030 this morning

## 2017-03-21 ENCOUNTER — Telehealth: Payer: Self-pay

## 2017-03-21 NOTE — Telephone Encounter (Signed)
Nutrition Assessment   Reason for Assessment:   Verbal referral from Beckey Rutter, NP for weight loss, mouth sores  ASSESSMENT:  62 year old female with recurrent lung cancer.  Patient currently receiving chemotherapy.  Past medical history of depression, HLD, cervical cancer.    Called patient via phone and introduce myself and service.  Patient reports mouth sores are slowly improving.  Has been eating eggs, chicken noodle soups, pancakes, peanut butter and jelly sandwiches and drinking oral nutrition shakes (maybe 1 per day).  Patient reports food has no taste.    Nutrition Focused Physical Exam: deferred  Medications: diflucan, magic mouthwash,  Zofran, compazine, colace, oxycodone  Labs: reviewed  Anthropometrics:   Height: 63 inches Weight: 119 lb on 3/13 UBW: 116 lb per patient as she gained weight before starting treatment.  Noted weight has been stable at 127-130 lb BMI: 21  7% weight loss in 1 week, significant   Estimated Energy Needs  Kcals: 1350-1620 calories/d Protein: 65-81 g/d Fluid: 1.6 L/d  NUTRITION DIAGNOSIS: Inadequate oral intake related to cancer and cancer related side effects (mouth sores, taste change) as evidenced by 7% weight loss in 1 week.    MALNUTRITION DIAGNOSIS:  Unable to determine with phone visit  INTERVENTION:  Discussed ways to add calories and protein and provided foods options for patient.  Patient agreeable to receiving handouts via mail  Not sure if she will have next chemotherapy treatment as planned and did not want to schedule appointment with RD at this time.  Will mail contact information and patient can contact me with questions or concerns.    MONITORING, EVALUATION, GOAL: weight trends, intake   NEXT VISIT: as needed, patient to contact   Faryal Marxen B. Zenia Resides, [place], [place] Registered Dietitian [phone] (pager)

## 2017-03-22 ENCOUNTER — Encounter: Payer: Self-pay | Admitting: Nurse Practitioner

## 2017-03-30 ENCOUNTER — Other Ambulatory Visit: Payer: Self-pay

## 2017-03-30 ENCOUNTER — Inpatient Hospital Stay (HOSPITAL_BASED_OUTPATIENT_CLINIC_OR_DEPARTMENT_OTHER): Payer: BLUE CROSS/BLUE SHIELD | Admitting: Internal Medicine

## 2017-03-30 ENCOUNTER — Encounter: Payer: Self-pay | Admitting: Internal Medicine

## 2017-03-30 ENCOUNTER — Inpatient Hospital Stay: Payer: BLUE CROSS/BLUE SHIELD

## 2017-03-30 VITALS — BP 112/77 | HR 97 | Temp 97.9°F | Resp 20 | Ht 63.0 in | Wt 119.4 lb

## 2017-03-30 DIAGNOSIS — C3431 Malignant neoplasm of lower lobe, right bronchus or lung: Secondary | ICD-10-CM

## 2017-03-30 DIAGNOSIS — K1231 Oral mucositis (ulcerative) due to antineoplastic therapy: Secondary | ICD-10-CM

## 2017-03-30 DIAGNOSIS — R21 Rash and other nonspecific skin eruption: Secondary | ICD-10-CM | POA: Diagnosis not present

## 2017-03-30 DIAGNOSIS — C7800 Secondary malignant neoplasm of unspecified lung: Secondary | ICD-10-CM | POA: Diagnosis not present

## 2017-03-30 DIAGNOSIS — M791 Myalgia, unspecified site: Secondary | ICD-10-CM

## 2017-03-30 DIAGNOSIS — L739 Follicular disorder, unspecified: Secondary | ICD-10-CM

## 2017-03-30 DIAGNOSIS — C7931 Secondary malignant neoplasm of brain: Secondary | ICD-10-CM | POA: Diagnosis not present

## 2017-03-30 DIAGNOSIS — Z95828 Presence of other vascular implants and grafts: Secondary | ICD-10-CM

## 2017-03-30 LAB — URINALYSIS, COMPLETE (UACMP) WITH MICROSCOPIC
GLUCOSE, UA: NEGATIVE mg/dL
Nitrite: NEGATIVE
PROTEIN: 30 mg/dL — AB
Specific Gravity, Urine: 1.015 (ref 1.005–1.030)
pH: 6.5 (ref 5.0–8.0)

## 2017-03-30 LAB — COMPREHENSIVE METABOLIC PANEL
ALT: 18 U/L (ref 14–54)
ANION GAP: 13 (ref 5–15)
AST: 36 U/L (ref 15–41)
Albumin: 3.6 g/dL (ref 3.5–5.0)
Alkaline Phosphatase: 94 U/L (ref 38–126)
BILIRUBIN TOTAL: 0.5 mg/dL (ref 0.3–1.2)
BUN: 22 mg/dL — ABNORMAL HIGH (ref 6–20)
CO2: 22 mmol/L (ref 22–32)
Calcium: 9.5 mg/dL (ref 8.9–10.3)
Chloride: 105 mmol/L (ref 101–111)
Creatinine, Ser: 0.88 mg/dL (ref 0.44–1.00)
Glucose, Bld: 183 mg/dL — ABNORMAL HIGH (ref 65–99)
POTASSIUM: 3.4 mmol/L — AB (ref 3.5–5.1)
Sodium: 140 mmol/L (ref 135–145)
TOTAL PROTEIN: 7 g/dL (ref 6.5–8.1)

## 2017-03-30 LAB — CBC WITH DIFFERENTIAL/PLATELET
BASOS ABS: 0.1 10*3/uL (ref 0–0.1)
Basophils Relative: 1 %
EOS PCT: 0 %
Eosinophils Absolute: 0 10*3/uL (ref 0–0.7)
HEMATOCRIT: 34.1 % — AB (ref 35.0–47.0)
HEMOGLOBIN: 11.7 g/dL — AB (ref 12.0–16.0)
LYMPHS PCT: 14 %
Lymphs Abs: 2.8 10*3/uL (ref 1.0–3.6)
MCH: 37.3 pg — ABNORMAL HIGH (ref 26.0–34.0)
MCHC: 34.3 g/dL (ref 32.0–36.0)
MCV: 108.7 fL — AB (ref 80.0–100.0)
Monocytes Absolute: 1.3 10*3/uL — ABNORMAL HIGH (ref 0.2–0.9)
Monocytes Relative: 6 %
NEUTROS ABS: 16.3 10*3/uL — AB (ref 1.4–6.5)
NEUTROS PCT: 79 %
PLATELETS: 251 10*3/uL (ref 150–440)
RBC: 3.14 MIL/uL — AB (ref 3.80–5.20)
RDW: 15.5 % — ABNORMAL HIGH (ref 11.5–14.5)
WBC: 20.5 10*3/uL — AB (ref 3.6–11.0)

## 2017-03-30 MED ORDER — FENTANYL 25 MCG/HR TD PT72: 25.0000 ug | MEDICATED_PATCH | TRANSDERMAL | 0 refills | Status: DC

## 2017-03-30 MED ORDER — CEPHALEXIN 500 MG PO CAPS: 500.0000 mg | ORAL_CAPSULE | Freq: Three times a day (TID) | ORAL | 0 refills | Status: DC

## 2017-03-30 MED ORDER — HEPARIN SOD (PORK) LOCK FLUSH 100 UNIT/ML IV SOLN
500.0000 [IU] | Freq: Once | INTRAVENOUS | Status: AC
  Administered 2017-03-30: 500 [IU] via INTRAVENOUS

## 2017-03-30 MED ORDER — HEPARIN SOD (PORK) LOCK FLUSH 100 UNIT/ML IV SOLN
INTRAVENOUS | Status: AC
  Filled 2017-03-30: qty 5

## 2017-03-30 NOTE — Progress Notes (Signed)
patient reports so reports worsening fatigue with ADLs. She also reports that one of her Duragesic patches fell off. She will need a script for the fentanyl patches sooner.

## 2017-03-30 NOTE — Assessment & Plan Note (Addendum)
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 JAN 9th CT Chest [facility] clinic] Progression  Of lung disease vs ? Infection  [less likley]; MRI Brain-improved right frontal lesion; new 2 mm lesion noted in right paietal lobe.   # Currently on Ram-Tax s/p cycle #2- poor tolerance to chemo [see discussion below].  # HOLD Ram-Tax cycle # 3 today; will get CT scan in appx 2-3 weeks [[facility]].  Today white count is 20,000 no signs of infection.  Suspect from Neulasta.  # oral mucositis-continue Magic mouthwash.  Improved.  # skin rash buttocks/folliculitis- on keflex TID.   # Elevated Blood pressure- 160s at home/improved.   # Hx of brain metastases- s/p GK in [facility]; but new 2 mm lesion noted; No GK. Awaiting- re-eval at [facility] in the next 2-3 weeks.  # PN-G-1.-2  Sec to taxol; neurotin TID-improving.  # Myalgias- disc nuelasta/udencyca.   # follow up in 3 weeks; Tax-Ram. Pt will let know about CT scan/ appt in [facility] brain April 15th 2019. referal to [facility]-patient will let me know her preference.  Addendum: Patient wants to have a CT scan of the chest done here; will arrange prior to the [facility] visit.

## 2017-03-30 NOTE — Progress Notes (Signed)
[place] OFFICE PROGRESS NOTE  Patient Care Team: Mar Daring, PA-C as PCP - General (Family Medicine) Allyne Gee, MD as Referring Physician (Internal Medicine)  Malignant neoplasm of trachea, bronchus, and lung [facility])   Staging form: Lung, AJCC 7th Edition     Clinical: Stage IIIB (T4, N2, M0) - Signed by Cammie Sickle, MD on 06/24/2015    Oncology History   # FEB 2017-ADENO CA Right Lower Lung T4N2M0- STAGE IIIB [Bronch & Subcarinal LNBx]- March 8th START CARBO-ALIMTA x4 cycles- June 8th 2017 - IMPROVED FDG MULTI-FOCAL lesions/N2-LN activity. S/p [place]-- Alimta x6  # AUG 18th- Al[place]- Avastin maintenance; SEP 13th DISCONTINUE AVASTIN [sec to cavitary lesion]; OCT 20th 2017- CT-STABLE Disease; NOV 8th CT/PET [facility]]- "overall slight progression-? Lymphangiectatic spread"  # Jan - April 2018Clement Husbands; progression  # April 25th 2018- Carbo- Taxol x3 ccyles; June 2018- CT chest STABLE Disease;July 25th- Add Avastin to [place] taxol x6 cycles- Sep 2018-CT scan- [facility] STABLE   # Sep 2018- Avastin Maintence;   JAN 9th CT- right lung progression [s/p Bx- [place]; [place] [facility]; F-one- 1/15]  # NOV 2nd 2017-MRI, [facility]- Brain mets [asymptomatic;Right frontal ~25m; ~342mlesions -appx 3-4 lesions; (right Frontal & Left parietal s/p GK x 2 lesions] ; June 2018- Progession vs radiation necrosis [July 2th-Add Avastin]  # [facility] 4 small subcentimeter brain lesions for which she had the Gamma knife. The main right frontal lesion gotten smaller; improve edema.  # Jan 2019- Progression [CT chest; [facility]]; Brain MRI- new sub cm lesions- monitored  # feb 14th 2019- Tax-Cyram   --------------------------------------------------------------     MOLECULAR STUDIES:  KRAS MUTATED/Tissues not sufficient for OTHER the molecular marker; will need repeat Bx ; GUARDIANT TESTING [[facility]]- pending. [phone]]  [facility] No targettable mutations.      [place]    INTERVAL HISTORY: Deanna Rozman89.o.  female pleasant patient above history of Recurrent Metastatic adenocarcinoma the lung; And brain metastases- Currently on Taxotere-Cyramza status post cycle #2 is here for follow-up.  Patient had multiple problems from the chemotherapy again-oral mucositis/currently swallowing pain with swallowing.  She also had intermittent nosebleeds especially in blowing.  Intermittent headaches.   She also complained of diffuse body aches; question related to Neulasta [patient had been on oxycodone/ibuprofen].   Patient denies any worsening shortness of breath or cough.  Denies any chest pain.  No back pain.   Complains of significant fatigue; she wants to take a break from chemotherapy.  Also complains of worsening tingling and numbness in the lower extremities.  REVIEW OF SYSTEMS:  A complete 10 point review of system is done which is negative except mentioned above/history of present illness.   PAST MEDICAL HISTORY :  Past Medical History:  Diagnosis Date  . Anxiety   . Cancer of right lung ([facility]/2015, 01/13/17  . Depression   . Depression with anxiety    Well controlled with Wellbutrin and Buspar  . Pneumonia     PAST SURGICAL HISTORY :   Past Surgical History:  Procedure Laterality Date  . ECTOPIC PREGNANCY SURGERY  1988  . ELECTROMAGNETIC NAVIGATION BROCHOSCOPY N/A 02/10/2015   Procedure: ELECTROMAGNETIC NAVIGATION BRONCHOSCOPY;  Surgeon: KuFlora LippsMD;  Location: [facility] ORS;  Service: Cardiopulmonary;  Laterality: N/A;  . ENDOBRONCHIAL ULTRASOUND N/A 02/10/2015   Procedure: ENDOBRONCHIAL  ULTRASOUND;  Surgeon: Flora Lipps, MD;  Location: [facility] ORS;  Service: Cardiopulmonary;  Laterality: N/A;  . PERIPHERAL VASCULAR CATHETERIZATION N/A 03/05/2015   Procedure: Glori Luis Cath Insertion;  Surgeon: Katha Cabal, MD;   Location: [place] CV LAB;  Service: Cardiovascular;  Laterality: N/A;    FAMILY HISTORY :   Family History  Problem Relation Age of Onset  . Cancer Father        Prostate Cancer  . Hypertension Father   . Stroke Father   . Hypertension Brother     SOCIAL HISTORY:   Social History   Tobacco Use  . Smoking status: Former Smoker    Packs/day: 0.25    Years: 30.00    Pack years: 7.50    Types: Cigarettes    Last attempt to quit: 02/06/2005    Years since quitting: 12.1  . Smokeless tobacco: Never Used  Substance Use Topics  . Alcohol use: Yes    Alcohol/week: 3.0 oz    Types: 5 Cans of beer per week    Comment: 5 beers weekly  . Drug use: No    ALLERGIES:  has No Known Allergies.  MEDICATIONS:  Current Outpatient Medications  Medication Sig Dispense Refill  . dexamethasone (DECADRON) 4 MG tablet Take 1 tablet (4 mg total) by mouth 2 (two) times daily with a meal. Start the day prior to chemo; for 3 days. 60 tablet 3  . docusate sodium (COLACE) 100 MG capsule Take 100 mg by mouth 2 (two) times daily.    . fentaNYL (DURAGESIC - DOSED MCG/HR) 25 MCG/HR patch Place 1 patch (25 mcg total) onto the skin every 3 (three) days. 10 patch 0  . Fluticasone-Umeclidin-Vilant (TRELEGY ELLIPTA) 100-62.5-25 MCG/INH AEPB Inhale 1 puff into the lungs daily. 1 each 4  . lidocaine-prilocaine (EMLA) cream Apply 1 application topically as needed. 30 g 6  . magic mouthwash w/lidocaine SOLN Take 5 mLs by mouth 4 (four) times daily. 480 mL 3  . Oxycodone HCl 10 MG TABS Take 1 tablet (10 mg total) by mouth every 6 (six) hours as needed (moderate to severe pain). 60 tablet 0  . zolpidem (AMBIEN) 5 MG tablet TAKE 1 TABLET BY MOUTH AT BEDTIME AS NEEDED FOR SLEEP 30 tablet 2  . acetaminophen (TYLENOL) 500 MG tablet Take 1,000 mg by mouth every 4 (four) hours as needed.     . cephALEXin (KEFLEX) 500 MG capsule Take 1 capsule (500 mg total) by mouth 3 (three) times daily. 21 capsule 0  . fluconazole  (DIFLUCAN) 40 MG/ML suspension Take 5 mLs (200 mg total) by mouth daily for 21 days. (Patient not taking: Reported on 03/30/2017) 105 mL 1  . ibuprofen (IBU) 400 MG tablet Take 1 tablet (400 mg total) by mouth 3 (three) times daily as needed. (Patient not taking: Reported on 03/30/2017) 30 tablet 0  . ondansetron (ZOFRAN) 8 MG tablet Take 1 tablet (8 mg total) by mouth every 8 (eight) hours as needed for nausea or vomiting (start 3 days; after chemo). (Patient not taking: Reported on 03/30/2017) 40 tablet 1  . prochlorperazine (COMPAZINE) 10 MG tablet Take 1 tablet (10 mg total) by mouth every 6 (six) hours as needed for nausea or vomiting. (Patient not taking: Reported on 03/30/2017) 25 tablet 1   No current facility-administered medications for this visit.     PHYSICAL EXAMINATION: ECOG PERFORMANCE STATUS: 0 - Asymptomatic  BP 112/77 (Patient Position: Sitting)   Pulse 97   Temp 97.9 F (36.6  C) (Tympanic)   Resp 20   Ht 5' 3"  (1.6 m)   Wt 119 lb 6.4 oz (54.2 kg)   LMP 06/16/2000 (Approximate) Comment: 15 yrs ago  BMI 21.15 kg/m   Filed Weights   03/30/17 0908  Weight: 119 lb 6.4 oz (54.2 kg)    GENERAL: Well-nourished well-developed; Alert, no distress and comfortable. She is accompanied by her husband.  Patient appears fatigued. EYES: no pallor or icterus OROPHARYNX: no thrush or ulceration; no thrush noted. NECK: supple, no masses felt LYMPH:  no palpable lymphadenopathy in the cervical, axillary or inguinal regions LUNGS: clear to auscultation and  No wheeze or crackles HEART/CVS: regular rate & rhythm and no murmurs; No lower extremity edema;No erythema. ABDOMEN:abdomen soft, non-tender and normal bowel sounds Musculoskeletal:no cyanosis of digits and no clubbing  PSYCH: alert & oriented x 3 with fluent speech.  NEURO: no focal motor/sensory deficits SKIN: Folliculitis like lesions noted in the perineum; scabbing not complete resolved.  LABORATORY DATA:  I have reviewed  the data as listed    Component Value Date/Time   NA 140 03/30/2017 0901   NA 139 01/03/2015 0910   K 3.4 (L) 03/30/2017 0901   CL 105 03/30/2017 0901   CO2 22 03/30/2017 0901   GLUCOSE 183 (H) 03/30/2017 0901   BUN 22 (H) 03/30/2017 0901   BUN 10 01/03/2015 0910   CREATININE 0.88 03/30/2017 0901   CALCIUM 9.5 03/30/2017 0901   PROT 7.0 03/30/2017 0901   PROT 6.5 01/03/2015 0910   ALBUMIN 3.6 03/30/2017 0901   ALBUMIN 3.8 01/03/2015 0910   AST 36 03/30/2017 0901   ALT 18 03/30/2017 0901   ALKPHOS 94 03/30/2017 0901   BILITOT 0.5 03/30/2017 0901   BILITOT <0.2 01/03/2015 0910   GFRNONAA >60 03/30/2017 0901   GFRAA >60 03/30/2017 0901    No results found for: SPEP, UPEP  Lab Results  Component Value Date   WBC 20.5 (H) 03/30/2017   NEUTROABS 16.3 (H) 03/30/2017   HGB 11.7 (L) 03/30/2017   HCT 34.1 (L) 03/30/2017   MCV 108.7 (H) 03/30/2017   PLT 251 03/30/2017      Chemistry      Component Value Date/Time   NA 140 03/30/2017 0901   NA 139 01/03/2015 0910   K 3.4 (L) 03/30/2017 0901   CL 105 03/30/2017 0901   CO2 22 03/30/2017 0901   BUN 22 (H) 03/30/2017 0901   BUN 10 01/03/2015 0910   CREATININE 0.88 03/30/2017 0901      Component Value Date/Time   CALCIUM 9.5 03/30/2017 0901   ALKPHOS 94 03/30/2017 0901   AST 36 03/30/2017 0901   ALT 18 03/30/2017 0901   BILITOT 0.5 03/30/2017 0901   BILITOT <0.2 01/03/2015 0910     09/14/2016 [compared to June 2018]  FINDINGS: There is a semisolid mass with underlying cystic changes and cavitation in the superior segment of the right lower lobe measuring slightly greater than 6 cm in size. This has not changed significantly since the prior outside examination. Peripheral groundglass and airspace infiltrates in the right lower lobe inferiorly are stable as are the groundglass infiltrates in the inferior right upper lobe, some of which contain cystic changes. Mild interlobular septal thickening in the right upper lobe  persists.  2 mm lingular nodule (2/157) is unchanged. Right perihilar and lower lobe bronchial wall thickening is again noted. Right IJ Port-A-Cath tip in the upper RA. Old right rib fracture. Mild emphysematous changes in the upper  lungs. No thoracic adenopathy by size criteria. ---------------------------------------------------------- 09/14/2016- IMPRESSION:  No CT evidence of metastatic disease in the abdomen and pelvis.   RADIOGRAPHIC STUDIES: I have personally reviewed the radiological images as listed and agreed with the findings in the report. No results found.   ASSESSMENT & PLAN:  Primary cancer of right lower lobe of lung ([address]) T4 N2 M1-adenocarcinoma of the lung right side-on cabo-Taxol- Avastin. JAN 9th CT Chest [facility] clinic] Progression  Of lung disease vs ? Infection  [less likley]; MRI Brain-improved right frontal lesion; new 2 mm lesion noted in right paietal lobe.   # Currently on Ram-Tax s/p cycle #2- poor tolerance to chemo [see discussion below].  # HOLD Ram-Tax cycle # 3 today; will get CT scan in appx 2-3 weeks [[facility]].  Today white count is 20,000 no signs of infection.  Suspect from Neulasta.  # oral mucositis-continue Magic mouthwash.  Improved.  # skin rash buttocks/folliculitis- on keflex TID.   # Elevated Blood pressure- 160s at home/improved.   # Hx of brain metastases- s/p GK in [facility]; but new 2 mm lesion noted; No GK. Awaiting- re-eval at [facility] in the next 2-3 weeks.  # PN-G-1.-2  Sec to taxol; neurotin TID-improving.  # Myalgias- disc nuelasta/udencyca.   # follow up in 3 weeks; Tax-Ram. Pt will let know about CT scan/ appt in [facility] brain April 15th 2019. referal to [facility]-patient will let me know her preference.  Addendum: Patient wants to have a CT scan of the chest done here; will arrange prior to the [facility] visit.   Orders Placed This Encounter  Procedures  . CBC with Differential/Platelet    Standing Status:   Future    Standing  Expiration Date:   03/31/2018  . Comprehensive metabolic panel    Standing Status:   Future    Standing Expiration Date:   03/31/2018     Cammie Sickle, MD 03/31/2017 5:31 PM

## 2017-03-31 ENCOUNTER — Inpatient Hospital Stay: Payer: BLUE CROSS/BLUE SHIELD

## 2017-03-31 ENCOUNTER — Telehealth: Payer: Self-pay | Admitting: *Deleted

## 2017-03-31 ENCOUNTER — Other Ambulatory Visit: Payer: Self-pay | Admitting: Internal Medicine

## 2017-03-31 DIAGNOSIS — C3431 Malignant neoplasm of lower lobe, right bronchus or lung: Secondary | ICD-10-CM

## 2017-03-31 NOTE — Telephone Encounter (Signed)
Patient left vm that she has decided to have her ct scans at [facility].  I spoke with Dr. Rogue Bussing. He will enter ct chest scan order. Colette, please schedule ct scan.

## 2017-03-31 NOTE — Progress Notes (Signed)
Collete- Please schedule the CT scan in approximately 1 week [prior to her visit to [place].  Heather-please confirm with patient if she wants to have the MRI done at [facility] as she is going there.  My preference that she should have the MRI done there; so that they can compare/treat her if needed.  Hayley- please have the CT scans/MRI brain [jan 2019] done in [facility] clinic uploaded to PACS so that we can compare the scans which would done here/ next week. I have the CD in my office.   Thx.

## 2017-04-04 NOTE — Progress Notes (Signed)
CD of images has been given to radiology to upload images to system.

## 2017-04-04 NOTE — Progress Notes (Signed)
Per Colette, patient stated that she was going to get the MRI at the [facility] clinic.

## 2017-04-05 ENCOUNTER — Ambulatory Visit
Admission: RE | Admit: 2017-04-05 | Discharge: 2017-04-05 | Disposition: A | Discharge status: Home / Self Care | Payer: Self-pay | Source: Ambulatory Visit | Attending: Internal Medicine | Admitting: Internal Medicine

## 2017-04-05 ENCOUNTER — Other Ambulatory Visit: Payer: Self-pay | Admitting: Internal Medicine

## 2017-04-05 ENCOUNTER — Other Ambulatory Visit: Payer: Self-pay | Admitting: Nurse Practitioner

## 2017-04-05 DIAGNOSIS — C3431 Malignant neoplasm of lower lobe, right bronchus or lung: Secondary | ICD-10-CM

## 2017-04-05 DIAGNOSIS — G893 Neoplasm related pain (acute) (chronic): Secondary | ICD-10-CM

## 2017-04-05 NOTE — Telephone Encounter (Signed)
Deanna Blair, I sent this to you because this is an [place] patient. Thanks, [name]

## 2017-04-06 ENCOUNTER — Other Ambulatory Visit: Payer: Self-pay | Admitting: Nurse Practitioner

## 2017-04-06 DIAGNOSIS — C3431 Malignant neoplasm of lower lobe, right bronchus or lung: Secondary | ICD-10-CM

## 2017-04-06 DIAGNOSIS — G893 Neoplasm related pain (acute) (chronic): Secondary | ICD-10-CM

## 2017-04-06 NOTE — Telephone Encounter (Signed)
Deanna Blair, this patient is seen at [facility]. I tried sending this to [facility] yesterday. I'm not sure she got it. Can you send, please.

## 2017-04-07 ENCOUNTER — Telehealth: Payer: Self-pay | Admitting: *Deleted

## 2017-04-07 ENCOUNTER — Other Ambulatory Visit: Payer: Self-pay | Admitting: Nurse Practitioner

## 2017-04-07 DIAGNOSIS — G893 Neoplasm related pain (acute) (chronic): Secondary | ICD-10-CM

## 2017-04-07 DIAGNOSIS — C3431 Malignant neoplasm of lower lobe, right bronchus or lung: Secondary | ICD-10-CM

## 2017-04-07 MED ORDER — OXYCODONE HCL 10 MG PO TABS: 10.0000 mg | ORAL_TABLET | Freq: Four times a day (QID) | ORAL | 0 refills | Status: DC | PRN

## 2017-04-07 NOTE — Telephone Encounter (Signed)
Deanna Blair informed of what has transpired and that he can get prescription

## 2017-04-07 NOTE — Telephone Encounter (Signed)
Pharmacy reports that [facility] # is expired, I called and confirmed this with [facility]

## 2017-04-07 NOTE — Telephone Encounter (Signed)
Sharyn Lull, Since the Rx was previously filled by Beckey Rutter, NP at [facility] cancer center, I sent the request to her.    Thanks! Mike Craze, NP [place] [phone]

## 2017-04-07 NOTE — Telephone Encounter (Signed)
Hi Lauren,   I think this refill for oxycodone for one of your patients came to [facility] by mistake.  Can you refill for patient please?   Thanks!  Mike Craze, NP [place] [phone]

## 2017-04-07 NOTE — Telephone Encounter (Signed)
Per Ander Purpura, her # does not expire until 2021. She has called [facility] back and they told her that they have a glitch in their system and apologized and are filling prescription for her

## 2017-04-11 ENCOUNTER — Ambulatory Visit: Payer: BLUE CROSS/BLUE SHIELD

## 2017-04-12 ENCOUNTER — Telehealth: Payer: Self-pay | Admitting: *Deleted

## 2017-04-12 ENCOUNTER — Inpatient Hospital Stay: Payer: BLUE CROSS/BLUE SHIELD | Attending: Oncology | Admitting: Oncology

## 2017-04-12 ENCOUNTER — Other Ambulatory Visit: Payer: Self-pay | Admitting: *Deleted

## 2017-04-12 VITALS — BP 154/105 | HR 65 | Temp 97.4°F | Resp 18 | Wt 121.0 lb

## 2017-04-12 DIAGNOSIS — N390 Urinary tract infection, site not specified: Secondary | ICD-10-CM | POA: Diagnosis not present

## 2017-04-12 DIAGNOSIS — Z8042 Family history of malignant neoplasm of prostate: Secondary | ICD-10-CM | POA: Insufficient documentation

## 2017-04-12 DIAGNOSIS — Z87891 Personal history of nicotine dependence: Secondary | ICD-10-CM | POA: Diagnosis not present

## 2017-04-12 DIAGNOSIS — L739 Follicular disorder, unspecified: Secondary | ICD-10-CM | POA: Insufficient documentation

## 2017-04-12 DIAGNOSIS — R300 Dysuria: Secondary | ICD-10-CM

## 2017-04-12 DIAGNOSIS — C3410 Malignant neoplasm of upper lobe, unspecified bronchus or lung: Secondary | ICD-10-CM

## 2017-04-12 DIAGNOSIS — K1230 Oral mucositis (ulcerative), unspecified: Secondary | ICD-10-CM | POA: Insufficient documentation

## 2017-04-12 DIAGNOSIS — K1231 Oral mucositis (ulcerative) due to antineoplastic therapy: Secondary | ICD-10-CM

## 2017-04-12 DIAGNOSIS — C3411 Malignant neoplasm of upper lobe, right bronchus or lung: Secondary | ICD-10-CM | POA: Diagnosis not present

## 2017-04-12 LAB — URINALYSIS, COMPLETE (UACMP) WITH MICROSCOPIC
Bilirubin Urine: NEGATIVE
GLUCOSE, UA: NEGATIVE mg/dL
Hgb urine dipstick: NEGATIVE
Ketones, ur: NEGATIVE mg/dL
NITRITE: NEGATIVE
PROTEIN: NEGATIVE mg/dL
SPECIFIC GRAVITY, URINE: 1.011 (ref 1.005–1.030)
pH: 7 (ref 5.0–8.0)

## 2017-04-12 NOTE — Progress Notes (Addendum)
Symptom Management Consult note [facility]  [phone] [phone]  Patient Care Team: Rubye Beach as PCP - General (Family Medicine) Allyne Gee, MD as Referring Physician (Internal Medicine)   Name of the patient: Mahoganie Basher  [id number]  [birth year]   Date of visit: 04/13/17  Diagnosis- Stage IIIB malignant neoplasm of trachea, bronchus and lung  Chief complaint/ Reason for visit- Recurrent folliculitis/abcess  Heme/Onc history: Patient was last seen by primary medical oncologist Dr. Rogue Bussing on 03/30/2017, s/p cycle 2 of Taxotere-Cyramza. 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 Cycle 3 was held due to leukocytosis. Plan was to get CT scan in approximately 2-3 weeks. She was started on Keflex 3 times a day for a skin rash on Buttocks/folliculitis.  Patient was initially diagnosed with adenocarcinoma right lower lung stage IIIB in February 2017. March 8 she began carbo/Alimta 4 cycles. Continued on Alimta/Avastin maintenance until slight progression on CT in October 2017. January through April 2018 patient received Nevo until progression. April 2018 patient received carbo/Taxol 3 cycles. In July 2018 Avastin was added to Carbo/Taxol 6 cycles until CT scan in September 2018 from [facility] revealing stable disease. September 2018 patient was on Avastin for maintenance therapy. January 2019 CT scan revealed right lung progression and Brain MRI revealing brain metastases. 02/17/2017 began Taxotere/Cyrmaza.   Interval history-   Patient presents for evaluation of a cutaneous abscess.  Lesion is located in the right buttock X 3 places. Onset was 2 days ago.  Symptoms have been well-controlled and been intermittent.  Abscess has  associated symptoms of pain.  Patient does have previous history of cutaneous abscesses.  Patient seen by Dr. Rogue Bussing and treated with seven days of PO Keflex for an abscess . Noted improvement but now having pain in same location/area. Notes sensitivity.     ECOG FS:1 - Symptomatic but completely ambulatory  Review of systems- Review of Systems  Constitutional: Negative.  Negative for chills, fever, malaise/fatigue and weight loss.  HENT: Negative for congestion and ear pain.   Eyes: Negative.  Negative for blurred vision and double vision.  Respiratory: Negative.  Negative for cough, sputum production and shortness of breath.   Cardiovascular: Negative.  Negative for chest pain, palpitations and leg swelling.  Gastrointestinal: Negative.  Negative for abdominal pain, constipation, diarrhea, nausea and vomiting.  Genitourinary: Negative for dysuria, frequency and urgency.  Musculoskeletal: Negative for back pain and falls.  Skin: Negative.  Negative for rash.       Abcess/folliculitis  Neurological: Negative.  Negative for weakness and headaches.  Endo/Heme/Allergies: Negative.  Does not bruise/bleed easily.  Psychiatric/Behavioral: Negative.  Negative for depression. The patient is not nervous/anxious and does not have insomnia.      Current treatment- S/p Cycle 2 Taxotere/Cyramza on 03/09/17. Cycle 4 held due to leukocytosis.  No Known Allergies   Past Medical History:  Diagnosis Date  . Anxiety   . Cancer of right lung ([place]) 02/13/2015, 01/13/17  . Depression   . Depression with anxiety    Well controlled with Wellbutrin and Buspar  . Pneumonia      Past Surgical History:  Procedure Laterality Date  . ECTOPIC PREGNANCY SURGERY  1988  . ELECTROMAGNETIC NAVIGATION BROCHOSCOPY N/A 02/10/2015   Procedure: ELECTROMAGNETIC NAVIGATION BRONCHOSCOPY;  Surgeon: Flora Lipps, MD;  Location: [facility];  Service: Cardiopulmonary;  Laterality: N/A;  . ENDOBRONCHIAL ULTRASOUND N/A 02/10/2015    Procedure: ENDOBRONCHIAL ULTRASOUND;  Surgeon: Flora Lipps, MD;  Location: [facility];  Service: Cardiopulmonary;  Laterality: N/A;  . PERIPHERAL VASCULAR CATHETERIZATION N/A 03/05/2015   Procedure: Glori Luis Cath Insertion;  Surgeon: Katha Cabal, MD;  Location: [facility];  Service: Cardiovascular;  Laterality: N/A;    Social History   Socioeconomic History  . Marital status: Married    Spouse name: Herbie Baltimore  . Number of children: 1  . Years of education: 31  . Highest education level: Not on file  Occupational History  . 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 Financial resource strain: Not on file  . Food insecurity:    Worry: Not on file    Inability: Not on file  . Transportation needs:    Medical: Not on file    Non-medical: Not on file  Tobacco Use  . Smoking status: Former Smoker    Packs/day: 0.25    Years: 30.00    Pack years: 7.50    Types: Cigarettes    Last attempt to quit: 02/06/2005    Years since quitting: 12.1  . Smokeless tobacco: Never Used  Substance and Sexual Activity  . Alcohol use: Yes    Alcohol/week: 3.0 oz    Types: 5 Cans of beer per week    Comment: 5 beers weekly  . Drug use: No  . Sexual activity: Yes    Partners: Male  Lifestyle  . Physical activity:    Days per week: Not on file    Minutes per session: Not on file  . Stress: Not on file  Relationships  . Social connections:    Talks on phone: Not on file    Gets together: Not on file    Attends religious service: Not on file    Active member of club or organization: Not on file    Attends meetings of clubs or organizations: Not on file    Relationship status: Not on file  . Intimate partner violence:    Fear of current or ex partner: Not on file    Emotionally abused: Not on file    Physically abused: Not on file    Forced sexual activity: Not on file  Other Topics Concern  . Not on file  Social History Narrative    [name] was born in [place], [place]. She moved with her family with [facility] and lived there for 13 years. She recently moved to [place] approximately 1 year ago. She lives with her husband of 28 years. They have an adult daughter who lives in [place]. 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 She is enjoying driving around and getting to know [place]. She and her husband enjoy cooking and baking. They also enjoy hiking when the weather permits.    Family History  Problem Relation Age of Onset  . Cancer Father        Prostate Cancer  . Hypertension Father   . Stroke Father   . Hypertension Brother      Current Outpatient Medications:  .  acetaminophen (TYLENOL) 500 MG tablet, Take 1,000 mg by mouth every 4 (four) hours as needed. , Disp: , Rfl:  .  dexamethasone (DECADRON) 4 MG tablet, Take 1 tablet (4 mg total) by mouth 2 (two) times daily with a meal. Start the day prior to chemo; for 3  days., Disp: 60 tablet, Rfl: 3 .  docusate sodium (COLACE) 100 MG capsule, Take 100 mg by mouth 2 (two) times daily., Disp: , Rfl:  .  fentaNYL (DURAGESIC - DOSED MCG/HR) 25 MCG/HR patch, Place 1 patch (25 mcg total) onto the skin every 3 (three) days., Disp: 10 patch, Rfl: 0 .  Fluticasone-Umeclidin-Vilant (TRELEGY ELLIPTA) 100-62.5-25 MCG/INH AEPB, Inhale 1 puff into the lungs daily., Disp: 1 each, Rfl: 4 .  magic mouthwash w/lidocaine SOLN, Take 5 mLs by mouth 4 (four) times daily., Disp: 480 mL, Rfl: 3 .  Oxycodone HCl 10 MG TABS, Take 1 tablet (10 mg total) by mouth every 6 (six) hours as needed (moderate to severe pain)., Disp: 60 tablet, Rfl: 0 .  zolpidem (AMBIEN) 5 MG tablet, TAKE 1 TABLET BY MOUTH AT BEDTIME AS NEEDED FOR SLEEP, Disp: 30 tablet, Rfl: 2 .  amoxicillin-clavulanate (AUGMENTIN) 875-125 MG tablet, Take 1 tablet by mouth 2 (two) times daily., Disp: 14 tablet, Rfl: 0 .  ibuprofen (IBU) 400 MG tablet, Take 1 tablet (400 mg total) by mouth 3 (three) times daily  as needed. (Patient not taking: Reported on 03/30/2017), Disp: 30 tablet, Rfl: 0 .  lidocaine-prilocaine (EMLA) cream, Apply 1 application topically as needed., Disp: 30 g, Rfl: 6 .  ondansetron (ZOFRAN) 8 MG tablet, Take 1 tablet (8 mg total) by mouth every 8 (eight) hours as needed for nausea or vomiting (start 3 days; after chemo). (Patient not taking: Reported on 03/30/2017), Disp: 40 tablet, Rfl: 1 .  prochlorperazine (COMPAZINE) 10 MG tablet, Take 1 tablet (10 mg total) by mouth every 6 (six) hours as needed for nausea or vomiting. (Patient not taking: Reported on 03/30/2017), Disp: 25 tablet, Rfl: 1  Physical exam:  Vitals:   04/12/17 1443  BP: (!) 154/105  Pulse: 65  Resp: 18  Temp: (!) 97.4 F (36.3 C)  TempSrc: Oral  Weight: 121 lb (54.9 kg)   Physical Exam  Constitutional: She is oriented to person, place, and time and well-developed, well-nourished, and in no distress. Vital signs are normal.  HENT:  Head: Normocephalic and atraumatic.  Mouth/Throat: No oropharyngeal exudate.    Eyes: Pupils are equal, round, and reactive to light.  Neck: Normal range of motion.  Cardiovascular: Normal rate, regular rhythm and normal heart sounds.  No murmur heard. Pulmonary/Chest: Effort normal and breath sounds normal. She has no wheezes.  Abdominal: Soft. Normal appearance and bowel sounds are normal. She exhibits no distension. There is no tenderness.  Musculoskeletal: Normal range of motion. She exhibits no edema.  Neurological: She is alert and oriented to person, place, and time. Gait normal.  Skin: Skin is warm and dry. No rash noted.     Psychiatric: Mood, memory, affect and judgment normal.     CMP Latest Ref Rng & Units 03/30/2017  Glucose 65 - 99 mg/dL 183(H)  BUN 6 - 20 mg/dL 22(H)  Creatinine 0.44 - 1.00 mg/dL 0.88  Sodium 135 - 145 mmol/L 140  Potassium 3.5 - 5.1 mmol/L 3.4(L)  Chloride 101 - 111 mmol/L 105  CO2 22 - 32 mmol/L 22  Calcium 8.9 - 10.3 mg/dL 9.5    Total Protein 6.5 - 8.1 g/dL 7.0  Total Bilirubin 0.3 - 1.2 mg/dL 0.5  Alkaline Phos 38 - 126 U/L 94  AST 15 - 41 U/L 36  ALT 14 - 54 U/L 18   CBC Latest Ref Rng & Units 03/30/2017  WBC 3.6 - 11.0 K/uL 20.5(H)  Hemoglobin 12.0 -  16.0 g/dL 11.7(L)  Hematocrit 35.0 - 47.0 % 34.1(L)  Platelets 150 - 440 K/uL 251    No images are attached to the encounter.  Ct Outside Films Chest  Result Date: 04/05/2017 This examination belongs to an outside facility and is stored here for comparison purposes only.  Contact the originating outside institution for any associated report or interpretation.  Ct Outside Films Chest  Result Date: 04/05/2017 This examination belongs to an outside facility and is stored here for comparison purposes only.  Contact the originating outside institution for any associated report or interpretation.  Ct Outside Films Chest  Result Date: 04/05/2017 This examination belongs to an outside facility and is stored here for comparison purposes only.  Contact the originating outside institution for any associated report or interpretation.  Mr Outside Films Head/face  Result Date: 04/05/2017 This examination belongs to an outside facility and is stored here for comparison purposes only.  Contact the originating outside institution for any associated report or interpretation.  Mr Outside Films Head/face  Result Date: 04/05/2017 This examination belongs to an outside facility and is stored here for comparison purposes only.  Contact the originating outside institution for any associated report or interpretation.  Mr Outside Films Head/face  Result Date: 04/05/2017 This examination belongs to an outside facility and is stored here for comparison purposes only.  Contact the originating outside institution for any associated report or interpretation.  Ct Outside Films Body  Result Date: 04/05/2017 This examination belongs to an outside facility and is stored here for comparison  purposes only.  Contact the originating outside institution for any associated report or interpretation.    Assessment and plan- Patient is a 62 y.o. female who presents for skin break down on right side of buttocks.  1. Adenocarcinoma of the lung right side: Currently on Taxotere/Cyramza cycle 2 on 03/09/17. Cycle 4 on hold due to poor tolerance and leukocytosis. Patient receiving Neulasta. Patient is scheduled to visit the [facility] next week (04/19/14-04/22/17). She confirmed that she will have both an MRI of the brain with radiation oncology and a CT of the chest with medical oncology at the [facility]. Patient canceled imaging with [place] here at [facility]. Dr. Rogue Bussing aware. Patient would like to cancel appointments for next week, given she will be at the [facility], and reschedule for 05/04/2017. Will let scheduling know and have appts adjusted.   2. Dysuria: Will get urinalysis and urine culture. UA variable: She does not appear symptomatic. Urine Culture: EColi UTI: Patient was started on Augmentin for 7 days for skin infection on her buttocks. Augmentin additionally covers Escherichia coli in the urine. Patient notified of findings. She is tolerating Augmentin well. Rash has additionally improved with antibiotic and she added neosporin to tender areas with improvement. She offered no further concerns today.   3. Recurrent folliculitis/abscess/skin breakdown: Patient admits to painful, 3 out of 10 pain to right buttocks. Attempted to retrieve a culture and sensitivity but there is no fluctuant head or abscess present. It appears to be skin breakdown in several different spots. Stage II pressure ulcers although not located at boney process. Consulted with Dr. Rogue Bussing and it is recommended to try RX 875-125 mg PO BID for 7 days. Patient called and informed of prescription. Side effects discussed with patient. All questions were answered.  4. Oral mucositis: Continue magic mouthwash and  nystatin as prescribed. On examination, no thrush present at visible sores on tongue.     Visit Diagnosis 1.  Folliculitis   2. Dysuria   3. Mucositis due to chemotherapy   4. Malignant neoplasm of upper lobe, unspecified bronchus or lung ([place])     Patient expressed understanding and was in agreement with this plan. She also understands that She can call clinic at any time with any questions, concerns, or complaints.   Greater than 50% was spent in counseling and coordination of care with this patient including but not limited to discussion of the relevant topics above (See A&P) including, but not limited to diagnosis and management of acute and chronic medical conditions.    Faythe Casa, AGNP-C [facility] at [place]- [phone] Pager- [phone] 04/13/2017 9:55 AM

## 2017-04-12 NOTE — Telephone Encounter (Signed)
Patient called reporting that she has a boil coming back on her "butt" and has completed her antibiotics she was given for it.  Per Lorretta Harp, have her come in. Patient accepted appointment for 3 PM

## 2017-04-13 MED ORDER — AMOXICILLIN-POT CLAVULANATE 875-125 MG PO TABS: 1.0000 | ORAL_TABLET | Freq: Two times a day (BID) | ORAL | 0 refills | Status: DC

## 2017-04-15 LAB — URINE CULTURE

## 2017-04-20 ENCOUNTER — Ambulatory Visit: Payer: BLUE CROSS/BLUE SHIELD | Admitting: Internal Medicine

## 2017-04-20 ENCOUNTER — Other Ambulatory Visit: Payer: BLUE CROSS/BLUE SHIELD

## 2017-04-20 ENCOUNTER — Ambulatory Visit: Payer: BLUE CROSS/BLUE SHIELD

## 2017-04-26 ENCOUNTER — Other Ambulatory Visit: Payer: Self-pay | Admitting: Nurse Practitioner

## 2017-04-27 ENCOUNTER — Ambulatory Visit: Payer: BLUE CROSS/BLUE SHIELD | Admitting: Internal Medicine

## 2017-04-27 DIAGNOSIS — R0602 Shortness of breath: Secondary | ICD-10-CM | POA: Diagnosis not present

## 2017-04-27 LAB — PULMONARY FUNCTION TEST

## 2017-04-28 ENCOUNTER — Other Ambulatory Visit: Payer: Self-pay | Admitting: Internal Medicine

## 2017-04-28 ENCOUNTER — Other Ambulatory Visit: Payer: Self-pay | Admitting: Nurse Practitioner

## 2017-04-28 ENCOUNTER — Ambulatory Visit: Payer: Self-pay | Admitting: Internal Medicine

## 2017-04-28 DIAGNOSIS — G893 Neoplasm related pain (acute) (chronic): Secondary | ICD-10-CM

## 2017-04-28 DIAGNOSIS — C3431 Malignant neoplasm of lower lobe, right bronchus or lung: Secondary | ICD-10-CM

## 2017-04-28 MED ORDER — IBUPROFEN 400 MG PO TABS: 400.0000 mg | ORAL_TABLET | Freq: Three times a day (TID) | ORAL | 0 refills | Status: DC | PRN

## 2017-04-28 NOTE — Telephone Encounter (Signed)
Deanna Blair, you last saw this patient so I am forwarding this to you. Don't know why it keeps coming to [facility] in basket. Thanks, [name]

## 2017-04-29 ENCOUNTER — Other Ambulatory Visit: Payer: Self-pay | Admitting: Nurse Practitioner

## 2017-04-29 DIAGNOSIS — C3431 Malignant neoplasm of lower lobe, right bronchus or lung: Secondary | ICD-10-CM

## 2017-04-29 DIAGNOSIS — G893 Neoplasm related pain (acute) (chronic): Secondary | ICD-10-CM

## 2017-04-29 MED ORDER — OXYCODONE HCL 10 MG PO TABS: 10.0000 mg | ORAL_TABLET | Freq: Four times a day (QID) | ORAL | 0 refills | Status: DC | PRN

## 2017-04-29 NOTE — Telephone Encounter (Signed)
This is not an [facility] patient. She goes to [facility], I believe.

## 2017-05-04 ENCOUNTER — Inpatient Hospital Stay: Payer: BLUE CROSS/BLUE SHIELD | Attending: Internal Medicine

## 2017-05-04 ENCOUNTER — Inpatient Hospital Stay (HOSPITAL_BASED_OUTPATIENT_CLINIC_OR_DEPARTMENT_OTHER): Payer: BLUE CROSS/BLUE SHIELD | Admitting: Internal Medicine

## 2017-05-04 ENCOUNTER — Encounter: Payer: Self-pay | Admitting: Internal Medicine

## 2017-05-04 ENCOUNTER — Inpatient Hospital Stay: Payer: BLUE CROSS/BLUE SHIELD

## 2017-05-04 VITALS — BP 132/80 | Temp 97.4°F | Resp 16 | Wt 120.2 lb

## 2017-05-04 VITALS — HR 80

## 2017-05-04 DIAGNOSIS — Z87891 Personal history of nicotine dependence: Secondary | ICD-10-CM

## 2017-05-04 DIAGNOSIS — K1231 Oral mucositis (ulcerative) due to antineoplastic therapy: Secondary | ICD-10-CM | POA: Diagnosis not present

## 2017-05-04 DIAGNOSIS — Z5112 Encounter for antineoplastic immunotherapy: Secondary | ICD-10-CM | POA: Diagnosis present

## 2017-05-04 DIAGNOSIS — Z79899 Other long term (current) drug therapy: Secondary | ICD-10-CM | POA: Insufficient documentation

## 2017-05-04 DIAGNOSIS — G620 Drug-induced polyneuropathy: Secondary | ICD-10-CM | POA: Insufficient documentation

## 2017-05-04 DIAGNOSIS — C3431 Malignant neoplasm of lower lobe, right bronchus or lung: Secondary | ICD-10-CM | POA: Insufficient documentation

## 2017-05-04 DIAGNOSIS — Z5111 Encounter for antineoplastic chemotherapy: Secondary | ICD-10-CM | POA: Insufficient documentation

## 2017-05-04 DIAGNOSIS — G893 Neoplasm related pain (acute) (chronic): Secondary | ICD-10-CM | POA: Insufficient documentation

## 2017-05-04 DIAGNOSIS — C7931 Secondary malignant neoplasm of brain: Secondary | ICD-10-CM | POA: Insufficient documentation

## 2017-05-04 DIAGNOSIS — J029 Acute pharyngitis, unspecified: Secondary | ICD-10-CM | POA: Insufficient documentation

## 2017-05-04 DIAGNOSIS — Z8249 Family history of ischemic heart disease and other diseases of the circulatory system: Secondary | ICD-10-CM | POA: Insufficient documentation

## 2017-05-04 DIAGNOSIS — Z8042 Family history of malignant neoplasm of prostate: Secondary | ICD-10-CM

## 2017-05-04 LAB — URINALYSIS, COMPLETE (UACMP) WITH MICROSCOPIC
BACTERIA UA: NONE SEEN
Bilirubin Urine: NEGATIVE
GLUCOSE, UA: NEGATIVE mg/dL
HGB URINE DIPSTICK: NEGATIVE
Ketones, ur: 5 mg/dL — AB
NITRITE: NEGATIVE
PH: 5 (ref 5.0–8.0)
Protein, ur: 30 mg/dL — AB
Specific Gravity, Urine: 1.023 (ref 1.005–1.030)

## 2017-05-04 LAB — CBC WITH DIFFERENTIAL/PLATELET
BASOS ABS: 0 10*3/uL (ref 0–0.1)
Basophils Relative: 1 %
Eosinophils Absolute: 0 10*3/uL (ref 0–0.7)
Eosinophils Relative: 0 %
HEMATOCRIT: 31.7 % — AB (ref 35.0–47.0)
HEMOGLOBIN: 11.2 g/dL — AB (ref 12.0–16.0)
Lymphocytes Relative: 7 %
Lymphs Abs: 0.5 10*3/uL — ABNORMAL LOW (ref 1.0–3.6)
MCH: 39.2 pg — ABNORMAL HIGH (ref 26.0–34.0)
MCHC: 35.2 g/dL (ref 32.0–36.0)
MCV: 111.3 fL — ABNORMAL HIGH (ref 80.0–100.0)
Monocytes Absolute: 0.2 10*3/uL (ref 0.2–0.9)
Monocytes Relative: 2 %
NEUTROS ABS: 6.8 10*3/uL — AB (ref 1.4–6.5)
NEUTROS PCT: 90 %
Platelets: 267 10*3/uL (ref 150–440)
RBC: 2.85 MIL/uL — ABNORMAL LOW (ref 3.80–5.20)
RDW: 17.7 % — ABNORMAL HIGH (ref 11.5–14.5)
WBC: 7.5 10*3/uL (ref 3.6–11.0)

## 2017-05-04 LAB — COMPREHENSIVE METABOLIC PANEL
ALT: 14 U/L (ref 14–54)
AST: 27 U/L (ref 15–41)
Albumin: 3.5 g/dL (ref 3.5–5.0)
Alkaline Phosphatase: 55 U/L (ref 38–126)
Anion gap: 13 (ref 5–15)
BILIRUBIN TOTAL: 0.5 mg/dL (ref 0.3–1.2)
BUN: 21 mg/dL — ABNORMAL HIGH (ref 6–20)
CALCIUM: 9.3 mg/dL (ref 8.9–10.3)
CO2: 20 mmol/L — ABNORMAL LOW (ref 22–32)
Chloride: 103 mmol/L (ref 101–111)
Creatinine, Ser: 0.76 mg/dL (ref 0.44–1.00)
GFR calc non Af Amer: >60 mL/min (ref >60)
Glucose, Bld: 223 mg/dL — ABNORMAL HIGH (ref 65–99)
Potassium: 4.2 mmol/L (ref 3.5–5.1)
Sodium: 136 mmol/L (ref 135–145)
TOTAL PROTEIN: 7.1 g/dL (ref 6.5–8.1)

## 2017-05-04 MED ORDER — SODIUM CHLORIDE 0.9 % IV SOLN
Freq: Once | INTRAVENOUS | Status: AC
  Administered 2017-05-04: 10:00:00 via INTRAVENOUS
  Filled 2017-05-04: qty 1000

## 2017-05-04 MED ORDER — HEPARIN SOD (PORK) LOCK FLUSH 100 UNIT/ML IV SOLN
500.0000 [IU] | Freq: Once | INTRAVENOUS | Status: AC
Start: 2017-05-04 — End: 2017-05-04
  Administered 2017-05-04: 500 [IU] via INTRAVENOUS

## 2017-05-04 MED ORDER — SODIUM CHLORIDE 0.9 % IV SOLN: 10.0000 mg | Freq: Once | INTRAVENOUS | Status: DC

## 2017-05-04 MED ORDER — RAMUCIRUMAB CHEMO INJECTION 500 MG/50ML
10.0000 mg/kg | Freq: Once | INTRAVENOUS | Status: AC
  Administered 2017-05-04: 600 mg via INTRAVENOUS
  Filled 2017-05-04: qty 50

## 2017-05-04 MED ORDER — FENTANYL 25 MCG/HR TD PT72: 25.0000 ug | MEDICATED_PATCH | TRANSDERMAL | 0 refills | Status: DC

## 2017-05-04 MED ORDER — DIPHENHYDRAMINE HCL 50 MG/ML IJ SOLN
50.0000 mg | Freq: Once | INTRAMUSCULAR | Status: AC
Start: 2017-05-04 — End: 2017-05-04
  Administered 2017-05-04: 50 mg via INTRAVENOUS
  Filled 2017-05-04: qty 1

## 2017-05-04 MED ORDER — SODIUM CHLORIDE 0.9 % IV SOLN
60.0000 mg/m2 | Freq: Once | INTRAVENOUS | Status: AC
  Administered 2017-05-04: 100 mg via INTRAVENOUS
  Filled 2017-05-04: qty 10

## 2017-05-04 MED ORDER — ACETAMINOPHEN 325 MG PO TABS
650.0000 mg | ORAL_TABLET | Freq: Once | ORAL | Status: AC
  Administered 2017-05-04: 650 mg via ORAL
  Filled 2017-05-04: qty 2

## 2017-05-04 MED ORDER — DEXAMETHASONE SODIUM PHOSPHATE 10 MG/ML IJ SOLN
10.0000 mg | Freq: Once | INTRAMUSCULAR | Status: AC
  Administered 2017-05-04: 10 mg via INTRAVENOUS
  Filled 2017-05-04: qty 1

## 2017-05-04 NOTE — Progress Notes (Signed)
[place] OFFICE PROGRESS NOTE  Patient Care Team: Mar Daring, PA-C as PCP - General (Family Medicine) Allyne Gee, MD as Referring Physician (Internal Medicine)  Malignant neoplasm of trachea, bronchus, and lung [facility])   Staging form: Lung, AJCC 7th Edition     Clinical: Stage IIIB (T4, N2, M0) - Signed by Cammie Sickle, MD on 06/24/2015    Oncology History   # FEB 2017-[place] CA Right Lower Lung T4N2M0- STAGE IIIB [Bronch & Subcarinal LNBx]- March 8th START CARBO-ALIMTA x4 cycles- June 8th 2017 - IMPROVED FDG MULTI-FOCAL lesions/N2-LN activity. S/p [place]-- Alimta x6  # AUG 18th- [place]- Avastin maintenance; SEP 13th DISCONTINUE AVASTIN [sec to cavitary lesion]; OCT 20th 2017- CT-STABLE Disease; NOV 8th CT/PET [facility] clinic]- "overall slight progression-? 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 The main right frontal lesion gotten smaller; improve edema.  # Jan 2019- Progression [CT chest; [facility]]; Brain MRI- new sub cm lesions- monitored  # feb 14th 2019- Tax-Cyram   --------------------------------------------------------------     MOLECULAR STUDIES:  KRAS MUTATED/Tissues not sufficient for OTHER the molecular marker; will need repeat Bx ; GUARDIANT TESTING [[facility]]- pending. [id number]  [facility] No targettable mutations.      Primary cancer of right lower lobe of lung ([facility]    INTERVAL HISTORY: WaNyleah Mcginnis89.o.  female pleasant patient above history of Recurrent Metastatic adenocarcinoma the lung; And brain metastases- Currently on Taxotere-Cyramza status post cycle #2 is here for follow-up.  In the interim [facility]restaging showed-improved consolidation-and she is recommended continued current therapy with modifications.  Brain MRI that showed-small metastases/but not symptomatic.  Patient today feels much improved over all.  Body aches joint pains are improved since starting the fentanyl patch.;  She continues to take oxycodone up to 3 a day.  Patient denies any worsening shortness of breath or cough.  Denies any chest pain.  No back pain.  Patient denies any worsening of the tingling and numbness in the legs.  REVIEW OF SYSTEMS:  A complete 10 point review of system is done which is negative except mentioned above/history of present illness.   PAST MEDICAL HISTORY :  Past Medical History:  Diagnosis Date  . Anxiety   . [place] right [place], 01/13/17  . Depression   . Depression with anxiety    Well controlled with Wellbutrin and Buspar  . Pneumonia     PAST SURGICAL HISTORY :   Past Surgical History:  Procedure Laterality Date  . ECTOPIC PREGNANCY SURGERY  1988  . ELECTROMAGNETIC NAVIGATION BROCHOSCOPY N/A 02/10/2015   Procedure: ELECTROMAGNETIC NAVIGATION BRONCHOSCOPY;  Surgeon: KuFlora LippsMD;  Location: [facility] ORS;  Service: Cardiopulmonary;  Laterality: N/A;  . ENDOBRONCHIAL ULTRASOUND N/A 02/10/2015   Procedure:  ENDOBRONCHIAL ULTRASOUND;  Surgeon: Flora Lipps, MD;  Location: [facility] ORS;  Service: Cardiopulmonary;  Laterality: N/A;  . PERIPHERAL VASCULAR CATHETERIZATION N/A 03/05/2015   Procedure: Glori Luis Cath Insertion;  Surgeon: Katha Cabal, MD;  Location:  [place] CV LAB;  Service: Cardiovascular;  Laterality: N/A;    FAMILY HISTORY :   Family History  Problem Relation Age of Onset  . Cancer Father        Prostate Cancer  . Hypertension Father   . Stroke Father   . Hypertension Brother     SOCIAL HISTORY:   Social History   Tobacco Use  . Smoking status: Former Smoker    Packs/day: 0.25    Years: 30.00    Pack years: 7.50    Types: Cigarettes    Last attempt to quit: 02/06/2005    Years since quitting: 12.2  . Smokeless tobacco: Never Used  Substance Use Topics  . Alcohol use: Yes    Alcohol/week: 3.0 oz    Types: 5 Cans of beer per week    Comment: 5 beers weekly  . Drug use: No    ALLERGIES:  has No Known Allergies.  MEDICATIONS:  Current Outpatient Medications  Medication Sig Dispense Refill  . acetaminophen (TYLENOL) 500 MG tablet Take 1,000 mg by mouth every 4 (four) hours as needed.     [name] Kitchen dexamethasone (DECADRON) 4 MG tablet Take 1 tablet (4 mg total) by mouth 2 (two) times daily with a meal. Start the day prior to chemo; for 3 days. 60 tablet 3  . docusate sodium (COLACE) 100 MG capsule Take 100 mg by mouth 2 (two) times daily.    . fentaNYL (DURAGESIC - DOSED MCG/HR) 25 MCG/HR patch Place 1 patch (25 mcg total) onto the skin every 3 (three) days. 10 patch 0  . Fluticasone-Umeclidin-Vilant (TRELEGY ELLIPTA) 100-62.5-25 MCG/INH AEPB Inhale 1 puff into the lungs daily. 1 each 4  . ibuprofen (IBU) 400 MG tablet Take 1 tablet (400 mg total) by mouth 3 (three) times daily as needed. 30 tablet 0  . lidocaine-prilocaine (EMLA) cream Apply 1 application topically as needed. 30 g 6  . magic mouthwash w/lidocaine SOLN Take 5 mLs by mouth 4 (four) times daily. 480 mL 3  . ondansetron (ZOFRAN) 8 MG tablet Take 1 tablet (8 mg total) by mouth every 8 (eight) hours as needed for nausea or vomiting (start 3 days; after chemo). 40 tablet 1  . Oxycodone HCl 10 MG TABS Take 1 tablet (10 mg total) by mouth every 6 (six) hours as  needed (moderate to severe pain). 60 tablet 0  . prochlorperazine (COMPAZINE) 10 MG tablet Take 1 tablet (10 mg total) by mouth every 6 (six) hours as needed for nausea or vomiting. 25 tablet 1  . zolpidem (AMBIEN) 5 MG tablet TAKE 1 TABLET BY MOUTH AT BEDTIME AS NEEDED FOR SLEEP 30 tablet 2  . amoxicillin-clavulanate (AUGMENTIN) 875-125 MG tablet Take 1 tablet by mouth 2 (two) times daily. (Patient not taking: Reported on 05/04/2017) 14 tablet 0   No current facility-administered medications for this visit.     PHYSICAL EXAMINATION: ECOG PERFORMANCE STATUS: 0 - Asymptomatic  BP 132/80 (BP Location: Left Arm, Patient Position: Sitting)   Temp (!) 97.4 F (36.3 C) (Tympanic)   Resp 16   Wt 120 lb 3.2 oz (54.5 kg)   LMP 06/16/2000 (Approximate) Comment: 15 yrs ago  BMI 21.29 kg/m   Filed Weights   05/04/17 0911  Weight: 120 lb 3.2  oz (54.5 kg)    GENERAL: Well-nourished well-developed; Alert, no distress and comfortable. She is accompanied by her husband.   EYES: no pallor or icterus OROPHARYNX: no thrush or ulceration; no thrush noted. NECK: supple, no masses felt LYMPH:  no palpable lymphadenopathy in the cervical, axillary or inguinal regions LUNGS: clear to auscultation and  No wheeze or crackles HEART/CVS: regular rate & rhythm and no murmurs; No lower extremity edema;No erythema. ABDOMEN:abdomen soft, non-tender and normal bowel sounds Musculoskeletal:no cyanosis of digits and no clubbing  PSYCH: alert & oriented x 3 with fluent speech.  NEURO: no focal motor/sensory deficits SKIN: No rash.   LABORATORY DATA:  I have reviewed the data as listed    Component Value Date/Time   NA 136 05/04/2017 0842   NA 139 01/03/2015 0910   K 4.2 05/04/2017 0842   CL 103 05/04/2017 0842   CO2 20 (L) 05/04/2017 0842   GLUCOSE 223 (H) 05/04/2017 0842   BUN 21 (H) 05/04/2017 0842   BUN 10 01/03/2015 0910   CREATININE 0.76 05/04/2017 0842   CALCIUM 9.3 05/04/2017 0842   PROT 7.1  05/04/2017 0842   PROT 6.5 01/03/2015 0910   ALBUMIN 3.5 05/04/2017 0842   ALBUMIN 3.8 01/03/2015 0910   AST 27 05/04/2017 0842   ALT 14 05/04/2017 0842   ALKPHOS 55 05/04/2017 0842   BILITOT 0.5 05/04/2017 0842   BILITOT <0.2 01/03/2015 0910   GFRNONAA >60 05/04/2017 0842   GFRAA >60 05/04/2017 0842    No results found for: SPEP, UPEP  Lab Results  Component Value Date   WBC 7.5 05/04/2017   NEUTROABS 6.8 (H) 05/04/2017   HGB 11.2 (L) 05/04/2017   HCT 31.7 (L) 05/04/2017   MCV 111.3 (H) 05/04/2017   PLT 267 05/04/2017      Chemistry      Component Value Date/Time   NA 136 05/04/2017 0842   NA 139 01/03/2015 0910   K 4.2 05/04/2017 0842   CL 103 05/04/2017 0842   CO2 20 (L) 05/04/2017 0842   BUN 21 (H) 05/04/2017 0842   BUN 10 01/03/2015 0910   CREATININE 0.76 05/04/2017 0842      Component Value Date/Time   CALCIUM 9.3 05/04/2017 0842   ALKPHOS 55 05/04/2017 0842   AST 27 05/04/2017 0842   ALT 14 05/04/2017 0842   BILITOT 0.5 05/04/2017 0842   BILITOT <0.2 01/03/2015 0910     -April 16th 2019 [facility]]  IMPRESSION: MRI Brain  1. The 3 to 4 mm punctate enhancing lesion in the right parietal lobe appears larger compared to MRI 01/12/2017. 2. Remainder of the previously seen enhancing lesions are stable. 3. No definite new lesions seen.  IMPRESSION:  1. 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 2. No CT evidence of new thoracic metastases  IMPRESSION:  No evidence of metastatic disease in the abdomen/pelvis.  RADIOGRAPHIC STUDIES: I have personally reviewed the radiological images as listed and agreed with the findings in the report. No results found.   ASSESSMENT & PLAN:  Primary cancer of right lower lobe of lung ([place]) # T4 N2 M1-adenocarcinoma of the lung right side-currently on Taxotere'-Cyramza status post cycle #2-April 2019 [[facility]]-shows improvement of lung disease [consolidation]; April 2019 MRI brain shows-2 to 3 mm  lesion in the brain stable.  #Given the poor tolerance to standard chemotherapy; but good clinical benefit/partial response-I recommend proceed with cycle # 3 Taxoetere-Ram [decreased dose of Tax- to 40m/m2]; will discontinue growth factor support  because of body aches.  Also as per recommendations from [place] spacing out the chemotherapy to every 4 weeks.  # oral mucositis-second to chemotherapy; currently resolved.  Recommend prophylactic salt and baking soda rinses  # Pain sec to malignancy/chemotherapy-continue Fenatny 25 mcg; oxycodone new prescription given.  # Hx of brain metastases- s/p GK in [place]; but new 2 mm lesion noted- STABLE on MRI April 16th 2019. [[facility]]  # PN-G-1.-2  Sec to taxol; neurotin TID-improving.  # follow up in 2 weeks/[facility] if possible/labs; follow up with me in 4 weeks-lab/Chemo.   I also reviewed the notes from [facility] dated April 2019 extensively summarized as above  Orders Placed This Encounter  Procedures  . Basic metabolic panel    Standing Status:   Future    Standing Expiration Date:   05/04/2018  . CBC with Differential    Standing Status:   Future    Standing Expiration Date:   05/04/2018  . Comprehensive metabolic panel    Standing Status:   Future    Standing Expiration Date:   05/04/2018  . CBC with Differential    Standing Status:   Future    Standing Expiration Date:   05/04/2018     Cammie Sickle, MD 05/05/2017 9:19 AM

## 2017-05-04 NOTE — Assessment & Plan Note (Addendum)
#   T4 N2 M1-adenocarcinoma of the lung right side-currently on Taxotere'-Cyramza status post cycle #2-April 2019 [[facility]]-shows improvement of lung disease [consolidation]; April 2019 MRI brain shows-2 to 3 mm lesion in the brain stable.  #Given the poor tolerance to standard chemotherapy; but good clinical benefit/partial response-I recommend proceed with cycle # 3 Taxoetere-Ram [decreased dose of Tax- to 60mg /m2]; will discontinue growth factor support because of body aches.  Also as per recommendations from [place] spacing out the chemotherapy to every 4 weeks.  # oral mucositis-second to chemotherapy; currently resolved.  Recommend prophylactic salt and baking soda rinses  # Pain sec to malignancy/chemotherapy-continue Fenatny 25 mcg; oxycodone new prescription given.  # Hx of brain metastases- s/p GK in [place]; but new 2 mm lesion noted- STABLE on MRI April 16th 2019. [[facility]]  # PN-G-1.-2  Sec to taxol; neurotin TID-improving.  # follow up in 2 weeks/[facility] if possible/labs; follow up with me in 4 weeks-lab/Chemo.   I also reviewed the notes from [facility] dated April 2019 extensively summarized as above  # 40 minutes face-to-face with the patient discussing the above plan of care; more than 50% of time spent on prognosis/ natural history; counseling and coordination.

## 2017-05-05 ENCOUNTER — Other Ambulatory Visit: Payer: Self-pay | Admitting: Internal Medicine

## 2017-05-06 ENCOUNTER — Other Ambulatory Visit: Payer: Self-pay | Admitting: *Deleted

## 2017-05-06 MED ORDER — FENTANYL 25 MCG/HR TD PT72: 25.0000 ug | MEDICATED_PATCH | TRANSDERMAL | 0 refills | Status: DC

## 2017-05-06 NOTE — Telephone Encounter (Signed)
Patient called reporting that [facility] does not have Fentanyl, but [place] does have it and needs new prescription sent to [facility]

## 2017-05-09 ENCOUNTER — Ambulatory Visit: Payer: Self-pay | Admitting: Internal Medicine

## 2017-05-09 NOTE — Procedures (Signed)
[place], [id number]  DATE OF SERVICE: April 27, 2017  Complete Pulmonary Function Testing Interpretation:  FINDINGS:   the forced vital capacity is mildly decreased.  The FEV1 is 1.73 L which is 78% of predicted and is mildly decreased.  The FEV1 FVC ratio is normal.  Total lung capacity is normal residual volume is normal residual volume total lung capacity ratio is increased.  The FRC is normal.  DLCO is moderately reduced.  DLCO corrected for alveolar volume is reduced.  Post bronchodilator there is no significant improvement in the FEV1 however clinical improvement may occur in the absence of spirometric improvement and does not preclude the use of bronchodilators  IMPRESSION:   mild obstructive lung disease. There was no significant improvement after bronchodilator was given however clinical improvement may still occur in the absence of spirometric improvement.   The DLCO is moderately reduced and remains reduced when corrected for alveolar volume  Allyne Gee, MD [facility] Pulmonary Critical Care Medicine Sleep Medicine

## 2017-05-18 ENCOUNTER — Inpatient Hospital Stay (HOSPITAL_BASED_OUTPATIENT_CLINIC_OR_DEPARTMENT_OTHER): Payer: BLUE CROSS/BLUE SHIELD | Admitting: Nurse Practitioner

## 2017-05-18 ENCOUNTER — Encounter: Payer: Self-pay | Admitting: Nurse Practitioner

## 2017-05-18 ENCOUNTER — Inpatient Hospital Stay: Payer: BLUE CROSS/BLUE SHIELD

## 2017-05-18 ENCOUNTER — Other Ambulatory Visit: Payer: Self-pay | Admitting: Nurse Practitioner

## 2017-05-18 VITALS — BP 123/78 | HR 69 | Temp 97.4°F | Resp 18 | Ht 63.0 in | Wt 123.4 lb

## 2017-05-18 DIAGNOSIS — Z87891 Personal history of nicotine dependence: Secondary | ICD-10-CM | POA: Diagnosis not present

## 2017-05-18 DIAGNOSIS — C3431 Malignant neoplasm of lower lobe, right bronchus or lung: Secondary | ICD-10-CM

## 2017-05-18 DIAGNOSIS — J029 Acute pharyngitis, unspecified: Secondary | ICD-10-CM | POA: Diagnosis not present

## 2017-05-18 DIAGNOSIS — G893 Neoplasm related pain (acute) (chronic): Secondary | ICD-10-CM

## 2017-05-18 DIAGNOSIS — K1231 Oral mucositis (ulcerative) due to antineoplastic therapy: Secondary | ICD-10-CM | POA: Diagnosis not present

## 2017-05-18 DIAGNOSIS — C7931 Secondary malignant neoplasm of brain: Secondary | ICD-10-CM | POA: Diagnosis not present

## 2017-05-18 LAB — BASIC METABOLIC PANEL
Anion gap: 11 (ref 5–15)
BUN: 10 mg/dL (ref 6–20)
CALCIUM: 9.2 mg/dL (ref 8.9–10.3)
CO2: 23 mmol/L (ref 22–32)
Chloride: 104 mmol/L (ref 101–111)
Creatinine, Ser: 0.62 mg/dL (ref 0.44–1.00)
GFR calc Af Amer: >60 mL/min (ref >60)
GFR calc non Af Amer: >60 mL/min (ref >60)
GLUCOSE: 124 mg/dL — AB (ref 65–99)
POTASSIUM: 4.1 mmol/L (ref 3.5–5.1)
Sodium: 138 mmol/L (ref 135–145)

## 2017-05-18 LAB — CBC WITH DIFFERENTIAL/PLATELET
BASOS ABS: 0.1 10*3/uL (ref 0–0.1)
BASOS PCT: 1 %
Eosinophils Absolute: 0 10*3/uL (ref 0–0.7)
Eosinophils Relative: 0 %
HEMATOCRIT: 32.8 % — AB (ref 35.0–47.0)
HEMOGLOBIN: 11.2 g/dL — AB (ref 12.0–16.0)
LYMPHS PCT: 34 %
Lymphs Abs: 2.2 10*3/uL (ref 1.0–3.6)
MCH: 38.3 pg — ABNORMAL HIGH (ref 26.0–34.0)
MCHC: 34.3 g/dL (ref 32.0–36.0)
MCV: 111.7 fL — AB (ref 80.0–100.0)
MONO ABS: 0.6 10*3/uL (ref 0.2–0.9)
Monocytes Relative: 9 %
Neutro Abs: 3.6 10*3/uL (ref 1.4–6.5)
Neutrophils Relative %: 56 %
Platelets: 280 10*3/uL (ref 150–440)
RBC: 2.94 MIL/uL — ABNORMAL LOW (ref 3.80–5.20)
RDW: 17.8 % — AB (ref 11.5–14.5)
WBC: 6.5 10*3/uL (ref 3.6–11.0)

## 2017-05-18 IMAGING — CT CT CHEST W/ CM
2 of 3 series · 14 of 36 positions shown, 17 images · IV contrast (iopamidol)
Comparison: 06/17/2015 PET-CT.  05/07/2015 chest CT.

CLINICAL DATA: Re- stage multifocal right lung adenocarcinoma
diagnosed on right lower lobe biopsy 02/10/2015, with ongoing
chemotherapy.

EXAM:
CT CHEST WITH CONTRAST
TECHNIQUE: Multidetector CT imaging of the chest was performed during
intravenous contrast administration.
CONTRAST:  75mL KMWPBP-AMM IOPAMIDOL (KMWPBP-AMM) INJECTION 61%

[Series 2: axial st · axial · 0.59mm/px · z∈[-524,-252]mm · 11 of 160 slices shown, 14 images]
[im 12/160  mediastinal]
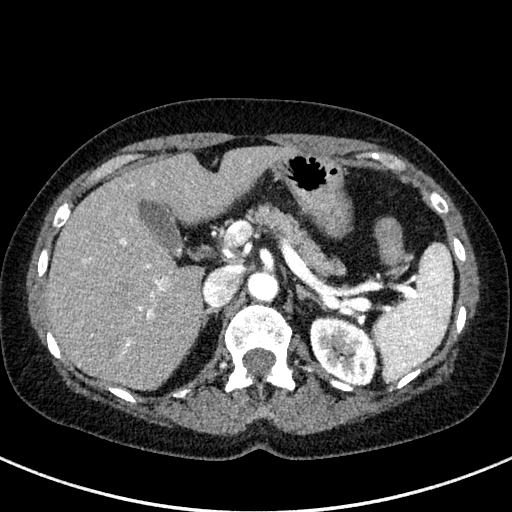
[im 12/160  lung]
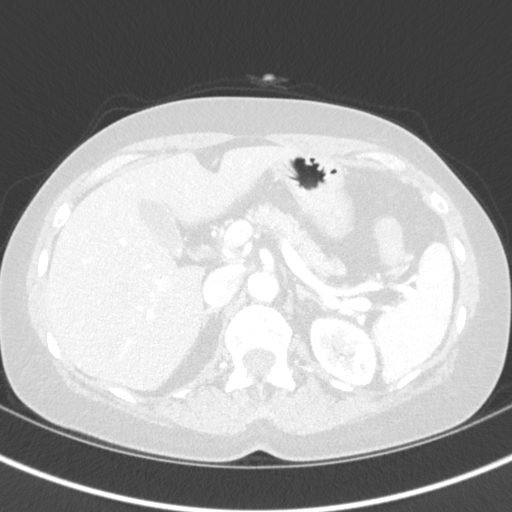
[im 24/160  lung]
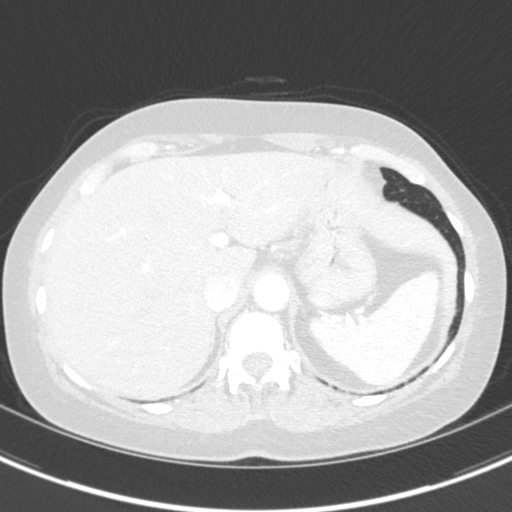
[im 36/160  lung]
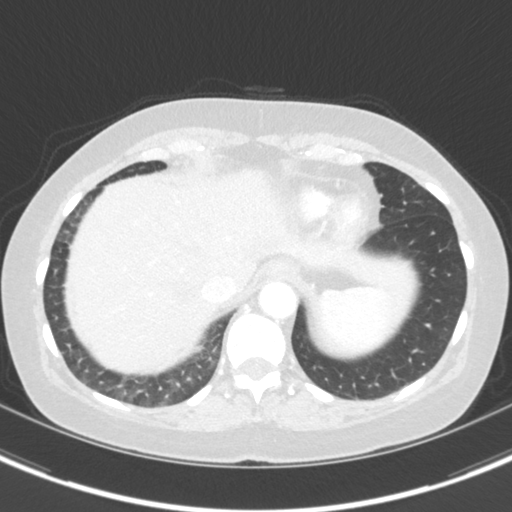
[im 54/160  lung]
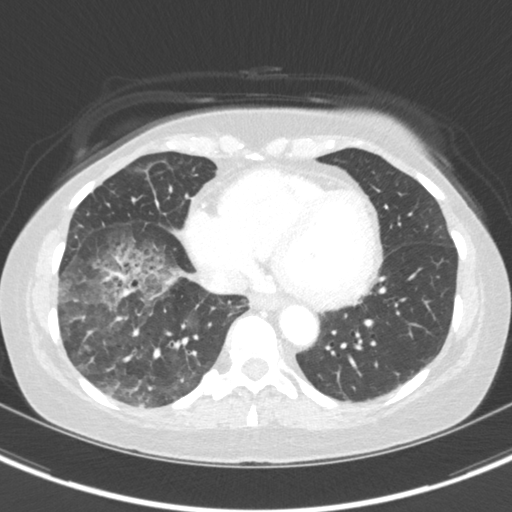
[im 65/160  mediastinal]
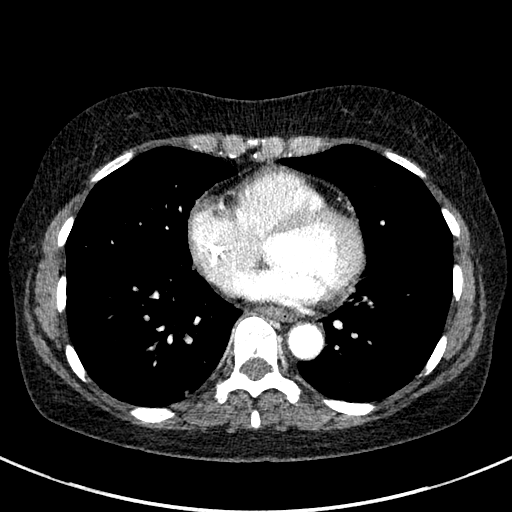
[im 65/160  lung]
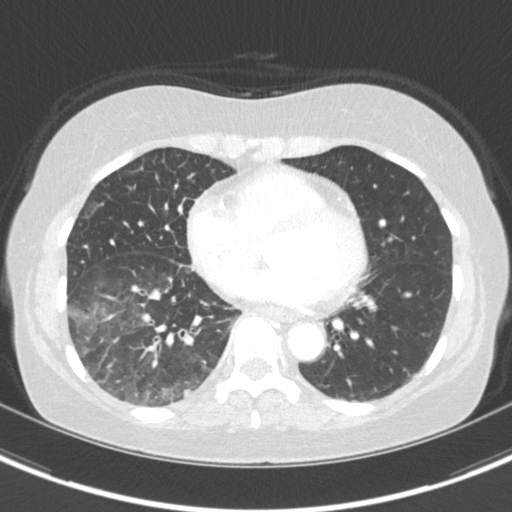
[im 83/160  lung]
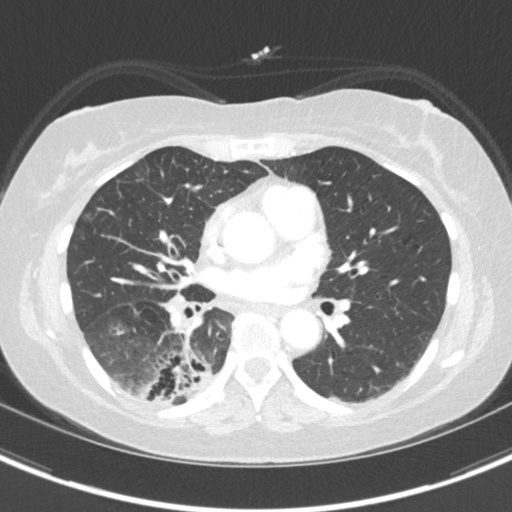
[im 95/160  lung]
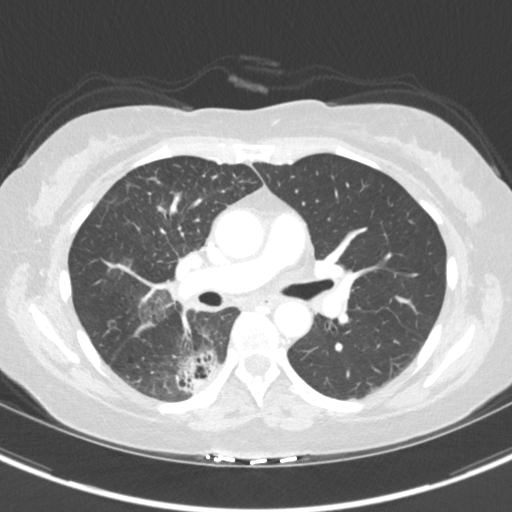
[im 107/160  lung]
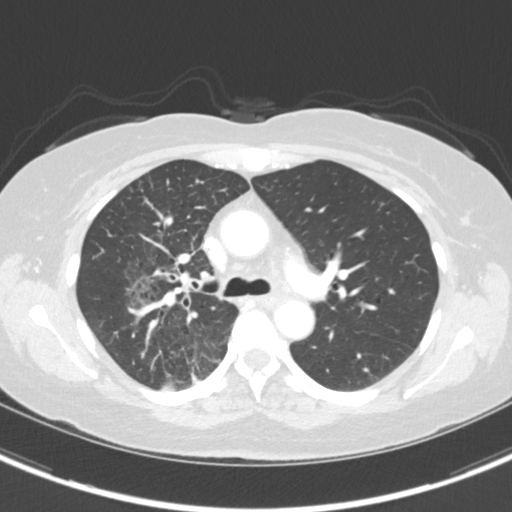
[im 124/160  mediastinal]
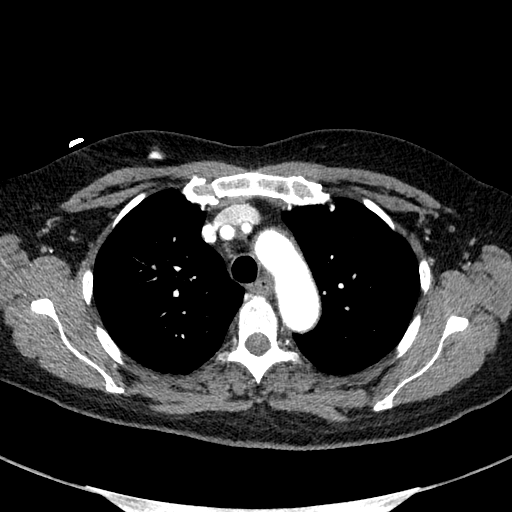
[im 124/160  lung]
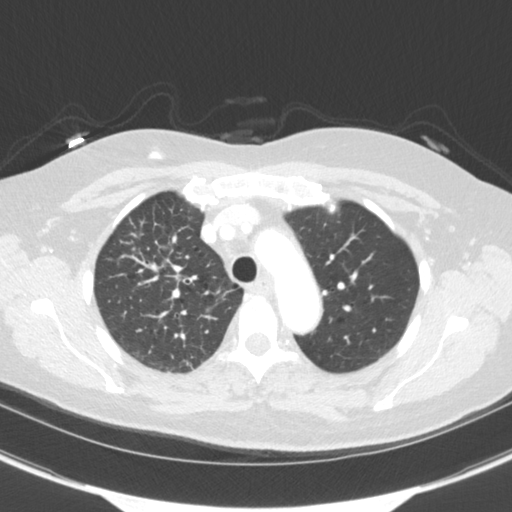
[im 136/160  lung]
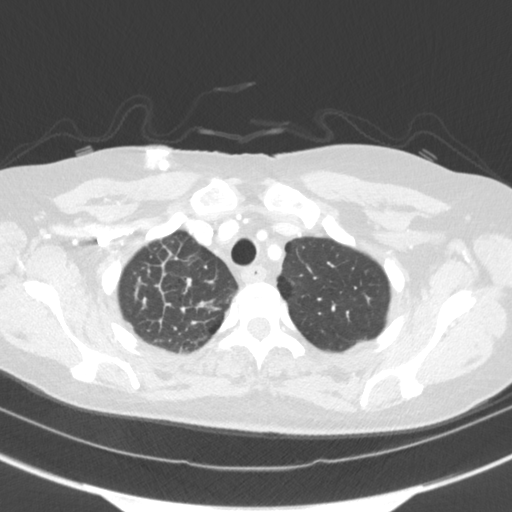
[im 148/160  lung]
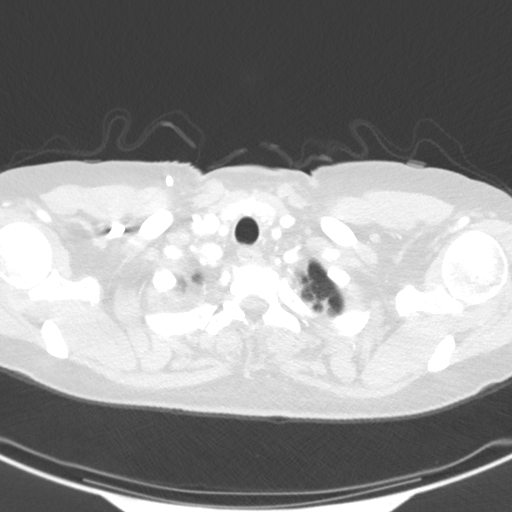

[Series 5: coronal · coronal · 0.57mm/px · 3 of 103 slices shown]
[im 21/103  lung]
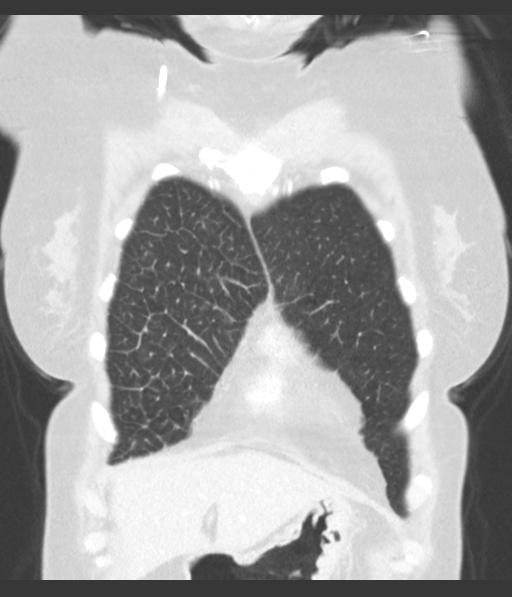
[im 41/103  lung]
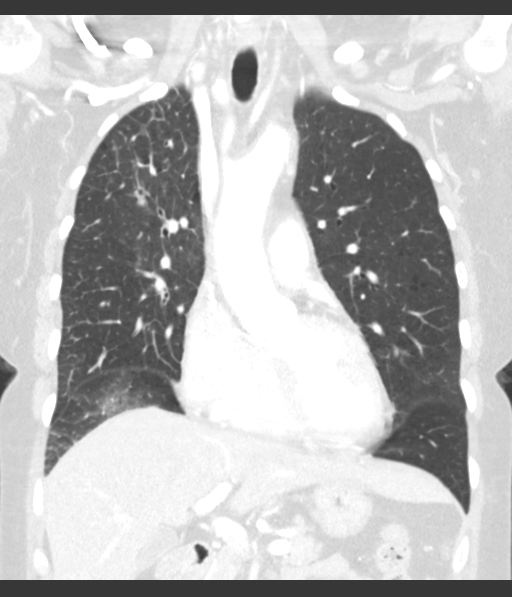
[im 62/103  lung]
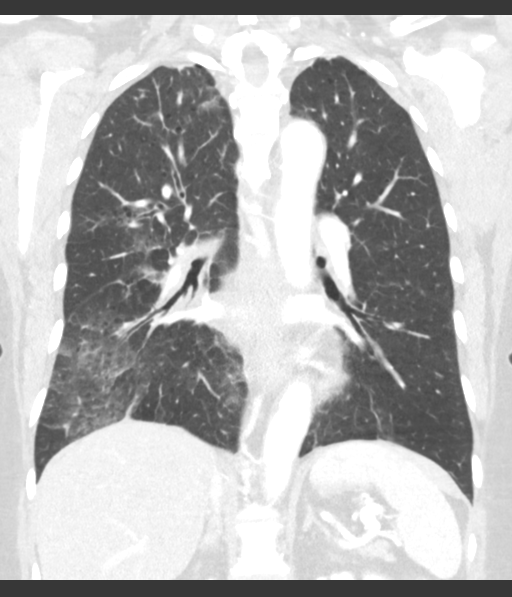

[14 of 36 positions shown; findings below may reference images not displayed]

FINDINGS: Cardiovascular: Normal heart size. No significant pericardial
fluid/thickening. Right internal jugular MediPort terminates near
the cavoatrial junction. Atherosclerotic nonaneurysmal thoracic
aorta. Normal caliber pulmonary arteries. No central pulmonary
emboli.

Mediastinum/Nodes: No discrete thyroid nodules. Unremarkable
esophagus. No axillary adenopathy. Mildly enlarged 1.0 cm AP window
node (series 2/ image 51), previously 1.0 cm on 06/17/2015 using
similar measurement technique, unchanged. Previously described
mildly hypermetabolic 0.8 cm right hilar node is stable (series 2/
image 61). No additional pathologically enlarged mediastinal or
hilar nodes.

Lungs/Pleura: No pneumothorax. No pleural effusion. Mild
centrilobular and paraseptal emphysema. No acute consolidative
airspace disease or new significant pulmonary nodules. There are
multifocal pulmonary nodules in the right lung as follows:

- basilar right upper lobe ground-glass 2.5 x 1.6 cm pulmonary
nodule (series 3/image 72), previously 2.6 x 1.5 cm on 06/17/2015,
not appreciably changed

- right upper lobe 4.8 x 2.9 cm ground-glass lung mass (series
3/image 62), previously 4.8 x 2.9 cm on 06/17/2015 using similar
measurement technique, not appreciably changed

- subsolid posterior right lower lobe 5.8 x 3.3 cm cavitary lung
mass (series 3/image 82), previously 5.9 x 3.4 cm on 06/17/2015
using similar measurement technique, not appreciably changed in
size, with increased internal cavitation

- basilar right lower lobe 5.2 x 4.6 cm sub solid lung mass (series
3/image 110), previously 5.1 x 4.8 cm using similar measurement
technique, not appreciably changed

Patchy interlobular septal thickening and additional patchy small
regions of ground-glass attenuation throughout the right lung have
not appreciably changed.

Upper abdomen: Unremarkable.

Musculoskeletal: No aggressive appearing focal osseous lesions. Mild
thoracic spondylosis.
IMPRESSION: 1. Multifocal right lung adenocarcinoma is stable in size and
extent. Increased cavitation within the dominant subsolid right
lower lobe lung mass likely represents treatment effect.
2. Mild AP window and right hilar adenopathy is stable.
3. No new or progressive metastatic disease in the chest.
4. Additional findings include aortic atherosclerosis and mild
emphysema.

## 2017-05-18 MED ORDER — OXYCODONE HCL 10 MG PO TABS: 10.0000 mg | ORAL_TABLET | Freq: Four times a day (QID) | ORAL | 0 refills | Status: DC | PRN

## 2017-05-18 NOTE — Progress Notes (Signed)
Pt states she feels good today, when she swallows it is a little sore but her mouth is cleared up. Eating and drinking good

## 2017-05-18 NOTE — Progress Notes (Signed)
Sympt[facility] [place]  [phone] [phone]  Patient Care Team: Rubye Beach as PCP - General (Family Medicine) Allyne Gee, MD as Referring Physician (Internal Medicine)   Name of the patient: Deanna Blair  [id number]  12-16-1955   Date of visit: 05/18/17  Diagnosis-malignant neoplasm of trachea, bronchus, and lung (right lower lobe)-stage IIIb  Chief complaint/ Reason for visit- Mucositis  Heme/Onc history: Patient last evaluated by primary oncologist, Dr. Rogue Bussing, on 05/04/2017, for assessment prior to cycle 3 of Taxotere-Ram. Primary cancer of right lower lobe of lung (T4, N2, M1) adenocarcinoma of the lung, currently on Taxotere-Cyramza s/p cycle 3.  She was seen at [facility] in April 2019.  Lung disease showing improvement (consolidation); April 2019 MRI brain shows-2 to 3 mm lesion in the brain is stable.  She has had poor tolerance to standard chemotherapy but good clinical benefit/partial response.  Per Dr. Rogue Bussing, Taxotere-Ram for chemotherapy, with decreased dose of Taxotere to 60 mg/m2.  Growth factor support discontinued due to body aches affecting quality of life.  Recommendation from [facility] included spacing of chemotherapy to every 4 weeks.  She has a history of brain metastases and is s/p GK at [facility]. New 2 mm lesion previously buted but stable on MRI 04/19/17 at [facility].    Interval history - patient presents to symptom management clinic for evaluation of labs and follow-up after recent chemotherapy. Previously patient suffered oral mucositis secondary to chemotherapy significantly interfering quality of life, inflicting pain, and compromising nutrition.  She denies any mouth sores today.  She states that she does not think Magic mouthwash helped her symptoms significantly.  Continuing baking soda rinses.  Has mild throat.  She continues fentanyl 25 mcg patch and oxycodone 10 mg oral  tablet.  She states due to recent recall of fentanyl patches, she was unable to obtain medication and had to use additional oxycodone.  She has now obtained her patches and is requesting refill of oxycodone today.  She states pain is well controlled current medication regimen.  Denies opiate-induced constipation.  She states that skin rash/boil has resolved.  Today she reports feeling well she says she has been eating drinking well . And denies any other specific complaints.  She has recently returned from a beach trip and says this is significantly improved her overall mood.  ECOG FS:0 - Asymptomatic  Review of systems- Review of Systems  Constitutional: Negative for chills, fever, malaise/fatigue and weight loss.  HENT: Positive for sore throat (mild). Negative for congestion, ear discharge, ear pain, sinus pain and tinnitus.   Eyes: Negative.   Respiratory: Negative.  Negative for cough, sputum production and shortness of breath.   Cardiovascular: Negative for chest pain, palpitations, orthopnea, claudication and leg swelling.  Gastrointestinal: Negative for abdominal pain, blood in stool, constipation, diarrhea, heartburn, nausea and vomiting.  Genitourinary: Negative.   Musculoskeletal: Negative.   Skin: Negative.   Neurological: Negative for dizziness, tingling, weakness and headaches.  Endo/Heme/Allergies: Negative.   Psychiatric/Behavioral: Negative.      Current treatment- s/p cycle 3 Taxotere-Cyramza on 05/04/17  No Known Allergies   Past Medical History:  Diagnosis Date  . Anxiety   . Cancer of right lung ([place]) 02/13/2015, 01/13/17  . Depression   . Depression with anxiety    Well controlled with Wellbutrin and Buspar  . Pneumonia     Past Surgical History:  Procedure Laterality Date  . ECTOPIC PREGNANCY SURGERY  1988  .  ELECTROMAGNETIC NAVIGATION BROCHOSCOPY N/A 02/10/2015   Procedure: ELECTROMAGNETIC NAVIGATION BRONCHOSCOPY;  Surgeon: Flora Lipps, MD;  Location: [facility] ORS;   Service: Cardiopulmonary;  Laterality: N/A;  . ENDOBRONCHIAL ULTRASOUND N/A 02/10/2015   Procedure: ENDOBRONCHIAL ULTRASOUND;  Surgeon: Flora Lipps, MD;  Location: [facility] ORS;  Service: Cardiopulmonary;  Laterality: N/A;  . PERIPHERAL VASCULAR CATHETERIZATION N/A 03/05/2015   Procedure: Glori Luis Cath Insertion;  Surgeon: Katha Cabal, MD;  Location: [facility];  Service: Cardiovascular;  Laterality: N/A;    Social History   Socioeconomic History  . Marital status: Married    Spouse name: Herbie Baltimore  . Number of children: 1  . Years of education: 68  . Highest education level: Not on file  Occupational History  . 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 Financial resource strain: Not on file  . Food insecurity:    Worry: Not on file    Inability: Not on file  . Transportation needs:    Medical: Not on file    Non-medical: Not on file  Tobacco Use  . Smoking status: Former Smoker    Packs/day: 0.25    Years: 30.00    Pack years: 7.50    Types: Cigarettes    Last attempt to quit: 02/06/2005    Years since quitting: 12.2  . Smokeless tobacco: Never Used  Substance and Sexual Activity  . Alcohol use: Yes    Comment: not drank any in several months  . Drug use: No  . Sexual activity: Yes    Partners: Male  Lifestyle  . Physical activity:    Days per week: Not on file    Minutes per session: Not on file  . Stress: Not on file  Relationships  . Social connections:    Talks on phone: Not on file    Gets together: Not on file    Attends religious service: Not on file    Active member of club or organization: Not on file    Attends meetings of clubs or organizations: Not on file    Relationship status: Not on file  . Intimate partner violence:    Fear of current or ex partner: Not on file    Emotionally abused: Not on file    Physically abused: Not on file    Forced sexual activity: Not on file  Other Topics Concern  . Not  on file  Social History Narrative   [name] was born in [place], [place]. She moved with her family with [facility] and lived there for 13 years. She recently moved to [place] approximately 1 year ago. She lives with her husband of 28 years. They have an adult daughter who lives in [place]. 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 She is enjoying driving around and getting to know [place]. She and her husband enjoy cooking and baking. They also enjoy hiking when the weather permits.    Family History  Problem Relation Age of Onset  . Cancer Father        Prostate Cancer  . Hypertension Father   . Stroke Father   . Hypertension Brother     Current Outpatient Medications:  .  acetaminophen (TYLENOL) 500 MG tablet, Take 1,000 mg by mouth every 4 (four) hours as needed. , Disp: , Rfl:  .  dexamethasone (DECADRON) 4 MG tablet, Take 1 tablet (4 mg total) by mouth 2 (two) times daily with  a meal. Start the day prior to chemo; for 3 days., Disp: 60 tablet, Rfl: 3 .  fentaNYL (DURAGESIC - DOSED MCG/HR) 25 MCG/HR patch, Place 1 patch (25 mcg total) onto the skin every 3 (three) days., Disp: 10 patch, Rfl: 0 .  ibuprofen (IBU) 400 MG tablet, Take 1 tablet (400 mg total) by mouth 3 (three) times daily as needed., Disp: 30 tablet, Rfl: 0 .  lidocaine-prilocaine (EMLA) cream, Apply 1 application topically as needed., Disp: 30 g, Rfl: 6 .  Oxycodone HCl 10 MG TABS, Take 1 tablet (10 mg total) by mouth every 6 (six) hours as needed (moderate to severe pain)., Disp: 60 tablet, Rfl: 0 .  polyethylene glycol (MIRALAX / GLYCOLAX) packet, Take 17 g by mouth daily as needed., Disp: , Rfl:  .  zolpidem (AMBIEN) 5 MG tablet, TAKE 1 TABLET BY MOUTH AT BEDTIME AS NEEDED FOR SLEEP, Disp: 30 tablet, Rfl: 2 .  amoxicillin-clavulanate (AUGMENTIN) 875-125 MG tablet, Take 1 tablet by mouth 2 (two) times daily. (Patient not taking: Reported on 05/04/2017), Disp: 14 tablet, Rfl: 0 .  docusate sodium  (COLACE) 100 MG capsule, Take 100 mg by mouth 2 (two) times daily., Disp: , Rfl:  .  Fluticasone-Umeclidin-Vilant (TRELEGY ELLIPTA) 100-62.5-25 MCG/INH AEPB, Inhale 1 puff into the lungs daily., Disp: 1 each, Rfl: 4 .  magic mouthwash w/lidocaine SOLN, Take 5 mLs by mouth 4 (four) times daily., Disp: 480 mL, Rfl: 3 .  ondansetron (ZOFRAN) 8 MG tablet, Take 1 tablet (8 mg total) by mouth every 8 (eight) hours as needed for nausea or vomiting (start 3 days; after chemo). (Patient not taking: Reported on 05/18/2017), Disp: 40 tablet, Rfl: 1 .  prochlorperazine (COMPAZINE) 10 MG tablet, Take 1 tablet (10 mg total) by mouth every 6 (six) hours as needed for nausea or vomiting. (Patient not taking: Reported on 05/18/2017), Disp: 25 tablet, Rfl: 1  Physical exam:  Vitals:   05/18/17 0956  BP: 123/78  Pulse: 69  Resp: 18  Temp: (!) 97.4 F (36.3 C)  TempSrc: Oral  Weight: 123 lb 6.4 oz (56 kg)  Height: 5\' 3"  (1.6 m)   Physical Exam  Constitutional: She is oriented to person, place, and time. She appears well-developed and well-nourished.  Unaccompanied  HENT:  Head: Atraumatic.  Nose: Nose normal.  Mouth/Throat: Oropharynx is clear and moist. No oropharyngeal exudate.  alopecia  Eyes: Conjunctivae are normal. No scleral icterus.  Neck: Normal range of motion.  Cardiovascular: Normal rate, regular rhythm and normal heart sounds.  Pulmonary/Chest: Effort normal and breath sounds normal.  Abdominal: Soft. Bowel sounds are normal.  Musculoskeletal: She exhibits no edema.  Neurological: She is alert and oriented to person, place, and time.  Skin: Skin is warm and dry.  Psychiatric: She has a normal mood and affect.     CMP Latest Ref Rng & Units 05/18/2017  Glucose 65 - 99 mg/dL 124(H)  BUN 6 - 20 mg/dL 10  Creatinine 0.44 - 1.00 mg/dL 0.62  Sodium 135 - 145 mmol/L 138  Potassium 3.5 - 5.1 mmol/L 4.1  Chloride 101 - 111 mmol/L 104  CO2 22 - 32 mmol/L 23  Calcium 8.9 - 10.3 mg/dL 9.2    Total Protein 6.5 - 8.1 g/dL -  Total Bilirubin 0.3 - 1.2 mg/dL -  Alkaline Phos 38 - 126 U/L -  AST 15 - 41 U/L -  ALT 14 - 54 U/L -   CBC Latest Ref Rng & Units 05/18/2017  WBC  3.6 - 11.0 K/uL 6.5  Hemoglobin 12.0 - 16.0 g/dL 11.2(L)  Hematocrit 35.0 - 47.0 % 32.8(L)  Platelets 150 - 440 K/uL 280    No images are attached to the encounter.  No results found.   Assessment and plan- Patient is a 62 y.o. female with adenocarcinoma of right lung who presents to [facility] for repeat labs and follow-up.   1.  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 Growth factor currently held d/t intolerance.   2. Chemotherapy induced mucositis- stable/resolved. Mild sore throat. Continue salt and backing soda rinses.   3. Pain due to malignancy- continue fentanyl 25 mcg patch q72h and oxycodone 10mg  q6h prn for breakthrough. Refill for oxycodone provided today.   4. Convalescence following chemotherapy- tolerating current chemotherapy regimen significantly better. She declines fluids today. Blood work reassuring. Vitals stable. Can call clinic if she needs visit sooner.   RTC in 2 weeks for labs and consideration of cycle 4 of Taxotere-Cyramza chemotherapy with Dr. Rogue Bussing    Visit Diagnosis 1. Mucositis due to chemotherapy   2. Primary cancer of right lower lobe of lung ([place])   3. Cancer associated pain     Patient expressed understanding and was in agreement with this plan. She also understands that She can call clinic at any time with any questions, concerns, or complaints.   Thank you for allowing me to participate in the care of this very pleasant patient.   Beckey Rutter, DNP, AGNP-C [place] at [facility] [phone] (work cell) [phone] (office) 05/18/17 10:08 AM

## 2017-05-19 ENCOUNTER — Ambulatory Visit (INDEPENDENT_AMBULATORY_CARE_PROVIDER_SITE_OTHER): Payer: BLUE CROSS/BLUE SHIELD | Admitting: Internal Medicine

## 2017-05-19 ENCOUNTER — Encounter: Payer: Self-pay | Admitting: Internal Medicine

## 2017-05-19 VITALS — BP 98/70 | HR 89 | Resp 16 | Ht 63.0 in | Wt 120.6 lb

## 2017-05-19 DIAGNOSIS — R0602 Shortness of breath: Secondary | ICD-10-CM

## 2017-05-19 DIAGNOSIS — G894 Chronic pain syndrome: Secondary | ICD-10-CM

## 2017-05-19 DIAGNOSIS — J449 Chronic obstructive pulmonary disease, unspecified: Secondary | ICD-10-CM

## 2017-05-19 DIAGNOSIS — C3490 Malignant neoplasm of unspecified part of unspecified bronchus or lung: Secondary | ICD-10-CM | POA: Diagnosis not present

## 2017-05-19 NOTE — Patient Instructions (Signed)

## 2017-05-19 NOTE — Progress Notes (Signed)
[facility] Deanna Blair, Deanna Blair [address]  Pulmonary Sleep Medicine   Office Visit Note  Patient Name: Deanna Blair DOB: 1955-01-06 MRN [id number]  Date of Service: 05/19/2017  Complaints/HPI:  Doing very well she has followed up with Oncology and is supposed to restart her chemotherapy after a break.  They are going to increase the gap between rounds of chemotherapy.  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  Clinically she is doing well she is in good spirits.  She is had no cough no congestion denies having any hemoptysis.  No chest pain at this time.  States that she gets a little bit of short of breath when she exerts herself.  ROS  General: (-) fever, (-) chills, (-) night sweats, (-) weakness Skin: (-) rashes, (-) itching,. Eyes: (-) visual changes, (-) redness, (-) itching. Nose and Sinuses: (-) nasal stuffiness or itchiness, (-) postnasal drip, (-) nosebleeds, (-) sinus trouble. Mouth and Throat: (-) sore throat, (-) hoarseness. Neck: (-) swollen glands, (-) enlarged thyroid, (-) neck pain. Respiratory: - cough, (-) bloody sputum, + shortness of breath, + wheezing. Cardiovascular: - ankle swelling, (-) chest pain. Lymphatic: (-) lymph node enlargement. Neurologic: (-) numbness, (-) tingling. Psychiatric: (-) anxiety, (-) depression   Current Medication: Outpatient Encounter Medications as of 05/19/2017  Medication Sig  . acetaminophen (TYLENOL) 500 MG tablet Take 1,000 mg by mouth every 4 (four) hours as needed.   Deanna Blair amoxicillin-clavulanate (AUGMENTIN) 875-125 MG tablet Take 1 tablet by mouth 2 (two) times daily. (Patient not taking: Reported on 05/04/2017)  . dexamethasone (DECADRON) 4 MG tablet Take 1 tablet (4 mg total) by mouth 2 (two) times daily with a meal. Start the day prior to chemo; for 3 days.  Deanna Blair docusate sodium (COLACE) 100 MG capsule Take  100 mg by mouth 2 (two) times daily.  . fentaNYL (DURAGESIC - DOSED MCG/HR) 25 MCG/HR patch Place 1 patch (25 mcg total) onto the skin every 3 (three) days.  . Fluticasone-Umeclidin-Vilant (TRELEGY ELLIPTA) 100-62.5-25 MCG/INH AEPB Inhale 1 puff into the lungs daily.  Deanna Blair ibuprofen (IBU) 400 MG tablet Take 1 tablet (400 mg total) by mouth 3 (three) times daily as needed.  . lidocaine-prilocaine (EMLA) cream Apply 1 application topically as needed.  . magic mouthwash w/lidocaine SOLN Take 5 mLs by mouth 4 (four) times daily.  . ondansetron (ZOFRAN) 8 MG tablet Take 1 tablet (8 mg total) by mouth every 8 (eight) hours as needed for nausea or vomiting (start 3 days; after chemo). (Patient not taking: Reported on 05/18/2017)  . Oxycodone HCl 10 MG TABS Take 1 tablet (10 mg total) by mouth every 6 (six) hours as needed (moderate to severe pain).  . polyethylene glycol (MIRALAX / GLYCOLAX) packet Take 17 g by mouth daily as needed.  . prochlorperazine (COMPAZINE) 10 MG tablet Take 1 tablet (10 mg total) by mouth every 6 (six) hours as needed for nausea or vomiting. (Patient not taking: Reported on 05/18/2017)  . zolpidem (AMBIEN) 5 MG tablet TAKE 1 TABLET BY MOUTH AT BEDTIME AS NEEDED FOR SLEEP   No facility-administered encounter medications on file as of 05/19/2017.     Surgical History: Past Surgical History:  Procedure Laterality Date  . ECTOPIC PREGNANCY SURGERY  1988  . ELECTROMAGNETIC NAVIGATION BROCHOSCOPY N/A 02/10/2015   Procedure: ELECTROMAGNETIC NAVIGATION BRONCHOSCOPY;  Surgeon: Flora Lipps, MD;  Location: [facility] ORS;  Service: Cardiopulmonary;  Laterality: N/A;  . ENDOBRONCHIAL ULTRASOUND N/A 02/10/2015   Procedure: ENDOBRONCHIAL ULTRASOUND;  Surgeon: Flora Lipps, MD;  Location: [facility] ORS;  Service: Cardiopulmonary;  Laterality: N/A;  . PERIPHERAL VASCULAR CATHETERIZATION N/A 03/05/2015   Procedure: Glori Luis Cath Insertion;  Surgeon: Katha Cabal, MD;  Location: [facility];  Service:  Cardiovascular;  Laterality: N/A;    Medical History: Past Medical History:  Diagnosis Date  . Anxiety   . Cancer of right lung ([place]) 02/13/2015, 01/13/17  . Depression   . Depression with anxiety    Well controlled with Wellbutrin and Buspar  . Pneumonia     Family History: Family History  Problem Relation Age of Onset  . Cancer Father        Prostate Cancer  . Hypertension Father   . Stroke Father   . Hypertension Brother     Social History: Social History   Socioeconomic History  . Marital status: Married    Spouse name: Herbie Baltimore  . Number of children: 1  . Years of education: 40  . Highest education level: Not on file  Occupational History  . 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 Financial resource strain: Not on file  . Food insecurity:    Worry: Not on file    Inability: Not on file  . Transportation needs:    Medical: Not on file    Non-medical: Not on file  Tobacco Use  . Smoking status: Former Smoker    Packs/day: 0.25    Years: 30.00    Pack years: 7.50    Types: Cigarettes    Last attempt to quit: 02/06/2005    Years since quitting: 12.2  . Smokeless tobacco: Never Used  Substance and Sexual Activity  . Alcohol use: Yes    Comment: not drank any in several months  . Drug use: No  . Sexual activity: Yes    Partners: Male  Lifestyle  . Physical activity:    Days per week: Not on file    Minutes per session: Not on file  . Stress: Not on file  Relationships  . Social connections:    Talks on phone: Not on file    Gets together: Not on file    Attends religious service: Not on file    Active member of club or organization: Not on file    Attends meetings of clubs or organizations: Not on file    Relationship status: Not on file  . Intimate partner violence:    Fear of current or ex partner: Not on file    Emotionally abused: Not on file    Physically abused: Not on file    Forced sexual  activity: Not on file  Other Topics Concern  . Not on file  Social History Narrative   Deanna Blair was born in [place], [place]. She moved with her family with [facility] and lived there for 13 years. She recently moved to [place] approximately 1 year ago. She lives with her husband of 28 years. They have an adult daughter who lives in [place]. 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 She is enjoying driving around and getting to know [place]. She and her husband enjoy cooking and baking. They also enjoy hiking when the weather permits.    Vital Signs: Blood pressure 98/70, pulse 89, resp. rate 16, height _0  (1.6 m), weight 120 lb  9.6 oz (54.7 kg), last menstrual period 06/16/2000, SpO2 95 %.  Examination: General Appearance: The patient is well-developed, well-nourished, and in no distress. Skin: Gross inspection of skin unremarkable. Head: normocephalic, no gross deformities. Eyes: no gross deformities noted. ENT: ears appear grossly normal no exudates. Neck: Supple. No thyromegaly. No LAD. Respiratory: no rhonchi noted. Cardiovascular: Normal S1 and S2 without murmur or rub. Extremities: No cyanosis. pulses are equal. Neurologic: Alert and oriented. No involuntary movements.  LABS: Recent Results (from the past 2160 hour(s))  Comprehensive metabolic panel     Status: Abnormal   Collection Time: 02/28/17  8:59 AM  Result Value Ref Range   Sodium 138 135 - 145 mmol/L   Potassium 3.3 (L) 3.5 - 5.1 mmol/L   Chloride 107 101 - 111 mmol/L   CO2 24 22 - 32 mmol/L   Glucose, Bld 100 (H) 65 - 99 mg/dL   BUN 24 (H) 6 - 20 mg/dL   Creatinine, Ser 0.64 0.44 - 1.00 mg/dL   Calcium 8.7 (L) 8.9 - 10.3 mg/dL   Total Protein 6.3 (L) 6.5 - 8.1 g/dL   Albumin 3.5 3.5 - 5.0 g/dL   AST 27 15 - 41 U/L   ALT 18 14 - 54 U/L   Alkaline Phosphatase 145 (H) 38 - 126 U/L   Total Bilirubin 0.4 0.3 - 1.2 mg/dL   GFR calc non Af Amer >60 >60 mL/min   GFR calc Af Amer >60 >60  mL/min    Comment: (NOTE) The eGFR has been calculated using the CKD EPI equation. This calculation has not been validated in all clinical situations. eGFR's persistently <60 mL/min signify possible Chronic Kidney Disease.    Anion gap 7 5 - 15    Comment: Performed at [facility], [address] [place], [place] [address]  CBC with Differential/Platelet     Status: Abnormal   Collection Time: 02/28/17  8:59 AM  Result Value Ref Range   WBC 23.6 (H) 3.6 - 11.0 K/uL   RBC 3.32 (L) 3.80 - 5.20 MIL/uL   Hemoglobin 12.3 12.0 - 16.0 g/dL   HCT 35.0 35.0 - 47.0 %   MCV 105.2 (H) 80.0 - 100.0 fL   MCH 37.2 (H) 26.0 - 34.0 pg   MCHC 35.3 32.0 - 36.0 g/dL   RDW 13.3 11.5 - 14.5 %   Platelets 129 (L) 150 - 440 K/uL   Neutrophils Relative % 81 %   Neutro Abs 19.4 (H) 1.4 - 6.5 K/uL   Lymphocytes Relative 13 %   Lymphs Abs 3.0 1.0 - 3.6 K/uL   Monocytes Relative 5 %   Monocytes Absolute 1.1 (H) 0.2 - 0.9 K/uL   Eosinophils Relative 0 %   Eosinophils Absolute 0.0 0 - 0.7 K/uL   Basophils Relative 1 %   Basophils Absolute 0.1 0 - 0.1 K/uL    Comment: Performed at [facility], [address] [place], [place] [address]  Hold Tube- Blood Bank     Status: None   Collection Time: 02/28/17  9:30 AM  Result Value Ref Range   Blood Bank Specimen SAMPLE AVAILABLE FOR TESTING    Sample Expiration      03/03/2017 Performed at [facility], [address] [place], [place] [address]   Urinalysis, Complete w Microscopic     Status: Abnormal   Collection Time: 03/09/17  8:48 AM  Result Value Ref Range   Color, Urine YELLOW (A) YELLOW   APPearance CLEAR (A)  CLEAR   Specific Gravity, Urine 1.024 1.005 - 1.030   pH 6.0 5.0 - 8.0   Glucose, UA NEGATIVE NEGATIVE mg/dL   Hgb urine dipstick NEGATIVE NEGATIVE   Bilirubin Urine NEGATIVE NEGATIVE   Ketones, ur NEGATIVE NEGATIVE mg/dL   Protein, ur 30 (A) NEGATIVE mg/dL   Nitrite NEGATIVE NEGATIVE   Leukocytes, UA NEGATIVE  NEGATIVE   RBC / HPF 0-5 0 - 5 RBC/hpf   WBC, UA 6-30 0 - 5 WBC/hpf   Bacteria, UA RARE (A) NONE SEEN   Squamous Epithelial / LPF 0-5 (A) NONE SEEN   Mucus PRESENT     Comment: Performed at [facility], [address] [place], [place] [address]  Comprehensive metabolic panel     Status: Abnormal   Collection Time: 03/09/17  8:58 AM  Result Value Ref Range   Sodium 132 (L) 135 - 145 mmol/L   Potassium 4.2 3.5 - 5.1 mmol/L   Chloride 100 (L) 101 - 111 mmol/L   CO2 23 22 - 32 mmol/L   Glucose, Bld 108 (H) 65 - 99 mg/dL   BUN 33 (H) 6 - 20 mg/dL   Creatinine, Ser 0.54 0.44 - 1.00 mg/dL   Calcium 9.1 8.9 - 10.3 mg/dL   Total Protein 6.7 6.5 - 8.1 g/dL   Albumin 3.8 3.5 - 5.0 g/dL   AST 28 15 - 41 U/L   ALT 22 14 - 54 U/L   Alkaline Phosphatase 67 38 - 126 U/L   Total Bilirubin 0.6 0.3 - 1.2 mg/dL   GFR calc non Af Amer >60 >60 mL/min   GFR calc Af Amer >60 >60 mL/min    Comment: (NOTE) The eGFR has been calculated using the CKD EPI equation. This calculation has not been validated in all clinical situations. eGFR's persistently <60 mL/min signify possible Chronic Kidney Disease.    Anion gap 9 5 - 15    Comment: Performed at [facility], [address] [place], [place] [address]  CBC with Differential/Platelet     Status: Abnormal   Collection Time: 03/09/17  8:58 AM  Result Value Ref Range   WBC 16.9 (H) 3.6 - 11.0 K/uL   RBC 3.56 (L) 3.80 - 5.20 MIL/uL   Hemoglobin 13.1 12.0 - 16.0 g/dL   HCT 37.7 35.0 - 47.0 %   MCV 106.1 (H) 80.0 - 100.0 fL   MCH 36.9 (H) 26.0 - 34.0 pg   MCHC 34.8 32.0 - 36.0 g/dL   RDW 13.6 11.5 - 14.5 %   Platelets 268 150 - 440 K/uL   Neutrophils Relative % 85 %   Neutro Abs 14.5 (H) 1.4 - 6.5 K/uL   Lymphocytes Relative 8 %   Lymphs Abs 1.3 1.0 - 3.6 K/uL   Monocytes Relative 7 %   Monocytes Absolute 1.1 (H) 0.2 - 0.9 K/uL   Eosinophils Relative 0 %   Eosinophils Absolute 0.0 0 - 0.7 K/uL   Basophils Relative 0 %    Basophils Absolute 0.1 0 - 0.1 K/uL    Comment: Performed at [facility], [address], [place], [place] [address]  Magnesium     Status: None   Collection Time: 03/16/17 11:36 AM  Result Value Ref Range   Magnesium 2.1 1.7 - 2.4 mg/dL    Comment: Performed at [facility], [address], [place], [place] [address]  Comprehensive metabolic panel     Status: Abnormal   Collection Time: 03/16/17 11:36 AM  Result Value Ref Range   Sodium 137 135 - 145 mmol/L   Potassium 4.1 3.5 - 5.1 mmol/L   Chloride 102 101 - 111 mmol/L   CO2 21 (L) 22 - 32 mmol/L   Glucose, Bld 110 (H) 65 - 99 mg/dL   BUN 37 (H) 6 - 20 mg/dL   Creatinine, Ser 0.70 0.44 - 1.00 mg/dL   Calcium 8.8 (L) 8.9 - 10.3 mg/dL   Total Protein 6.7 6.5 - 8.1 g/dL   Albumin 3.7 3.5 - 5.0 g/dL   AST 30 15 - 41 U/L   ALT 22 14 - 54 U/L   Alkaline Phosphatase 84 38 - 126 U/L   Total Bilirubin 0.7 0.3 - 1.2 mg/dL   GFR calc non Af Amer >60 >60 mL/min   GFR calc Af Amer >60 >60 mL/min    Comment: (NOTE) The eGFR has been calculated using the CKD EPI equation. This calculation has not been validated in all clinical situations. eGFR's persistently <60 mL/min signify possible Chronic Kidney Disease.    Anion gap 14 5 - 15    Comment: Performed at [facility], [address] [place], [place] [address]  CBC with Differential/Platelet     Status: Abnormal   Collection Time: 03/16/17 11:36 AM  Result Value Ref Range   WBC 10.2 3.6 - 11.0 K/uL   RBC 3.51 (L) 3.80 - 5.20 MIL/uL   Hemoglobin 12.9 12.0 - 16.0 g/dL   HCT 37.0 35.0 - 47.0 %   MCV 105.3 (H) 80.0 - 100.0 fL   MCH 36.8 (H) 26.0 - 34.0 pg   MCHC 34.9 32.0 - 36.0 g/dL   RDW 13.8 11.5 - 14.5 %   Platelets 111 (L) 150 - 440 K/uL   Neutrophils Relative % 89 %   Neutro Abs 9.0 (H) 1.4 - 6.5 K/uL   Lymphocytes Relative 8 %   Lymphs Abs 0.8 (L) 1.0 - 3.6 K/uL   Monocytes Relative 3 %   Monocytes Absolute 0.3 0.2 - 0.9 K/uL   Eosinophils Relative 0  %   Eosinophils Absolute 0.0 0 - 0.7 K/uL   Basophils Relative 0 %   Basophils Absolute 0.0 0 - 0.1 K/uL    Comment: Performed at [facility], [address] [place], [place] [address]  Urinalysis, Complete w Microscopic     Status: Abnormal   Collection Time: 03/30/17  8:49 AM  Result Value Ref Range   Color, Urine YELLOW YELLOW    Comment: MICROSCOPIC EXAM PERFORMED ON UNCONCENTRATED URINE   APPearance CLOUDY (A) CLEAR   Specific Gravity, Urine 1.015 1.005 - 1.030   pH 6.5 5.0 - 8.0   Glucose, UA NEGATIVE NEGATIVE mg/dL   Hgb urine dipstick MODERATE (A) NEGATIVE   Bilirubin Urine SMALL (A) NEGATIVE   Ketones, ur TRACE (A) NEGATIVE mg/dL   Protein, ur 30 (A) NEGATIVE mg/dL   Nitrite NEGATIVE NEGATIVE   Leukocytes, UA MODERATE (A) NEGATIVE   Squamous Epithelial / LPF 0-5 (A) NONE SEEN   WBC, UA 6-30 0 - 5 WBC/hpf   RBC / HPF 6-30 0 - 5 RBC/hpf   Bacteria, UA MANY (A) NONE SEEN    Comment: Performed at [facility], [address] [place],  [address]  Comprehensive metabolic panel     Status: Abnormal   Collection Time: 03/30/17  9:01 AM  Result Value Ref Range   Sodium 140 135 - 145 mmol/L   Potassium 3.4 (L) 3.5 - 5.1  mmol/L   Chloride 105 101 - 111 mmol/L   CO2 22 22 - 32 mmol/L   Glucose, Bld 183 (H) 65 - 99 mg/dL   BUN 22 (H) 6 - 20 mg/dL   Creatinine, Ser 0.88 0.44 - 1.00 mg/dL   Calcium 9.5 8.9 - 10.3 mg/dL   Total Protein 7.0 6.5 - 8.1 g/dL   Albumin 3.6 3.5 - 5.0 g/dL   AST 36 15 - 41 U/L   ALT 18 14 - 54 U/L   Alkaline Phosphatase 94 38 - 126 U/L   Total Bilirubin 0.5 0.3 - 1.2 mg/dL   GFR calc non Af Amer >60 >60 mL/min   GFR calc Af Amer >60 >60 mL/min    Comment: (NOTE) The eGFR has been calculated using the CKD EPI equation. This calculation has not been validated in all clinical situations. eGFR's persistently <60 mL/min signify possible Chronic Kidney Disease.    Anion gap 13 5 - 15    Comment: Performed at [facility], [address] [place], [place] [address]  CBC with Differential     Status: Abnormal   Collection Time: 03/30/17  9:01 AM  Result Value Ref Range   WBC 20.5 (H) 3.6 - 11.0 K/uL   RBC 3.14 (L) 3.80 - 5.20 MIL/uL   Hemoglobin 11.7 (L) 12.0 - 16.0 g/dL   HCT 34.1 (L) 35.0 - 47.0 %   MCV 108.7 (H) 80.0 - 100.0 fL   MCH 37.3 (H) 26.0 - 34.0 pg   MCHC 34.3 32.0 - 36.0 g/dL   RDW 15.5 (H) 11.5 - 14.5 %   Platelets 251 150 - 440 K/uL   Neutrophils Relative % 79 %   Neutro Abs 16.3 (H) 1.4 - 6.5 K/uL   Lymphocytes Relative 14 %   Lymphs Abs 2.8 1.0 - 3.6 K/uL   Monocytes Relative 6 %   Monocytes Absolute 1.3 (H) 0.2 - 0.9 K/uL   Eosinophils Relative 0 %   Eosinophils Absolute 0.0 0 - 0.7 K/uL   Basophils Relative 1 %   Basophils Absolute 0.1 0 - 0.1 K/uL    Comment: Performed at [facility], [address] [place], [place] [address]  Urinalysis, Complete w Microscopic     Status: Abnormal   Collection Time: 04/12/17  2:56 PM  Result Value Ref Range   Color, Urine YELLOW (A) YELLOW   APPearance CLEAR (A) CLEAR   Specific Gravity, Urine 1.011 1.005 - 1.030   pH 7.0 5.0 - 8.0   Glucose, UA NEGATIVE NEGATIVE mg/dL   Hgb urine dipstick NEGATIVE NEGATIVE   Bilirubin Urine NEGATIVE NEGATIVE   Ketones, ur NEGATIVE NEGATIVE mg/dL   Protein, ur NEGATIVE NEGATIVE mg/dL   Nitrite NEGATIVE NEGATIVE   Leukocytes, UA SMALL (A) NEGATIVE   RBC / HPF 0-5 0 - 5 RBC/hpf   WBC, UA 6-30 0 - 5 WBC/hpf   Bacteria, UA FEW (A) NONE SEEN   Squamous Epithelial / LPF 0-5 (A) NONE SEEN   Mucus PRESENT     Comment: Performed at [facility], [address], [place], [place] [address]  Urine culture     Status: Abnormal   Collection Time: 04/12/17  3:20 PM  Result Value Ref Range   Specimen Description      URINE, CLEAN CATCH Performed at [facility], [address] [place], [place] [address]    Special Requests      NONE Performed at [facility], [phone] [place], [place] [address]    Culture >=100,000 COLONIES/mL ESCHERICHIA COLI (A)    Report Status 04/15/2017 FINAL    Organism ID, Bacteria ESCHERICHIA COLI (A)       Susceptibility   Escherichia coli - MIC*    AMPICILLIN 8 SENSITIVE Sensitive     CEFAZOLIN <=4 SENSITIVE Sensitive     CEFTRIAXONE <=1 SENSITIVE Sensitive     CIPROFLOXACIN <=0.25 SENSITIVE Sensitive     GENTAMICIN <=1 SENSITIVE Sensitive     IMIPENEM <=0.25 SENSITIVE Sensitive     NITROFURANTOIN <=16 SENSITIVE Sensitive     TRIMETH/SULFA <=20 SENSITIVE Sensitive     AMPICILLIN/SULBACTAM 4 SENSITIVE Sensitive     PIP/TAZO <=4 SENSITIVE Sensitive     Extended ESBL NEGATIVE Sensitive     * >=100,000 COLONIES/mL ESCHERICHIA COLI  Urinalysis, Complete w Microscopic     Status: Abnormal   Collection Time: 05/04/17  8:32 AM  Result Value Ref Range   Color, Urine AMBER (A) YELLOW    Comment: BIOCHEMICALS MAY BE AFFECTED BY COLOR   APPearance HAZY (A) CLEAR   Specific Gravity, Urine 1.023 1.005 - 1.030   pH 5.0 5.0 - 8.0   Glucose, UA NEGATIVE NEGATIVE mg/dL   Hgb urine dipstick NEGATIVE NEGATIVE   Bilirubin Urine NEGATIVE NEGATIVE   Ketones, ur 5 (A) NEGATIVE mg/dL   Protein, ur 30 (A) NEGATIVE mg/dL   Nitrite NEGATIVE NEGATIVE   Leukocytes, UA SMALL (A) NEGATIVE   RBC / HPF 0-5 0 - 5 RBC/hpf   WBC, UA 11-20 0 - 5 WBC/hpf   Bacteria, UA NONE SEEN NONE SEEN   Squamous Epithelial / LPF 0-5 0 - 5    Comment: Please note change in reference range.   Mucus PRESENT    Hyaline Casts, UA PRESENT     Comment: Performed at [facility], [address] [place], [place] [address]  Comprehensive metabolic panel     Status: Abnormal   Collection Time: 05/04/17  8:42 AM  Result Value Ref Range   Sodium 136 135 - 145 mmol/L   Potassium 4.2 3.5 - 5.1 mmol/L   Chloride 103 101 - 111 mmol/L   CO2 20 (L) 22 - 32 mmol/L   Glucose, Bld 223 (H) 65 - 99 mg/dL   BUN 21 (H) 6 - 20 mg/dL   Creatinine, Ser 0.76  0.44 - 1.00 mg/dL   Calcium 9.3 8.9 - 10.3 mg/dL   Total Protein 7.1 6.5 - 8.1 g/dL   Albumin 3.5 3.5 - 5.0 g/dL   AST 27 15 - 41 U/L   ALT 14 14 - 54 U/L   Alkaline Phosphatase 55 38 - 126 U/L   Total Bilirubin 0.5 0.3 - 1.2 mg/dL   GFR calc non Af Amer >60 >60 mL/min   GFR calc Af Amer >60 >60 mL/min    Comment: (NOTE) The eGFR has been calculated using the CKD EPI equation. This calculation has not been validated in all clinical situations. eGFR's persistently <60 mL/min signify possible Chronic Kidney Disease.    Anion gap 13 5 - 15    Comment: Performed at [facility], [address] [place], [place] [address]  CBC with Differential/Platelet     Status: Abnormal   Collection Time: 05/04/17  8:42 AM  Result Value Ref Range   WBC 7.5 3.6 - 11.0 K/uL   RBC 2.85 (L) 3.80 - 5.20 MIL/uL   Hemoglobin 11.2 (L) 12.0 - 16.0 g/dL   HCT  31.7 (L) 35.0 - 47.0 %   MCV 111.3 (H) 80.0 - 100.0 fL   MCH 39.2 (H) 26.0 - 34.0 pg   MCHC 35.2 32.0 - 36.0 g/dL   RDW 17.7 (H) 11.5 - 14.5 %   Platelets 267 150 - 440 K/uL   Neutrophils Relative % 90 %   Neutro Abs 6.8 (H) 1.4 - 6.5 K/uL   Lymphocytes Relative 7 %   Lymphs Abs 0.5 (L) 1.0 - 3.6 K/uL   Monocytes Relative 2 %   Monocytes Absolute 0.2 0.2 - 0.9 K/uL   Eosinophils Relative 0 %   Eosinophils Absolute 0.0 0 - 0.7 K/uL   Basophils Relative 1 %   Basophils Absolute 0.0 0 - 0.1 K/uL    Comment: Performed at [facility], [address] [place], [place] [address]  Basic metabolic panel     Status: Abnormal   Collection Time: 05/18/17  9:31 AM  Result Value Ref Range   Sodium 138 135 - 145 mmol/L   Potassium 4.1 3.5 - 5.1 mmol/L   Chloride 104 101 - 111 mmol/L   CO2 23 22 - 32 mmol/L   Glucose, Bld 124 (H) 65 - 99 mg/dL   BUN 10 6 - 20 mg/dL   Creatinine, Ser 0.62 0.44 - 1.00 mg/dL   Calcium 9.2 8.9 - 10.3 mg/dL   GFR calc non Af Amer >60 >60 mL/min   GFR calc Af Amer >60 >60 mL/min    Comment: (NOTE) The eGFR  has been calculated using the CKD EPI equation. This calculation has not been validated in all clinical situations. eGFR's persistently <60 mL/min signify possible Chronic Kidney Disease.    Anion gap 11 5 - 15    Comment: Performed at [facility], [address] [place], [place] [address]  CBC with Differential     Status: Abnormal   Collection Time: 05/18/17  9:31 AM  Result Value Ref Range   WBC 6.5 3.6 - 11.0 K/uL   RBC 2.94 (L) 3.80 - 5.20 MIL/uL   Hemoglobin 11.2 (L) 12.0 - 16.0 g/dL   HCT 32.8 (L) 35.0 - 47.0 %   MCV 111.7 (H) 80.0 - 100.0 fL   MCH 38.3 (H) 26.0 - 34.0 pg   MCHC 34.3 32.0 - 36.0 g/dL   RDW 17.8 (H) 11.5 - 14.5 %   Platelets 280 150 - 440 K/uL   Neutrophils Relative % 56 %   Neutro Abs 3.6 1.4 - 6.5 K/uL   Lymphocytes Relative 34 %   Lymphs Abs 2.2 1.0 - 3.6 K/uL   Monocytes Relative 9 %   Monocytes Absolute 0.6 0.2 - 0.9 K/uL   Eosinophils Relative 0 %   Eosinophils Absolute 0.0 0 - 0.7 K/uL   Basophils Relative 1 %   Basophils Absolute 0.1 0 - 0.1 K/uL    Comment: Performed at [facility], [address], [place], [place] [address]    Radiology: Ct Outside Films Chest  Result Date: 04/05/2017 This examination belongs to an outside facility and is stored here for comparison purposes only.  Contact the originating outside institution for any associated report or interpretation.  Ct Outside Films Chest  Result Date: 04/05/2017 This examination belongs to an outside facility and is stored here for comparison purposes only.  Contact the originating outside institution for any associated report or interpretation.  Ct Outside Films Chest  Result Date: 04/05/2017 This examination belongs to an outside facility and is stored here  for comparison purposes only.  Contact the originating outside institution for any associated report or interpretation.  Mr Outside Films Head/face  Result Date: 04/05/2017 This examination belongs to an outside  facility and is stored here for comparison purposes only.  Contact the originating outside institution for any associated report or interpretation.  Mr Outside Films Head/face  Result Date: 04/05/2017 This examination belongs to an outside facility and is stored here for comparison purposes only.  Contact the originating outside institution for any associated report or interpretation.  Mr Outside Films Head/face  Result Date: 04/05/2017 This examination belongs to an outside facility and is stored here for comparison purposes only.  Contact the originating outside institution for any associated report or interpretation.  Ct Outside Films Body  Result Date: 04/05/2017 This examination belongs to an outside facility and is stored here for comparison purposes only.  Contact the originating outside institution for any associated report or interpretation.   No results found.  No results found.    Assessment and Plan: Patient Active Problem List   Diagnosis Date Noted  . 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 Joint pain following chemotherapy 02/28/2017  . Mucositis due to chemotherapy 02/28/2017  . Cancer associated pain 02/28/2017  . Leg pain, bilateral 12/01/2016  . Seborrheic keratosis 11/18/2016  . Cerebral edema ([place]) 06/21/2016  . Counseling regarding goals of care 01/28/2016  . Brain metastasis ([place]) 01/07/2016  . Malignant neoplasm of upper lobe, unspecified bronchus or lung ([place]) 10/09/2015  . Primary cancer of right lower lobe of lung ([place]) 10/08/2015  . Encounter for antineoplastic chemotherapy 06/24/2015  . Adenopathy   . 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 Cardiac enlargement 01/03/2015  . SOB (shortness of breath) 01/02/2015  . History of tobacco abuse 12/20/2014  . Personal history of nicotine dependence 12/20/2014  . History of cervical cancer 12/11/2014  . Tobacco abuse counseling 12/26/2012    1. Stage III NSCLC   We will continue to follow with her primary oncologist she is close to resume her chemotherapy with increased intervals.  We will continue to monitor.  Last scan showed some improvement in her tumor.  Unfortunately she did have some meds noted to the brain which will be followed up by oncologist again 2. COPD Mild doing well at this time she is stable we will continue with present management. 3. Chronic pain Syndrome  Pain is controlled she is at baseline we will continue with supportive care and monitor 4. SOB clinically improving mild COPD as noted above on her pulmonary functions feel it this is more likely related to the bouts of radiation pneumonitis that she had.  General Counseling: I have discussed the findings of the evaluation and examination with Mariann Blair.  I have also discussed any further diagnostic evaluation thatmay be needed or ordered today. Deanna Blair verbalizes understanding of the findings of todays visit. We also reviewed her medications today and discussed drug interactions and side effects including but not limited excessive drowsiness and altered mental states. We also discussed that there is always a risk not just to her but also people around her. she has been encouraged to call the office with any questions or concerns that should arise related to todays visit.    Time spent: 56m  I have personally obtained a history, examined the patient, evaluated laboratory and imaging results, formulated the assessment and plan and placed orders.    SAllyne Gee MD F[facility] Center[facility]  medicine

## 2017-06-01 ENCOUNTER — Inpatient Hospital Stay: Payer: BLUE CROSS/BLUE SHIELD

## 2017-06-01 ENCOUNTER — Inpatient Hospital Stay (HOSPITAL_BASED_OUTPATIENT_CLINIC_OR_DEPARTMENT_OTHER): Payer: BLUE CROSS/BLUE SHIELD | Admitting: Internal Medicine

## 2017-06-01 ENCOUNTER — Other Ambulatory Visit: Payer: Self-pay

## 2017-06-01 ENCOUNTER — Encounter: Payer: Self-pay | Admitting: Internal Medicine

## 2017-06-01 VITALS — BP 129/78 | HR 65 | Temp 97.9°F | Resp 18 | Ht 63.0 in | Wt 121.0 lb

## 2017-06-01 VITALS — BP 110/73 | HR 66 | Resp 18

## 2017-06-01 DIAGNOSIS — Z87891 Personal history of nicotine dependence: Secondary | ICD-10-CM | POA: Diagnosis not present

## 2017-06-01 DIAGNOSIS — G620 Drug-induced polyneuropathy: Secondary | ICD-10-CM | POA: Diagnosis not present

## 2017-06-01 DIAGNOSIS — Z8249 Family history of ischemic heart disease and other diseases of the circulatory system: Secondary | ICD-10-CM | POA: Diagnosis not present

## 2017-06-01 DIAGNOSIS — C3431 Malignant neoplasm of lower lobe, right bronchus or lung: Secondary | ICD-10-CM

## 2017-06-01 DIAGNOSIS — G893 Neoplasm related pain (acute) (chronic): Secondary | ICD-10-CM | POA: Diagnosis not present

## 2017-06-01 DIAGNOSIS — C7931 Secondary malignant neoplasm of brain: Secondary | ICD-10-CM

## 2017-06-01 DIAGNOSIS — Z79899 Other long term (current) drug therapy: Secondary | ICD-10-CM

## 2017-06-01 LAB — CBC WITH DIFFERENTIAL/PLATELET
BASOS ABS: 0 10*3/uL (ref 0–0.1)
BASOS PCT: 0 %
EOS ABS: 0 10*3/uL (ref 0–0.7)
Eosinophils Relative: 0 %
HEMATOCRIT: 35.4 % (ref 35.0–47.0)
HEMOGLOBIN: 12.3 g/dL (ref 12.0–16.0)
Lymphocytes Relative: 9 %
Lymphs Abs: 0.7 10*3/uL — ABNORMAL LOW (ref 1.0–3.6)
MCH: 39.5 pg — ABNORMAL HIGH (ref 26.0–34.0)
MCHC: 34.8 g/dL (ref 32.0–36.0)
MCV: 113.3 fL — ABNORMAL HIGH (ref 80.0–100.0)
MONOS PCT: 3 %
Monocytes Absolute: 0.2 10*3/uL (ref 0.2–0.9)
NEUTROS ABS: 6.8 10*3/uL — AB (ref 1.4–6.5)
NEUTROS PCT: 88 %
Platelets: 322 10*3/uL (ref 150–440)
RBC: 3.12 MIL/uL — ABNORMAL LOW (ref 3.80–5.20)
RDW: 17.1 % — ABNORMAL HIGH (ref 11.5–14.5)
WBC: 7.7 10*3/uL (ref 3.6–11.0)

## 2017-06-01 LAB — COMPREHENSIVE METABOLIC PANEL
ALK PHOS: 51 U/L (ref 38–126)
ALT: 11 U/L — AB (ref 14–54)
ANION GAP: 10 (ref 5–15)
AST: 33 U/L (ref 15–41)
Albumin: 3.6 g/dL (ref 3.5–5.0)
BILIRUBIN TOTAL: 0.2 mg/dL — AB (ref 0.3–1.2)
BUN: 23 mg/dL — ABNORMAL HIGH (ref 6–20)
CALCIUM: 9.2 mg/dL (ref 8.9–10.3)
CO2: 20 mmol/L — AB (ref 22–32)
CREATININE: 0.75 mg/dL (ref 0.44–1.00)
Chloride: 107 mmol/L (ref 101–111)
Glucose, Bld: 241 mg/dL — ABNORMAL HIGH (ref 65–99)
Potassium: 4 mmol/L (ref 3.5–5.1)
Sodium: 137 mmol/L (ref 135–145)
TOTAL PROTEIN: 6.8 g/dL (ref 6.5–8.1)

## 2017-06-01 LAB — URINALYSIS, COMPLETE (UACMP) WITH MICROSCOPIC
Bilirubin Urine: NEGATIVE
GLUCOSE, UA: NEGATIVE mg/dL
Hgb urine dipstick: NEGATIVE
KETONES UR: 5 mg/dL — AB
Leukocytes, UA: NEGATIVE
NITRITE: NEGATIVE
PH: 5 (ref 5.0–8.0)
Protein, ur: NEGATIVE mg/dL
Specific Gravity, Urine: 1.021 (ref 1.005–1.030)

## 2017-06-01 MED ORDER — DEXAMETHASONE SODIUM PHOSPHATE 10 MG/ML IJ SOLN
10.0000 mg | Freq: Once | INTRAMUSCULAR | Status: AC
  Administered 2017-06-01: 10 mg via INTRAVENOUS
  Filled 2017-06-01: qty 1

## 2017-06-01 MED ORDER — RAMUCIRUMAB CHEMO INJECTION 500 MG/50ML
10.0000 mg/kg | Freq: Once | INTRAVENOUS | Status: AC
  Administered 2017-06-01: 600 mg via INTRAVENOUS
  Filled 2017-06-01: qty 10

## 2017-06-01 MED ORDER — SODIUM CHLORIDE 0.9 % IV SOLN
Freq: Once | INTRAVENOUS | Status: AC
  Administered 2017-06-01: 10:00:00 via INTRAVENOUS
  Filled 2017-06-01: qty 1000

## 2017-06-01 MED ORDER — ACETAMINOPHEN 325 MG PO TABS
650.0000 mg | ORAL_TABLET | Freq: Once | ORAL | Status: AC
  Administered 2017-06-01: 650 mg via ORAL
  Filled 2017-06-01: qty 2

## 2017-06-01 MED ORDER — SODIUM CHLORIDE 0.9 % IV SOLN
60.0000 mg/m2 | Freq: Once | INTRAVENOUS | Status: AC
  Administered 2017-06-01: 100 mg via INTRAVENOUS
  Filled 2017-06-01: qty 10

## 2017-06-01 MED ORDER — SODIUM CHLORIDE 0.9% FLUSH
10.0000 mL | INTRAVENOUS | Status: DC | PRN
  Administered 2017-06-01: 10 mL via INTRAVENOUS
  Filled 2017-06-01: qty 10

## 2017-06-01 MED ORDER — HEPARIN SOD (PORK) LOCK FLUSH 100 UNIT/ML IV SOLN
500.0000 [IU] | Freq: Once | INTRAVENOUS | Status: AC | PRN
  Administered 2017-06-01: 500 [IU]

## 2017-06-01 MED ORDER — DIPHENHYDRAMINE HCL 50 MG/ML IJ SOLN
50.0000 mg | Freq: Once | INTRAMUSCULAR | Status: AC
  Administered 2017-06-01: 50 mg via INTRAVENOUS
  Filled 2017-06-01: qty 1

## 2017-06-01 MED ORDER — HEPARIN SOD (PORK) LOCK FLUSH 100 UNIT/ML IV SOLN
500.0000 [IU] | Freq: Once | INTRAVENOUS | Status: DC
  Filled 2017-06-01: qty 5

## 2017-06-01 NOTE — Assessment & Plan Note (Addendum)
#  Metastatic adenocarcinoma the lung-currently on Taxotere Cyramza; improving.  #Proceed with Taxotere [60 mg/m] Cyramza; every 4 weeks   #Pain secondary to malignancy/chemotherapy-continue fentanyl patch oxycodone.  # Hx of brain metastases- s/p GK in [place]; but new 2 mm lesion noted- STABLE on MRI April 16th 2019. 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  # follow up in 2 weeks/[facility]-/labs; follow up with me in 4 [26th] weeks-lab/Chemo/UA.

## 2017-06-02 NOTE — Progress Notes (Signed)
[place] OFFICE PROGRESS NOTE  Patient Care Team: Deanna Blair as PCP - General (Family Medicine) Deanna Gee, MD as Referring Physician (Internal Medicine)  Cancer Staging No matching staging information was found for the patient.   Oncology History   # FEB 2017-ADENO CA Right Lower Lung T4N2M0- STAGE IIIB [Bronch & Subcarinal LNBx]- March 8th START CARBO-ALIMTA x4 cycles- June 8th 2017 - IMPROVED FDG MULTI-FOCAL lesions/N2-LN activity. S/p [place]-- Alimta x6  # AUG 18th- Alimta- Avastin maintenance; SEP 13th DISCONTINUE AVASTIN [sec to cavitary lesion]; OCT 20th 2017- CT-STABLE Disease; NOV 8th CT/PET [facility]]- "overall slight progression-? Lymphangiectatic spread"  # Jan - April 2018Clement Blair; progression  # April 25th 2018- Carbo- Taxol x3 ccyles; June 2018- CT chest STABLE Disease;July 25th- Add Avastin to [place] taxol x6 cycles- Sep 2018-CT scan- [facility] STABLE   # Sep 2018- Avastin Maintence;   JAN 9th CT- right lung progression [s/p Bx- [place]; [place]; [facility]- 1/15]  # NOV 2nd 2017-MRI, [facility]- Brain mets [asymptomatic;Right frontal ~10m; ~312mlesions -appx 3-4 lesions; (right Frontal & Left parietal s/p GK x 2 lesions] ; June 2018- Progession vs radiation necrosis [July 2th-Add Avastin]  # [facility] 4 small subcentimeter brain lesions for which she had the Gamma knife. The main right frontal lesion gotten smaller; improve edema.  # Jan 2019- Progression [CT chest; [facility]]; Brain MRI- new sub cm lesions- monitored  # feb 14th 2019- Tax-Cyram   --------------------------------------------------------------     MOLECULAR STUDIES:  KRAS MUTATED/Tissues not sufficient for OTHER the molecular marker; will need repeat Bx ; GUARDIANT TESTING [[facility]]- pending. [phone] No targettable mutations.   ------------------------------------------------------   DIAGNOSIS: [ FEB 2017]- ADENO CA LUNG  STAGE:   IV      ;GOALS: palliative  CURRENT/MOST RECENT THERAPY- Tax-Ram      [place] cancer of right lower lobe of lung [place]     INTERVAL HISTORY:  [name].o.  female pleasant patient above history of metastatic adenocarcinoma lung is here for follow-up.   Patient currently denies any nausea vomiting.  Denies any significant worsening tingling numbness.  Denies any headaches  Review of Systems  Constitutional: Positive for malaise/fatigue. Negative for chills, diaphoresis, fever and weight loss.  HENT: Negative for nosebleeds and sore throat.   Eyes: Negative for double vision.  Respiratory: Positive for shortness of breath. Negative for cough, hemoptysis, sputum production and wheezing.   Cardiovascular: Negative for chest pain, palpitations, orthopnea and leg swelling.  Gastrointestinal: Negative for abdominal pain, blood in stool, constipation, diarrhea, heartburn, melena, nausea and vomiting.  Genitourinary: Negative for dysuria, frequency and urgency.  Musculoskeletal: Positive for back pain and joint pain.  Skin: Negative.  Negative for itching and rash.  Neurological: Negative for dizziness, tingling, focal weakness, weakness and headaches.  Endo/Heme/Allergies: Does not bruise/bleed easily.  Psychiatric/Behavioral: Negative for depression. The patient is not nervous/anxious and does not have insomnia.       PAST MEDICAL HISTORY :  Past Medical History:  Diagnosis Date  . Anxiety   . [place], 01/13/17  . Depression   . Depression with anxiety    Well controlled with Wellbutrin and Buspar  . Pneumonia     PAST SURGICAL HISTORY :   Past Surgical History:  Procedure Laterality Date  . ECTOPIC PREGNANCY SURGERY  1988  .  ELECTROMAGNETIC NAVIGATION BROCHOSCOPY N/A 02/10/2015   Procedure: ELECTROMAGNETIC NAVIGATION  BRONCHOSCOPY;  Surgeon: Deanna Lipps, MD;  Location: [facility] ORS;  Service: Cardiopulmonary;  Laterality: N/A;  . ENDOBRONCHIAL ULTRASOUND N/A 02/10/2015   Procedure: ENDOBRONCHIAL ULTRASOUND;  Surgeon: Deanna Lipps, MD;  Location: [facility] ORS;  Service: Cardiopulmonary;  Laterality: N/A;  . PERIPHERAL VASCULAR CATHETERIZATION N/A 03/05/2015   Procedure: Glori Luis Cath Insertion;  Surgeon: Deanna Cabal, MD;  Location: [place] CV LAB;  Service: Cardiovascular;  Laterality: N/A;    FAMILY HISTORY :   Family History  Problem Relation Age of Onset  . Cancer Father        Prostate Cancer  . Hypertension Father   . Stroke Father   . Hypertension Brother     SOCIAL HISTORY:   Social History   Tobacco Use  . Smoking status: Former Smoker    Packs/day: 0.25    Years: 30.00    Pack years: 7.50    Types: Cigarettes    Last attempt to quit: 02/06/2005    Years since quitting: 12.3  . Smokeless tobacco: Never Used  Substance Use Topics  . Alcohol use: Yes    Comment: not drank any in several months  . Drug use: No    ALLERGIES:  has No Known Allergies.  MEDICATIONS:  Current Outpatient Medications  Medication Sig Dispense Refill  . acetaminophen (TYLENOL) 500 MG tablet Take 1,000 mg by mouth every 4 (four) hours as needed.     [name] Kitchen amoxicillin-clavulanate (AUGMENTIN) 875-125 MG tablet Take 1 tablet by mouth 2 (two) times daily. 14 tablet 0  . dexamethasone (DECADRON) 4 MG tablet Take 1 tablet (4 mg total) by mouth 2 (two) times daily with a meal. Start the day prior to chemo; for 3 days. 60 tablet 3  . docusate sodium (COLACE) 100 MG capsule Take 100 mg by mouth 2 (two) times daily.    . fentaNYL (DURAGESIC - DOSED MCG/HR) 25 MCG/HR patch Place 1 patch (25 mcg total) onto the skin every 3 (three) days. 10 patch 0  . Fluticasone-Umeclidin-Vilant (TRELEGY ELLIPTA) 100-62.5-25 MCG/INH AEPB Inhale 1 puff into the lungs daily. 1 each 4  . ibuprofen (IBU) 400 MG tablet Take 1 tablet (400 mg total)  by mouth 3 (three) times daily as needed. 30 tablet 0  . lidocaine-prilocaine (EMLA) cream Apply 1 application topically as needed. 30 g 6  . Oxycodone HCl 10 MG TABS Take 1 tablet (10 mg total) by mouth every 6 (six) hours as needed (moderate to severe pain). 60 tablet 0  . zolpidem (AMBIEN) 5 MG tablet TAKE 1 TABLET BY MOUTH AT BEDTIME AS NEEDED FOR SLEEP 30 tablet 2  . magic mouthwash w/lidocaine SOLN Take 5 mLs by mouth 4 (four) times daily. (Patient not taking: Reported on 06/01/2017) 480 mL 3  . ondansetron (ZOFRAN) 8 MG tablet Take 1 tablet (8 mg total) by mouth every 8 (eight) hours as needed for nausea or vomiting (start 3 days; after chemo). (Patient not taking: Reported on 05/18/2017) 40 tablet 1  . polyethylene glycol (MIRALAX / GLYCOLAX) packet Take 17 g by mouth daily as needed.    . prochlorperazine (COMPAZINE) 10 MG tablet Take 1 tablet (10 mg total) by mouth every 6 (six) hours as needed for nausea or vomiting. (Patient not taking: Reported on 05/18/2017) 25 tablet 1   No current facility-administered medications for this visit.     PHYSICAL EXAMINATION: ECOG PERFORMANCE STATUS: 1 - Symptomatic but completely ambulatory  BP 129/78  Pulse 65   Temp 97.9 F (36.6 C) (Tympanic)   Resp 18   Ht _0  (1.6 m)   Wt 121 lb (54.9 kg)   LMP 06/16/2000 (Approximate) Comment: 15 yrs ago  BMI 21.43 kg/m   Filed Weights   06/01/17 0907  Weight: 121 lb (54.9 kg)    GENERAL: Well-nourished well-developed; Alert, no distress and comfortable.  Accompanied by family.  EYES: no pallor or icterus OROPHARYNX: no thrush or ulceration; NECK: supple; no lymph nodes felt. LYMPH:  no palpable lymphadenopathy in the axillary or inguinal regions LUNGS: Decreased breath sounds auscultation bilaterally. No wheeze or crackles HEART/CVS: regular rate & rhythm and no murmurs; No lower extremity edema ABDOMEN:abdomen soft, non-tender and normal bowel sounds. No hepatomegaly or splenomegaly.   Musculoskeletal:no cyanosis of digits and no clubbing  PSYCH: alert & oriented x 3 with fluent speech NEURO: no focal motor/sensory deficits SKIN:  no rashes or significant lesions    LABORATORY DATA:  I have reviewed the data as listed    Component Value Date/Time   NA 137 06/01/2017 0844   NA 139 01/03/2015 0910   K 4.0 06/01/2017 0844   CL 107 06/01/2017 0844   CO2 20 (L) 06/01/2017 0844   GLUCOSE 241 (H) 06/01/2017 0844   BUN 23 (H) 06/01/2017 0844   BUN 10 01/03/2015 0910   CREATININE 0.75 06/01/2017 0844   CALCIUM 9.2 06/01/2017 0844   PROT 6.8 06/01/2017 0844   PROT 6.5 01/03/2015 0910   ALBUMIN 3.6 06/01/2017 0844   ALBUMIN 3.8 01/03/2015 0910   AST 33 06/01/2017 0844   ALT 11 (L) 06/01/2017 0844   ALKPHOS 51 06/01/2017 0844   BILITOT 0.2 (L) 06/01/2017 0844   BILITOT <0.2 01/03/2015 0910   GFRNONAA >60 06/01/2017 0844   GFRAA >60 06/01/2017 0844    No results found for: SPEP, UPEP  Lab Results  Component Value Date   WBC 7.7 06/01/2017   NEUTROABS 6.8 (H) 06/01/2017   HGB 12.3 06/01/2017   HCT 35.4 06/01/2017   MCV 113.3 (H) 06/01/2017   PLT 322 06/01/2017      Chemistry      Component Value Date/Time   NA 137 06/01/2017 0844   NA 139 01/03/2015 0910   K 4.0 06/01/2017 0844   CL 107 06/01/2017 0844   CO2 20 (L) 06/01/2017 0844   BUN 23 (H) 06/01/2017 0844   BUN 10 01/03/2015 0910   CREATININE 0.75 06/01/2017 0844      Component Value Date/Time   CALCIUM 9.2 06/01/2017 0844   ALKPHOS 51 06/01/2017 0844   AST 33 06/01/2017 0844   ALT 11 (L) 06/01/2017 0844   BILITOT 0.2 (L) 06/01/2017 0844   BILITOT <0.2 01/03/2015 0910       RADIOGRAPHIC STUDIES: I have personally reviewed the radiological images as listed and agreed with the findings in the report. No results found.   ASSESSMENT & PLAN:  Primary cancer of right lower lobe of lung ([place]) #Metastatic adenocarcinoma the lung-currently on Taxotere Cyramza; improving.  #Proceed with  Taxotere [60 mg/m] Cyramza; every 4 weeks   #Pain secondary to malignancy/chemotherapy-continue fentanyl patch oxycodone.  # Hx of brain metastases- s/p GK in [place]; but new 2 mm lesion noted- STABLE on MRI April 16th 2019. 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  # follow up in 2 weeks/[facility]-/labs; follow up with me in 4 [26th] weeks-lab/Chemo/UA.    Orders Placed This Encounter  Procedures  . CBC with Differential/Platelet  Standing Status:   Future    Standing Expiration Date:   06/02/2018  . Comprehensive metabolic panel    Standing Status:   Future    Standing Expiration Date:   06/02/2018  . Urinalysis, Complete w Microscopic    Standing Status:   Future    Standing Expiration Date:   06/02/2018  . CBC with Differential/Platelet    Standing Status:   Future    Standing Expiration Date:   06/02/2018  . Comprehensive metabolic panel    Standing Status:   Future    Standing Expiration Date:   06/02/2018   All questions were answered. The patient knows to call the clinic with any problems, questions or concerns.      Cammie Sickle, MD 06/02/2017 6:58 PM

## 2017-06-05 ENCOUNTER — Other Ambulatory Visit: Payer: Self-pay | Admitting: Internal Medicine

## 2017-06-05 ENCOUNTER — Other Ambulatory Visit: Payer: Self-pay | Admitting: Nurse Practitioner

## 2017-06-05 DIAGNOSIS — C3431 Malignant neoplasm of lower lobe, right bronchus or lung: Secondary | ICD-10-CM

## 2017-06-05 DIAGNOSIS — G893 Neoplasm related pain (acute) (chronic): Secondary | ICD-10-CM

## 2017-06-06 MED ORDER — IBUPROFEN 400 MG PO TABS: 400.0000 mg | ORAL_TABLET | Freq: Three times a day (TID) | ORAL | 0 refills | Status: DC | PRN

## 2017-06-06 MED ORDER — FENTANYL 25 MCG/HR TD PT72: 25.0000 ug | MEDICATED_PATCH | TRANSDERMAL | 0 refills | Status: DC

## 2017-06-07 MED ORDER — OXYCODONE HCL 10 MG PO TABS: 10.0000 mg | ORAL_TABLET | Freq: Four times a day (QID) | ORAL | 0 refills | Status: DC | PRN

## 2017-06-07 NOTE — Telephone Encounter (Signed)
This belong to you, I think. I don't know why it keeps coming to [facility].

## 2017-06-15 ENCOUNTER — Inpatient Hospital Stay (HOSPITAL_BASED_OUTPATIENT_CLINIC_OR_DEPARTMENT_OTHER): Payer: BLUE CROSS/BLUE SHIELD | Admitting: Oncology

## 2017-06-15 ENCOUNTER — Inpatient Hospital Stay: Payer: BLUE CROSS/BLUE SHIELD

## 2017-06-15 ENCOUNTER — Other Ambulatory Visit: Payer: Self-pay

## 2017-06-15 ENCOUNTER — Other Ambulatory Visit: Payer: Self-pay | Admitting: Oncology

## 2017-06-15 ENCOUNTER — Encounter: Payer: Self-pay | Admitting: Oncology

## 2017-06-15 ENCOUNTER — Inpatient Hospital Stay: Payer: BLUE CROSS/BLUE SHIELD | Attending: Oncology

## 2017-06-15 VITALS — BP 108/71 | HR 70 | Temp 98.3°F | Resp 16 | Wt 123.0 lb

## 2017-06-15 DIAGNOSIS — R530 Neoplastic (malignant) related fatigue: Secondary | ICD-10-CM

## 2017-06-15 DIAGNOSIS — G893 Neoplasm related pain (acute) (chronic): Secondary | ICD-10-CM

## 2017-06-15 DIAGNOSIS — Z87891 Personal history of nicotine dependence: Secondary | ICD-10-CM | POA: Insufficient documentation

## 2017-06-15 DIAGNOSIS — Z5112 Encounter for antineoplastic immunotherapy: Secondary | ICD-10-CM | POA: Diagnosis present

## 2017-06-15 DIAGNOSIS — C3431 Malignant neoplasm of lower lobe, right bronchus or lung: Secondary | ICD-10-CM

## 2017-06-15 DIAGNOSIS — G620 Drug-induced polyneuropathy: Secondary | ICD-10-CM | POA: Insufficient documentation

## 2017-06-15 DIAGNOSIS — R5383 Other fatigue: Secondary | ICD-10-CM

## 2017-06-15 DIAGNOSIS — Z79899 Other long term (current) drug therapy: Secondary | ICD-10-CM

## 2017-06-15 DIAGNOSIS — M791 Myalgia, unspecified site: Secondary | ICD-10-CM | POA: Diagnosis not present

## 2017-06-15 DIAGNOSIS — M549 Dorsalgia, unspecified: Secondary | ICD-10-CM

## 2017-06-15 DIAGNOSIS — Z95828 Presence of other vascular implants and grafts: Secondary | ICD-10-CM

## 2017-06-15 DIAGNOSIS — C7931 Secondary malignant neoplasm of brain: Secondary | ICD-10-CM | POA: Diagnosis not present

## 2017-06-15 DIAGNOSIS — Z5111 Encounter for antineoplastic chemotherapy: Secondary | ICD-10-CM | POA: Diagnosis present

## 2017-06-15 LAB — CBC WITH DIFFERENTIAL/PLATELET
BASOS PCT: 1 %
Basophils Absolute: 0 10*3/uL (ref 0–0.1)
EOS ABS: 0 10*3/uL (ref 0–0.7)
Eosinophils Relative: 0 %
HCT: 34.1 % — ABNORMAL LOW (ref 35.0–47.0)
HEMOGLOBIN: 11.7 g/dL — AB (ref 12.0–16.0)
Lymphocytes Relative: 24 %
Lymphs Abs: 1.9 10*3/uL (ref 1.0–3.6)
MCH: 37.1 pg — ABNORMAL HIGH (ref 26.0–34.0)
MCHC: 34.2 g/dL (ref 32.0–36.0)
MCV: 108.3 fL — ABNORMAL HIGH (ref 80.0–100.0)
Monocytes Absolute: 1 10*3/uL — ABNORMAL HIGH (ref 0.2–0.9)
Monocytes Relative: 12 %
NEUTROS ABS: 5.1 10*3/uL (ref 1.4–6.5)
NEUTROS PCT: 63 %
Platelets: 219 10*3/uL (ref 150–440)
RBC: 3.15 MIL/uL — AB (ref 3.80–5.20)
RDW: 16.4 % — ABNORMAL HIGH (ref 11.5–14.5)
WBC: 8.1 10*3/uL (ref 3.6–11.0)

## 2017-06-15 LAB — COMPREHENSIVE METABOLIC PANEL
ALK PHOS: 52 U/L (ref 38–126)
ALT: 13 U/L — AB (ref 14–54)
ANION GAP: 9 (ref 5–15)
AST: 23 U/L (ref 15–41)
Albumin: 3.4 g/dL — ABNORMAL LOW (ref 3.5–5.0)
BUN: 6 mg/dL (ref 6–20)
CALCIUM: 9.3 mg/dL (ref 8.9–10.3)
CHLORIDE: 105 mmol/L (ref 101–111)
CO2: 23 mmol/L (ref 22–32)
CREATININE: 0.57 mg/dL (ref 0.44–1.00)
Glucose, Bld: 112 mg/dL — ABNORMAL HIGH (ref 65–99)
Potassium: 3.6 mmol/L (ref 3.5–5.1)
Sodium: 137 mmol/L (ref 135–145)
Total Bilirubin: 0.5 mg/dL (ref 0.3–1.2)
Total Protein: 6.6 g/dL (ref 6.5–8.1)

## 2017-06-15 MED ORDER — SODIUM CHLORIDE 0.9 % IV SOLN: 10.0000 mg | Freq: Once | INTRAVENOUS | Status: DC

## 2017-06-15 MED ORDER — HEPARIN SOD (PORK) LOCK FLUSH 100 UNIT/ML IV SOLN
500.0000 [IU] | Freq: Once | INTRAVENOUS | Status: AC
  Administered 2017-06-15: 500 [IU] via INTRAVENOUS

## 2017-06-15 MED ORDER — DEXAMETHASONE SODIUM PHOSPHATE 10 MG/ML IJ SOLN
10.0000 mg | Freq: Once | INTRAMUSCULAR | Status: AC
  Administered 2017-06-15: 10 mg via INTRAVENOUS
  Filled 2017-06-15: qty 1

## 2017-06-15 MED ORDER — SODIUM CHLORIDE 0.9% FLUSH
10.0000 mL | INTRAVENOUS | Status: DC | PRN
  Administered 2017-06-15: 10 mL via INTRAVENOUS
  Filled 2017-06-15: qty 10

## 2017-06-15 MED ORDER — DEXAMETHASONE SODIUM PHOSPHATE 10 MG/ML IJ SOLN: 10.0000 mg | Freq: Once | INTRAMUSCULAR | Status: AC

## 2017-06-15 MED ORDER — SODIUM CHLORIDE 0.9 % IV SOLN
Freq: Once | INTRAVENOUS | Status: AC
  Administered 2017-06-15: 10:00:00 via INTRAVENOUS
  Filled 2017-06-15: qty 1000

## 2017-06-15 MED ORDER — SODIUM CHLORIDE 0.9 % IV SOLN
INTRAVENOUS | Status: AC
  Filled 2017-06-15: qty 1000

## 2017-06-15 NOTE — Progress Notes (Signed)
Symptom Management Consult note [facility]  Telephone:([phone] [phone]  Patient Care Team: Rubye Beach as PCP - General (Family Medicine) Allyne Gee, MD as Referring Physician (Internal Medicine)   Name of the patient: Deanna Blair  [id number]  02-23-1955   Date of visit: 06/15/17  Diagnosis-right lower lobe lung cancer  Chief complaint/ Reason for visit- Follow-up/Fatigue  Heme/Onc history:  Patient was seen last by primary medical oncologist Dr. Rogue Bussing on 06/01/2017 prior to cycle 4 of Taxotere/Cyramza.  At that visit, she denied any nausea or vomiting or significant worsening tingling or numbness in BUE.  She denied any headaches.  She admitted to chronic back pain that was stable with fentanyl patch and oxycodone.  Had intermittent shortness of breath and worsening fatigue.  She was scheduled to return to clinic in 2 weeks to follow-up with symptom management with labs and then to see Dr. Rogue Bussing in approximately 4 weeks prior to cycle 5 of Taxotere/Cyramza.  Oncology History   # FEB 2017-ADENO CA Right Lower Lung T4N2M0- STAGE IIIB [Bronch & Subcarinal LNBx]- March 8th START CARBO-ALIMTA x4 cycles- June 8th 2017 - IMPROVED FDG MULTI-FOCAL lesions/N2-LN activity. S/p [place]-- Alimta x6  # AUG 18th- Alimta- Avastin maintenance; SEP 13th DISCONTINUE AVASTIN [sec to cavitary lesion]; OCT 20th 2017- CT-STABLE Disease; NOV 8th CT/PET [facility] clinic]- "overall slight progression-? Lymphangiectatic spread"  # Jan - April 2018Clement Husbands; progression  # April 25th 2018- Carbo- Taxol x3 ccyles; June 2018- CT chest STABLE Disease;July 25th- Add Avastin to [place] taxol x6 cycles- Sep 2018-CT scan- [facility] STABLE   # Sep 2018- Avastin Maintence;   JAN 9th CT- right lung progression [s/p Bx- [place]; [facility]; F-one- 1/15]  # NOV 2nd 2017-MRI, [facility]- Brain mets [asymptomatic;Right frontal ~1m; ~349mlesions -appx 3-4 lesions; (right  Frontal & Left parietal s/p GK x 2 lesions] ; June 2018- Progession vs radiation necrosis [July 2th-Add Avastin]  # Oct [facility]- [facility] 4 small subcentimeter brain lesions for which she had the Gamma knife. The main right frontal lesion gotten smaller; improve edema.  # Jan 2019- Progression [CT chest; [facility]]; Brain MRI- new sub cm lesions- monitored  # feb 14th 2019- Tax-Cyram   --------------------------------------------------------------     MOLECULAR STUDIES:  KRAS MUTATED/Tissues not sufficient for OTHER the molecular marker; will need repeat Bx ; GUARDIANT TESTING [[facility]]- pending. [phone] No targettable mutations.  ------------------------------------------------------   DIAGNOSIS: [ FEB 2017]- [place] CA LUNG  STAGE:   IV      ;GOALS: palliative  CURRENT/MOST RECENT THERAPY- Tax-Ram      [place] of [place] lobe of lung [place]   Interval history-  Today patient complains of fatigue.  Symptoms have become progressive since receiving Taxotere/Cyramza.  States sometimes she falls asleep during conversation.  She occasionally avoids activities because she is "too tired".  She denies any worsening shortness of breath, wheezing, infection-like symptoms, fevers, diarrhea or constipation.  States she does not have an appetite but is able to eat.  Her weight is up 2 pounds today.  She does not feel dehydrated.  She is able to consume plenty of fluids.  States her only concern is the worsening fatigue and "sleepiness".  Denies any new medications.   ECOG FS:1 - Symptomatic but completely ambulatory  Review of systems- Review of Systems  Constitutional: Positive for malaise/fatigue. Negative  for chills, fever and weight loss.  HENT: Negative for congestion and ear pain.   Eyes: Negative.  Negative for blurred vision and double vision.  Respiratory: Positive for shortness of  breath (Intermittent). Negative for cough and sputum production.   Cardiovascular: Negative.  Negative for chest pain, palpitations and leg swelling.  Gastrointestinal: Negative.  Negative for abdominal pain, constipation, diarrhea, nausea and vomiting.  Genitourinary: Negative for dysuria, frequency and urgency.  Musculoskeletal: Positive for back pain (Chronic). Negative for falls.  Skin: Negative.  Negative for rash.  Neurological: Negative.  Negative for weakness and headaches.  Endo/Heme/Allergies: Negative.  Does not bruise/bleed easily.  Psychiatric/Behavioral: Negative.  Negative for depression. The patient is not nervous/anxious and does not have insomnia.      Current treatment- S/p cycle 4 of Taxotere/Cyramza.  No Known Allergies   Past Medical History:  Diagnosis Date  . Anxiety   . Cancer of right lung ([place]) 02/13/2015, 01/13/17  . Depression   . Depression with anxiety    Well controlled with Wellbutrin and Buspar  . Pneumonia      Past Surgical History:  Procedure Laterality Date  . ECTOPIC PREGNANCY SURGERY  1988  . ELECTROMAGNETIC NAVIGATION BROCHOSCOPY N/A 02/10/2015   Procedure: ELECTROMAGNETIC NAVIGATION BRONCHOSCOPY;  Surgeon: Flora Lipps, MD;  Location: [facility] ORS;  Service: Cardiopulmonary;  Laterality: N/A;  . ENDOBRONCHIAL ULTRASOUND N/A 02/10/2015   Procedure: ENDOBRONCHIAL ULTRASOUND;  Surgeon: Flora Lipps, MD;  Location: [facility] ORS;  Service: Cardiopulmonary;  Laterality: N/A;  . PERIPHERAL VASCULAR CATHETERIZATION N/A 03/05/2015   Procedure: Glori Luis Cath Insertion;  Surgeon: Katha Cabal, MD;  Location: [facility];  Service: Cardiovascular;  Laterality: N/A;    Social History   Socioeconomic History  . Marital status: Married    Spouse name: Herbie Baltimore  . Number of children: 1  . Years of education: 57  . Highest education level: Not on file  Occupational History  . 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 Financial resource strain: Not on file  . Food insecurity:    Worry: Not on file    Inability: Not on file  . Transportation needs:    Medical: Not on file    Non-medical: Not on file  Tobacco Use  . Smoking status: Former Smoker    Packs/day: 0.25    Years: 30.00    Pack years: 7.50    Types: Cigarettes    Last attempt to quit: 02/06/2005    Years since quitting: 12.3  . Smokeless tobacco: Never Used  Substance and Sexual Activity  . Alcohol use: Yes    Comment: not drank any in several months  . Drug use: No  . Sexual activity: Yes    Partners: Male  Lifestyle  . Physical activity:    Days per week: Not on file    Minutes per session: Not on file  . Stress: Not on file  Relationships  . Social connections:    Talks on phone: Not on file    Gets together: Not on file    Attends religious service: Not on file    Active member of club or organization: Not on file    Attends meetings of clubs or organizations: Not on file    Relationship status: Not on file  . Intimate partner violence:    Fear of current or ex partner: Not on file    Emotionally abused: Not on file  Physically abused: Not on file    Forced sexual activity: Not on file  Other Topics Concern  . Not on file  Social History Narrative   [name] was born in [place], [place]. She moved with her family with [facility] and lived there for 13 years. She recently moved to [place] approximately 1 year ago. She lives with her husband of 28 years. They have an adult daughter who lives in [place]. 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 She is enjoying driving around and getting to know [place]. She and her husband enjoy cooking and baking. They also enjoy hiking when the weather permits.    Family History  Problem Relation Age of Onset  . Cancer Father        Prostate Cancer  . Hypertension Father   . Stroke Father   . Hypertension Brother      Current Outpatient  Medications:  .  acetaminophen (TYLENOL) 500 MG tablet, Take 1,000 mg by mouth every 4 (four) hours as needed. , Disp: , Rfl:  .  amoxicillin-clavulanate (AUGMENTIN) 875-125 MG tablet, Take 1 tablet by mouth 2 (two) times daily., Disp: 14 tablet, Rfl: 0 .  dexamethasone (DECADRON) 4 MG tablet, Take 1 tablet (4 mg total) by mouth 2 (two) times daily with a meal. Start the day prior to chemo; for 3 days., Disp: 60 tablet, Rfl: 3 .  docusate sodium (COLACE) 100 MG capsule, Take 100 mg by mouth 2 (two) times daily., Disp: , Rfl:  .  fentaNYL (DURAGESIC - DOSED MCG/HR) 25 MCG/HR patch, Place 1 patch (25 mcg total) onto the skin every 3 (three) days., Disp: 10 patch, Rfl: 0 .  Fluticasone-Umeclidin-Vilant (TRELEGY ELLIPTA) 100-62.5-25 MCG/INH AEPB, Inhale 1 puff into the lungs daily., Disp: 1 each, Rfl: 4 .  ibuprofen (IBU) 400 MG tablet, Take 1 tablet (400 mg total) by mouth 3 (three) times daily as needed., Disp: 30 tablet, Rfl: 0 .  lidocaine-prilocaine (EMLA) cream, Apply 1 application topically as needed., Disp: 30 g, Rfl: 6 .  magic mouthwash w/lidocaine SOLN, Take 5 mLs by mouth 4 (four) times daily., Disp: 480 mL, Rfl: 3 .  Oxycodone HCl 10 MG TABS, Take 1 tablet (10 mg total) by mouth every 6 (six) hours as needed (moderate to severe pain). (Patient taking differently: Take 10 mg by mouth every 4 (four) hours as needed (moderate to severe pain). ), Disp: 60 tablet, Rfl: 0 .  polyethylene glycol (MIRALAX / GLYCOLAX) packet, Take 17 g by mouth daily as needed., Disp: , Rfl:  .  zolpidem (AMBIEN) 5 MG tablet, TAKE 1 TABLET BY MOUTH AT BEDTIME AS NEEDED FOR SLEEP, Disp: 30 tablet, Rfl: 2 .  ondansetron (ZOFRAN) 8 MG tablet, Take 1 tablet (8 mg total) by mouth every 8 (eight) hours as needed for nausea or vomiting (start 3 days; after chemo). (Patient not taking: Reported on 06/15/2017), Disp: 40 tablet, Rfl: 1 .  prochlorperazine (COMPAZINE) 10 MG tablet, Take 1 tablet (10 mg total) by mouth every 6  (six) hours as needed for nausea or vomiting. (Patient not taking: Reported on 05/18/2017), Disp: 25 tablet, Rfl: 1 No current facility-administered medications for this visit.   Facility-Administered Medications Ordered in Other Visits:  .  0.9 %  sodium chloride infusion, , Intravenous, Continuous, Marrissa Dai E, NP .  dexamethasone (DECADRON) injection 10 mg, 10 mg, Intravenous, Once, Jailee Jaquez E, NP .  sodium chloride flush (NS) 0.9 % injection 10 mL, 10  mL, Intravenous, PRN, Faythe Casa E, NP, 10 mL at 06/15/17 1000  Physical exam:  Vitals:   06/15/17 0936  BP: 108/71  Pulse: 70  Resp: 16  Temp: 98.3 F (36.8 C)  TempSrc: Tympanic  Weight: 123 lb (55.8 kg)   Physical Exam  Constitutional: She is oriented to person, place, and time. Vital signs are normal. She appears well-developed and well-nourished.  HENT:  Head: Normocephalic and atraumatic.  Eyes: Pupils are equal, round, and reactive to light.  Neck: Normal range of motion.  Cardiovascular: Normal rate, regular rhythm and normal heart sounds.  No murmur heard. Pulmonary/Chest: Effort normal and breath sounds normal. She has no wheezes.  Abdominal: Soft. Normal appearance and bowel sounds are normal. She exhibits no distension. There is no tenderness.  Musculoskeletal: Normal range of motion. She exhibits no edema.  Neurological: She is alert and oriented to person, place, and time.  Skin: Skin is warm and dry. No rash noted.  Psychiatric: Judgment normal.     CMP Latest Ref Rng & Units 06/15/2017  Glucose 65 - 99 mg/dL 112(H)  BUN 6 - 20 mg/dL 6  Creatinine 0.44 - 1.00 mg/dL 0.57  Sodium 135 - 145 mmol/L 137  Potassium 3.5 - 5.1 mmol/L 3.6  Chloride 101 - 111 mmol/L 105  CO2 22 - 32 mmol/L 23  Calcium 8.9 - 10.3 mg/dL 9.3  Total Protein 6.5 - 8.1 g/dL 6.6  Total Bilirubin 0.3 - 1.2 mg/dL 0.5  Alkaline Phos 38 - 126 U/L 52  AST 15 - 41 U/L 23  ALT 14 - 54 U/L 13(L)   CBC Latest Ref Rng & Units  06/15/2017  WBC 3.6 - 11.0 K/uL 8.1  Hemoglobin 12.0 - 16.0 g/dL 11.7(L)  Hematocrit 35.0 - 47.0 % 34.1(L)  Platelets 150 - 440 K/uL 219    No images are attached to the encounter.  No results found.   Assessment and plan- Patient is a 62 y.o. female who presents to symptom management for follow-up after cycle 4 of Taxotere/Cyramza and worsening fatigue.  1.  Lung cancer: S/p 4 cycles of Taxotere/Cyramza.  So far have tolerated well.  Has history of brain metastasis and is status post GK at the [facility].  Noted to have a new 2 mm lesion.  Most recent MRI from 04/19/2016 showed stable disease.  She is scheduled to return to clinic to see Dr. Rogue Bussing on 06/29/2017 with labs, cycle 5 and MD assessment.   2.  Fatigue: Likely multifactorial.  Patient states she is sleeping well at night but needs naps daily.  Labs look pretty good today.  Vital signs are stable.  Weight is up 2 pounds.  She does not appear dehydrated.  We will give her 0.5 L of NaCl with 10 mg Decadron to help with fatigue.  Patient in agreement with plan.  We will continue to monitor this.   3.  Neuropathy: This is not any worse today.  Continue to monitor.  4.  Chronic back pain/joint pain: Continue fentanyl and oxycodone as prescribed by Dr. Rogue Bussing.  Visit Diagnosis 1. Primary cancer of right lower lobe of lung ([place])   2. Neoplastic malignant related fatigue     Patient expressed understanding and was in agreement with this plan. She also understands that She can call clinic at any time with any questions, concerns, or complaints.   Greater than 50% was spent in counseling and coordination of care with this patient including but not limited to discussion  of the relevant topics above (See A&P) including, but not limited to diagnosis and management of acute and chronic medical conditions.    Faythe Casa, AGNP-C [facility] [place]- [phone] Pager- [phone] 06/15/2017 1:12  PM

## 2017-06-27 ENCOUNTER — Other Ambulatory Visit: Payer: Self-pay | Admitting: *Deleted

## 2017-06-27 DIAGNOSIS — G893 Neoplasm related pain (acute) (chronic): Secondary | ICD-10-CM

## 2017-06-27 DIAGNOSIS — C3431 Malignant neoplasm of lower lobe, right bronchus or lung: Secondary | ICD-10-CM

## 2017-06-27 MED ORDER — OXYCODONE HCL 10 MG PO TABS: 10.0000 mg | ORAL_TABLET | Freq: Four times a day (QID) | ORAL | 0 refills | Status: DC | PRN

## 2017-06-29 ENCOUNTER — Inpatient Hospital Stay: Payer: BLUE CROSS/BLUE SHIELD

## 2017-06-29 ENCOUNTER — Inpatient Hospital Stay (HOSPITAL_BASED_OUTPATIENT_CLINIC_OR_DEPARTMENT_OTHER): Payer: BLUE CROSS/BLUE SHIELD | Admitting: Internal Medicine

## 2017-06-29 ENCOUNTER — Encounter: Payer: Self-pay | Admitting: Internal Medicine

## 2017-06-29 VITALS — BP 133/73 | HR 66 | Resp 20

## 2017-06-29 VITALS — BP 117/76 | HR 61 | Temp 97.3°F | Resp 16 | Wt 118.0 lb

## 2017-06-29 DIAGNOSIS — C3431 Malignant neoplasm of lower lobe, right bronchus or lung: Secondary | ICD-10-CM

## 2017-06-29 DIAGNOSIS — M898X5 Other specified disorders of bone, thigh: Secondary | ICD-10-CM

## 2017-06-29 DIAGNOSIS — M549 Dorsalgia, unspecified: Secondary | ICD-10-CM

## 2017-06-29 DIAGNOSIS — Z79899 Other long term (current) drug therapy: Secondary | ICD-10-CM

## 2017-06-29 DIAGNOSIS — G620 Drug-induced polyneuropathy: Secondary | ICD-10-CM | POA: Diagnosis not present

## 2017-06-29 DIAGNOSIS — R530 Neoplastic (malignant) related fatigue: Secondary | ICD-10-CM | POA: Diagnosis not present

## 2017-06-29 DIAGNOSIS — Z5112 Encounter for antineoplastic immunotherapy: Secondary | ICD-10-CM | POA: Diagnosis not present

## 2017-06-29 DIAGNOSIS — M791 Myalgia, unspecified site: Secondary | ICD-10-CM

## 2017-06-29 DIAGNOSIS — G893 Neoplasm related pain (acute) (chronic): Secondary | ICD-10-CM

## 2017-06-29 DIAGNOSIS — Z87891 Personal history of nicotine dependence: Secondary | ICD-10-CM

## 2017-06-29 DIAGNOSIS — C7931 Secondary malignant neoplasm of brain: Secondary | ICD-10-CM

## 2017-06-29 DIAGNOSIS — E860 Dehydration: Secondary | ICD-10-CM

## 2017-06-29 LAB — CBC WITH DIFFERENTIAL/PLATELET
BASOS ABS: 0 10*3/uL (ref 0–0.1)
BASOS PCT: 0 %
Eosinophils Absolute: 0 10*3/uL (ref 0–0.7)
Eosinophils Relative: 0 %
HEMATOCRIT: 34.3 % — AB (ref 35.0–47.0)
Hemoglobin: 12 g/dL (ref 12.0–16.0)
Lymphocytes Relative: 18 %
Lymphs Abs: 1.2 10*3/uL (ref 1.0–3.6)
MCH: 37.5 pg — ABNORMAL HIGH (ref 26.0–34.0)
MCHC: 35.1 g/dL (ref 32.0–36.0)
MCV: 106.9 fL — AB (ref 80.0–100.0)
MONO ABS: 0.6 10*3/uL (ref 0.2–0.9)
Monocytes Relative: 9 %
NEUTROS ABS: 4.9 10*3/uL (ref 1.4–6.5)
Neutrophils Relative %: 73 %
Platelets: 312 10*3/uL (ref 150–440)
RBC: 3.2 MIL/uL — ABNORMAL LOW (ref 3.80–5.20)
RDW: 16.6 % — AB (ref 11.5–14.5)
WBC: 6.7 10*3/uL (ref 3.6–11.0)

## 2017-06-29 LAB — URINALYSIS, COMPLETE (UACMP) WITH MICROSCOPIC
BILIRUBIN URINE: NEGATIVE
Glucose, UA: NEGATIVE mg/dL
Hgb urine dipstick: NEGATIVE
Ketones, ur: NEGATIVE mg/dL
Leukocytes, UA: NEGATIVE
Nitrite: NEGATIVE
PH: 6 (ref 5.0–8.0)
Protein, ur: NEGATIVE mg/dL
SPECIFIC GRAVITY, URINE: 1.015 (ref 1.005–1.030)

## 2017-06-29 LAB — COMPREHENSIVE METABOLIC PANEL
ALK PHOS: 41 U/L (ref 38–126)
ALT: 14 U/L (ref 0–44)
ANION GAP: 11 (ref 5–15)
AST: 27 U/L (ref 15–41)
Albumin: 3.7 g/dL (ref 3.5–5.0)
BUN: 19 mg/dL (ref 8–23)
CALCIUM: 9.3 mg/dL (ref 8.9–10.3)
CO2: 21 mmol/L — ABNORMAL LOW (ref 22–32)
Chloride: 105 mmol/L (ref 98–111)
Creatinine, Ser: 0.68 mg/dL (ref 0.44–1.00)
GFR calc Af Amer: >60 mL/min (ref >60)
GFR calc non Af Amer: >60 mL/min (ref >60)
GLUCOSE: 138 mg/dL — AB (ref 70–99)
Potassium: 4.2 mmol/L (ref 3.5–5.1)
Sodium: 137 mmol/L (ref 135–145)
TOTAL PROTEIN: 6.8 g/dL (ref 6.5–8.1)
Total Bilirubin: 0.3 mg/dL (ref 0.3–1.2)

## 2017-06-29 MED ORDER — HEPARIN SOD (PORK) LOCK FLUSH 100 UNIT/ML IV SOLN
500.0000 [IU] | Freq: Once | INTRAVENOUS | Status: AC
  Administered 2017-06-29: 500 [IU] via INTRAVENOUS
  Filled 2017-06-29: qty 5

## 2017-06-29 MED ORDER — SODIUM CHLORIDE 0.9 % IV SOLN
60.0000 mg/m2 | Freq: Once | INTRAVENOUS | Status: AC
  Administered 2017-06-29: 100 mg via INTRAVENOUS
  Filled 2017-06-29: qty 10

## 2017-06-29 MED ORDER — DEXAMETHASONE SODIUM PHOSPHATE 10 MG/ML IJ SOLN
12.0000 mg | Freq: Once | INTRAMUSCULAR | Status: AC
  Administered 2017-06-29: 12 mg via INTRAVENOUS
  Filled 2017-06-29: qty 2

## 2017-06-29 MED ORDER — DIPHENHYDRAMINE HCL 50 MG/ML IJ SOLN
50.0000 mg | Freq: Once | INTRAMUSCULAR | Status: AC
  Administered 2017-06-29: 50 mg via INTRAVENOUS
  Filled 2017-06-29: qty 1

## 2017-06-29 MED ORDER — ACETAMINOPHEN 325 MG PO TABS
650.0000 mg | ORAL_TABLET | Freq: Once | ORAL | Status: AC
  Administered 2017-06-29: 650 mg via ORAL
  Filled 2017-06-29: qty 2

## 2017-06-29 MED ORDER — SODIUM CHLORIDE 0.9 % IV SOLN
Freq: Once | INTRAVENOUS | Status: AC
Start: 2017-06-29 — End: 2017-06-29
  Administered 2017-06-29: 10:00:00 via INTRAVENOUS
  Filled 2017-06-29: qty 1000

## 2017-06-29 MED ORDER — SODIUM CHLORIDE 0.9 % IV SOLN
530.0000 mg | Freq: Once | INTRAVENOUS | Status: AC
  Administered 2017-06-29: 530 mg via INTRAVENOUS
  Filled 2017-06-29: qty 50

## 2017-06-29 MED ORDER — SODIUM CHLORIDE 0.9% FLUSH
10.0000 mL | INTRAVENOUS | Status: DC | PRN
  Administered 2017-06-29: 10 mL via INTRAVENOUS
  Filled 2017-06-29: qty 10

## 2017-06-29 NOTE — Assessment & Plan Note (Addendum)
#  Metastatic adenocarcinoma the lung-currently on Taxotere Cyramza; improving.  # Proceed with Taxotere [60 mg/m] Cyramza; every 4 weeks; however worsening fatigue.  #Pain secondary to malignancy/chemotherapy-continue fentanyl patch oxycodone; however worsening left thigh pain -recommend bone scan for further evaluation.  # Hx of brain metastases- s/p GK in [place]; but new 2 mm lesion noted- STABLE on MRI April 16th 2019. 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  Continue gabapentin.  #Worsening fatigue-recommend increasing the dose of dexamethasone to 12 mg on days of chemotherapy.  # follow up in 4 weeks/labs/Tax-Cyramza;udenyca;  possible IVFs/steroids in 2 weeks.  Patient going to [place] on August 8 for restaging purposes.

## 2017-06-29 NOTE — Progress Notes (Signed)
[place] OFFICE PROGRESS NOTE  Patient Care Team: Rubye Beach as PCP - General (Family Medicine) Allyne Gee, MD as Referring Physician (Internal Medicine)  Cancer Staging No matching staging information was found for the patient.   Oncology History   # FEB 2017-[place] CA Right Lower Lung T4N2M0- STAGE IIIB [Bronch & Subcarinal LNBx]- March 8th START CARBO-ALIMTA x4 cycles- June 8th 2017 - IMPROVED FDG MULTI-FOCAL lesions/N2-LN activity. S/p [place]-- Alimta x6  # AUG 18th- Alimta- Avastin maintenance; SEP 13th DISCONTINUE AVASTIN [sec to cavitary lesion]; OCT 20th 2017- CT-STABLE Disease; NOV 8th CT/PET [facility]]- "overall slight progression-? Lymphangiectatic spread"  # Jan - April 2018Clement Husbands; progression  # April 25th 2018- Carbo- Taxol x3 ccyles; June 2018- CT chest STABLE Disease;July 25th- Add Avastin to [place] taxol x6 cycles- Sep 2018-CT scan- [facility] STABLE   # Sep 2018- Avastin Maintence;   JAN 9th CT- right lung progression [s/p Bx- [place]; [place] [facility]; F-one- 1/15]  # NOV 2nd 2017-MRI, [facility]- Brain mets [asymptomatic;Right frontal ~51m; ~386mlesions -appx 3-4 lesions; (right Frontal & Left parietal s/p GK x 2 lesions] ; June 2018- Progession vs radiation necrosis [July 2th-Add Avastin]  # [facility] 4 small subcentimeter brain lesions for which she had the Gamma knife. The main right frontal lesion gotten smaller; improve edema.  # Jan 2019- Progression [CT chest; [facility]]; Brain MRI- new sub cm lesions- monitored  # feb 14th 2019- Tax-Cyram   --------------------------------------------------------------     MOLECULAR STUDIES:  KRAS MUTATED/Tissues not sufficient for OTHER the molecular marker; will need repeat Bx ; GUARDIANT TESTING [[facility]]- pending. [id number] No targettable mutations.   ------------------------------------------------------   DIAGNOSIS: [ FEB 2017]- [place] CA LUNG  STAGE:   IV      ;GOALS: palliative  CURRENT/MOST RECENT THERAPY- Tax-Ram      Primary cancer of right lower lobe of lung ([place]     INTERVAL HISTORY:  Deanna Bullis89.o.  female pleasant patient above history of adenocarcinoma the lung currently on Cyramza Taxotere is here for follow-up.  Patient continues to complain of generalized body aches muscle pain post chemotherapy.  However noted to have worsening pain in her left thigh region.  This is not worse with movement.  Constant.   Otherwise appetite is fair.  Complains of worsening fatigue post chemotherapy.   Review of Systems  Constitutional: Positive for malaise/fatigue. Negative for chills, diaphoresis, fever and weight loss.  HENT: Negative for nosebleeds and sore throat.   Eyes: Negative for double vision.  Respiratory: Negative for cough, hemoptysis, sputum production, shortness of breath and wheezing.   Cardiovascular: Negative for chest pain, palpitations, orthopnea and leg swelling.  Gastrointestinal: Negative for abdominal pain, blood in stool, constipation, diarrhea, heartburn, melena, nausea and vomiting.  Genitourinary: Negative for dysuria, frequency and urgency.  Musculoskeletal: Positive for joint pain and myalgias. Negative for back pain.  Skin: Negative.  Negative for itching and rash.  Neurological: Negative for dizziness, tingling, focal weakness, weakness and headaches.  Endo/Heme/Allergies: Does not bruise/bleed easily.  Psychiatric/Behavioral: Negative for depression. The patient is not nervous/anxious and does not have insomnia.       PAST MEDICAL HISTORY :  Past Medical History:  Diagnosis Date  . Anxiety   . Cancer of right lung ([place]/09/2015, 01/13/17  . Depression   . Depression with anxiety    Well  controlled with Wellbutrin and Buspar  . Pneumonia     PAST SURGICAL HISTORY :    Past Surgical History:  Procedure Laterality Date  . ECTOPIC PREGNANCY SURGERY  1988  . ELECTROMAGNETIC NAVIGATION BROCHOSCOPY N/A 02/10/2015   Procedure: ELECTROMAGNETIC NAVIGATION BRONCHOSCOPY;  Surgeon: Flora Lipps, MD;  Location: [facility] ORS;  Service: Cardiopulmonary;  Laterality: N/A;  . ENDOBRONCHIAL ULTRASOUND N/A 02/10/2015   Procedure: ENDOBRONCHIAL ULTRASOUND;  Surgeon: Flora Lipps, MD;  Location: [facility] ORS;  Service: Cardiopulmonary;  Laterality: N/A;  . PERIPHERAL VASCULAR CATHETERIZATION N/A 03/05/2015   Procedure: Glori Luis Cath Insertion;  Surgeon: Katha Cabal, MD;  Location: [place] CV LAB;  Service: Cardiovascular;  Laterality: N/A;    FAMILY HISTORY :   Family History  Problem Relation Age of Onset  . Cancer Father        Prostate Cancer  . Hypertension Father   . Stroke Father   . Hypertension Brother     SOCIAL HISTORY:   Social History   Tobacco Use  . Smoking status: Former Smoker    Packs/day: 0.25    Years: 30.00    Pack years: 7.50    Types: Cigarettes    Last attempt to quit: 02/06/2005    Years since quitting: 12.4  . Smokeless tobacco: Never Used  Substance Use Topics  . Alcohol use: Yes    Comment: not drank any in several months  . Drug use: No    ALLERGIES:  has No Known Allergies.  MEDICATIONS:  Current Outpatient Medications  Medication Sig Dispense Refill  . acetaminophen (TYLENOL) 500 MG tablet Take 1,000 mg by mouth every 4 (four) hours as needed.     [name] Kitchen dexamethasone (DECADRON) 4 MG tablet Take 1 tablet (4 mg total) by mouth 2 (two) times daily with a meal. Start the day prior to chemo; for 3 days. 60 tablet 3  . docusate sodium (COLACE) 100 MG capsule Take 100 mg by mouth 2 (two) times daily.    . fentaNYL (DURAGESIC - DOSED MCG/HR) 25 MCG/HR patch Place 1 patch (25 mcg total) onto the skin every 3 (three) days. 10 patch 0  . Fluticasone-Umeclidin-Vilant (TRELEGY ELLIPTA) 100-62.5-25 MCG/INH AEPB Inhale 1 puff into the lungs daily. 1  each 4  . ibuprofen (IBU) 400 MG tablet Take 1 tablet (400 mg total) by mouth 3 (three) times daily as needed. 30 tablet 0  . lidocaine-prilocaine (EMLA) cream Apply 1 application topically as needed. 30 g 6  . magic mouthwash w/lidocaine SOLN Take 5 mLs by mouth 4 (four) times daily. 480 mL 3  . ondansetron (ZOFRAN) 8 MG tablet Take 1 tablet (8 mg total) by mouth every 8 (eight) hours as needed for nausea or vomiting (start 3 days; after chemo). 40 tablet 1  . Oxycodone HCl 10 MG TABS Take 1 tablet (10 mg total) by mouth every 6 (six) hours as needed (moderate to severe pain). 60 tablet 0  . polyethylene glycol (MIRALAX / GLYCOLAX) packet Take 17 g by mouth daily as needed.    . prochlorperazine (COMPAZINE) 10 MG tablet Take 1 tablet (10 mg total) by mouth every 6 (six) hours as needed for nausea or vomiting. 25 tablet 1  . zolpidem (AMBIEN) 5 MG tablet TAKE 1 TABLET BY MOUTH AT BEDTIME AS NEEDED FOR SLEEP 30 tablet 2   No current facility-administered medications for this visit.    Facility-Administered Medications Ordered in Other Visits  Medication Dose Route Frequency Provider Last Rate Last Dose  .  0.9 %  sodium chloride infusion   Intravenous Continuous Jacquelin Hawking, NP      . dexamethasone (DECADRON) injection 10 mg  10 mg Intravenous Once Jacquelin Hawking, NP        PHYSICAL EXAMINATION: ECOG PERFORMANCE STATUS: 1 - Symptomatic but completely ambulatory  BP 117/76 (BP Location: Left Arm, Patient Position: Sitting)   Pulse 61   Temp (!) 97.3 F (36.3 C) (Tympanic)   Resp 16   Wt 118 lb (53.5 kg)   LMP 06/16/2000 (Approximate) Comment: 15 yrs ago  BMI 20.90 kg/m   Filed Weights   06/29/17 0837  Weight: 118 lb (53.5 kg)    GENERAL: Well-nourished well-developed; Alert, no distress and comfortable.  Accompanied by family.  EYES: no pallor or icterus OROPHARYNX: no thrush or ulceration; NECK: supple; no lymph nodes felt. LYMPH:  no palpable lymphadenopathy in the  axillary or inguinal regions LUNGS: Decreased breath sounds auscultation bilaterally. No wheeze or crackles HEART/CVS: regular rate & rhythm and no murmurs; No lower extremity edema ABDOMEN:abdomen soft, non-tender and normal bowel sounds. No hepatomegaly or splenomegaly.  Musculoskeletal:no cyanosis of digits and no clubbing  PSYCH: alert & oriented x 3 with fluent speech NEURO: no focal motor/sensory deficits SKIN:  no rashes or significant lesions    LABORATORY DATA:  I have reviewed the data as listed    Component Value Date/Time   NA 137 06/29/2017 0813   NA 139 01/03/2015 0910   K 4.2 06/29/2017 0813   CL 105 06/29/2017 0813   CO2 21 (L) 06/29/2017 0813   GLUCOSE 138 (H) 06/29/2017 0813   BUN 19 06/29/2017 0813   BUN 10 01/03/2015 0910   CREATININE 0.68 06/29/2017 0813   CALCIUM 9.3 06/29/2017 0813   PROT 6.8 06/29/2017 0813   PROT 6.5 01/03/2015 0910   ALBUMIN 3.7 06/29/2017 0813   ALBUMIN 3.8 01/03/2015 0910   AST 27 06/29/2017 0813   ALT 14 06/29/2017 0813   ALKPHOS 41 06/29/2017 0813   BILITOT 0.3 06/29/2017 0813   BILITOT <0.2 01/03/2015 0910   GFRNONAA >60 06/29/2017 0813   GFRAA >60 06/29/2017 0813    No results found for: SPEP, UPEP  Lab Results  Component Value Date   WBC 6.7 06/29/2017   NEUTROABS 4.9 06/29/2017   HGB 12.0 06/29/2017   HCT 34.3 (L) 06/29/2017   MCV 106.9 (H) 06/29/2017   PLT 312 06/29/2017      Chemistry      Component Value Date/Time   NA 137 06/29/2017 0813   NA 139 01/03/2015 0910   K 4.2 06/29/2017 0813   CL 105 06/29/2017 0813   CO2 21 (L) 06/29/2017 0813   BUN 19 06/29/2017 0813   BUN 10 01/03/2015 0910   CREATININE 0.68 06/29/2017 0813      Component Value Date/Time   CALCIUM 9.3 06/29/2017 0813   ALKPHOS 41 06/29/2017 0813   AST 27 06/29/2017 0813   ALT 14 06/29/2017 0813   BILITOT 0.3 06/29/2017 0813   BILITOT <0.2 01/03/2015 0910       RADIOGRAPHIC STUDIES: I have personally reviewed the radiological  images as listed and agreed with the findings in the report. No results found.   ASSESSMENT & PLAN:  Primary cancer of right lower lobe of lung ([place]) #Metastatic adenocarcinoma the lung-currently on Taxotere Cyramza; improving.  # Proceed with Taxotere [60 mg/m] Cyramza; every 4 weeks; however worsening fatigue.  #Pain secondary to malignancy/chemotherapy-continue fentanyl patch oxycodone; however worsening left thigh  pain -recommend bone scan for further evaluation.  # Hx of brain metastases- s/p GK in [place]; but new 2 mm lesion noted- STABLE on MRI April 16th 2019. 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  Continue gabapentin.  #Worsening fatigue-recommend increasing the dose of dexamethasone to 12 mg on days of chemotherapy.  # follow up in 4 weeks/labs/Tax-Cyramza;udenyca;  possible IVFs/steroids in 2 weeks.  Patient going to [place] on August 8 for restaging purposes.   Orders Placed This Encounter  Procedures  . NM Bone Scan Whole Body    Standing Status:   Future    Standing Expiration Date:   06/29/2018    Order Specific Question:   If indicated for the ordered procedure, I authorize the administration of a radiopharmaceutical per Radiology protocol    Answer:   Yes    Order Specific Question:   Preferred imaging location?    Answer:   [facility]    Order Specific Question:   Radiology Contrast Protocol - do NOT remove file path    Answer:   [email or web address]  . Comprehensive metabolic panel    Standing Status:   Future    Standing Expiration Date:   06/29/2018  . CBC with Differential    Standing Status:   Future    Standing Expiration Date:   06/29/2018  . Basic metabolic panel    Standing Status:   Future    Standing Expiration Date:   06/29/2018   All questions were answered. The patient knows to call the clinic with any problems, questions or concerns.      Cammie Sickle, MD 07/03/2017 10:54 PM

## 2017-07-05 ENCOUNTER — Encounter
Admission: RE | Admit: 2017-07-05 | Discharge: 2017-07-05 | Disposition: A | Discharge status: Home / Self Care | Payer: BLUE CROSS/BLUE SHIELD | Source: Ambulatory Visit | Attending: Internal Medicine | Admitting: Internal Medicine

## 2017-07-05 DIAGNOSIS — C3431 Malignant neoplasm of lower lobe, right bronchus or lung: Secondary | ICD-10-CM | POA: Insufficient documentation

## 2017-07-05 DIAGNOSIS — M898X5 Other specified disorders of bone, thigh: Secondary | ICD-10-CM | POA: Diagnosis present

## 2017-07-05 MED ORDER — TECHNETIUM TC 99M MEDRONATE IV KIT
23.4600 | PACK | Freq: Once | INTRAVENOUS | Status: AC | PRN
  Administered 2017-07-05: 23.46 via INTRAVENOUS

## 2017-07-11 ENCOUNTER — Other Ambulatory Visit: Payer: Self-pay | Admitting: Internal Medicine

## 2017-07-11 ENCOUNTER — Telehealth: Payer: Self-pay | Admitting: *Deleted

## 2017-07-11 DIAGNOSIS — C3431 Malignant neoplasm of lower lobe, right bronchus or lung: Secondary | ICD-10-CM

## 2017-07-11 MED ORDER — FENTANYL 25 MCG/HR TD PT72: 25.0000 ug | MEDICATED_PATCH | TRANSDERMAL | 0 refills | Status: DC

## 2017-07-11 MED ORDER — IBUPROFEN 400 MG PO TABS: 400.0000 mg | ORAL_TABLET | Freq: Three times a day (TID) | ORAL | 0 refills | Status: AC | PRN

## 2017-07-11 NOTE — Telephone Encounter (Signed)
Patient informed appts are correct and that she is not for treatment on the 10th , just IV fluids and steroids.

## 2017-07-11 NOTE — Telephone Encounter (Signed)
Patient called and states she thinks there is a mistake with her appointments, She said she is down for 7/10 for lab infusion, but thinks her inf is not until 7/24. She is actually scheduled for IV fluids and Dex on 7/10 and Cyramza on 7/24. Please clarify

## 2017-07-13 ENCOUNTER — Inpatient Hospital Stay: Payer: BLUE CROSS/BLUE SHIELD | Attending: Internal Medicine

## 2017-07-13 ENCOUNTER — Inpatient Hospital Stay: Payer: BLUE CROSS/BLUE SHIELD

## 2017-07-13 ENCOUNTER — Other Ambulatory Visit: Payer: Self-pay | Admitting: Internal Medicine

## 2017-07-13 VITALS — BP 117/74 | HR 60 | Temp 95.0°F | Resp 18

## 2017-07-13 DIAGNOSIS — R530 Neoplastic (malignant) related fatigue: Secondary | ICD-10-CM | POA: Insufficient documentation

## 2017-07-13 DIAGNOSIS — Z87891 Personal history of nicotine dependence: Secondary | ICD-10-CM | POA: Diagnosis not present

## 2017-07-13 DIAGNOSIS — G629 Polyneuropathy, unspecified: Secondary | ICD-10-CM | POA: Diagnosis not present

## 2017-07-13 DIAGNOSIS — C3431 Malignant neoplasm of lower lobe, right bronchus or lung: Secondary | ICD-10-CM | POA: Diagnosis present

## 2017-07-13 DIAGNOSIS — Z8249 Family history of ischemic heart disease and other diseases of the circulatory system: Secondary | ICD-10-CM | POA: Insufficient documentation

## 2017-07-13 DIAGNOSIS — Z5112 Encounter for antineoplastic immunotherapy: Secondary | ICD-10-CM | POA: Diagnosis not present

## 2017-07-13 DIAGNOSIS — C7931 Secondary malignant neoplasm of brain: Secondary | ICD-10-CM | POA: Insufficient documentation

## 2017-07-13 DIAGNOSIS — Z5111 Encounter for antineoplastic chemotherapy: Secondary | ICD-10-CM | POA: Insufficient documentation

## 2017-07-13 DIAGNOSIS — Z8042 Family history of malignant neoplasm of prostate: Secondary | ICD-10-CM | POA: Insufficient documentation

## 2017-07-13 DIAGNOSIS — E860 Dehydration: Secondary | ICD-10-CM

## 2017-07-13 DIAGNOSIS — Z79899 Other long term (current) drug therapy: Secondary | ICD-10-CM | POA: Insufficient documentation

## 2017-07-13 DIAGNOSIS — G893 Neoplasm related pain (acute) (chronic): Secondary | ICD-10-CM | POA: Insufficient documentation

## 2017-07-13 DIAGNOSIS — K1231 Oral mucositis (ulcerative) due to antineoplastic therapy: Secondary | ICD-10-CM

## 2017-07-13 LAB — BASIC METABOLIC PANEL
Anion gap: 10 (ref 5–15)
BUN: 13 mg/dL (ref 8–23)
CHLORIDE: 108 mmol/L (ref 98–111)
CO2: 22 mmol/L (ref 22–32)
Calcium: 9.2 mg/dL (ref 8.9–10.3)
Creatinine, Ser: 0.64 mg/dL (ref 0.44–1.00)
GFR calc non Af Amer: >60 mL/min (ref >60)
Glucose, Bld: 102 mg/dL — ABNORMAL HIGH (ref 70–99)
POTASSIUM: 4.1 mmol/L (ref 3.5–5.1)
Sodium: 140 mmol/L (ref 135–145)

## 2017-07-13 MED ORDER — HEPARIN SOD (PORK) LOCK FLUSH 100 UNIT/ML IV SOLN
500.0000 [IU] | Freq: Once | INTRAVENOUS | Status: AC | PRN
  Administered 2017-07-13: 500 [IU]
  Filled 2017-07-13: qty 5

## 2017-07-13 MED ORDER — SODIUM CHLORIDE 0.9 % IV SOLN
Freq: Once | INTRAVENOUS | Status: AC
  Administered 2017-07-13: 09:00:00 via INTRAVENOUS
  Filled 2017-07-13: qty 1000

## 2017-07-13 MED ORDER — DEXAMETHASONE SODIUM PHOSPHATE 10 MG/ML IJ SOLN
10.0000 mg | Freq: Once | INTRAMUSCULAR | Status: AC
  Administered 2017-07-13: 10 mg via INTRAVENOUS
  Filled 2017-07-13: qty 1

## 2017-07-13 MED ORDER — ONDANSETRON HCL 4 MG/2ML IJ SOLN: 8.0000 mg | Freq: Once | INTRAMUSCULAR | Status: DC

## 2017-07-13 MED ORDER — SODIUM CHLORIDE 0.9 % IV SOLN: 10.0000 mg | Freq: Once | INTRAVENOUS | Status: DC

## 2017-07-13 MED ORDER — SODIUM CHLORIDE 0.9 % IV SOLN: Freq: Once | INTRAVENOUS | Status: DC

## 2017-07-14 MED ORDER — LIDOCAINE HCL (PF) 2 % IJ SOLN
INTRAMUSCULAR | Status: AC
  Filled 2017-07-14: qty 10

## 2017-07-14 MED ORDER — DEXAMETHASONE SODIUM PHOSPHATE 10 MG/ML IJ SOLN
INTRAMUSCULAR | Status: AC
  Filled 2017-07-14: qty 1

## 2017-07-14 MED ORDER — ONDANSETRON HCL 4 MG/2ML IJ SOLN
INTRAMUSCULAR | Status: AC
  Filled 2017-07-14: qty 2

## 2017-07-14 MED ORDER — PROPOFOL 10 MG/ML IV BOLUS
INTRAVENOUS | Status: AC
  Filled 2017-07-14: qty 20

## 2017-07-14 MED ORDER — FENTANYL CITRATE (PF) 100 MCG/2ML IJ SOLN
INTRAMUSCULAR | Status: AC
  Filled 2017-07-14: qty 2

## 2017-07-14 MED ORDER — MIDAZOLAM HCL 2 MG/2ML IJ SOLN
INTRAMUSCULAR | Status: AC
  Filled 2017-07-14: qty 2

## 2017-07-14 MED ORDER — ROCURONIUM BROMIDE 50 MG/5ML IV SOLN
INTRAVENOUS | Status: AC
  Filled 2017-07-14: qty 1

## 2017-07-18 ENCOUNTER — Other Ambulatory Visit: Payer: Self-pay | Admitting: Internal Medicine

## 2017-07-18 DIAGNOSIS — C3431 Malignant neoplasm of lower lobe, right bronchus or lung: Secondary | ICD-10-CM

## 2017-07-18 DIAGNOSIS — G893 Neoplasm related pain (acute) (chronic): Secondary | ICD-10-CM

## 2017-07-19 MED ORDER — OXYCODONE HCL 10 MG PO TABS: 10.0000 mg | ORAL_TABLET | Freq: Four times a day (QID) | ORAL | 0 refills | Status: DC | PRN

## 2017-07-27 ENCOUNTER — Inpatient Hospital Stay (HOSPITAL_BASED_OUTPATIENT_CLINIC_OR_DEPARTMENT_OTHER): Payer: BLUE CROSS/BLUE SHIELD | Admitting: Internal Medicine

## 2017-07-27 ENCOUNTER — Encounter: Payer: Self-pay | Admitting: Internal Medicine

## 2017-07-27 ENCOUNTER — Inpatient Hospital Stay: Payer: BLUE CROSS/BLUE SHIELD

## 2017-07-27 ENCOUNTER — Other Ambulatory Visit: Payer: Self-pay | Admitting: Internal Medicine

## 2017-07-27 ENCOUNTER — Other Ambulatory Visit: Payer: Self-pay

## 2017-07-27 VITALS — BP 102/67

## 2017-07-27 DIAGNOSIS — Z5112 Encounter for antineoplastic immunotherapy: Secondary | ICD-10-CM | POA: Diagnosis not present

## 2017-07-27 DIAGNOSIS — C3431 Malignant neoplasm of lower lobe, right bronchus or lung: Secondary | ICD-10-CM

## 2017-07-27 DIAGNOSIS — R530 Neoplastic (malignant) related fatigue: Secondary | ICD-10-CM | POA: Diagnosis not present

## 2017-07-27 DIAGNOSIS — G893 Neoplasm related pain (acute) (chronic): Secondary | ICD-10-CM

## 2017-07-27 DIAGNOSIS — E860 Dehydration: Secondary | ICD-10-CM

## 2017-07-27 DIAGNOSIS — C7931 Secondary malignant neoplasm of brain: Secondary | ICD-10-CM | POA: Diagnosis not present

## 2017-07-27 DIAGNOSIS — Z8042 Family history of malignant neoplasm of prostate: Secondary | ICD-10-CM

## 2017-07-27 DIAGNOSIS — Z79899 Other long term (current) drug therapy: Secondary | ICD-10-CM

## 2017-07-27 DIAGNOSIS — G629 Polyneuropathy, unspecified: Secondary | ICD-10-CM

## 2017-07-27 DIAGNOSIS — Z87891 Personal history of nicotine dependence: Secondary | ICD-10-CM

## 2017-07-27 DIAGNOSIS — Z8249 Family history of ischemic heart disease and other diseases of the circulatory system: Secondary | ICD-10-CM

## 2017-07-27 DIAGNOSIS — J449 Chronic obstructive pulmonary disease, unspecified: Secondary | ICD-10-CM

## 2017-07-27 LAB — COMPREHENSIVE METABOLIC PANEL
ALT: 9 U/L (ref 0–44)
AST: 25 U/L (ref 15–41)
Albumin: 3.3 g/dL — ABNORMAL LOW (ref 3.5–5.0)
Alkaline Phosphatase: 42 U/L (ref 38–126)
Anion gap: 11 (ref 5–15)
BUN: 10 mg/dL (ref 8–23)
CO2: 22 mmol/L (ref 22–32)
CREATININE: 0.58 mg/dL (ref 0.44–1.00)
Calcium: 9.1 mg/dL (ref 8.9–10.3)
Chloride: 103 mmol/L (ref 98–111)
GFR calc Af Amer: >60 mL/min (ref >60)
GFR calc non Af Amer: >60 mL/min (ref >60)
Glucose, Bld: 108 mg/dL — ABNORMAL HIGH (ref 70–99)
Potassium: 3.6 mmol/L (ref 3.5–5.1)
SODIUM: 136 mmol/L (ref 135–145)
Total Bilirubin: 0.8 mg/dL (ref 0.3–1.2)
Total Protein: 6.4 g/dL — ABNORMAL LOW (ref 6.5–8.1)

## 2017-07-27 LAB — URINALYSIS, COMPLETE (UACMP) WITH MICROSCOPIC
Bacteria, UA: NONE SEEN
Bilirubin Urine: NEGATIVE
GLUCOSE, UA: NEGATIVE mg/dL
Hgb urine dipstick: NEGATIVE
Ketones, ur: NEGATIVE mg/dL
Leukocytes, UA: NEGATIVE
NITRITE: NEGATIVE
PH: 6 (ref 5.0–8.0)
Protein, ur: NEGATIVE mg/dL
Specific Gravity, Urine: 1.009 (ref 1.005–1.030)

## 2017-07-27 LAB — CBC WITH DIFFERENTIAL/PLATELET
BASOS ABS: 0.1 10*3/uL (ref 0–0.1)
Basophils Relative: 1 %
EOS PCT: 0 %
Eosinophils Absolute: 0 10*3/uL (ref 0–0.7)
HCT: 35.7 % (ref 35.0–47.0)
Hemoglobin: 12.1 g/dL (ref 12.0–16.0)
LYMPHS PCT: 14 %
Lymphs Abs: 1.6 10*3/uL (ref 1.0–3.6)
MCH: 35.3 pg — ABNORMAL HIGH (ref 26.0–34.0)
MCHC: 33.9 g/dL (ref 32.0–36.0)
MCV: 104.1 fL — ABNORMAL HIGH (ref 80.0–100.0)
MONO ABS: 1 10*3/uL — AB (ref 0.2–0.9)
Monocytes Relative: 9 %
Neutro Abs: 8.6 10*3/uL — ABNORMAL HIGH (ref 1.4–6.5)
Neutrophils Relative %: 76 %
Platelets: 270 10*3/uL (ref 150–440)
RBC: 3.43 MIL/uL — AB (ref 3.80–5.20)
RDW: 16.9 % — ABNORMAL HIGH (ref 11.5–14.5)
WBC: 11.2 10*3/uL — AB (ref 3.6–11.0)

## 2017-07-27 MED ORDER — DEXAMETHASONE SODIUM PHOSPHATE 100 MG/10ML IJ SOLN
Freq: Once | INTRAMUSCULAR | Status: AC
  Administered 2017-07-27: 10:00:00 via INTRAVENOUS
  Filled 2017-07-27: qty 1.2

## 2017-07-27 MED ORDER — SODIUM CHLORIDE 0.9 % IV SOLN
Freq: Once | INTRAVENOUS | Status: AC
  Administered 2017-07-27: 09:00:00 via INTRAVENOUS
  Filled 2017-07-27: qty 1000

## 2017-07-27 MED ORDER — DIPHENHYDRAMINE HCL 50 MG/ML IJ SOLN
50.0000 mg | Freq: Once | INTRAMUSCULAR | Status: AC
  Administered 2017-07-27: 50 mg via INTRAVENOUS
  Filled 2017-07-27: qty 1

## 2017-07-27 MED ORDER — RAMUCIRUMAB CHEMO INJECTION 500 MG/50ML
530.0000 mg | Freq: Once | INTRAVENOUS | Status: AC
  Administered 2017-07-27: 530 mg via INTRAVENOUS
  Filled 2017-07-27: qty 10

## 2017-07-27 MED ORDER — HEPARIN SOD (PORK) LOCK FLUSH 100 UNIT/ML IV SOLN
500.0000 [IU] | Freq: Once | INTRAVENOUS | Status: AC | PRN
  Administered 2017-07-27: 500 [IU]
  Filled 2017-07-27: qty 5

## 2017-07-27 MED ORDER — DEXAMETHASONE SODIUM PHOSPHATE 10 MG/ML IJ SOLN: 12.0000 mg | Freq: Once | INTRAMUSCULAR | Status: DC

## 2017-07-27 MED ORDER — SODIUM CHLORIDE 0.9 % IV SOLN
60.0000 mg/m2 | Freq: Once | INTRAVENOUS | Status: AC
  Administered 2017-07-27: 100 mg via INTRAVENOUS
  Filled 2017-07-27: qty 10

## 2017-07-27 MED ORDER — ACETAMINOPHEN 325 MG PO TABS
650.0000 mg | ORAL_TABLET | Freq: Once | ORAL | Status: AC
  Administered 2017-07-27: 650 mg via ORAL
  Filled 2017-07-27: qty 2

## 2017-07-27 NOTE — Progress Notes (Signed)
[place] OFFICE PROGRESS NOTE  Patient Care Team: Deanna Blair as PCP - General (Family Medicine) Allyne Gee, MD as Referring Physician (Internal Medicine)  Cancer Staging No matching staging information was found for the patient.   Oncology History   # FEB 2017-[place] Right Lower Lung T4N2M0- STAGE IIIB [Bronch & Subcarinal LNBx]- March 8th START CARBO-ALIMTA x4 cycles- June 8th 2017 - IMPROVED FDG MULTI-FOCAL lesions/N2-LN activity. S/p [place]-- Alimta x6  # AUG 18th- [place]- Avastin maintenance; SEP 13th DISCONTINUE AVASTIN [sec to cavitary lesion]; OCT 20th 2017- CT-STABLE Disease; NOV 8th CT/PET [facility]]- "overall slight progression-? 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 The main right frontal lesion gotten smaller; improve edema.  # Jan 2019- Progression [CT chest; [facility]]; Brain MRI- new sub cm lesions- monitored  # feb 14th 2019- Tax-Cyram   --------------------------------------------------------------     MOLECULAR STUDIES:  KRAS MUTATED/Tissues not sufficient for OTHER the molecular marker; will need repeat Bx ; GUARDIANT TESTING [[facility]]- pending. # [phone][id number]   ------------------------------------------------------   DIAGNOSIS: [ FEB 2017]- [place] CA LUNG  STAGE:   IV      ;GOALS: palliative  CURRENT/MOST RECENT THERAPY- Tax-Ram      Primary cancer of right lower lobe of lung ([place]     INTERVAL HISTORY:  Deanna Happ89.o.  female pleasant patient above history of metastatic adenocarcinoma the lung on Taxotere-Cyramza is here for follow-up.    Patient continues to complain of intermittent pains bilateral lower extremities-left thigh; currently right thigh.  Comes and goes.  Worse in the morning.    Review of Systems  Constitutional: Positive for malaise/fatigue. Negative for chills, diaphoresis, fever and weight loss.  HENT: Negative for nosebleeds and sore throat.   Eyes: Negative for double vision.  Respiratory: Negative for cough, hemoptysis, sputum production, shortness of breath and wheezing.   Cardiovascular: Negative for chest pain, palpitations, orthopnea and leg swelling.  Gastrointestinal: Negative for abdominal pain, blood in stool, constipation, diarrhea, heartburn, melena, nausea and vomiting.  Genitourinary: Negative for dysuria, frequency and urgency.  Musculoskeletal: Positive for back pain, joint pain and myalgias.  Skin: Negative.  Negative for itching and rash.  Neurological: Negative for dizziness, tingling, focal weakness, weakness and headaches.  Endo/Heme/Allergies: Does not bruise/bleed easily.  Psychiatric/Behavioral: Negative for depression. The patient is not nervous/anxious and does not have insomnia.       PAST MEDICAL HISTORY :  Past Medical History:  Diagnosis Date  . Anxiety   . Cancer of right lung ([place]2/09/2015, 01/13/17  . Depression   . Depression with anxiety    Well controlled with Wellbutrin and Buspar  . Pneumonia     PAST SURGICAL HISTORY :   Past Surgical History:  Procedure  Laterality Date  . ECTOPIC PREGNANCY SURGERY  1988  . ELECTROMAGNETIC NAVIGATION BROCHOSCOPY  N/A 02/10/2015   Procedure: ELECTROMAGNETIC NAVIGATION BRONCHOSCOPY;  Surgeon: Flora Lipps, MD;  Location: [facility] ORS;  Service: Cardiopulmonary;  Laterality: N/A;  . ENDOBRONCHIAL ULTRASOUND N/A 02/10/2015   Procedure: ENDOBRONCHIAL ULTRASOUND;  Surgeon: Flora Lipps, MD;  Location: [facility] ORS;  Service: Cardiopulmonary;  Laterality: N/A;  . PERIPHERAL VASCULAR CATHETERIZATION N/A 03/05/2015   Procedure: Glori Luis Cath Insertion;  Surgeon: Katha Cabal, MD;  Location: [place] [facility] LAB;  Service: Cardiovascular;  Laterality: N/A;    FAMILY HISTORY :   Family History  Problem Relation Age of Onset  . Cancer Father        Prostate Cancer  . Hypertension Father   . Stroke Father   . Hypertension Brother     SOCIAL HISTORY:   Social History   Tobacco Use  . Smoking status: Former Smoker    Packs/day: 0.25    Years: 30.00    Pack years: 7.50    Types: Cigarettes    Last attempt to quit: 02/06/2005    Years since quitting: 12.4  . Smokeless tobacco: Never Used  Substance Use Topics  . Alcohol use: Yes    Comment: not drank any in several months  . Drug use: No    ALLERGIES:  has No Known Allergies.  MEDICATIONS:  Current Outpatient Medications  Medication Sig Dispense Refill  . acetaminophen (TYLENOL) 500 MG tablet Take 1,000 mg by mouth every 4 (four) hours as needed.     [name] Kitchen dexamethasone (DECADRON) 4 MG tablet Take 1 tablet (4 mg total) by mouth 2 (two) times daily with a meal. Start the day prior to chemo; for 3 days. 60 tablet 3  . docusate sodium (COLACE) 100 MG capsule Take 100 mg by mouth 2 (two) times daily.    . fentaNYL (DURAGESIC - DOSED MCG/HR) 25 MCG/HR patch Place 1 patch (25 mcg total) onto the skin every 3 (three) days. 10 patch 0  . ibuprofen (IBU) 400 MG tablet Take 1 tablet (400 mg total) by mouth 3 (three) times daily as needed. 30 tablet 0  . lidocaine-prilocaine (EMLA) cream Apply 1 application topically as needed. 30 g 6  . magic mouthwash w/lidocaine SOLN Take  5 mLs by mouth 4 (four) times daily. 480 mL 3  . ondansetron (ZOFRAN) 8 MG tablet Take 1 tablet (8 mg total) by mouth every 8 (eight) hours as needed for nausea or vomiting (start 3 days; after chemo). 40 tablet 1  . Oxycodone HCl 10 MG TABS Take 1 tablet (10 mg total) by mouth every 6 (six) hours as needed (moderate to severe pain). 60 tablet 0  . polyethylene glycol (MIRALAX / GLYCOLAX) packet Take 17 g by mouth daily as needed.    . prochlorperazine (COMPAZINE) 10 MG tablet Take 1 tablet (10 mg total) by mouth every 6 (six) hours as needed for nausea or vomiting. 25 tablet 1  . zolpidem (AMBIEN) 5 MG tablet TAKE 1 TABLET BY MOUTH AT BEDTIME AS NEEDED FOR SLEEP 30 tablet 2  . Fluticasone-Umeclidin-Vilant (TRELEGY ELLIPTA) 100-62.5-25 MCG/INH AEPB Inhale 1 puff into the lungs daily. 1 each 4  . TRELEGY ELLIPTA 100-62.5-25 MCG/INH AEPB INHALE 1 PUFF ONCE DAILY 60 each 4   No current facility-administered medications for this visit.    Facility-Administered Medications Ordered in Other Visits  Medication Dose Route Frequency Provider Last Rate Last Dose  . 0.9 %  sodium chloride infusion  Intravenous Continuous Jacquelin Hawking, NP      . dexamethasone (DECADRON) injection 10 mg  10 mg Intravenous Once Jacquelin Hawking, NP        PHYSICAL EXAMINATION: ECOG PERFORMANCE STATUS: 1 - Symptomatic but completely ambulatory  BP 99/67 (BP Location: Left Arm, Patient Position: Sitting)   Pulse 73   Temp 97.9 F (36.6 C) (Tympanic)   Resp 20   Ht _0  (1.6 m)   Wt 116 lb (52.6 kg)   LMP 06/16/2000 (Approximate) Comment: 15 yrs ago  BMI 20.55 kg/m   Filed Weights   07/27/17 0831  Weight: 116 lb (52.6 kg)    GENERAL: Well-nourished well-developed; Alert, no distress and comfortable.  Accompanied by husband.  EYES: no pallor or icterus OROPHARYNX: no thrush or ulceration; NECK: supple; no lymph nodes felt. LYMPH:  no palpable lymphadenopathy in the axillary or inguinal regions LUNGS:  Decreased breath sounds auscultation bilaterally. No wheeze or crackles HEART/CVS: regular rate & rhythm and no murmurs; No lower extremity edema ABDOMEN:abdomen soft, non-tender and normal bowel sounds. No hepatomegaly or splenomegaly.  Musculoskeletal:no cyanosis of digits and no clubbing  PSYCH: alert & oriented x 3 with fluent speech NEURO: no focal motor/sensory deficits SKIN:  no rashes or significant lesions    LABORATORY DATA:  I have reviewed the data as listed    Component Value Date/Time   NA 136 07/27/2017 0808   NA 139 01/03/2015 0910   K 3.6 07/27/2017 0808   CL 103 07/27/2017 0808   CO2 22 07/27/2017 0808   GLUCOSE 108 (H) 07/27/2017 0808   BUN 10 07/27/2017 0808   BUN 10 01/03/2015 0910   CREATININE 0.58 07/27/2017 0808   CALCIUM 9.1 07/27/2017 0808   PROT 6.4 (L) 07/27/2017 0808   PROT 6.5 01/03/2015 0910   ALBUMIN 3.3 (L) 07/27/2017 0808   ALBUMIN 3.8 01/03/2015 0910   AST 25 07/27/2017 0808   ALT 9 07/27/2017 0808   ALKPHOS 42 07/27/2017 0808   BILITOT 0.8 07/27/2017 0808   BILITOT <0.2 01/03/2015 0910   GFRNONAA >60 07/27/2017 0808   GFRAA >60 07/27/2017 0808    No results found for: SPEP, UPEP  Lab Results  Component Value Date   WBC 11.2 (H) 07/27/2017   NEUTROABS 8.6 (H) 07/27/2017   HGB 12.1 07/27/2017   HCT 35.7 07/27/2017   MCV 104.1 (H) 07/27/2017   PLT 270 07/27/2017      Chemistry      Component Value Date/Time   NA 136 07/27/2017 0808   NA 139 01/03/2015 0910   K 3.6 07/27/2017 0808   CL 103 07/27/2017 0808   CO2 22 07/27/2017 0808   BUN 10 07/27/2017 0808   BUN 10 01/03/2015 0910   CREATININE 0.58 07/27/2017 0808      Component Value Date/Time   CALCIUM 9.1 07/27/2017 0808   ALKPHOS 42 07/27/2017 0808   AST 25 07/27/2017 0808   ALT 9 07/27/2017 0808   BILITOT 0.8 07/27/2017 0808   BILITOT <0.2 01/03/2015 0910       RADIOGRAPHIC STUDIES: I have personally reviewed the radiological images as listed and agreed with  the findings in the report. No results found.   ASSESSMENT & PLAN:  Primary cancer of right lower lobe of lung ([place]) # Metastatic adenocarcinoma the lung-currently on Taxotere Cyramza; improving.  CT [facility] partial response.  # Proceed with Taxotere [60 mg/m] Cyramza; every 4 weeks;Labs today reviewed;  acceptable for treatment today.   #  Left thigh pain/right thigh pain- ?sec to chemo/Tax; continue fentanyl patch oxycodone; bone scan-NEG.   # Hx of brain metastases- s/p GK in [place]; but new 2 mm lesion noted- STABLE on MRI April 16th 2019. [[facility]]  #Peripheral neuropathy grade 1-2 ; STABLE. Continue gabapentin.  # follow up in 4 weeks/labs/Tax-Cyramza; possible IVFs/steroids in aug 5th. Patient going to [place] on August 6th for restaging purposes.   Orders Placed This Encounter  Procedures  . CBC with Differential    Standing Status:   Future    Standing Expiration Date:   07/28/2018  . Comprehensive metabolic panel    Standing Status:   Future    Standing Expiration Date:   07/28/2018  . Urinalysis, Complete w Microscopic    Standing Status:   Future    Standing Expiration Date:   07/28/2018   All questions were answered. The patient knows to call the clinic with any problems, questions or concerns.      Cammie Sickle, MD 07/29/2017 7:43 AM

## 2017-07-27 NOTE — Assessment & Plan Note (Addendum)
#   Metastatic adenocarcinoma the lung-currently on Taxotere Cyramza; improving.  CT scan-May 2019/[facility] partial response.  # Proceed with Taxotere [60 mg/m] Cyramza; every 4 weeks;Labs today reviewed;  acceptable for treatment today.   # Left thigh pain/right thigh pain- ?sec to chemo/Tax; continue fentanyl patch oxycodone; bone scan-NEG.   # Hx of brain metastases- s/p GK in [place]; but new 2 mm lesion noted- STABLE on MRI April 16th 2019. [[facility]]  #Peripheral neuropathy grade 1-2 ; STABLE. Continue gabapentin.  # follow up in 4 weeks/labs/Tax-Cyramza; possible IVFs/steroids in aug 5th. Patient going to [place] on August 6th for restaging purposes.

## 2017-07-28 ENCOUNTER — Other Ambulatory Visit: Payer: Self-pay

## 2017-07-28 DIAGNOSIS — J449 Chronic obstructive pulmonary disease, unspecified: Secondary | ICD-10-CM

## 2017-07-28 MED ORDER — FLUTICASONE-UMECLIDIN-VILANT 100-62.5-25 MCG/INH IN AEPB: 1.0000 | INHALATION_SPRAY | Freq: Every day | RESPIRATORY_TRACT | 4 refills | Status: AC

## 2017-08-04 ENCOUNTER — Other Ambulatory Visit: Payer: Self-pay | Admitting: Internal Medicine

## 2017-08-04 DIAGNOSIS — C3431 Malignant neoplasm of lower lobe, right bronchus or lung: Secondary | ICD-10-CM

## 2017-08-04 DIAGNOSIS — G893 Neoplasm related pain (acute) (chronic): Secondary | ICD-10-CM

## 2017-08-04 MED ORDER — OXYCODONE HCL 10 MG PO TABS: 10.0000 mg | ORAL_TABLET | Freq: Four times a day (QID) | ORAL | 0 refills | Status: DC | PRN

## 2017-08-04 MED ORDER — FENTANYL 25 MCG/HR TD PT72: 25.0000 ug | MEDICATED_PATCH | TRANSDERMAL | 0 refills | Status: DC

## 2017-08-08 ENCOUNTER — Inpatient Hospital Stay: Payer: BLUE CROSS/BLUE SHIELD | Attending: Internal Medicine

## 2017-08-08 VITALS — BP 106/71 | HR 66 | Temp 97.6°F | Resp 20

## 2017-08-08 DIAGNOSIS — Z87891 Personal history of nicotine dependence: Secondary | ICD-10-CM | POA: Insufficient documentation

## 2017-08-08 DIAGNOSIS — R530 Neoplastic (malignant) related fatigue: Secondary | ICD-10-CM | POA: Insufficient documentation

## 2017-08-08 DIAGNOSIS — F418 Other specified anxiety disorders: Secondary | ICD-10-CM | POA: Insufficient documentation

## 2017-08-08 DIAGNOSIS — G620 Drug-induced polyneuropathy: Secondary | ICD-10-CM | POA: Insufficient documentation

## 2017-08-08 DIAGNOSIS — Z5111 Encounter for antineoplastic chemotherapy: Secondary | ICD-10-CM | POA: Diagnosis not present

## 2017-08-08 DIAGNOSIS — R112 Nausea with vomiting, unspecified: Secondary | ICD-10-CM | POA: Insufficient documentation

## 2017-08-08 DIAGNOSIS — C7931 Secondary malignant neoplasm of brain: Secondary | ICD-10-CM | POA: Diagnosis not present

## 2017-08-08 DIAGNOSIS — Z79899 Other long term (current) drug therapy: Secondary | ICD-10-CM | POA: Diagnosis not present

## 2017-08-08 DIAGNOSIS — K1231 Oral mucositis (ulcerative) due to antineoplastic therapy: Secondary | ICD-10-CM

## 2017-08-08 DIAGNOSIS — C3431 Malignant neoplasm of lower lobe, right bronchus or lung: Secondary | ICD-10-CM | POA: Insufficient documentation

## 2017-08-08 MED ORDER — SODIUM CHLORIDE 0.9 % IV SOLN: 10.0000 mg | Freq: Once | INTRAVENOUS | Status: DC

## 2017-08-08 MED ORDER — DEXAMETHASONE SODIUM PHOSPHATE 10 MG/ML IJ SOLN
10.0000 mg | Freq: Once | INTRAMUSCULAR | Status: AC
  Administered 2017-08-08: 10 mg via INTRAVENOUS
  Filled 2017-08-08: qty 1

## 2017-08-08 MED ORDER — HEPARIN SOD (PORK) LOCK FLUSH 100 UNIT/ML IV SOLN
500.0000 [IU] | Freq: Once | INTRAVENOUS | Status: AC | PRN
  Administered 2017-08-08: 500 [IU]
  Filled 2017-08-08: qty 5

## 2017-08-08 MED ORDER — SODIUM CHLORIDE 0.9 % IV SOLN
Freq: Once | INTRAVENOUS | Status: AC
  Administered 2017-08-08: 14:00:00 via INTRAVENOUS
  Filled 2017-08-08: qty 1000

## 2017-08-08 MED ORDER — SODIUM CHLORIDE 0.9% FLUSH
10.0000 mL | INTRAVENOUS | Status: DC | PRN
  Administered 2017-08-08: 10 mL
  Filled 2017-08-08: qty 10

## 2017-08-23 ENCOUNTER — Other Ambulatory Visit: Payer: Self-pay | Admitting: Internal Medicine

## 2017-08-23 DIAGNOSIS — G893 Neoplasm related pain (acute) (chronic): Secondary | ICD-10-CM

## 2017-08-23 DIAGNOSIS — C3431 Malignant neoplasm of lower lobe, right bronchus or lung: Secondary | ICD-10-CM

## 2017-08-23 MED ORDER — OXYCODONE HCL 10 MG PO TABS: 10.0000 mg | ORAL_TABLET | Freq: Four times a day (QID) | ORAL | 0 refills | Status: DC | PRN

## 2017-08-24 ENCOUNTER — Inpatient Hospital Stay (HOSPITAL_BASED_OUTPATIENT_CLINIC_OR_DEPARTMENT_OTHER): Payer: BLUE CROSS/BLUE SHIELD | Admitting: Internal Medicine

## 2017-08-24 ENCOUNTER — Inpatient Hospital Stay: Payer: BLUE CROSS/BLUE SHIELD

## 2017-08-24 ENCOUNTER — Encounter: Payer: Self-pay | Admitting: Internal Medicine

## 2017-08-24 VITALS — BP 133/86 | HR 101 | Temp 98.1°F | Resp 16 | Wt 112.0 lb

## 2017-08-24 DIAGNOSIS — C7931 Secondary malignant neoplasm of brain: Secondary | ICD-10-CM | POA: Diagnosis not present

## 2017-08-24 DIAGNOSIS — R112 Nausea with vomiting, unspecified: Secondary | ICD-10-CM

## 2017-08-24 DIAGNOSIS — R530 Neoplastic (malignant) related fatigue: Secondary | ICD-10-CM | POA: Diagnosis not present

## 2017-08-24 DIAGNOSIS — G620 Drug-induced polyneuropathy: Secondary | ICD-10-CM

## 2017-08-24 DIAGNOSIS — C3431 Malignant neoplasm of lower lobe, right bronchus or lung: Secondary | ICD-10-CM | POA: Diagnosis not present

## 2017-08-24 DIAGNOSIS — Z79899 Other long term (current) drug therapy: Secondary | ICD-10-CM

## 2017-08-24 DIAGNOSIS — Z5111 Encounter for antineoplastic chemotherapy: Secondary | ICD-10-CM | POA: Diagnosis not present

## 2017-08-24 DIAGNOSIS — F418 Other specified anxiety disorders: Secondary | ICD-10-CM

## 2017-08-24 DIAGNOSIS — Z87891 Personal history of nicotine dependence: Secondary | ICD-10-CM

## 2017-08-24 LAB — CBC WITH DIFFERENTIAL/PLATELET
BASOS PCT: 1 %
Basophils Absolute: 0.1 10*3/uL (ref 0–0.1)
Eosinophils Absolute: 0.2 10*3/uL (ref 0–0.7)
Eosinophils Relative: 2 %
HCT: 33.7 % — ABNORMAL LOW (ref 35.0–47.0)
Hemoglobin: 11.5 g/dL — ABNORMAL LOW (ref 12.0–16.0)
Lymphocytes Relative: 14 %
Lymphs Abs: 1.2 10*3/uL (ref 1.0–3.6)
MCH: 34.2 pg — ABNORMAL HIGH (ref 26.0–34.0)
MCHC: 34.2 g/dL (ref 32.0–36.0)
MCV: 100.1 fL — ABNORMAL HIGH (ref 80.0–100.0)
MONOS PCT: 14 %
Monocytes Absolute: 1.2 10*3/uL — ABNORMAL HIGH (ref 0.2–0.9)
Neutro Abs: 5.8 10*3/uL (ref 1.4–6.5)
Neutrophils Relative %: 69 %
PLATELETS: 372 10*3/uL (ref 150–440)
RBC: 3.37 MIL/uL — ABNORMAL LOW (ref 3.80–5.20)
RDW: 17.7 % — ABNORMAL HIGH (ref 11.5–14.5)
WBC: 8.4 10*3/uL (ref 3.6–11.0)

## 2017-08-24 LAB — URINALYSIS, COMPLETE (UACMP) WITH MICROSCOPIC
Bilirubin Urine: NEGATIVE
Glucose, UA: NEGATIVE mg/dL
HGB URINE DIPSTICK: NEGATIVE
Ketones, ur: NEGATIVE mg/dL
LEUKOCYTES UA: NEGATIVE
Nitrite: NEGATIVE
PH: 6 (ref 5.0–8.0)
Protein, ur: 30 mg/dL — AB
SPECIFIC GRAVITY, URINE: 1.016 (ref 1.005–1.030)

## 2017-08-24 LAB — COMPREHENSIVE METABOLIC PANEL
ALBUMIN: 3.3 g/dL — AB (ref 3.5–5.0)
ALK PHOS: 49 U/L (ref 38–126)
ALT: 8 U/L (ref 0–44)
AST: 25 U/L (ref 15–41)
Anion gap: 9 (ref 5–15)
BUN: 9 mg/dL (ref 8–23)
CALCIUM: 9.1 mg/dL (ref 8.9–10.3)
CO2: 23 mmol/L (ref 22–32)
CREATININE: 0.61 mg/dL (ref 0.44–1.00)
Chloride: 105 mmol/L (ref 98–111)
GFR calc non Af Amer: >60 mL/min (ref >60)
GLUCOSE: 128 mg/dL — AB (ref 70–99)
Potassium: 3.6 mmol/L (ref 3.5–5.1)
SODIUM: 137 mmol/L (ref 135–145)
Total Bilirubin: 0.7 mg/dL (ref 0.3–1.2)
Total Protein: 6.9 g/dL (ref 6.5–8.1)

## 2017-08-24 MED ORDER — SODIUM CHLORIDE 0.9% FLUSH
10.0000 mL | Freq: Once | INTRAVENOUS | Status: AC
  Administered 2017-08-24: 10 mL via INTRAVENOUS
  Filled 2017-08-24: qty 10

## 2017-08-24 MED ORDER — LORAZEPAM 0.5 MG PO TABS: 0.5000 mg | ORAL_TABLET | Freq: Two times a day (BID) | ORAL | 0 refills | Status: DC

## 2017-08-24 MED ORDER — HEPARIN SOD (PORK) LOCK FLUSH 100 UNIT/ML IV SOLN
500.0000 [IU] | Freq: Once | INTRAVENOUS | Status: AC
  Administered 2017-08-24: 500 [IU] via INTRAVENOUS
  Filled 2017-08-24: qty 5

## 2017-08-24 NOTE — Assessment & Plan Note (Addendum)
#   Metastatic adenocarcinoma the lung-currently on Taxotere Cyramza [since march 2019]; AUG 8th 2019 [[facility]] CT scan-slightly increased in multi-focal adeno ca.   #Discussed the option of continued Tax-Ram-as recommended by [facility].  However patient has significant concerns about the side effects [myalgias/nausea vomiting/worsening neuropathy from the current chemotherapy.].  #  Discussed option of gemcitabine day 1 day 8 every 21 days.  Patient interested in gemcitabine chemotherapy.  Discussed the potential side effects.   #Brain mets status post [facility]; right parietal 4-5 mm lesion slightly bigger/stable overall.  Continue monitoring.  #Peripheral neuropathy grade 2; worse as per patient.  Continue gabapentin.  # Intermittent nausea vomiting-question anticipatory.  Recommend Ativan 0.5 mg twice a day as needed  # HOLD  Chemo today; DE-ACCESS; start [place] [Vacation- Sep23rd- 1st oct  ]  #Chemotherapy gemcitabine no labs 1 week; follow-up with me 2 weeks gemcitabine chemotherapy.   # 40 minutes face-to-face with the patient discussing the above plan of care; more than 50% of time spent on prognosis/ natural history; counseling and coordination.  Cc: Dr>leventakos.

## 2017-08-24 NOTE — Progress Notes (Signed)
[place] OFFICE PROGRESS NOTE  Patient Care Team: Rubye Beach as PCP - General (Family Medicine) Allyne Gee, MD as Referring Physician (Internal Medicine)  Cancer Staging No matching staging information was found for the patient.   Oncology History   # FEB 2017-ADENO CA Right Lower Lung T4N2M0- STAGE IIIB [Bronch & Subcarinal LNBx]- March 8th START CARBO-ALIMTA x4 cycles- June 8th 2017 - IMPROVED FDG MULTI-FOCAL lesions/N2-LN activity. S/p [place]-- Alimta x6  # AUG 18th- Alimta- Avastin maintenance; SEP 13th DISCONTINUE AVASTIN [sec to cavitary lesion]; OCT 20th 2017- CT-STABLE Disease; NOV 8th CT/PET [facility] clinic]- "overall slight progression-? Lymphangiectatic spread"  # Jan - April 2018Clement Husbands; progression  # April 25th 2018- [place]- Taxol x3 ccyles; June 2018- CT chest STABLE Disease;July 25th- Add Avastin to [place] taxol x6 cycles- Sep 2018-CT scan- [facility] STABLE   # Sep 2018- Avastin Maintence;   JAN 9th CT- right lung progression [s/p Bx- [place]; [place] [facility]; [facility]- 1/15]  # NOV 2nd 2017-MRI, [facility]- Brain mets [asymptomatic;Right frontal ~53m; ~374mlesions -appx 3-4 lesions; (right Frontal & Left parietal s/p GK x 2 lesions] ; June 2018- Progession vs radiation necrosis [July 2th-Add Avastin]  # [facility] 4 small subcentimeter brain lesions for which she had the Gamma knife. The main right frontal lesion gotten smaller; improve edema.  # Jan 2019- Progression [CT chest; [facility]]; Brain MRI- new sub cm lesions- monitored  # feb 14th 2019- Tax-Cyram   --------------------------------------------------------------     MOLECULAR STUDIES:  KRAS MUTATED/Tissues not sufficient for OTHER the molecular marker; will need repeat Bx ; GUARDIANT TESTING [[facility]]- pending. [phone] No target[facility] mutations.   ------------------------------------------------------   DIAGNOSIS: [ FEB 2017]- ADENO CA LUNG  STAGE:   IV      ;GOALS: palliative  CURRENT/MOST RECENT THERAPY- Tax-Ram      [place]  [place] -  Chemotherapy    The patient had gemcitabine (GEMZAR) 1,482 mg in sodium chloride 0.9 % 100 mL chemo infusion, 1,000 mg/m2, Intravenous,  Once, 0 of 4 cycles  for chemotherapy treatment.        INTERVAL HISTORY:  Deanna Chavis89.o.  female pleasant patient above history of multifocal adenocarcinoma the lung-currently on Tax-Ram is here for follow-up.  [facility] showed progressive disease; however given "relative stability"-she was recommended continued Tax-Ram.  Patient complains of continued aches and pains " all over"; worsening neuropathy of tingling and numbness in the lower extremities.  She is very tearful.  Patient states that she was not aware of findings of progressive disease on the CT scan.  Review of Systems  Constitutional: Positive for malaise/fatigue. Negative for chills, diaphoresis, fever and weight loss.  HENT: Negative for nosebleeds and sore throat.   Eyes: Negative for double vision.  Respiratory: Negative for cough, hemoptysis, sputum production, shortness of breath and wheezing.   Cardiovascular: Negative for chest pain, palpitations, orthopnea and leg swelling.  Gastrointestinal: Negative for abdominal pain, blood in stool, constipation, diarrhea, heartburn, melena, nausea and vomiting.  Genitourinary: Negative for dysuria, frequency and urgency.  Musculoskeletal: Positive for back pain, joint pain and myalgias.  Skin: Negative.  Negative for itching and rash.  Neurological: Positive for tingling. Negative for dizziness, focal weakness, weakness and headaches.  Endo/Heme/Allergies: Does not bruise/bleed easily.  Psychiatric/Behavioral: Negative for  depression.  The patient is nervous/anxious. The patient does not have insomnia.       PAST MEDICAL HISTORY :  Past Medical History:  Diagnosis Date  . Anxiety   . Cancer of right lung ([place]) 02/13/2015, 01/13/17  . Depression   . Depression with anxiety    Well controlled with Wellbutrin and Buspar  . Pneumonia     PAST SURGICAL HISTORY :   Past Surgical History:  Procedure Laterality Date  . ECTOPIC PREGNANCY SURGERY  1988  . ELECTROMAGNETIC NAVIGATION BROCHOSCOPY N/A 02/10/2015   Procedure: ELECTROMAGNETIC NAVIGATION BRONCHOSCOPY;  Surgeon: Flora Lipps, MD;  Location: [facility] ORS;  Service: Cardiopulmonary;  Laterality: N/A;  . ENDOBRONCHIAL ULTRASOUND N/A 02/10/2015   Procedure: ENDOBRONCHIAL ULTRASOUND;  Surgeon: Flora Lipps, MD;  Location: [facility] ORS;  Service: Cardiopulmonary;  Laterality: N/A;  . PERIPHERAL VASCULAR CATHETERIZATION N/A 03/05/2015   Procedure: Glori Luis Cath Insertion;  Surgeon: Katha Cabal, MD;  Location: [place] CV LAB;  Service: Cardiovascular;  Laterality: N/A;    FAMILY HISTORY :   Family History  Problem Relation Age of Onset  . Cancer Father        Prostate Cancer  . Hypertension Father   . Stroke Father   . Hypertension Brother     SOCIAL HISTORY:   Social History   Tobacco Use  . Smoking status: Former Smoker    Packs/day: 0.25    Years: 30.00    Pack years: 7.50    Types: Cigarettes    Last attempt to quit: 02/06/2005    Years since quitting: 12.5  . Smokeless tobacco: Never Used  Substance Use Topics  . Alcohol use: Yes    Comment: not drank any in several months  . Drug use: No    ALLERGIES:  has No Known Allergies.  MEDICATIONS:  Current Outpatient Medications  Medication Sig Dispense Refill  . acetaminophen (TYLENOL) 500 MG tablet Take 1,000 mg by mouth every 4 (four) hours as needed.     [name] Kitchen dexamethasone (DECADRON) 4 MG tablet Take 1 tablet (4 mg total) by mouth 2 (two) times daily with a meal. Start the day prior to chemo; for 3 days. 60  tablet 3  . docusate sodium (COLACE) 100 MG capsule Take 100 mg by mouth 2 (two) times daily.    . fentaNYL (DURAGESIC - DOSED MCG/HR) 25 MCG/HR patch Place 1 patch (25 mcg total) onto the skin every 3 (three) days. 10 patch 0  . Fluticasone-Umeclidin-Vilant (TRELEGY ELLIPTA) 100-62.5-25 MCG/INH AEPB Inhale 1 puff into the lungs daily. 1 each 4  . ibuprofen (IBU) 400 MG tablet Take 1 tablet (400 mg total) by mouth 3 (three) times daily as needed. 30 tablet 0  . lidocaine-prilocaine (EMLA) cream Apply 1 application topically as needed. 30 g 6  . magic mouthwash w/lidocaine SOLN Take 5 mLs by mouth 4 (four) times daily. 480 mL 3  . ondansetron (ZOFRAN) 8 MG tablet Take 1 tablet (8 mg total) by mouth every 8 (eight) hours as needed for nausea or vomiting (start 3 days; after chemo). 40 tablet 1  . Oxycodone HCl 10 MG TABS Take 1 tablet (10 mg total) by mouth every 6 (six) hours as needed (moderate to severe pain). 60 tablet 0  . polyethylene glycol (MIRALAX / GLYCOLAX) packet Take 17 g by mouth daily as needed.    . prochlorperazine (COMPAZINE) 10 MG tablet Take 1 tablet (10 mg total) by mouth every 6 (six) hours as needed for nausea or vomiting.  25 tablet 1  . TRELEGY ELLIPTA 100-62.5-25 MCG/INH AEPB INHALE 1 PUFF ONCE DAILY 60 each 4  . zolpidem (AMBIEN) 5 MG tablet TAKE 1 TABLET BY MOUTH AT BEDTIME AS NEEDED FOR SLEEP 30 tablet 2  . LORazepam (ATIVAN) 0.5 MG tablet Take 1 tablet (0.5 mg total) by mouth 2 (two) times daily. As needed for anxiety/ nausea 60 tablet 0   No current facility-administered medications for this visit.    Facility-Administered Medications Ordered in Other Visits  Medication Dose Route Frequency Provider Last Rate Last Dose  . 0.9 %  sodium chloride infusion   Intravenous Continuous Jacquelin Hawking, NP      . dexamethasone (DECADRON) injection 10 mg  10 mg Intravenous Once Jacquelin Hawking, NP        PHYSICAL EXAMINATION: ECOG PERFORMANCE STATUS: 1 - Symptomatic but  completely ambulatory  BP 133/86 (BP Location: Left Arm, Patient Position: Sitting)   Pulse (!) 101   Temp 98.1 F (36.7 C) (Tympanic)   Resp 16   Wt 112 lb (50.8 kg)   LMP 06/16/2000 (Approximate) Comment: 15 yrs ago  BMI 19.84 kg/m   Filed Weights   08/24/17 0837  Weight: 112 lb (50.8 kg)    Physical Exam  Constitutional: She is oriented to person, place, and time and well-developed, well-nourished, and in no distress.  HENT:  Head: Normocephalic and atraumatic.  Mouth/Throat: Oropharynx is clear and moist. No oropharyngeal exudate.  Eyes: Pupils are equal, round, and reactive to light.  Neck: Normal range of motion. Neck supple.  Cardiovascular: Normal rate and regular rhythm.  Pulmonary/Chest: No respiratory distress. She has no wheezes.  Abdominal: Soft. Bowel sounds are normal. She exhibits no distension and no mass. There is no tenderness. There is no rebound and no guarding.  Musculoskeletal: Normal range of motion. She exhibits no edema or tenderness.  Neurological: She is alert and oriented to person, place, and time.  Skin: Skin is warm.  Psychiatric: Affect normal.  Tearful anxious.       LABORATORY DATA:  I have reviewed the data as listed    Component Value Date/Time   NA 137 08/24/2017 0816   NA 139 01/03/2015 0910   K 3.6 08/24/2017 0816   CL 105 08/24/2017 0816   CO2 23 08/24/2017 0816   GLUCOSE 128 (H) 08/24/2017 0816   BUN 9 08/24/2017 0816   BUN 10 01/03/2015 0910   CREATININE 0.61 08/24/2017 0816   CALCIUM 9.1 08/24/2017 0816   PROT 6.9 08/24/2017 0816   PROT 6.5 01/03/2015 0910   ALBUMIN 3.3 (L) 08/24/2017 0816   ALBUMIN 3.8 01/03/2015 0910   AST 25 08/24/2017 0816   ALT 8 08/24/2017 0816   ALKPHOS 49 08/24/2017 0816   BILITOT 0.7 08/24/2017 0816   BILITOT <0.2 01/03/2015 0910   GFRNONAA >60 08/24/2017 0816   GFRAA >60 08/24/2017 0816    No results found for: SPEP, UPEP  Lab Results  Component Value Date   WBC 8.4 08/24/2017    NEUTROABS 5.8 08/24/2017   HGB 11.5 (L) 08/24/2017   HCT 33.7 (L) 08/24/2017   MCV 100.1 (H) 08/24/2017   PLT 372 08/24/2017      Chemistry      Component Value Date/Time   NA 137 08/24/2017 0816   NA 139 01/03/2015 0910   K 3.6 08/24/2017 0816   CL 105 08/24/2017 0816   CO2 23 08/24/2017 0816   BUN 9 08/24/2017 0816   BUN 10 01/03/2015 0910  CREATININE 0.61 08/24/2017 0816      Component Value Date/Time   CALCIUM 9.1 08/24/2017 0816   ALKPHOS 49 08/24/2017 0816   AST 25 08/24/2017 0816   ALT 8 08/24/2017 0816   BILITOT 0.7 08/24/2017 0816   BILITOT <0.2 01/03/2015 0910       RADIOGRAPHIC STUDIES: I have personally reviewed the radiological images as listed and agreed with the findings in the report. No results found.   ASSESSMENT & PLAN:  Primary cancer of right lower lobe of lung ([place]) # Metastatic adenocarcinoma the lung-currently on Taxotere Cyramza [since march 2019]; AUG 8th 2019 [[facility]] CT scan-slightly increased in multi-focal adeno ca.   #Discussed the option of continued Tax-Ram-as recommended by [facility].  However patient has significant concerns about the side effects [myalgias/nausea vomiting/worsening neuropathy from the current chemotherapy.].  #  Discussed option of gemcitabine day 1 day 8 every 21 days.  Patient interested in gemcitabine chemotherapy.  Discussed the potential side effects.   #Brain mets status post [facility]; right parietal 4-5 mm lesion slightly bigger/stable overall.  Continue monitoring.  #Peripheral neuropathy grade 2; worse as per patient.  Continue gabapentin.  # Intermittent nausea vomiting-question anticipatory.  Recommend Ativan 0.5 mg twice a day as needed  # HOLD  Chemo today; DE-ACCESS; start [place] [Vacation- Sep23rd- 1st oct  ]  #Chemotherapy gemcitabine no labs 1 week; follow-up with me 2 weeks gemcitabine chemotherapy.   # 40 minutes face-to-face with the patient discussing the above plan of care;  more than 50% of time spent on prognosis/ natural history; counseling and coordination.  Cc: Dr>leventakos.    No orders of the defined types were placed in this encounter.  All questions were answered. The patient knows to call the clinic with any problems, questions or concerns.      Cammie Sickle, MD 08/24/2017 12:29 PM

## 2017-08-24 NOTE — Progress Notes (Signed)
DISCONTINUE ON PATHWAY REGIMEN - Non-Small Cell Lung     A cycle is every 21 days:     Ramucirumab      Docetaxel   **Always confirm dose/schedule in your pharmacy ordering system**  REASON: Disease Progression PRIOR TREATMENT: [id number]: Docetaxel 75 mg/m2 + Ramucirumab 10 mg/kg q21 Days Until Progression or Unacceptable Toxicity TREATMENT RESPONSE: Progressive Disease (PD)  START ON PATHWAY REGIMEN - Non-Small Cell Lung     A cycle is every 21 days:     Docetaxel   **Always confirm dose/schedule in your pharmacy ordering system**  Patient Characteristics: Stage IV Metastatic, Nonsquamous, Third Line - Chemotherapy/Immunotherapy, PS = 0, 1, Prior PD-1/PD-L1 Inhibitor or No Prior PD-1/PD-L1 Inhibitor and Not a Candidate for Immunotherapy AJCC T Category: T4 Current Disease Status: Distant Metastases AJCC N Category: N2 AJCC M Category: M1a AJCC 8 Stage Grouping: IVA Histology: Nonsquamous Cell ROS1 Rearrangement Status: Negative T790M Mutation Status: Not Applicable - EGFR Mutation Negative/Unknown Other Mutations/Biomarkers: No Other Actionable Mutations NTRK Gene Fusion Status: Negative PD-L1 Expression Status: Quantity Not Sufficient Chemotherapy/Immunotherapy LOT: Third Line Chemotherapy/Immunotherapy Molecular Targeted Therapy: Not Appropriate ALK Translocation Status: Positive EGFR Mutation Status: Negative/Wild Type BRAF V600E Mutation Status: Negative Performance Status: PS = 0, 1 Immunotherapy Candidate Status: Not a Candidate for Immunotherapy Prior Immunotherapy Status: Prior PD-1/PD-L1 Inhibitor Intent of Therapy: Non-Curative / Palliative Intent, Discussed with Patient

## 2017-08-31 ENCOUNTER — Other Ambulatory Visit: Payer: Self-pay | Admitting: Internal Medicine

## 2017-08-31 ENCOUNTER — Inpatient Hospital Stay: Payer: BLUE CROSS/BLUE SHIELD

## 2017-08-31 VITALS — BP 104/73 | HR 73 | Temp 97.9°F | Resp 20 | Wt 108.6 lb

## 2017-08-31 DIAGNOSIS — Z5111 Encounter for antineoplastic chemotherapy: Secondary | ICD-10-CM | POA: Diagnosis not present

## 2017-08-31 DIAGNOSIS — C3431 Malignant neoplasm of lower lobe, right bronchus or lung: Secondary | ICD-10-CM

## 2017-08-31 DIAGNOSIS — R112 Nausea with vomiting, unspecified: Secondary | ICD-10-CM

## 2017-08-31 DIAGNOSIS — T451X5A Adverse effect of antineoplastic and immunosuppressive drugs, initial encounter: Secondary | ICD-10-CM

## 2017-08-31 MED ORDER — PROCHLORPERAZINE MALEATE 5 MG PO TABS
10.0000 mg | ORAL_TABLET | Freq: Once | ORAL | Status: AC
  Administered 2017-08-31: 10 mg via ORAL
  Filled 2017-08-31: qty 2

## 2017-08-31 MED ORDER — ONDANSETRON HCL 8 MG PO TABS: 8.0000 mg | ORAL_TABLET | Freq: Three times a day (TID) | ORAL | 3 refills | Status: AC | PRN

## 2017-08-31 MED ORDER — SODIUM CHLORIDE 0.9% FLUSH
10.0000 mL | INTRAVENOUS | Status: DC | PRN
  Administered 2017-08-31: 10 mL
  Filled 2017-08-31: qty 10

## 2017-08-31 MED ORDER — SODIUM CHLORIDE 0.9 % IV SOLN
1500.0000 mg | Freq: Once | INTRAVENOUS | Status: AC
  Administered 2017-08-31: 1482.8897 mg via INTRAVENOUS
  Filled 2017-08-31: qty 26.3

## 2017-08-31 MED ORDER — HEPARIN SOD (PORK) LOCK FLUSH 100 UNIT/ML IV SOLN
500.0000 [IU] | Freq: Once | INTRAVENOUS | Status: AC | PRN
  Administered 2017-08-31: 500 [IU]
  Filled 2017-08-31: qty 5

## 2017-08-31 MED ORDER — SODIUM CHLORIDE 0.9 % IV SOLN
Freq: Once | INTRAVENOUS | Status: AC
  Administered 2017-08-31: 09:00:00 via INTRAVENOUS
  Filled 2017-08-31: qty 250

## 2017-08-31 MED ORDER — PROCHLORPERAZINE MALEATE 10 MG PO TABS: 10.0000 mg | ORAL_TABLET | Freq: Four times a day (QID) | ORAL | 3 refills | Status: DC | PRN

## 2017-09-01 ENCOUNTER — Telehealth: Payer: Self-pay | Admitting: *Deleted

## 2017-09-01 NOTE — Telephone Encounter (Signed)
Authorization for quantity fill override has been approved. I left a vm for [facility] and faxed the insurance approval. [facility] can send this script back through the pharmacy system. Also left vm for patient that I notified [facility] that her script was approved.

## 2017-09-01 NOTE — Telephone Encounter (Signed)
Case manager - [employer] calling - regarding patient's zofran. Patient's insurance will only dispense 9 tablets at a time. [employer] requesting that office call [facility] for a PA  "quantity fill override" -  Requesting office to contact [phone]. Case managers phone # is [phone]

## 2017-09-06 ENCOUNTER — Other Ambulatory Visit: Payer: Self-pay | Admitting: *Deleted

## 2017-09-06 DIAGNOSIS — C3431 Malignant neoplasm of lower lobe, right bronchus or lung: Secondary | ICD-10-CM

## 2017-09-07 ENCOUNTER — Inpatient Hospital Stay (HOSPITAL_BASED_OUTPATIENT_CLINIC_OR_DEPARTMENT_OTHER): Payer: BLUE CROSS/BLUE SHIELD | Admitting: Internal Medicine

## 2017-09-07 ENCOUNTER — Inpatient Hospital Stay: Payer: BLUE CROSS/BLUE SHIELD | Attending: Internal Medicine

## 2017-09-07 ENCOUNTER — Inpatient Hospital Stay: Payer: BLUE CROSS/BLUE SHIELD

## 2017-09-07 ENCOUNTER — Encounter: Payer: Self-pay | Admitting: Internal Medicine

## 2017-09-07 VITALS — BP 120/78 | HR 83 | Temp 97.9°F | Resp 16 | Wt 109.8 lb

## 2017-09-07 DIAGNOSIS — F418 Other specified anxiety disorders: Secondary | ICD-10-CM | POA: Diagnosis not present

## 2017-09-07 DIAGNOSIS — D696 Thrombocytopenia, unspecified: Secondary | ICD-10-CM | POA: Diagnosis not present

## 2017-09-07 DIAGNOSIS — R4182 Altered mental status, unspecified: Secondary | ICD-10-CM | POA: Diagnosis not present

## 2017-09-07 DIAGNOSIS — Z79899 Other long term (current) drug therapy: Secondary | ICD-10-CM | POA: Diagnosis not present

## 2017-09-07 DIAGNOSIS — C3431 Malignant neoplasm of lower lobe, right bronchus or lung: Secondary | ICD-10-CM

## 2017-09-07 DIAGNOSIS — C7931 Secondary malignant neoplasm of brain: Secondary | ICD-10-CM | POA: Insufficient documentation

## 2017-09-07 DIAGNOSIS — D6481 Anemia due to antineoplastic chemotherapy: Secondary | ICD-10-CM | POA: Diagnosis not present

## 2017-09-07 DIAGNOSIS — E860 Dehydration: Secondary | ICD-10-CM | POA: Insufficient documentation

## 2017-09-07 DIAGNOSIS — Z87891 Personal history of nicotine dependence: Secondary | ICD-10-CM | POA: Diagnosis not present

## 2017-09-07 DIAGNOSIS — Z8042 Family history of malignant neoplasm of prostate: Secondary | ICD-10-CM

## 2017-09-07 DIAGNOSIS — Z8249 Family history of ischemic heart disease and other diseases of the circulatory system: Secondary | ICD-10-CM

## 2017-09-07 DIAGNOSIS — R112 Nausea with vomiting, unspecified: Secondary | ICD-10-CM

## 2017-09-07 DIAGNOSIS — E876 Hypokalemia: Secondary | ICD-10-CM | POA: Insufficient documentation

## 2017-09-07 DIAGNOSIS — R443 Hallucinations, unspecified: Secondary | ICD-10-CM | POA: Insufficient documentation

## 2017-09-07 DIAGNOSIS — R530 Neoplastic (malignant) related fatigue: Secondary | ICD-10-CM

## 2017-09-07 DIAGNOSIS — D701 Agranulocytosis secondary to cancer chemotherapy: Secondary | ICD-10-CM | POA: Diagnosis not present

## 2017-09-07 DIAGNOSIS — R0981 Nasal congestion: Secondary | ICD-10-CM | POA: Diagnosis not present

## 2017-09-07 DIAGNOSIS — G620 Drug-induced polyneuropathy: Secondary | ICD-10-CM

## 2017-09-07 DIAGNOSIS — R1011 Right upper quadrant pain: Secondary | ICD-10-CM | POA: Insufficient documentation

## 2017-09-07 DIAGNOSIS — Z5111 Encounter for antineoplastic chemotherapy: Secondary | ICD-10-CM | POA: Diagnosis present

## 2017-09-07 DIAGNOSIS — R509 Fever, unspecified: Secondary | ICD-10-CM | POA: Insufficient documentation

## 2017-09-07 DIAGNOSIS — R05 Cough: Secondary | ICD-10-CM | POA: Diagnosis not present

## 2017-09-07 DIAGNOSIS — R110 Nausea: Secondary | ICD-10-CM | POA: Insufficient documentation

## 2017-09-07 LAB — CBC WITH DIFFERENTIAL/PLATELET
Basophils Absolute: 0 10*3/uL (ref 0–0.1)
Basophils Relative: 1 %
EOS ABS: 0.1 10*3/uL (ref 0–0.7)
Eosinophils Relative: 2 %
HCT: 30.7 % — ABNORMAL LOW (ref 35.0–47.0)
HEMOGLOBIN: 10.5 g/dL — AB (ref 12.0–16.0)
LYMPHS ABS: 1 10*3/uL (ref 1.0–3.6)
Lymphocytes Relative: 32 %
MCH: 33.8 pg (ref 26.0–34.0)
MCHC: 34.3 g/dL (ref 32.0–36.0)
MCV: 98.5 fL (ref 80.0–100.0)
MONO ABS: 0.5 10*3/uL (ref 0.2–0.9)
MONOS PCT: 16 %
Neutro Abs: 1.6 10*3/uL (ref 1.4–6.5)
Neutrophils Relative %: 49 %
Platelets: 189 10*3/uL (ref 150–440)
RBC: 3.11 MIL/uL — ABNORMAL LOW (ref 3.80–5.20)
RDW: 17.9 % — AB (ref 11.5–14.5)
WBC: 3.3 10*3/uL — ABNORMAL LOW (ref 3.6–11.0)

## 2017-09-07 LAB — COMPREHENSIVE METABOLIC PANEL
ALBUMIN: 3.4 g/dL — AB (ref 3.5–5.0)
ALK PHOS: 53 U/L (ref 38–126)
ALT: 11 U/L (ref 0–44)
AST: 31 U/L (ref 15–41)
Anion gap: 10 (ref 5–15)
BUN: 7 mg/dL — AB (ref 8–23)
CALCIUM: 9.4 mg/dL (ref 8.9–10.3)
CHLORIDE: 104 mmol/L (ref 98–111)
CO2: 25 mmol/L (ref 22–32)
CREATININE: 0.72 mg/dL (ref 0.44–1.00)
GFR calc Af Amer: >60 mL/min (ref >60)
GFR calc non Af Amer: >60 mL/min (ref >60)
GLUCOSE: 123 mg/dL — AB (ref 70–99)
Potassium: 3.5 mmol/L (ref 3.5–5.1)
SODIUM: 139 mmol/L (ref 135–145)
Total Bilirubin: 0.6 mg/dL (ref 0.3–1.2)
Total Protein: 6.8 g/dL (ref 6.5–8.1)

## 2017-09-07 MED ORDER — SODIUM CHLORIDE 0.9 % IV SOLN
Freq: Once | INTRAVENOUS | Status: AC
  Administered 2017-09-07: 10:00:00 via INTRAVENOUS
  Filled 2017-09-07: qty 250

## 2017-09-07 MED ORDER — HEPARIN SOD (PORK) LOCK FLUSH 100 UNIT/ML IV SOLN
500.0000 [IU] | Freq: Once | INTRAVENOUS | Status: AC
  Administered 2017-09-07: 500 [IU] via INTRAVENOUS
  Filled 2017-09-07: qty 5

## 2017-09-07 MED ORDER — PROCHLORPERAZINE MALEATE 10 MG PO TABS
10.0000 mg | ORAL_TABLET | Freq: Once | ORAL | Status: AC
  Administered 2017-09-07: 10 mg via ORAL

## 2017-09-07 MED ORDER — SODIUM CHLORIDE 0.9 % IV SOLN
1000.0000 mg/m2 | Freq: Once | INTRAVENOUS | Status: AC
  Administered 2017-09-07: 1482 mg via INTRAVENOUS
  Filled 2017-09-07: qty 23.2

## 2017-09-07 MED ORDER — SODIUM CHLORIDE 0.9% FLUSH
10.0000 mL | Freq: Once | INTRAVENOUS | Status: AC
  Administered 2017-09-07: 10 mL via INTRAVENOUS
  Filled 2017-09-07: qty 10

## 2017-09-07 NOTE — Assessment & Plan Note (Addendum)
#   Metastatic adenocarcinoma the lung-AUG 8th 2019 [[facility]] CT scan-slightly increased in multi-focal adeno ca. Currently on gemcitabine. STABLE.   # Proceed with gemcitabine cycle #1 day 8. Labs today reviewed;  acceptable for treatment today.   #Brain mets status post [facility]; right parietal 4-5 mm lesion slightly bigger/stable overall. STABLE.   #Peripheral neuropathy grade 2; STABLE.  Continue gabapentin.  # Intermittent nausea vomiting-improved; continue Ativan 0.5 mg twice a day as needed  # follow-up with me 2 weeks gemcitabine chemotherapy; will miss chemo on 9/25   Cc: Dr>leventakos.

## 2017-09-07 NOTE — Progress Notes (Signed)
[place] OFFICE PROGRESS NOTE  Patient Care Team: Rubye Beach as PCP - General (Family Medicine) Allyne Gee, MD as Referring Physician (Internal Medicine)  Cancer Staging No matching staging information was found for the patient.   Oncology History   # FEB 2017-ADENO CA Right Lower Lung T4N2M0- STAGE IIIB [Bronch & Subcarinal LNBx]- March 8th START CARBO-ALIMTA x4 cycles- June 8th 2017 - IMPROVED FDG MULTI-FOCAL lesions/N2-LN activity. S/p [place]-- Alimta x6  # AUG 18th- Alim[place]- Avastin maintenance; SEP 13th DISCONTINUE AVASTIN [sec to cavitary lesion]; OCT 20th 2017- CT-STABLE Disease; NOV 8th CT/PET [facility]]- "overall slight progression-? Lymphangiectatic spread"  # Jan - April 2018[facility] [place]; progression  # April 25th 2018- [place]bo- Taxol x3 ccyles; June 2018- CT chest STABLE Disease;July 25th- Add Avastin to [place] taxol x6 cycles- Sep 2018-CT scan- [facility] STABLE   # Sep 2018- Avastin Maintence;   JAN 9th CT- right lung progression [s/p Bx- [place]; [place] [facility]; [facility]- 1/15]  # NOV 2nd 2017-MRI, [facility]- Brain mets [asymptomatic;Right frontal ~60m; ~341mlesions -appx 3-4 lesions; (right Frontal & Left parietal s/p GK x 2 lesions] ; June 2018- Progession vs radiation necrosis [July 2th-Add Avastin]  # Oct 2018- [facility]] 4 small subcentimeter brain lesions for which she had the Gamma knife. The main right frontal lesion gotten smaller; improve edema.  # Jan 2019- Progression [CT chest; [facility]]; Brain MRI- new sub cm lesions- monitored  # feb 14th 2019- Tax-Cyram   --------------------------------------------------------------     MOLECULAR STUDIES:  KRAS MUTATED/Tissues not sufficient for OTHER the molecular marker; will need repeat Bx ; GUARDIANT TESTING [[facility]]- pending. # [facility] No targettable mutations.   ------------------------------------------------------   DIAGNOSIS: [ FEB 2017]- ADENO CA LUNG  STAGE:   IV      ;GOALS: palliative  CURRENT/MOST RECENT THERAPY- Tax-Ram      Primary cancer of right lower lobe of lung ([facility]  08/30/2017 -  Chemotherapy    The patient had gemcitabine (GEMZAR) 1,2,831.5176g in sodium chloride 0.9 % 250 mL chemo infusion, 1,482 mg, Intravenous,  Once, 1 of 4 cycles Administration: 1,1,607.3710g (08/31/2017), 1,482 mg (09/07/2017)  for chemotherapy treatment.        INTERVAL HISTORY:  [name]89.o.  female pleasant patient above history of multifocal adenocarcinoma the lung-currently gemcitabine is here for follow-up.  Patient tolerated 1 dose gemcitabine last week very well except for mild myalgias.  Nausea improved with Ativan.  Denies any worsening shortness of breath.  Continues to have tingling in the feet.  Mild fatigue.   Review of Systems  Constitutional: Positive for malaise/fatigue. Negative for chills, diaphoresis, fever and weight loss.  HENT: Negative for nosebleeds and sore throat.   Eyes: Negative for double vision.  Respiratory: Negative for cough, hemoptysis, sputum production, shortness of breath and wheezing.   Cardiovascular: Negative for chest pain, palpitations, orthopnea and leg swelling.  Gastrointestinal: Negative for abdominal pain, blood in stool, constipation, diarrhea, heartburn, melena, nausea and vomiting.  Genitourinary: Negative for dysuria, frequency and urgency.  Musculoskeletal: Positive for back pain, joint pain and myalgias.  Skin: Negative.  Negative for itching and rash.  Neurological: Positive for tingling. Negative for dizziness, focal weakness, weakness and headaches.  Endo/Heme/Allergies: Does not bruise/bleed easily.  Psychiatric/Behavioral: Negative for depression. The patient is nervous/anxious. The patient does not have insomnia.       PAST MEDICAL  HISTORY :  Past Medical History:   Diagnosis Date  . Anxiety   . Cancer of right lung ([place]) 02/13/2015, 01/13/17  . Depression   . Depression with anxiety    Well controlled with Wellbutrin and Buspar  . Pneumonia     PAST SURGICAL HISTORY :   Past Surgical History:  Procedure Laterality Date  . ECTOPIC PREGNANCY SURGERY  1988  . ELECTROMAGNETIC NAVIGATION BROCHOSCOPY N/A 02/10/2015   Procedure: ELECTROMAGNETIC NAVIGATION BRONCHOSCOPY;  Surgeon: Flora Lipps, MD;  Location: [facility] ORS;  Service: Cardiopulmonary;  Laterality: N/A;  . ENDOBRONCHIAL ULTRASOUND N/A 02/10/2015   Procedure: ENDOBRONCHIAL ULTRASOUND;  Surgeon: Flora Lipps, MD;  Location: [facility] ORS;  Service: Cardiopulmonary;  Laterality: N/A;  . PERIPHERAL VASCULAR CATHETERIZATION N/A 03/05/2015   Procedure: Glori Luis Cath Insertion;  Surgeon: Katha Cabal, MD;  Location: [place] [facility];  Service: Cardiovascular;  Laterality: N/A;    FAMILY HISTORY :   Family History  Problem Relation Age of Onset  . Cancer Father        Prostate Cancer  . Hypertension Father   . Stroke Father   . Hypertension Brother     SOCIAL HISTORY:   Social History   Tobacco Use  . Smoking status: Former Smoker    Packs/day: 0.25    Years: 30.00    Pack years: 7.50    Types: Cigarettes    Last attempt to quit: 02/06/2005    Years since quitting: 12.5  . Smokeless tobacco: Never Used  Substance Use Topics  . Alcohol use: Yes    Comment: not drank any in several months  . Drug use: No    ALLERGIES:  has No Known Allergies.  MEDICATIONS:  Current Outpatient Medications  Medication Sig Dispense Refill  . acetaminophen (TYLENOL) 500 MG tablet Take 1,000 mg by mouth every 4 (four) hours as needed.     [name] Kitchen dexamethasone (DECADRON) 4 MG tablet Take 1 tablet (4 mg total) by mouth 2 (two) times daily with a meal. Start the day prior to chemo; for 3 days. 60 tablet 3  . docusate sodium (COLACE) 100 MG capsule Take 100 mg by mouth 2 (two) times daily.    . fentaNYL (DURAGESIC -  DOSED MCG/HR) 25 MCG/HR patch Place 1 patch (25 mcg total) onto the skin every 3 (three) days. 10 patch 0  . Fluticasone-Umeclidin-Vilant (TRELEGY ELLIPTA) 100-62.5-25 MCG/INH AEPB Inhale 1 puff into the lungs daily. 1 each 4  . ibuprofen (IBU) 400 MG tablet Take 1 tablet (400 mg total) by mouth 3 (three) times daily as needed. 30 tablet 0  . lidocaine-prilocaine (EMLA) cream Apply 1 application topically as needed. 30 g 6  . LORazepam (ATIVAN) 0.5 MG tablet Take 1 tablet (0.5 mg total) by mouth 2 (two) times daily. As needed for anxiety/ nausea 60 tablet 0  . magic mouthwash w/lidocaine SOLN Take 5 mLs by mouth 4 (four) times daily. 480 mL 3  . ondansetron (ZOFRAN) 8 MG tablet Take 1 tablet (8 mg total) by mouth every 8 (eight) hours as needed for nausea or vomiting (start 3 days; after chemo). 40 tablet 3  . Oxycodone HCl 10 MG TABS Take 1 tablet (10 mg total) by mouth every 6 (six) hours as needed (moderate to severe pain). 60 tablet 0  . polyethylene glycol (MIRALAX / GLYCOLAX) packet Take 17 g by mouth daily as needed.    . prochlorperazine (COMPAZINE) 10 MG tablet Take 1 tablet (10 mg total) by mouth every 6 (  six) hours as needed for nausea or vomiting. 30 tablet 3  . TRELEGY ELLIPTA 100-62.5-25 MCG/INH AEPB INHALE 1 PUFF ONCE DAILY 60 each 4  . zolpidem (AMBIEN) 5 MG tablet TAKE 1 TABLET BY MOUTH AT BEDTIME AS NEEDED FOR SLEEP 30 tablet 2   No current facility-administered medications for this visit.    Facility-Administered Medications Ordered in Other Visits  Medication Dose Route Frequency Provider Last Rate Last Dose  . 0.9 %  sodium chloride infusion   Intravenous Continuous Jacquelin Hawking, NP      . dexamethasone (DECADRON) injection 10 mg  10 mg Intravenous Once Jacquelin Hawking, NP        PHYSICAL EXAMINATION: ECOG PERFORMANCE STATUS: 1 - Symptomatic but completely ambulatory  BP 120/78 (BP Location: Left Arm, Patient Position: Sitting)   Pulse 83   Temp 97.9 F (36.6 C)  (Tympanic)   Resp 16   Wt 109 lb 12.8 oz (49.8 kg)   LMP 06/16/2000 (Approximate) Comment: 15 yrs ago  BMI 19.45 kg/m   Filed Weights   09/07/17 0833  Weight: 109 lb 12.8 oz (49.8 kg)    Physical Exam  Constitutional: She is oriented to person, place, and time and well-developed, well-nourished, and in no distress.  HENT:  Head: Normocephalic and atraumatic.  Mouth/Throat: Oropharynx is clear and moist. No oropharyngeal exudate.  Eyes: Pupils are equal, round, and reactive to light.  Neck: Normal range of motion. Neck supple.  Cardiovascular: Normal rate and regular rhythm.  Pulmonary/Chest: No respiratory distress. She has no wheezes.  Abdominal: Soft. Bowel sounds are normal. She exhibits no distension and no mass. There is no tenderness. There is no rebound and no guarding.  Musculoskeletal: Normal range of motion. She exhibits no edema or tenderness.  Neurological: She is alert and oriented to person, place, and time.  Skin: Skin is warm.  Psychiatric: Affect normal.  Tearful anxious.       LABORATORY DATA:  I have reviewed the data as listed    Component Value Date/Time   NA 139 09/07/2017 0810   NA 139 01/03/2015 0910   K 3.5 09/07/2017 0810   CL 104 09/07/2017 0810   CO2 25 09/07/2017 0810   GLUCOSE 123 (H) 09/07/2017 0810   BUN 7 (L) 09/07/2017 0810   BUN 10 01/03/2015 0910   CREATININE 0.72 09/07/2017 0810   CALCIUM 9.4 09/07/2017 0810   PROT 6.8 09/07/2017 0810   PROT 6.5 01/03/2015 0910   ALBUMIN 3.4 (L) 09/07/2017 0810   ALBUMIN 3.8 01/03/2015 0910   AST 31 09/07/2017 0810   ALT 11 09/07/2017 0810   ALKPHOS 53 09/07/2017 0810   BILITOT 0.6 09/07/2017 0810   BILITOT <0.2 01/03/2015 0910   GFRNONAA >60 09/07/2017 0810   GFRAA >60 09/07/2017 0810    No results found for: SPEP, UPEP  Lab Results  Component Value Date   WBC 3.3 (L) 09/07/2017   NEUTROABS 1.6 09/07/2017   HGB 10.5 (L) 09/07/2017   HCT 30.7 (L) 09/07/2017   MCV 98.5 09/07/2017    PLT 189 09/07/2017      Chemistry      Component Value Date/Time   NA 139 09/07/2017 0810   NA 139 01/03/2015 0910   K 3.5 09/07/2017 0810   CL 104 09/07/2017 0810   CO2 25 09/07/2017 0810   BUN 7 (L) 09/07/2017 0810   BUN 10 01/03/2015 0910   CREATININE 0.72 09/07/2017 0810      Component Value Date/Time  CALCIUM 9.4 09/07/2017 0810   ALKPHOS 53 09/07/2017 0810   AST 31 09/07/2017 0810   ALT 11 09/07/2017 0810   BILITOT 0.6 09/07/2017 0810   BILITOT <0.2 01/03/2015 0910       RADIOGRAPHIC STUDIES: I have personally reviewed the radiological images as listed and agreed with the findings in the report. No results found.   ASSESSMENT & PLAN:  Primary cancer of right lower lobe of lung ([place]) # Metastatic adenocarcinoma the lung-AUG 8th 2019 [[facility]] CT scan-slightly increased in multi-focal adeno ca. Currently on gemcitabine. STABLE.   # Proceed with gemcitabine cycle #1 day 8. Labs today reviewed;  acceptable for treatment today.   #Brain mets status post [facility]; right parietal 4-5 mm lesion slightly bigger/stable overall. STABLE.   #Peripheral neuropathy grade 2; STABLE.  Continue gabapentin.  # Intermittent nausea vomiting-improved; continue Ativan 0.5 mg twice a day as needed  # follow-up with me 2 weeks gemcitabine chemotherapy; will miss chemo on 9/25   Cc: Dr>leventakos.    No orders of the defined types were placed in this encounter.  All questions were answered. The patient knows to call the clinic with any problems, questions or concerns.      Cammie Sickle, MD 09/07/2017 6:29 PM

## 2017-09-09 ENCOUNTER — Other Ambulatory Visit: Payer: Self-pay | Admitting: Internal Medicine

## 2017-09-09 DIAGNOSIS — C3431 Malignant neoplasm of lower lobe, right bronchus or lung: Secondary | ICD-10-CM

## 2017-09-09 DIAGNOSIS — G893 Neoplasm related pain (acute) (chronic): Secondary | ICD-10-CM

## 2017-09-09 MED ORDER — OXYCODONE HCL 10 MG PO TABS: 10.0000 mg | ORAL_TABLET | Freq: Four times a day (QID) | ORAL | 0 refills | Status: DC | PRN

## 2017-09-09 MED ORDER — FENTANYL 25 MCG/HR TD PT72: 25.0000 ug | MEDICATED_PATCH | TRANSDERMAL | 0 refills | Status: DC

## 2017-09-12 ENCOUNTER — Inpatient Hospital Stay: Payer: BLUE CROSS/BLUE SHIELD

## 2017-09-12 ENCOUNTER — Inpatient Hospital Stay (HOSPITAL_BASED_OUTPATIENT_CLINIC_OR_DEPARTMENT_OTHER): Payer: BLUE CROSS/BLUE SHIELD | Admitting: Oncology

## 2017-09-12 ENCOUNTER — Telehealth: Payer: Self-pay | Admitting: *Deleted

## 2017-09-12 ENCOUNTER — Encounter: Payer: Self-pay | Admitting: Oncology

## 2017-09-12 VITALS — BP 124/85 | HR 71 | Temp 98.2°F | Resp 14

## 2017-09-12 DIAGNOSIS — D701 Agranulocytosis secondary to cancer chemotherapy: Secondary | ICD-10-CM

## 2017-09-12 DIAGNOSIS — G620 Drug-induced polyneuropathy: Secondary | ICD-10-CM

## 2017-09-12 DIAGNOSIS — R1011 Right upper quadrant pain: Secondary | ICD-10-CM

## 2017-09-12 DIAGNOSIS — F418 Other specified anxiety disorders: Secondary | ICD-10-CM

## 2017-09-12 DIAGNOSIS — E860 Dehydration: Secondary | ICD-10-CM

## 2017-09-12 DIAGNOSIS — Z79899 Other long term (current) drug therapy: Secondary | ICD-10-CM

## 2017-09-12 DIAGNOSIS — C3431 Malignant neoplasm of lower lobe, right bronchus or lung: Secondary | ICD-10-CM

## 2017-09-12 DIAGNOSIS — T451X5A Adverse effect of antineoplastic and immunosuppressive drugs, initial encounter: Secondary | ICD-10-CM

## 2017-09-12 DIAGNOSIS — Z8249 Family history of ischemic heart disease and other diseases of the circulatory system: Secondary | ICD-10-CM

## 2017-09-12 DIAGNOSIS — E876 Hypokalemia: Secondary | ICD-10-CM

## 2017-09-12 DIAGNOSIS — Z8042 Family history of malignant neoplasm of prostate: Secondary | ICD-10-CM

## 2017-09-12 DIAGNOSIS — Z87891 Personal history of nicotine dependence: Secondary | ICD-10-CM

## 2017-09-12 DIAGNOSIS — C7931 Secondary malignant neoplasm of brain: Secondary | ICD-10-CM | POA: Diagnosis not present

## 2017-09-12 DIAGNOSIS — Z5189 Encounter for other specified aftercare: Secondary | ICD-10-CM

## 2017-09-12 DIAGNOSIS — R509 Fever, unspecified: Secondary | ICD-10-CM

## 2017-09-12 DIAGNOSIS — R110 Nausea: Secondary | ICD-10-CM

## 2017-09-12 DIAGNOSIS — D703 Neutropenia due to infection: Secondary | ICD-10-CM

## 2017-09-12 DIAGNOSIS — G893 Neoplasm related pain (acute) (chronic): Secondary | ICD-10-CM

## 2017-09-12 DIAGNOSIS — Z95828 Presence of other vascular implants and grafts: Secondary | ICD-10-CM

## 2017-09-12 DIAGNOSIS — R443 Hallucinations, unspecified: Secondary | ICD-10-CM

## 2017-09-12 DIAGNOSIS — R530 Neoplastic (malignant) related fatigue: Secondary | ICD-10-CM

## 2017-09-12 DIAGNOSIS — R112 Nausea with vomiting, unspecified: Secondary | ICD-10-CM

## 2017-09-12 LAB — CBC WITH DIFFERENTIAL/PLATELET
BASOS ABS: 0 10*3/uL (ref 0–0.1)
Basophils Relative: 1 %
Eosinophils Absolute: 0.1 10*3/uL (ref 0–0.7)
Eosinophils Relative: 8 %
HEMATOCRIT: 27.4 % — AB (ref 35.0–47.0)
HEMOGLOBIN: 9.2 g/dL — AB (ref 12.0–16.0)
LYMPHS ABS: 0.8 10*3/uL — AB (ref 1.0–3.6)
Lymphocytes Relative: 42 %
MCH: 33.1 pg (ref 26.0–34.0)
MCHC: 33.6 g/dL (ref 32.0–36.0)
MCV: 98.6 fL (ref 80.0–100.0)
Monocytes Absolute: 0.2 10*3/uL (ref 0.2–0.9)
Monocytes Relative: 10 %
NEUTROS ABS: 0.7 10*3/uL — AB (ref 1.4–6.5)
NEUTROS PCT: 39 %
Platelets: 96 10*3/uL — ABNORMAL LOW (ref 150–440)
RBC: 2.78 MIL/uL — AB (ref 3.80–5.20)
RDW: 18.2 % — ABNORMAL HIGH (ref 11.5–14.5)
WBC: 1.8 10*3/uL — AB (ref 3.6–11.0)

## 2017-09-12 LAB — COMPREHENSIVE METABOLIC PANEL
ALT: 9 U/L (ref 0–44)
ANION GAP: 8 (ref 5–15)
AST: 22 U/L (ref 15–41)
Albumin: 3 g/dL — ABNORMAL LOW (ref 3.5–5.0)
Alkaline Phosphatase: 42 U/L (ref 38–126)
BILIRUBIN TOTAL: 0.7 mg/dL (ref 0.3–1.2)
BUN: 12 mg/dL (ref 8–23)
CHLORIDE: 108 mmol/L (ref 98–111)
CO2: 23 mmol/L (ref 22–32)
Calcium: 8.9 mg/dL (ref 8.9–10.3)
Creatinine, Ser: 0.58 mg/dL (ref 0.44–1.00)
GFR calc Af Amer: >60 mL/min (ref >60)
Glucose, Bld: 96 mg/dL (ref 70–99)
Potassium: 3.1 mmol/L — ABNORMAL LOW (ref 3.5–5.1)
Sodium: 139 mmol/L (ref 135–145)
Total Protein: 6 g/dL — ABNORMAL LOW (ref 6.5–8.1)

## 2017-09-12 LAB — URINALYSIS, COMPLETE (UACMP) WITH MICROSCOPIC
Bilirubin Urine: NEGATIVE
Glucose, UA: NEGATIVE mg/dL
Hgb urine dipstick: NEGATIVE
Ketones, ur: NEGATIVE mg/dL
LEUKOCYTES UA: NEGATIVE
Nitrite: NEGATIVE
PH: 6 (ref 5.0–8.0)
Protein, ur: NEGATIVE mg/dL
Specific Gravity, Urine: 1.005 (ref 1.005–1.030)

## 2017-09-12 MED ORDER — SODIUM CHLORIDE 0.9 % IV SOLN
INTRAVENOUS | Status: DC
  Filled 2017-09-12: qty 250

## 2017-09-12 MED ORDER — HEPARIN SOD (PORK) LOCK FLUSH 100 UNIT/ML IV SOLN
INTRAVENOUS | Status: AC
  Filled 2017-09-12: qty 5

## 2017-09-12 MED ORDER — SODIUM CHLORIDE 0.9 % IV SOLN
INTRAVENOUS | Status: DC
  Administered 2017-09-12 (×2): via INTRAVENOUS
  Filled 2017-09-12 (×3): qty 250

## 2017-09-12 MED ORDER — TBO-FILGRASTIM 480 MCG/0.8ML ~~LOC~~ SOSY
480.0000 ug | PREFILLED_SYRINGE | Freq: Once | SUBCUTANEOUS | Status: AC
  Administered 2017-09-12: 480 ug via SUBCUTANEOUS

## 2017-09-12 MED ORDER — HEPARIN SOD (PORK) LOCK FLUSH 100 UNIT/ML IV SOLN
500.0000 [IU] | Freq: Once | INTRAVENOUS | Status: AC
  Administered 2017-09-12: 500 [IU]

## 2017-09-12 MED ORDER — SODIUM CHLORIDE 0.9% FLUSH
10.0000 mL | Freq: Once | INTRAVENOUS | Status: AC
  Administered 2017-09-12: 10 mL via INTRAVENOUS
  Filled 2017-09-12: qty 10

## 2017-09-12 MED ORDER — DEXAMETHASONE SODIUM PHOSPHATE 10 MG/ML IJ SOLN
10.0000 mg | Freq: Once | INTRAMUSCULAR | Status: AC
  Administered 2017-09-12: 10 mg via INTRAVENOUS
  Filled 2017-09-12: qty 1

## 2017-09-12 MED ORDER — POTASSIUM CHLORIDE CRYS ER 20 MEQ PO TBCR: 20.0000 meq | EXTENDED_RELEASE_TABLET | Freq: Two times a day (BID) | ORAL | 0 refills | Status: DC

## 2017-09-12 MED ORDER — LEVOFLOXACIN 500 MG PO TABS: 500.0000 mg | ORAL_TABLET | Freq: Every day | ORAL | 0 refills | Status: DC

## 2017-09-12 MED ORDER — ONDANSETRON HCL 4 MG/2ML IJ SOLN
8.0000 mg | Freq: Once | INTRAMUSCULAR | Status: AC
  Administered 2017-09-12: 8 mg via INTRAVENOUS
  Filled 2017-09-12: qty 4

## 2017-09-12 MED ORDER — TBO-FILGRASTIM 480 MCG/0.8ML ~~LOC~~ SOSY: 480.0000 ug | PREFILLED_SYRINGE | Freq: Once | SUBCUTANEOUS | Status: DC

## 2017-09-12 MED ORDER — SODIUM CHLORIDE 0.9 % IV SOLN: Freq: Once | INTRAVENOUS | Status: DC

## 2017-09-12 MED ORDER — OXYCODONE HCL 5 MG PO TABS
10.0000 mg | ORAL_TABLET | Freq: Once | ORAL | Status: AC
  Administered 2017-09-12: 10 mg via ORAL
  Filled 2017-09-12: qty 2

## 2017-09-12 NOTE — Progress Notes (Signed)
1407 - RN/CMA assisted pt to Bathroom for urine culture. Pt unable to collect specimen.  Patient finished with 1'st liter of fluids. V/o from [place], NP to administer 500 ml bag of Normal Saline at 999 ml/hr

## 2017-09-12 NOTE — Progress Notes (Signed)
Symptom Management Consult note Deanna Blair  [phone] [phone]  Patient Care Team: Deanna Blair as PCP - General (Family Medicine) Deanna Gee, MD as Referring Physician (Internal Medicine)   Name of the patient: Deanna Blair  [id number]  04-Oct-1955   Date of visit: 09/12/17  Diagnosis-metastatic adenocarcinoma of the Blair  Chief complaint/ Reason for visit-weakness/fever  Heme/Onc history: Patient was last seen by primary medical oncologist Dr. Rogue Blair on 09/07/2017 for follow-up after receiving cycle 1 gemcitabine on 08/31/17.  She appeared to have tolerated well except for mild myalgias.  Complained of nausea which improved with Ativan.  Continue to have grade 2 peripheral neuropathy and remain on gabapentin. They proceeded with cycle 1 day 8 gemcitabine.   Oncology History   # FEB 2017-Deanna CA Right Lower Blair [id number]- STAGE IIIB [Bronch & Subcarinal LNBx]- March 8th START CARBO-ALIMTA x4 cycles- June 8th 2017 - IMPROVED FDG MULTI-FOCAL lesions/N2-LN activity. S/p [place]-- Alimta x6  # AUG 18th- Alimta- Avastin maintenance; SEP 13th DISCONTINUE AVASTIN [sec to cavitary lesion]; OCT 20th 2017- CT-STABLE Disease; NOV 8th CT/PET [facility] clinic]- "overall slight progression-? Lymphangiectatic spread"  # Jan - April 2018Clement Husbands; progression  # April 25th 2018- [facility]- Taxol x3 ccyles; June 2018- CT chest STABLE Disease;July 25th- Add Avastin to [place] taxol x6 cycles- Sep 2018-CT scan- [facility] STABLE   # Sep 2018- Avastin Maintence;   JAN 9th CT- right Blair progression [s/p Bx- Deanna; [facility]; [facility]-one- 1/15]  # NOV 2nd 2017-MRI, [facility]- Brain mets [asymptomatic;Right frontal ~21m; ~380mlesions -appx 3-4 lesions; (right Frontal & Left parietal s/p GK x 2 lesions] ; June 2018- Progession vs radiation necrosis [July 2th-Add Avastin]  # [facility] 4 small subcentimeter brain lesions for which she had the Gamma  knife. The main right frontal lesion gotten smaller; improve edema.  # Jan 2019- Progression [CT chest; [facility]]; Brain MRI- new sub cm lesions- monitored  # feb 14th 2019- Tax-Cyram   --------------------------------------------------------------     MOLECULAR STUDIES:  KRAS MUTATED/Tissues not sufficient for OTHER the molecular marker; will need repeat Bx ; GUARDIANT TESTING [[facility]]- pending. [facility] No targettable mutations.  ------------------------------------------------------   DIAGNOSIS: [ FEB 2017]- Deanna Blair  STAGE:   IV      ;GOALS: palliative  CURRENT/MOST RECENT THERAPY- Tax-Ram      Primary cancer of right lower lobe of [place] ([facility]  08/30/2017 -  Chemotherapy    The patient had gemcitabine (GEMZAR) 1,7,262.0355g in sodium chloride 0.9 % 250 mL chemo infusion, 1,482 mg, Intravenous,  Once, 1 of 4 cycles Administration: 1,9,741.6384g (08/31/2017), 1,482 mg (09/07/2017)  for chemotherapy treatment.     Interval history-  WaSareena Odehresents with fevers up to 102.1 degrees.  She has had the fever for 2 days.   Symptoms have been gradually worsening.  Symptoms associated with the fever include abdominal pain, body aches and chills, and patient denies diarrhea, poor appetite and vomiting. Symptoms are worse at no particular time of day.   Symptoms have been gradually worsening since that time.  Patient has been restless.  Appetite has been worse. Urine output has been unchanged.  She has associated symptoms of status mental changes and hallucinations.  She has tried to alleviate the symptoms with acetaminophen with relief.    The patient has cancer, immunocompromised state of  recent chemo. Last given on 09/08/17. Daycare:no. Exposure to tobacco: no.    ECOG FS:1 - Symptomatic but completely ambulatory  Review of systems- Review of Systems  Constitutional:  Positive for chills, fever (T max 102.2) and malaise/fatigue. Negative for weight loss.  HENT: Negative for congestion, ear pain and tinnitus.   Eyes: Negative.  Negative for blurred vision and double vision.  Respiratory: Negative.  Negative for cough, sputum production and shortness of breath.   Cardiovascular: Negative.  Negative for chest pain, palpitations and leg swelling.  Gastrointestinal: Positive for abdominal pain, nausea and vomiting. Negative for constipation and diarrhea.  Genitourinary: Negative for dysuria, frequency and urgency.  Musculoskeletal: Positive for myalgias. Negative for back pain and falls.  Skin: Negative.  Negative for rash.  Neurological: Positive for dizziness and weakness. Negative for headaches.  Endo/Heme/Allergies: Negative.  Does not bruise/bleed easily.  Psychiatric/Behavioral: Negative.  Negative for depression. The patient is not nervous/anxious and does not have insomnia.     Current treatment- s/p cycle 1, day 8 Gemzar. Last given 09/07/17.  No Known Allergies   Past Medical History:  Diagnosis Date  . Anxiety   . Cancer of right Blair ([place]) 02/13/2015, 01/13/17  . Depression   . Depression with anxiety    Well controlled with Wellbutrin and Buspar  . Pneumonia      Past Surgical History:  Procedure Laterality Date  . ECTOPIC PREGNANCY SURGERY  1988  . ELECTROMAGNETIC NAVIGATION BROCHOSCOPY N/A 02/10/2015   Procedure: ELECTROMAGNETIC NAVIGATION BRONCHOSCOPY;  Surgeon: Flora Lipps, MD;  Location: [facility] ORS;  Service: Cardiopulmonary;  Laterality: N/A;  . ENDOBRONCHIAL ULTRASOUND N/A 02/10/2015   Procedure: ENDOBRONCHIAL ULTRASOUND;  Surgeon: Flora Lipps, MD;  Location: [facility] ORS;  Service: Cardiopulmonary;  Laterality: N/A;  . PERIPHERAL VASCULAR CATHETERIZATION N/A 03/05/2015   Procedure: Glori Luis Cath Insertion;  Surgeon: Katha Cabal, MD;  Location: [facility];  Service: Cardiovascular;  Laterality: N/A;    Social History    Socioeconomic History  . Marital status: Married    Spouse name: Herbie [place]  . Number of children: 1  . Years of education: 39  . Highest education level: Not on file  Occupational History  . 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 Financial resource strain: Not on file  . Food insecurity:    Worry: Not on file    Inability: Not on file  . Transportation needs:    Medical: Not on file    Non-medical: Not on file  Tobacco Use  . Smoking status: Former Smoker    Packs/day: 0.25    Years: 30.00    Pack years: 7.50    Types: Cigarettes    Last attempt to quit: 02/06/2005    Years since quitting: 12.6  . Smokeless tobacco: Never Used  Substance and Sexual Activity  . Alcohol use: Yes    Comment: not drank any in several months  . Drug use: No  . Sexual activity: Yes    Partners: Male  Lifestyle  . Physical activity:    Days per week: Not on file    Minutes per session: Not on file  . Stress: Not on file  Relationships  . Social connections:    Talks on phone: Not on file    Gets together: Not on file    Attends religious service: Not on file    Active member of club or organization: Not on file  Attends meetings of clubs or organizations: Not on file    Relationship status: Not on file  . Intimate partner violence:    Fear of current or ex partner: Not on file    Emotionally abused: Not on file    Physically abused: Not on file    Forced sexual activity: Not on file  Other Topics Concern  . Not on file  Social History Narrative   [name] was born in [place], [place]. She moved with her family with [facility] and lived there for 13 years. She recently moved to [place] approximately 1 year ago. She lives with her husband of 28 years. They have an adult daughter who lives in [place]. 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 She is enjoying driving around and getting to know [place]. She and her  husband enjoy cooking and baking. They also enjoy hiking when the weather permits.    Family History  Problem Relation Age of Onset  . Cancer Father        Prostate Cancer  . Hypertension Father   . Stroke Father   . Hypertension Brother      Current Outpatient Medications:  .  acetaminophen (TYLENOL) 500 MG tablet, Take 1,000 mg by mouth every 4 (four) hours as needed. , Disp: , Rfl:  .  dexamethasone (DECADRON) 4 MG tablet, Take 1 tablet (4 mg total) by mouth 2 (two) times daily with a meal. Start the day prior to chemo; for 3 days., Disp: 60 tablet, Rfl: 3 .  docusate sodium (COLACE) 100 MG capsule, Take 100 mg by mouth 2 (two) times daily., Disp: , Rfl:  .  fentaNYL (DURAGESIC - DOSED MCG/HR) 25 MCG/HR patch, Place 1 patch (25 mcg total) onto the skin every 3 (three) days., Disp: 10 patch, Rfl: 0 .  Fluticasone-Umeclidin-Vilant (TRELEGY ELLIPTA) 100-62.5-25 MCG/INH AEPB, Inhale 1 puff into the lungs daily., Disp: 1 each, Rfl: 4 .  ibuprofen (IBU) 400 MG tablet, Take 1 tablet (400 mg total) by mouth 3 (three) times daily as needed., Disp: 30 tablet, Rfl: 0 .  lidocaine-prilocaine (EMLA) cream, Apply 1 application topically as needed., Disp: 30 g, Rfl: 6 .  LORazepam (ATIVAN) 0.5 MG tablet, Take 1 tablet (0.5 mg total) by mouth 2 (two) times daily. As needed for anxiety/ nausea, Disp: 60 tablet, Rfl: 0 .  magic mouthwash w/lidocaine SOLN, Take 5 mLs by mouth 4 (four) times daily., Disp: 480 mL, Rfl: 3 .  ondansetron (ZOFRAN) 8 MG tablet, Take 1 tablet (8 mg total) by mouth every 8 (eight) hours as needed for nausea or vomiting (start 3 days; after chemo)., Disp: 40 tablet, Rfl: 3 .  Oxycodone HCl 10 MG TABS, Take 1 tablet (10 mg total) by mouth every 6 (six) hours as needed (moderate to severe pain)., Disp: 60 tablet, Rfl: 0 .  polyethylene glycol (MIRALAX / GLYCOLAX) packet, Take 17 g by mouth daily as needed., Disp: , Rfl:  .  prochlorperazine (COMPAZINE) 10 MG tablet, Take 1 tablet (10  mg total) by mouth every 6 (six) hours as needed for nausea or vomiting., Disp: 30 tablet, Rfl: 3 .  TRELEGY ELLIPTA 100-62.5-25 MCG/INH AEPB, INHALE 1 PUFF ONCE DAILY, Disp: 60 each, Rfl: 4 .  zolpidem (AMBIEN) 5 MG tablet, TAKE 1 TABLET BY MOUTH AT BEDTIME AS NEEDED FOR SLEEP, Disp: 30 tablet, Rfl: 2 .  levofloxacin (LEVAQUIN) 500 MG tablet, Take 1 tablet (500 mg total) by mouth daily., Disp: 10 tablet,  Rfl: 0 .  montelukast (SINGULAIR) 10 MG tablet, Take 1 tablet (10 mg total) by mouth at bedtime., Disp: 10 tablet, Rfl: 0 .  potassium chloride SA (K-DUR,KLOR-CON) 20 MEQ tablet, Take 1 tablet (20 mEq total) by mouth 2 (two) times daily., Disp: 10 tablet, Rfl: 0 No current facility-administered medications for this visit.   Facility-Administered Medications Ordered in Other Visits:  .  0.9 %  sodium chloride infusion, , Intravenous, Continuous, Makhi Muzquiz E, NP .  dexamethasone (DECADRON) injection 10 mg, 10 mg, Intravenous, Once, Jacquelin Hawking, NP  Physical exam:  Vitals:   09/12/17 1248  BP: 124/85  Pulse: 71  Resp: 14  Temp: 98.2 F (36.8 C)  TempSrc: Oral   Physical Exam  Constitutional: She is oriented to person, place, and time. Vital signs are normal. She appears lethargic. She has a sickly appearance.  HENT:  Head: Normocephalic and atraumatic.  Eyes: Pupils are equal, round, and reactive to light.  Neck: Normal range of motion.  Cardiovascular: Normal rate, regular rhythm and normal heart sounds.  No murmur heard. Pulmonary/Chest: Effort normal and breath sounds normal. She has no wheezes.  Abdominal: Soft. Normal appearance and bowel sounds are normal. She exhibits no distension. There is no hepatosplenomegaly. There is tenderness in the right upper quadrant.  Musculoskeletal: Normal range of motion. She exhibits no edema.  Neurological: She is oriented to person, place, and time. She appears lethargic.  Skin: Skin is warm and dry. No rash noted.  Psychiatric:  Judgment normal.     CMP Latest Ref Rng & Units 09/12/2017  Glucose 70 - 99 mg/dL 96  BUN 8 - 23 mg/dL 12  Creatinine 0.44 - 1.00 mg/dL 0.58  Sodium 135 - 145 mmol/L 139  Potassium 3.5 - 5.1 mmol/L 3.1(L)  Chloride 98 - 111 mmol/L 108  CO2 22 - 32 mmol/L 23  Calcium 8.9 - 10.3 mg/dL 8.9  Total Protein 6.5 - 8.1 g/dL 6.0(L)  Total Bilirubin 0.3 - 1.2 mg/dL 0.7  Alkaline Phos 38 - 126 U/L 42  AST 15 - 41 U/L 22  ALT 0 - 44 U/L 9   CBC Latest Ref Rng & Units 09/12/2017  WBC 3.6 - 11.0 K/uL 1.8(L)  Hemoglobin 12.0 - 16.0 g/dL 9.2(L)  Hematocrit 35.0 - 47.0 % 27.4(L)  Platelets 150 - 440 K/uL 96(L)    No images are attached to the encounter.  No results found.  Assessment and plan- Patient is a 62 y.o. female who presents for fever, lethargy, hallucinations and abdominal pain.  1.  Blair cancer: s/p Cycle 1 day 8 gemcitabine q 21 days.  Last given on 09/07/2017.  Tolerated first cycle well with only mild myalgias. Scheduled to RTC 09/21/17 to see Dr. Rogue Blair for Cycle 2.   2.  Fever of unknown orgin: Tmax 102.2 on Saturday. Afebrile today. Will check CBC, CMET, blood cultures and UA/Urine Culture. Lungs are clear.  She is neutropenic with an ANC of 0.7. Gave 1.5 liters NaCl d/t dehydration. She has been unable to eat or drink for several days d/t lethargy, fatigue and decreased appetite. Will begin Levaquin 500 mg daily for 10 days. Will also start Singulair 10 mg daily for the next week. Gemcitabine alone can cause fevers but is typically closer to administration to the medication which was given on 09/07/17 . Patient has no known drug allergies.   3. Neutropenia: Will give patient 480 mcg of Granix today and again on Wednesday.  Appt made for 1:30  on 09/14/17.  4.  Hallucinations: History of brain mets. Will r/o UTI and give 1.5 liter NaCl for dehydration.  UA not overly convincing for urinary tract infection.  Will wait for culture.   5.  Hypokalemia: Potassium 3.1 today. RX 20 mEq twice  daily for 5 days. Will recheck labs on Wednesday.    6.  Nausea without vomiting: Likely d/t chemo. Has had stable brain mets. Taking anti-emetics as prescribed.  We will give 8 mg Zofran and 10 mg Decadron while in clinic today.  Improved with fluids, antiemetics and steroids. Continue to monitor.   7.  Right upper quadrant abdominal pain: Non acute and intermittent. With deep inhalation. Oxygen saturations 95% on RA. Blood pressure and heart rate stable. Unlikely, this is a PE.  Continue pain medications as prescribed. Gave 10 mg Oxycodone tablet while in clinic for 8/10 abdominal pain radiating to bilateral flanks. UTI negative so far. Will continue to monitor.  Visit Diagnosis 1. Neutropenia associated with infection (HCC)   2. Dehydration   3. Hypokalemia   4. Nausea and vomiting, intractability of vomiting not specified, unspecified vomiting type     Patient expressed understanding and was in agreement with this plan. She also understands that She can call clinic at any time with any questions, concerns, or complaints.   Greater than 50% was spent in counseling and coordination of care with this patient including but not limited to discussion of the relevant topics above (See A&P) including, but not limited to diagnosis and management of acute and chronic medical conditions.    Faythe Casa, AGNP-C [facility] [place] [facility] [place]- [phone] Pager- [phone] 09/13/2017 11:05 AM

## 2017-09-12 NOTE — Telephone Encounter (Signed)
Husband called to report that patient has been having fever 103 and that her body aches all over. The fever is down now, but she continues to ache really bad. Discussed with [facility] NP and lab and NP appointment accepted for this afternoon (patient request for later appointment)

## 2017-09-13 MED ORDER — MONTELUKAST SODIUM 10 MG PO TABS: 10.0000 mg | ORAL_TABLET | Freq: Every day | ORAL | 0 refills | Status: DC

## 2017-09-14 ENCOUNTER — Inpatient Hospital Stay (HOSPITAL_BASED_OUTPATIENT_CLINIC_OR_DEPARTMENT_OTHER): Payer: BLUE CROSS/BLUE SHIELD | Admitting: Oncology

## 2017-09-14 ENCOUNTER — Inpatient Hospital Stay: Payer: BLUE CROSS/BLUE SHIELD

## 2017-09-14 DIAGNOSIS — R110 Nausea: Secondary | ICD-10-CM

## 2017-09-14 DIAGNOSIS — D701 Agranulocytosis secondary to cancer chemotherapy: Secondary | ICD-10-CM

## 2017-09-14 DIAGNOSIS — R509 Fever, unspecified: Secondary | ICD-10-CM

## 2017-09-14 DIAGNOSIS — R443 Hallucinations, unspecified: Secondary | ICD-10-CM

## 2017-09-14 DIAGNOSIS — Z8042 Family history of malignant neoplasm of prostate: Secondary | ICD-10-CM

## 2017-09-14 DIAGNOSIS — E876 Hypokalemia: Secondary | ICD-10-CM

## 2017-09-14 DIAGNOSIS — F418 Other specified anxiety disorders: Secondary | ICD-10-CM

## 2017-09-14 DIAGNOSIS — E860 Dehydration: Secondary | ICD-10-CM

## 2017-09-14 DIAGNOSIS — R0981 Nasal congestion: Secondary | ICD-10-CM

## 2017-09-14 DIAGNOSIS — R112 Nausea with vomiting, unspecified: Secondary | ICD-10-CM

## 2017-09-14 DIAGNOSIS — K1231 Oral mucositis (ulcerative) due to antineoplastic therapy: Secondary | ICD-10-CM

## 2017-09-14 DIAGNOSIS — R1011 Right upper quadrant pain: Secondary | ICD-10-CM

## 2017-09-14 DIAGNOSIS — D6481 Anemia due to antineoplastic chemotherapy: Secondary | ICD-10-CM

## 2017-09-14 DIAGNOSIS — R4182 Altered mental status, unspecified: Secondary | ICD-10-CM

## 2017-09-14 DIAGNOSIS — G620 Drug-induced polyneuropathy: Secondary | ICD-10-CM

## 2017-09-14 DIAGNOSIS — C3431 Malignant neoplasm of lower lobe, right bronchus or lung: Secondary | ICD-10-CM

## 2017-09-14 DIAGNOSIS — Z79899 Other long term (current) drug therapy: Secondary | ICD-10-CM

## 2017-09-14 DIAGNOSIS — T451X5A Adverse effect of antineoplastic and immunosuppressive drugs, initial encounter: Principal | ICD-10-CM

## 2017-09-14 DIAGNOSIS — R530 Neoplastic (malignant) related fatigue: Secondary | ICD-10-CM

## 2017-09-14 DIAGNOSIS — Z87891 Personal history of nicotine dependence: Secondary | ICD-10-CM

## 2017-09-14 DIAGNOSIS — R05 Cough: Secondary | ICD-10-CM

## 2017-09-14 DIAGNOSIS — D696 Thrombocytopenia, unspecified: Secondary | ICD-10-CM

## 2017-09-14 DIAGNOSIS — Z8249 Family history of ischemic heart disease and other diseases of the circulatory system: Secondary | ICD-10-CM

## 2017-09-14 DIAGNOSIS — R059 Cough, unspecified: Secondary | ICD-10-CM

## 2017-09-14 DIAGNOSIS — C7931 Secondary malignant neoplasm of brain: Secondary | ICD-10-CM

## 2017-09-14 LAB — CBC WITH DIFFERENTIAL/PLATELET
BASOS ABS: 0 10*3/uL (ref 0–0.1)
Basophils Relative: 0 %
EOS ABS: 0 10*3/uL (ref 0–0.7)
Eosinophils Relative: 0 %
HCT: 29 % — ABNORMAL LOW (ref 35.0–47.0)
Hemoglobin: 9.7 g/dL — ABNORMAL LOW (ref 12.0–16.0)
Lymphocytes Relative: 8 %
Lymphs Abs: 2.1 10*3/uL (ref 1.0–3.6)
MCH: 33.4 pg (ref 26.0–34.0)
MCHC: 33.3 g/dL (ref 32.0–36.0)
MCV: 100.3 fL — ABNORMAL HIGH (ref 80.0–100.0)
Monocytes Absolute: 1.9 10*3/uL — ABNORMAL HIGH (ref 0.2–0.9)
Monocytes Relative: 7 %
Neutro Abs: 21.8 10*3/uL — ABNORMAL HIGH (ref 1.4–6.5)
Neutrophils Relative %: 85 %
PLATELETS: 69 10*3/uL — AB (ref 150–440)
RBC: 2.89 MIL/uL — AB (ref 3.80–5.20)
RDW: 18.5 % — ABNORMAL HIGH (ref 11.5–14.5)
WBC: 25.9 10*3/uL — AB (ref 3.6–11.0)

## 2017-09-14 LAB — URINE CULTURE: Culture: NO GROWTH

## 2017-09-14 MED ORDER — ONDANSETRON HCL 4 MG/2ML IJ SOLN
8.0000 mg | Freq: Once | INTRAMUSCULAR | Status: AC
  Administered 2017-09-14: 8 mg via INTRAVENOUS
  Filled 2017-09-14: qty 4

## 2017-09-14 MED ORDER — SODIUM CHLORIDE 0.9 % IV SOLN: Freq: Once | INTRAVENOUS | Status: DC

## 2017-09-14 MED ORDER — SODIUM CHLORIDE 0.9 % IV SOLN
Freq: Once | INTRAVENOUS | Status: AC
  Administered 2017-09-14: 14:00:00 via INTRAVENOUS
  Filled 2017-09-14: qty 250

## 2017-09-14 MED ORDER — DEXAMETHASONE SODIUM PHOSPHATE 10 MG/ML IJ SOLN
10.0000 mg | Freq: Once | INTRAMUSCULAR | Status: AC
  Administered 2017-09-14: 10 mg via INTRAVENOUS
  Filled 2017-09-14: qty 1

## 2017-09-14 MED ORDER — HEPARIN SOD (PORK) LOCK FLUSH 100 UNIT/ML IV SOLN
500.0000 [IU] | Freq: Once | INTRAVENOUS | Status: AC | PRN
  Administered 2017-09-14: 500 [IU]
  Filled 2017-09-14: qty 5

## 2017-09-14 MED ORDER — SODIUM CHLORIDE 0.9 % IV SOLN: 10.0000 mg | Freq: Once | INTRAVENOUS | Status: DC

## 2017-09-14 NOTE — Progress Notes (Signed)
Symptom Management Consult note [facility]  [phone] [phone]  Patient Care Team: Rubye Beach as PCP - General (Family Medicine) Allyne Gee, MD as Referring Physician (Internal Medicine)   Name of the patient: Deanna Blair  [id number]  [birth year]   Date of visit:09/14/17  Diagnosis-metastatic adenocarcinoma of the lung  Chief complaint/ Reason for visit- dehydration follow-up  Heme/Onc history: Patient was seen in [facility] on 09/12/2016 for weakness and fever X 2 days.  She was afebrile at visit.  She had status mental changes with hallucinations.  She is status post cycle 1 day 8 Gemzar.  Last given on 09/07/2017.  She is found to be neutropenic with an ANC of 0.7.  She was given 1.5 L NaCl with 10 mg Decadron and 8 mg Zofran for nausea.  She was lethargic.  She was started on Levaquin 500 mg daily for 10 days.  She was also started on Singulair 10 mg daily for the next week.  She was given 480 mcg Granix subcutaneous. Unclear fever etiology.  UA and urine culture were negative.  Blood cultures so far are negative (> 48 hours).   Oncology History   # FEB 2017-ADENO CA Right Lower Lung T4N2M0- STAGE IIIB [Bronch & Subcarinal LNBx]- March 8th START CARBO-ALIMTA x4 cycles- June 8th 2017 - IMPROVED FDG MULTI-FOCAL lesions/N2-LN activity. S/p [place]-- Alimta x6  # AUG 18th- Alimta- Avastin maintenance; SEP 13th DISCONTINUE AVASTIN [sec to cavitary lesion]; OCT 20th 2017- CT-STABLE Disease; NOV 8th CT/PET [facility]]- "overall slight progression-? 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 The main right frontal lesion gotten smaller; improve edema.  # Jan 2019- Progression [CT chest; [facility]]; Brain MRI- new sub cm lesions- monitored  # feb 14th 2019- Tax-Cyram   --------------------------------------------------------------     MOLECULAR STUDIES:  KRAS MUTATED/Tissues not sufficient for OTHER the molecular marker; will need repeat Bx ; GUARDIANT TESTING [[facility]]- pending. # [facility] No targettable mutations.  ------------------------------------------------------   DIAGNOSIS: [ FEB 2017]- ADENO CA LUNG  STAGE:   IV      ;GOALS: palliative  CURRENT/MOST RECENT THERAPY- Tax-Ram      Primary cancer of right lower lobe of lung ([place]  08/30/2017 -  Chemotherapy    The patient had gemcitabine (GEMZAR) 1,3,094.0768g in sodium chloride 0.9 % 250 mL chemo infusion, 1,482 mg, Intravenous,  Once, 1 of 4 cycles Administration: 1,0,881.1031g (08/31/2017), 1,482 mg (09/07/2017)  for chemotherapy treatment.      Interval history-  Patient returns to clinic today for reevaluation of labs and vital signs.  She continues to complain of chills, nasal congestion and nonproductive cough.  Weakness has improved. Vitals have improved. Symptoms began 4 days ago.  The cough is non-productive, without  wheezing, dyspnea or hemoptysis and is aggravated by nothing Associated symptoms include:None. Patient does not have a history of asthma. Patient does not have a history of environmental allergens. Patient does have a history of smoking. Patient  has not previous Chest X-ray. Denies any neurologic complaints. No further  hallucinations. Denies any easy bleeding or bruising. Reports improving appetite and denies weight loss. Denies chest pain. Denies any constipation, or diarrhea. Had formed BM 2 days ago. Denies urinary complaints. Patient offers no further specific complaints today.   ECOG FS:1 - Symptomatic but completely ambulatory  Review of systems- Review of Systems  Constitutional: Positive for chills, fever (last on SAturday night), malaise/fatigue and weight loss.  HENT: Negative for congestion, ear pain and tinnitus.   Eyes: Negative.  Negative for blurred vision and double vision.  Respiratory: Positive for cough. Negative for sputum production and shortness of breath.   Cardiovascular: Negative.  Negative for chest pain, palpitations and leg swelling.  Gastrointestinal: Positive for nausea and vomiting. Negative for abdominal pain, constipation and diarrhea.  Genitourinary: Negative for dysuria, frequency and urgency.  Musculoskeletal: Negative for back pain and falls.  Skin: Negative.  Negative for rash.  Neurological: Positive for weakness (improving). Negative for headaches.  Endo/Heme/Allergies: Negative.  Does not bruise/bleed easily.  Psychiatric/Behavioral: Positive for hallucinations. Negative for depression. The patient is nervous/anxious and has insomnia.      Current treatment- s/p cycle 1 day 8 gemcitabine q. 21 days.  Last given on 09/07/2016. No Known Allergies   Past Medical History:  Diagnosis Date  . Anxiety   . Cancer of right lung ([place]) 02/13/2015, 01/13/17  . Depression   . Depression with anxiety    Well controlled with Wellbutrin and Buspar  . Pneumonia      Past Surgical History:  Procedure Laterality Date  . ECTOPIC PREGNANCY SURGERY  1988  . ELECTROMAGNETIC NAVIGATION BROCHOSCOPY N/A 02/10/2015   Procedure: ELECTROMAGNETIC NAVIGATION BRONCHOSCOPY;  Surgeon: Flora Lipps, MD;  Location: [facility] ORS;  Service: Cardiopulmonary;  Laterality: N/A;  . ENDOBRONCHIAL  ULTRASOUND N/A 02/10/2015   Procedure: ENDOBRONCHIAL ULTRASOUND;  Surgeon: Flora Lipps, MD;  Location: [facility] ORS;  Service: Cardiopulmonary;  Laterality: N/A;  . PERIPHERAL VASCULAR CATHETERIZATION N/A 03/05/2015   Procedure: Glori Luis Cath Insertion;  Surgeon: Katha Cabal, MD;  Location: [facility];  Service: Cardiovascular;  Laterality: N/A;    Social History   Socioeconomic History  . Marital status: Married    Spouse name: Herbie Baltimore  . Number of children: 1  . Years of education: 77  . Highest education level: Not on file  Occupational History  . 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 Financial resource strain: Not on file  . Food insecurity:    Worry: Not on file    Inability: Not on file  . Transportation needs:    Medical: Not on file    Non-medical: Not on file  Tobacco Use  . Smoking status: Former Smoker    Packs/day: 0.25    Years: 30.00    Pack years: 7.50    Types: Cigarettes    Last attempt to quit: 02/06/2005    Years since quitting: 12.6  . Smokeless tobacco: Never Used  Substance and Sexual Activity  . Alcohol use: Yes    Comment: not drank any in several months  . Drug use: No  . Sexual activity: Yes    Partners: Male  Lifestyle  .  Physical activity:    Days per week: Not on file    Minutes per session: Not on file  . Stress: Not on file  Relationships  . Social connections:    Talks on phone: Not on file    Gets together: Not on file    Attends religious service: Not on file    Active member of club or organization: Not on file    Attends meetings of clubs or organizations: Not on file    Relationship status: Not on file  . Intimate partner violence:    Fear of current or ex partner: Not on file    Emotionally abused: Not on file    Physically abused: Not on file    Forced sexual activity: Not on file  Other Topics Concern  . Not on file  Social History Narrative   [name] was born in  [place], [place]. She moved with her family with [facility] and lived there for 13 years. She recently moved to [place] approximately 1 year ago. She lives with her husband of 28 years. They have an adult daughter who lives in [place]. 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 She is enjoying driving around and getting to know [place]. She and her husband enjoy cooking and baking. They also enjoy hiking when the weather permits.    Family History  Problem Relation Age of Onset  . Cancer Father        Prostate Cancer  . Hypertension Father   . Stroke Father   . Hypertension Brother      Current Outpatient Medications:  .  acetaminophen (TYLENOL) 500 MG tablet, Take 1,000 mg by mouth every 4 (four) hours as needed. , Disp: , Rfl:  .  dexamethasone (DECADRON) 4 MG tablet, Take 1 tablet (4 mg total) by mouth 2 (two) times daily with a meal. Start the day prior to chemo; for 3 days., Disp: 60 tablet, Rfl: 3 .  docusate sodium (COLACE) 100 MG capsule, Take 100 mg by mouth 2 (two) times daily., Disp: , Rfl:  .  fentaNYL (DURAGESIC - DOSED MCG/HR) 25 MCG/HR patch, Place 1 patch (25 mcg total) onto the skin every 3 (three) days., Disp: 10 patch, Rfl: 0 .  Fluticasone-Umeclidin-Vilant (TRELEGY ELLIPTA) 100-62.5-25 MCG/INH AEPB, Inhale 1 puff into the lungs daily., Disp: 1 each, Rfl: 4 .  ibuprofen (IBU) 400 MG tablet, Take 1 tablet (400 mg total) by mouth 3 (three) times daily as needed., Disp: 30 tablet, Rfl: 0 .  levofloxacin (LEVAQUIN) 500 MG tablet, Take 1 tablet (500 mg total) by mouth daily., Disp: 10 tablet, Rfl: 0 .  lidocaine-prilocaine (EMLA) cream, Apply 1 application topically as needed., Disp: 30 g, Rfl: 6 .  LORazepam (ATIVAN) 0.5 MG tablet, Take 1 tablet (0.5 mg total) by mouth 2 (two) times daily. As needed for anxiety/ nausea, Disp: 60 tablet, Rfl: 0 .  magic mouthwash w/lidocaine SOLN, Take 5 mLs by mouth 4 (four) times daily., Disp: 480 mL, Rfl: 3 .   montelukast (SINGULAIR) 10 MG tablet, Take 1 tablet (10 mg total) by mouth at bedtime., Disp: 10 tablet, Rfl: 0 .  ondansetron (ZOFRAN) 8 MG tablet, Take 1 tablet (8 mg total) by mouth every 8 (eight) hours as needed for nausea or vomiting (start 3 days; after chemo)., Disp: 40 tablet, Rfl: 3 .  Oxycodone HCl 10 MG TABS, Take 1 tablet (10 mg total) by mouth every 6 (six) hours as needed (moderate to  severe pain)., Disp: 60 tablet, Rfl: 0 .  polyethylene glycol (MIRALAX / GLYCOLAX) packet, Take 17 g by mouth daily as needed., Disp: , Rfl:  .  potassium chloride SA (K-DUR,KLOR-CON) 20 MEQ tablet, Take 1 tablet (20 mEq total) by mouth 2 (two) times daily., Disp: 10 tablet, Rfl: 0 .  prochlorperazine (COMPAZINE) 10 MG tablet, Take 1 tablet (10 mg total) by mouth every 6 (six) hours as needed for nausea or vomiting., Disp: 30 tablet, Rfl: 3 .  TRELEGY ELLIPTA 100-62.5-25 MCG/INH AEPB, INHALE 1 PUFF ONCE DAILY, Disp: 60 each, Rfl: 4 .  zolpidem (AMBIEN) 5 MG tablet, TAKE 1 TABLET BY MOUTH AT BEDTIME AS NEEDED FOR SLEEP, Disp: 30 tablet, Rfl: 2 No current facility-administered medications for this visit.   Facility-Administered Medications Ordered in Other Visits:  .  0.9 %  sodium chloride infusion, , Intravenous, Continuous, Burns, Jennifer E, NP .  dexamethasone (DECADRON) injection 10 mg, 10 mg, Intravenous, Once, Burns, Wandra Feinstein, NP  Physical exam: There were no vitals filed for this visit. Physical Exam  Constitutional: She is oriented to person, place, and time. Vital signs are normal.  HENT:  Head: Normocephalic and atraumatic.  Eyes: Pupils are equal, round, and reactive to light.  Neck: Normal range of motion.  Cardiovascular: Normal rate, regular rhythm and normal heart sounds.  No murmur heard. Pulmonary/Chest: Effort normal. She has decreased breath sounds in the right middle field and the right lower field. She has no wheezes. She has rhonchi in the right middle field.  Abdominal:  Soft. Normal appearance and bowel sounds are normal. She exhibits no distension. There is no tenderness.  Musculoskeletal: Normal range of motion. She exhibits no edema.  Neurological: She is alert and oriented to person, place, and time.  Skin: Skin is warm and dry. No rash noted.  Psychiatric: Judgment normal.     CMP Latest Ref Rng & Units 09/12/2017  Glucose 70 - 99 mg/dL 96  BUN 8 - 23 mg/dL 12  Creatinine 0.44 - 1.00 mg/dL 0.58  Sodium 135 - 145 mmol/L 139  Potassium 3.5 - 5.1 mmol/L 3.1(L)  Chloride 98 - 111 mmol/L 108  CO2 22 - 32 mmol/L 23  Calcium 8.9 - 10.3 mg/dL 8.9  Total Protein 6.5 - 8.1 g/dL 6.0(L)  Total Bilirubin 0.3 - 1.2 mg/dL 0.7  Alkaline Phos 38 - 126 U/L 42  AST 15 - 41 U/L 22  ALT 0 - 44 U/L 9   CBC Latest Ref Rng & Units 09/14/2017  WBC 3.6 - 11.0 K/uL 25.9(H)  Hemoglobin 12.0 - 16.0 g/dL 9.7(L)  Hematocrit 35.0 - 47.0 % 29.0(L)  Platelets 150 - 440 K/uL 69(L)    No images are attached to the encounter.  Dg Chest 2 View  Result Date: 09/15/2017 CLINICAL DATA:  Cough for 2 days, history of lung carcinoma, recent fever EXAM: CHEST - 2 VIEW COMPARISON:  CT abdomen pelvis from outside institution dated 01/13/2016, and chest x-ray of 03/31/2015 FINDINGS: There is more parenchymal opacity in the right mid lung and right lung base. Patient has a known malignancy at the right lung base and some of this opacity could well be due to radiation fibrosis. However superimposed pneumonia is a definite consideration and follow-up chest x-ray is recommended. No definite pleural effusion is seen. The left lung appears clear. Right-sided Port-A-Cath tip is present overlying the lower SVC and the heart is mildly enlarged and stable. No acute bony abnormality is seen. It would be helpful  to have the dictation from the CT abdomen pelvis from [facility] dated 01/12/2017, as well as any chest x-rays and dictated reports. IMPRESSION: 1. Parenchymal opacity within the right mid lung  and right lung base possibly due to radiation fibrosis, but superimposed pneumonia cannot be excluded. Correlation with any more recent chest x-ray possibly from [facility] would be helpful. 2. The left lung appears clear. 3. Port-A-Cath tip overlies the lower SVC. Electronically Signed   By: Ivar Drape M.D.   On: 09/15/2017 11:11     Assessment and plan- Patient is a 62 y.o. female who presents for follow-up with labs and assessment.  1.  Lung cancer: s/p cycle 1 day 8 gemcitabine q. 21 days.  Last given on 09/07/2016.  Tolerated first cycle well with only mild myalgias.  Seen in [facility] on 09/12/2017 with fevers of unknown origin and neutropenia.  She was given 1.5 L NaCl, 8 mg Zofran, 10 mg Decadron and Granix.  Scheduled for second dose of Granix today.  She will return to clinic on 09/21/2017 to see Dr. Rogue Bussing for assessment and cycle 2 of gemcitabine.  This likely will be pushed back.  2.  Nonproductive cough: States this is intermittent.  No hemoptysis.  Denies any further fevers since beginning Levaquin on Monday.  Will get stat chest x-ray.  Unfortunately, patient unable to do chest x-ray this afternoon d/t transportation issues.  States she will return to clinic tomorrow morning for chest x-ray.  3.  Fever: Afebrile today. Blood cultures negative to date.  Urinalysis and urine culture negative.  Will get chest x-ray for full work-up.  Gemcitabine alone can cause fever.  Started on Levaquin on Monday 500 mg twice daily for 10 days.  Will give 1 L NaCl with 8 mg Zofran and 10 mg Decadron in clinic.   4.  Neutropenia/leukocytosis: Received Granix 480 mcg on Monday.  She does not need repeat dose of Granix today.  No fevers.  5.  Nausea with vomiting: Likely due to chemo.  Has stable brain mets.  Taking antiemetics as prescribed.  Received 8 mg Zofran and 10 mg Decadron with fluids today.  Instructed to continue antiemetics as prescribed.  6.  Abdominal pain/bony pain: likely d/t granix and chemo.  Continue pain medication as needed.  Added Singulair 10 mg daily for the next week.   7. Anemia: Hemoglobin 9.7. likely due to chemotherapy.  Continue to monitor.  8.  Thrombocytopenia. D/t chemotherapy.  Continue to monitor.  9.  Hallucinations: Have improved.  Pushing fluids at home.  Will give 1 L NaCl as mentioned above.    Visit Diagnosis 1. Cough   2. Dehydration   3. Nausea without vomiting     Patient expressed understanding and was in agreement with this plan. She also understands that She can call clinic at any time with any questions, concerns, or complaints.   Greater than 50% was spent in counseling and coordination of care with this patient including but not limited to discussion of the relevant topics above (See A&P) including, but not limited to diagnosis and management of acute and chronic medical conditions.    Faythe Casa, AGNP-C [facility] at [facility] Cell - [phone] Pager- [phone] 09/15/2017 11:24 AM

## 2017-09-14 NOTE — Progress Notes (Signed)
Per Lennon Alstrom., NP, no Granix today.

## 2017-09-15 ENCOUNTER — Ambulatory Visit
Admission: RE | Admit: 2017-09-15 | Discharge: 2017-09-15 | Disposition: A | Discharge status: Home / Self Care | Payer: BLUE CROSS/BLUE SHIELD | Source: Ambulatory Visit | Attending: Oncology | Admitting: Oncology

## 2017-09-15 DIAGNOSIS — R05 Cough: Secondary | ICD-10-CM

## 2017-09-15 DIAGNOSIS — R918 Other nonspecific abnormal finding of lung field: Secondary | ICD-10-CM | POA: Insufficient documentation

## 2017-09-15 DIAGNOSIS — R059 Cough, unspecified: Secondary | ICD-10-CM

## 2017-09-17 LAB — CULTURE, BLOOD (ROUTINE X 2)
CULTURE: NO GROWTH
CULTURE: NO GROWTH
SPECIAL REQUESTS: ADEQUATE
Special Requests: ADEQUATE

## 2017-09-19 ENCOUNTER — Other Ambulatory Visit: Payer: Self-pay

## 2017-09-19 ENCOUNTER — Encounter: Payer: Self-pay | Admitting: Oncology

## 2017-09-19 ENCOUNTER — Telehealth: Payer: Self-pay | Admitting: *Deleted

## 2017-09-19 ENCOUNTER — Inpatient Hospital Stay: Payer: BLUE CROSS/BLUE SHIELD

## 2017-09-19 ENCOUNTER — Inpatient Hospital Stay (HOSPITAL_BASED_OUTPATIENT_CLINIC_OR_DEPARTMENT_OTHER): Payer: BLUE CROSS/BLUE SHIELD | Admitting: Oncology

## 2017-09-19 VITALS — BP 101/64 | HR 99 | Temp 99.6°F | Resp 16

## 2017-09-19 DIAGNOSIS — R112 Nausea with vomiting, unspecified: Secondary | ICD-10-CM

## 2017-09-19 DIAGNOSIS — C3431 Malignant neoplasm of lower lobe, right bronchus or lung: Secondary | ICD-10-CM

## 2017-09-19 DIAGNOSIS — E860 Dehydration: Secondary | ICD-10-CM

## 2017-09-19 DIAGNOSIS — R059 Cough, unspecified: Secondary | ICD-10-CM

## 2017-09-19 DIAGNOSIS — C7931 Secondary malignant neoplasm of brain: Secondary | ICD-10-CM | POA: Diagnosis not present

## 2017-09-19 DIAGNOSIS — R110 Nausea: Secondary | ICD-10-CM

## 2017-09-19 DIAGNOSIS — R05 Cough: Secondary | ICD-10-CM

## 2017-09-19 DIAGNOSIS — Z95828 Presence of other vascular implants and grafts: Secondary | ICD-10-CM

## 2017-09-19 DIAGNOSIS — R509 Fever, unspecified: Secondary | ICD-10-CM | POA: Diagnosis not present

## 2017-09-19 LAB — CBC WITH DIFFERENTIAL/PLATELET
BASOS ABS: 0.1 10*3/uL (ref 0–0.1)
BASOS PCT: 1 %
EOS ABS: 0.1 10*3/uL (ref 0–0.7)
Eosinophils Relative: 1 %
HEMATOCRIT: 32.6 % — AB (ref 35.0–47.0)
HEMOGLOBIN: 11 g/dL — AB (ref 12.0–16.0)
Lymphocytes Relative: 11 %
Lymphs Abs: 1 10*3/uL (ref 1.0–3.6)
MCH: 33.9 pg (ref 26.0–34.0)
MCHC: 33.7 g/dL (ref 32.0–36.0)
MCV: 100.8 fL — ABNORMAL HIGH (ref 80.0–100.0)
Monocytes Absolute: 1.4 10*3/uL — ABNORMAL HIGH (ref 0.2–0.9)
Monocytes Relative: 15 %
NEUTROS ABS: 6.7 10*3/uL — AB (ref 1.4–6.5)
NEUTROS PCT: 72 %
Platelets: 251 10*3/uL (ref 150–440)
RBC: 3.24 MIL/uL — AB (ref 3.80–5.20)
RDW: 20 % — ABNORMAL HIGH (ref 11.5–14.5)
WBC: 9.2 10*3/uL (ref 3.6–11.0)

## 2017-09-19 LAB — COMPREHENSIVE METABOLIC PANEL
ALBUMIN: 3.3 g/dL — AB (ref 3.5–5.0)
ALT: 11 U/L (ref 0–44)
AST: 23 U/L (ref 15–41)
Alkaline Phosphatase: 64 U/L (ref 38–126)
Anion gap: 12 (ref 5–15)
BILIRUBIN TOTAL: 0.6 mg/dL (ref 0.3–1.2)
BUN: 15 mg/dL (ref 8–23)
CHLORIDE: 103 mmol/L (ref 98–111)
CO2: 21 mmol/L — ABNORMAL LOW (ref 22–32)
CREATININE: 0.81 mg/dL (ref 0.44–1.00)
Calcium: 9.2 mg/dL (ref 8.9–10.3)
GFR calc Af Amer: >60 mL/min (ref >60)
GFR calc non Af Amer: >60 mL/min (ref >60)
GLUCOSE: 117 mg/dL — AB (ref 70–99)
Potassium: 3.9 mmol/L (ref 3.5–5.1)
Sodium: 136 mmol/L (ref 135–145)
TOTAL PROTEIN: 6.7 g/dL (ref 6.5–8.1)

## 2017-09-19 LAB — MAGNESIUM: Magnesium: 1.7 mg/dL (ref 1.7–2.4)

## 2017-09-19 MED ORDER — SODIUM CHLORIDE 0.9 % IV SOLN
Freq: Once | INTRAVENOUS | Status: AC
  Administered 2017-09-19: 14:00:00 via INTRAVENOUS
  Filled 2017-09-19: qty 250

## 2017-09-19 MED ORDER — SODIUM CHLORIDE 0.9% FLUSH
10.0000 mL | INTRAVENOUS | Status: DC | PRN
  Administered 2017-09-19: 10 mL via INTRAVENOUS
  Filled 2017-09-19: qty 10

## 2017-09-19 MED ORDER — ONDANSETRON HCL 4 MG/2ML IJ SOLN
8.0000 mg | Freq: Once | INTRAMUSCULAR | Status: AC
  Administered 2017-09-19: 8 mg via INTRAVENOUS
  Filled 2017-09-19: qty 4

## 2017-09-19 MED ORDER — DEXAMETHASONE SODIUM PHOSPHATE 10 MG/ML IJ SOLN
10.0000 mg | Freq: Once | INTRAMUSCULAR | Status: AC
  Administered 2017-09-19: 10 mg via INTRAVENOUS
  Filled 2017-09-19: qty 1

## 2017-09-19 MED ORDER — SODIUM CHLORIDE 0.9 % IV SOLN: Freq: Once | INTRAVENOUS | Status: DC

## 2017-09-19 MED ORDER — HYDROCOD POLST-CPM POLST ER 10-8 MG/5ML PO SUER: 5.0000 mL | Freq: Two times a day (BID) | ORAL | 0 refills | Status: DC

## 2017-09-19 MED ORDER — HEPARIN SOD (PORK) LOCK FLUSH 100 UNIT/ML IV SOLN
500.0000 [IU] | Freq: Once | INTRAVENOUS | Status: AC
  Administered 2017-09-19: 500 [IU] via INTRAVENOUS

## 2017-09-19 NOTE — Telephone Encounter (Signed)
Husband called back at 8- pt would like the apt with NP today

## 2017-09-19 NOTE — Telephone Encounter (Signed)
Received msg from Dr. Janese Banks. Patient's husband contacted on call doctor this weekend. Pt c/o fatigue, weakness, no fevers at that time of this phone call. I contacted patient to offer an apt with [facility] NP. Husband reports that after the phone call with Dr. Janese Banks, pt did have a fever of 100.5. He gave her ibuprofen. Temp normalized. Patient reports feeling much better today. She was able to eat last evening. Apt offered with [facility] clinic today at 1:30Pm and 1:15 for labs. However, husband states that pt was declining apt due to feeling better. He disagrees and would like to leave the apt up to the patient. He will call our office back to determine what the patient wants to do and let us know. Pt was contemplating on keeping the apt on 9/18 with Dr. B instead.

## 2017-09-19 NOTE — Progress Notes (Signed)
Symptom Management Consult note [facility]  Telephone:([phone] [phone]  Patient Care Team: Rubye Beach as PCP - General (Family Medicine) Allyne Gee, MD as Referring Physician (Internal Medicine)   Name of the patient: Deanna Blair  [id number]  03-31-1955   Date of visit:09/19/17  Diagnosis-metastatic adenocarcinoma of the lung  Chief complaint/ Reason for visit- Recurrent fevers/cough  Heme/Onc history: Patient was seen in [facility] on 09/14/2017 for repeat labs, IV fluids and reassessment.  She continued to complain of chills, nasal congestion and nonproductive cough.  She was sent for a stat chest x-ray.  Labs improved.   She had recently been evaluated in [facility] on 09/12/2017 for weakness and fever x2 days with intermittent hallucinations.  She had completed cycle 1 day 8 Gemzar few days prior.  She was found to be significantly neutropenic with an ANC of 0.7 and dehydrated.  She was given IV fluids, IV Decadron and Zofran.  She was started on 500 mg Levaquin p.o. daily for 10 days for fever of unknown origin and instructed to take Singulair for the next week.  She was given 1 dose of Granix subcutaneously.  UA and urine culture were negative.  Blood cultures were negative to date.   Oncology History   # FEB 2017-ADENO CA Right Lower Lung T4N2M0- STAGE IIIB [Bronch & Subcarinal LNBx]- March 8th START CARBO-ALIMTA x4 cycles- June 8th 2017 - IMPROVED FDG MULTI-FOCAL lesions/N2-LN activity. S/p [place]-- Alimta x6  # AUG 18th- Alimta- Avastin maintenance; SEP 13th DISCONTINUE AVASTIN [sec to cavitary lesion]; OCT 20th 2017- CT-STABLE Disease; NOV 8th CT/PET [facility]]- "overall slight progression-? 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 The main right frontal lesion gotten smaller; improve edema.  # Jan 2019- Progression [CT chest; [facility]]; Brain MRI- new sub cm lesions- monitored  # feb 14th 2019- Tax-Cyram   --------------------------------------------------------------     MOLECULAR STUDIES:  KRAS MUTATED/Tissues not sufficient for OTHER the molecular marker; will need repeat Bx ; GUARDIANT TESTING [[facility]]- pending. [facility] No targettable mutations.  ------------------------------------------------------   DIAGNOSIS: [ FEB 2017]- [place] CA LUNG  STAGE:   IV      ;GOALS: palliative  CURRENT/MOST RECENT THERAPY- Tax-Ram      Primary cancer of right [place] lobe of [place] ([place]  08/30/2017 -  Chemotherapy    The patient had gemcitabine (GEMZAR) 1,7,001.7494g in sodium chloride 0.9 % 250 mL chemo infusion, 1,482 mg, Intravenous,  Once, 1 of 4 cycles Administration: 1,4,967.5916g (08/31/2017), 1,482 mg (09/07/2017)  for chemotherapy treatment.      Interval history-  WaJazilyn Siegenthalerresents with low grade fevers.  She has had the fever for 1 weeks.   Symptoms have been waxing and waning. Symptoms associated  with the fever include body aches, chills, fatigue, nausea and URI symptoms, and patient denies abdominal pain, diarrhea and URI symptoms. Symptoms are worse at no particular time of day.   Symptoms have been gradually worsening since that time.  Patient has been restless and not sleeping well at  night d/t cough. Appetite has declined. Urine output has been stable.  She has associated symptoms of non productive cough, fatigue and no appetite. She has tried to alleviate the symptoms with acetaminophen with minimal relief.    The patient has cancer, immunocompromised state of recent treatment. Exposure to someone else at home w/similar symptoms:no  Exposure to someone else at daycare/school/work:no. Denies any further neurologic complaints. Denies any easy bleeding or bruising.  Denies chest pain. Denies any constipation, or diarrhea. Denies urinary complaints. Patient offers no further specific complaints today.  ECOG FS:1 - Symptomatic but completely ambulatory  Review of systems- Review of Systems  Constitutional: Positive for chills and fever (99.4 today). Negative for malaise/fatigue and weight loss.  HENT: Negative for congestion, ear pain and tinnitus.   Eyes: Negative.  Negative for blurred vision and double vision.  Respiratory: Positive for cough and sputum production. Negative for shortness of breath.   Cardiovascular: Negative.  Negative for chest pain, palpitations and leg swelling.  Gastrointestinal: Positive for nausea. Negative for abdominal pain, constipation, diarrhea and vomiting.  Genitourinary: Negative for dysuria, frequency and urgency.  Musculoskeletal: Negative for back pain and falls.  Skin: Negative.  Negative for rash.  Neurological: Positive for weakness. Negative for headaches.  Endo/Heme/Allergies: Negative.  Does not bruise/bleed easily.  Psychiatric/Behavioral: Negative.  Negative for depression. The patient is not nervous/anxious and does not have insomnia.      Current treatment- s/p cycle 1 day 8 Gemzar. Last given on 09/07/17.  No Known Allergies   Past Medical History:  Diagnosis Date  . Anxiety   . Cancer of right lung ([place]) 02/13/2015, 01/13/17  . Depression   . Depression with anxiety    Well controlled with Wellbutrin and Buspar  .  Pneumonia      Past Surgical History:  Procedure Laterality Date  . ECTOPIC PREGNANCY SURGERY  1988  . ELECTROMAGNETIC NAVIGATION BROCHOSCOPY N/A 02/10/2015   Procedure: ELECTROMAGNETIC NAVIGATION BRONCHOSCOPY;  Surgeon: Flora Lipps, MD;  Location: [facility] ORS;  Service: Cardiopulmonary;  Laterality: N/A;  . ENDOBRONCHIAL ULTRASOUND N/A 02/10/2015   Procedure: ENDOBRONCHIAL ULTRASOUND;  Surgeon: Flora Lipps, MD;  Location: [facility] ORS;  Service: Cardiopulmonary;  Laterality: N/A;  . PERIPHERAL VASCULAR CATHETERIZATION N/A 03/05/2015   Procedure: Glori Luis Cath Insertion;  Surgeon: Katha Cabal, MD;  Location: [facility];  Service: Cardiovascular;  Laterality: N/A;    Social History   Socioeconomic History  . Marital status: Married    Spouse name: Herbie Baltimore  . Number of children: 1  . Years of education: 85  . Highest education level: Not on file  Occupational History  . 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 Financial resource strain: Not on file  . Food insecurity:    Worry: Not on file    Inability: Not on file  . Transportation needs:    Medical: Not on file    Non-medical: Not on file  Tobacco Use  . Smoking status: Former Smoker    Packs/day: 0.25    Years: 30.00    Pack years: 7.50    Types: Cigarettes    Last attempt to quit:  02/06/2005    Years since quitting: 12.6  . Smokeless tobacco: Never Used  Substance and Sexual Activity  . Alcohol use: Yes    Comment: not drank any in several months  . Drug use: No  . Sexual activity: Yes    Partners: Male  Lifestyle  . Physical activity:    Days per week: Not on file    Minutes per session: Not on file  . Stress: Not on file  Relationships  . Social connections:    Talks on phone: Not on file    Gets together: Not on file    Attends religious service: Not on file    Active member of club or organization: Not on file    Attends meetings of clubs or organizations: Not  on file    Relationship status: Not on file  . Intimate partner violence:    Fear of current or ex partner: Not on file    Emotionally abused: Not on file    Physically abused: Not on file    Forced sexual activity: Not on file  Other Topics Concern  . Not on file  Social History Narrative   [name] was born in [place], [place]. She moved with her family with [facility] and lived there for 13 years. She recently moved to [place] approximately 1 year ago. She lives with her husband of 28 years. They have an adult daughter who lives in [place]. 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 She is enjoying driving around and getting to know [place]. She and her husband enjoy cooking and baking. They also enjoy hiking when the weather permits.    Family History  Problem Relation Age of Onset  . Cancer Father        Prostate Cancer  . Hypertension Father   . Stroke Father   . Hypertension Brother      Current Outpatient Medications:  .  acetaminophen (TYLENOL) 500 MG tablet, Take 1,000 mg by mouth every 4 (four) hours as needed. , Disp: , Rfl:  .  dexamethasone (DECADRON) 4 MG tablet, Take 1 tablet (4 mg total) by mouth 2 (two) times daily with a meal. Start the day prior to chemo; for 3 days., Disp: 60 tablet, Rfl: 3 .  docusate sodium (COLACE) 100 MG capsule, Take 100 mg by mouth 2 (two) times daily., Disp: , Rfl:  .  fentaNYL (DURAGESIC - DOSED MCG/HR) 25 MCG/HR patch, Place 1 patch (25 mcg total) onto the skin every 3 (three) days., Disp: 10 patch, Rfl: 0 .  Fluticasone-Umeclidin-Vilant (TRELEGY ELLIPTA) 100-62.5-25 MCG/INH AEPB, Inhale 1 puff into the lungs daily., Disp: 1 each, Rfl: 4 .  ibuprofen (IBU) 400 MG tablet, Take 1 tablet (400 mg total) by mouth 3 (three) times daily as needed., Disp: 30 tablet, Rfl: 0 .  levofloxacin (LEVAQUIN) 500 MG tablet, Take 1 tablet (500 mg total) by mouth daily., Disp: 10 tablet, Rfl: 0 .  lidocaine-prilocaine (EMLA) cream,  Apply 1 application topically as needed., Disp: 30 g, Rfl: 6 .  LORazepam (ATIVAN) 0.5 MG tablet, Take 1 tablet (0.5 mg total) by mouth 2 (two) times daily. As needed for anxiety/ nausea, Disp: 60 tablet, Rfl: 0 .  magic mouthwash w/lidocaine SOLN, Take 5 mLs by mouth 4 (four) times daily., Disp: 480 mL, Rfl: 3 .  montelukast (SINGULAIR) 10 MG tablet, Take 1 tablet (10 mg total) by mouth at bedtime., Disp: 10 tablet, Rfl: 0 .  ondansetron (ZOFRAN) 8  MG tablet, Take 1 tablet (8 mg total) by mouth every 8 (eight) hours as needed for nausea or vomiting (start 3 days; after chemo)., Disp: 40 tablet, Rfl: 3 .  Oxycodone HCl 10 MG TABS, Take 1 tablet (10 mg total) by mouth every 6 (six) hours as needed (moderate to severe pain)., Disp: 60 tablet, Rfl: 0 .  polyethylene glycol (MIRALAX / GLYCOLAX) packet, Take 17 g by mouth daily as needed., Disp: , Rfl:  .  potassium chloride SA (K-DUR,KLOR-CON) 20 MEQ tablet, Take 1 tablet (20 mEq total) by mouth 2 (two) times daily., Disp: 10 tablet, Rfl: 0 .  prochlorperazine (COMPAZINE) 10 MG tablet, Take 1 tablet (10 mg total) by mouth every 6 (six) hours as needed for nausea or vomiting., Disp: 30 tablet, Rfl: 3 .  TRELEGY ELLIPTA 100-62.5-25 MCG/INH AEPB, INHALE 1 PUFF ONCE DAILY, Disp: 60 each, Rfl: 4 .  zolpidem (AMBIEN) 5 MG tablet, TAKE 1 TABLET BY MOUTH AT BEDTIME AS NEEDED FOR SLEEP, Disp: 30 tablet, Rfl: 2 .  chlorpheniramine-HYDROcodone (TUSSIONEX) 10-8 MG/5ML SUER, Take 5 mLs by mouth 2 (two) times daily., Disp: 140 mL, Rfl: 0 No current facility-administered medications for this visit.   Facility-Administered Medications Ordered in Other Visits:  .  0.9 %  sodium chloride infusion, , Intravenous, Continuous, Burns, Jennifer E, NP .  dexamethasone (DECADRON) injection 10 mg, 10 mg, Intravenous, Once, Jacquelin Hawking, NP  Physical exam:  Vitals:   09/19/17 1344  BP: 101/64  Pulse: 99  Resp: 16  Temp: 99.6 F (37.6 C)  TempSrc: Tympanic  SpO2:  93%   Physical Exam  Constitutional: She is oriented to person, place, and time. Vital signs are normal. She appears cachectic.  HENT:  Head: Normocephalic and atraumatic.  Eyes: Pupils are equal, round, and reactive to light.  Neck: Normal range of motion.  Cardiovascular: Normal rate, regular rhythm and normal heart sounds.  No murmur heard. Pulmonary/Chest: Effort normal. She has decreased breath sounds. She has wheezes in the right upper field and the right middle field.  Abdominal: Soft. Normal appearance and bowel sounds are normal. She exhibits no distension. There is no tenderness.  Musculoskeletal: Normal range of motion. She exhibits no edema.  Neurological: She is alert and oriented to person, place, and time.  Skin: Skin is warm and dry. No rash noted.  Psychiatric: Judgment normal.     CMP Latest Ref Rng & Units 09/19/2017  Glucose 70 - 99 mg/dL 117(H)  BUN 8 - 23 mg/dL 15  Creatinine 0.44 - 1.00 mg/dL 0.81  Sodium 135 - 145 mmol/L 136  Potassium 3.5 - 5.1 mmol/L 3.9  Chloride 98 - 111 mmol/L 103  CO2 22 - 32 mmol/L 21(L)  Calcium 8.9 - 10.3 mg/dL 9.2  Total Protein 6.5 - 8.1 g/dL 6.7  Total Bilirubin 0.3 - 1.2 mg/dL 0.6  Alkaline Phos 38 - 126 U/L 64  AST 15 - 41 U/L 23  ALT 0 - 44 U/L 11   CBC Latest Ref Rng & Units 09/19/2017  WBC 3.6 - 11.0 K/uL 9.2  Hemoglobin 12.0 - 16.0 g/dL 11.0(L)  Hematocrit 35.0 - 47.0 % 32.6(L)  Platelets 150 - 440 K/uL 251    No images are attached to the encounter.  Dg Chest 2 View  Result Date: 09/15/2017 CLINICAL DATA:  Cough for 2 days, history of lung carcinoma, recent fever EXAM: CHEST - 2 VIEW COMPARISON:  CT abdomen pelvis from outside institution dated 01/13/2016, and chest x-ray of  03/31/2015 FINDINGS: There is more parenchymal opacity in the right mid lung and right lung base. Patient has a known malignancy at the right lung base and some of this opacity could well be due to radiation fibrosis. However superimposed  pneumonia is a definite consideration and follow-up chest x-ray is recommended. No definite pleural effusion is seen. The left lung appears clear. Right-sided Port-A-Cath tip is present overlying the lower SVC and the heart is mildly enlarged and stable. No acute bony abnormality is seen. It would be helpful to have the dictation from the CT abdomen pelvis from [facility] dated 01/12/2017, as well as any chest x-rays and dictated reports. IMPRESSION: 1. Parenchymal opacity within the right mid lung and right lung base possibly due to radiation fibrosis, but superimposed pneumonia cannot be excluded. Correlation with any more recent chest x-ray possibly from [facility] would be helpful. 2. The left lung appears clear. 3. Port-A-Cath tip overlies the lower SVC. Electronically Signed   By: Ivar Drape M.D.   On: 09/15/2017 11:11     Assessment and plan- Patient is a 62 y.o. female who presents for follow-up of fevers, weakness and dehydration.  1.  Lung cancer: s/p cycle 1 day 8 gemcitabine q. 21 days.  Last given on 09/07/2017.  Tolerated first treatment well with only mild myalgia.  Seen in [facility] on 09/12/2017 and 09/14/2017 for fever of unknown origin, neutropenia and dehydration.  She was given fluids, antiemetics and steroids. Given Granix 480 mcg on 09/12/2017 for ANC 700.  Scheduled to return to clinic on 09/21/2017 to see Dr. Rogue Bussing for assessment prior to cycle 2 of gemcitabine. This will likely be postponed d/t a scheduled vacation and poor tolerance to last cycle.   2.  Nonproductive cough: Had stat chest x-ray last week which revealed parenchymal opacity in the right mid and right lung base possibly due to radiation fibrosis but superimposed pneumonia cannot be excluded.  After initial visit on 09/12/2017 she was started on 500 mg Levaquin daily for 10 days for fever of unknown orgin. Worsening cough and is asking for something to help her sleep. RX Tussinex 5 ml at bedtime to help with cough and sleep.     3.  Fever: Low-grade fever today.  Fever of 102.4 over the weekend.  Infection work-up to date includes negative blood cultures, negative urinalysis and urine culture and inconclusive chest x-ray.  Again, gemcitabine alone can cause fevers.  Given patient is becoming weaker and more symptomatic, will get CT chest with contrast tomorrow. Has not has CT scan since April. Scheduled to be seen at [facility] in the next several weeks for scans. Discussed case with Dr. Rogue Bussing.   4.  Dehydration: Will give 1 liter NaCl, steroids and antiemetics today. She was extremely orthostatic. Unable to obtain standing Bp prior to fluids. Improved with fluids.  Instructed patient to change positions slowly and to stay as hydrated as possible.  Spoke to dietitian who recommended supplementing as possible.  Several samples provided for patient and husband including Ensure clears, adult Pedialyte and Ensure packets.  5. Nausea: Continue antiemetics as prescribed. Given 8 mg zofran and 10 me decadron IV. Symptoms improved.    Visit Diagnosis 1. Cough   2. Fever, unspecified fever cause   3. Nausea without vomiting   4. Dehydration     Patient expressed understanding and was in agreement with this plan. She also understands that She can call clinic at any time with any questions, concerns, or  complaints.   Greater than 50% was spent in counseling and coordination of care with this patient including but not limited to discussion of the relevant topics above (See A&P) including, but not limited to diagnosis and management of acute and chronic medical conditions.    Faythe Casa, AGNP-C [facility] Cell - [phone] Pager- [phone] 09/20/2017 9:41 AM

## 2017-09-19 NOTE — Addendum Note (Signed)
Addended by: Renita Papa R on: 09/19/2017 12:08 PM   Modules accepted: Orders

## 2017-09-20 ENCOUNTER — Ambulatory Visit
Admission: RE | Admit: 2017-09-20 | Discharge: 2017-09-20 | Disposition: A | Discharge status: Home / Self Care | Payer: BLUE CROSS/BLUE SHIELD | Source: Ambulatory Visit | Attending: Oncology | Admitting: Oncology

## 2017-09-20 DIAGNOSIS — J439 Emphysema, unspecified: Secondary | ICD-10-CM | POA: Diagnosis not present

## 2017-09-20 DIAGNOSIS — R05 Cough: Secondary | ICD-10-CM | POA: Diagnosis not present

## 2017-09-20 DIAGNOSIS — I700 Atherosclerosis of aorta: Secondary | ICD-10-CM | POA: Diagnosis not present

## 2017-09-20 DIAGNOSIS — R918 Other nonspecific abnormal finding of lung field: Secondary | ICD-10-CM | POA: Insufficient documentation

## 2017-09-20 DIAGNOSIS — R059 Cough, unspecified: Secondary | ICD-10-CM

## 2017-09-20 DIAGNOSIS — R509 Fever, unspecified: Secondary | ICD-10-CM | POA: Insufficient documentation

## 2017-09-20 MED ORDER — IOHEXOL 300 MG/ML  SOLN
65.0000 mL | Freq: Once | INTRAMUSCULAR | Status: AC | PRN
  Administered 2017-09-20: 65 mL via INTRAVENOUS

## 2017-09-21 ENCOUNTER — Inpatient Hospital Stay (HOSPITAL_BASED_OUTPATIENT_CLINIC_OR_DEPARTMENT_OTHER): Payer: BLUE CROSS/BLUE SHIELD | Admitting: Internal Medicine

## 2017-09-21 ENCOUNTER — Inpatient Hospital Stay: Payer: BLUE CROSS/BLUE SHIELD

## 2017-09-21 VITALS — BP 106/68 | HR 68 | Temp 96.7°F | Resp 16 | Wt 108.0 lb

## 2017-09-21 DIAGNOSIS — G893 Neoplasm related pain (acute) (chronic): Secondary | ICD-10-CM

## 2017-09-21 DIAGNOSIS — C3431 Malignant neoplasm of lower lobe, right bronchus or lung: Secondary | ICD-10-CM | POA: Diagnosis not present

## 2017-09-21 DIAGNOSIS — G620 Drug-induced polyneuropathy: Secondary | ICD-10-CM | POA: Diagnosis not present

## 2017-09-21 DIAGNOSIS — R110 Nausea: Secondary | ICD-10-CM

## 2017-09-21 DIAGNOSIS — C7931 Secondary malignant neoplasm of brain: Secondary | ICD-10-CM

## 2017-09-21 MED ORDER — HYDROCOD POLST-CPM POLST ER 10-8 MG/5ML PO SUER: 5.0000 mL | Freq: Two times a day (BID) | ORAL | 0 refills | Status: DC

## 2017-09-21 MED ORDER — OXYCODONE HCL 10 MG PO TABS: 10.0000 mg | ORAL_TABLET | Freq: Four times a day (QID) | ORAL | 0 refills | Status: DC | PRN

## 2017-09-21 MED ORDER — HEPARIN SOD (PORK) LOCK FLUSH 100 UNIT/ML IV SOLN: 500.0000 [IU] | Freq: Once | INTRAVENOUS | Status: AC

## 2017-09-21 NOTE — Progress Notes (Signed)
[place] OFFICE PROGRESS NOTE  Patient Care Team: Deanna Blair as PCP - General (Family Medicine) Deanna Gee, MD as Referring Physician (Internal Medicine)  Cancer Staging No matching staging information was found for the patient.   Oncology History   # FEB 2017-ADENO CA Right Lower Lung T4N2M0- STAGE IIIB [Bronch & Subcarinal LNBx]- March 8th START CARBO-ALIMTA x4 cycles- June 8th 2017 - IMPROVED FDG MULTI-FOCAL lesions/N2-LN activity. S/p [place]-- Alimta x6  # AUG 18th- Alimta- Avastin maintenance; SEP 13th DISCONTINUE AVASTIN [sec to cavitary lesion]; OCT 20th 2017- CT-STABLE Disease; NOV 8th CT/PET [facility]]- "overall slight progression-? Lymphangiectatic spread"  # Jan - April 2018Clement Blair; progression  # April 25th 2018- Carbo- Taxol x3 ccyles; June 2018- CT chest STABLE Disease;July 25th- Add Avastin to [place] taxol x6 cycles- Sep 2018-CT scan- [facility] STABLE   # Sep 2018- Avastin Maintence;   JAN 9th CT- right lung progression [s/p Bx- [place]; [place] [facility]; F-one- 1/15]  # NOV 2nd 2017-MRI, [facility]- Brain mets [asymptomatic;Right frontal ~73m; ~356mlesions -appx 3-4 lesions; (right Frontal & Left parietal s/p GK x 2 lesions] ; June 2018- Progession vs radiation necrosis [July 2th-Add Avastin]  # [facility] 4 small subcentimeter brain lesions for which she had the Gamma knife. The main right frontal lesion gotten smaller; improve edema.  # Jan 2019- Progression [CT chest; [facility]]; Brain MRI- new sub cm lesions- monitored  # feb 14th 2019- Tax-Cyram  # AUG 28th 2019- Gem   --------------------------------------------------------------     MOLECULAR STUDIES:  KRAS MUTATED/Tissues not sufficient for OTHER the molecular marker; will need repeat Bx ; GUARDIANT TESTING [[facility]]- pending. # [phone] No targettable mutations.   ------------------------------------------------------   DIAGNOSIS: [ FEB 2017]- ADENO CA LUNG  STAGE:   IV  ;GOALS: palliative  CURRENT/MOST RECENT THERAPY- Gem      Primary cancer of right lower lobe of lung ([place]     INTERVAL HISTORY:  [name]89.o.  female pleasant patient above history of multifocal adenocarcinoma the lung-currently gemcitabine is here for follow-up.  Patient continues to feel poorly.  Denies any worsening shortness of breath.  Any continued tingling and numbness.  Positive for cough.  No fevers or chills.  Back pain.  Tearful.   Review of Systems  Constitutional: Positive for malaise/fatigue. Negative for chills, diaphoresis, fever and weight loss.  HENT: Negative for nosebleeds and sore throat.   Eyes: Negative for double vision.  Respiratory: Negative for cough, hemoptysis, sputum production, shortness of breath and wheezing.   Cardiovascular: Negative for chest pain, palpitations, orthopnea and leg swelling.  Gastrointestinal: Negative for abdominal pain, blood in stool, constipation, diarrhea, heartburn, melena, nausea and vomiting.  Genitourinary: Negative for dysuria, frequency and urgency.  Musculoskeletal: Positive for back pain, joint pain and myalgias.  Skin: Negative.  Negative for itching and rash.  Neurological: Positive for tingling. Negative for dizziness, focal weakness, weakness and headaches.  Endo/Heme/Allergies: Does not bruise/bleed easily.  Psychiatric/Behavioral: Negative for depression. The patient is nervous/anxious. The patient does not have insomnia.       PAST MEDICAL HISTORY :  Past Medical History:  Diagnosis Date  . Anxiety   . Cancer of right lung ([facility]/09/[place], 01/13/17  . Depression   . Depression with anxiety    Well controlled with Wellbutrin and Buspar  . Pneumonia     PAST SURGICAL  HISTORY :   Past Surgical History:  Procedure Laterality Date  . ECTOPIC PREGNANCY SURGERY  1988  .  ELECTROMAGNETIC NAVIGATION BROCHOSCOPY N/A 02/10/2015   Procedure: ELECTROMAGNETIC NAVIGATION BRONCHOSCOPY;  Surgeon: Flora Lipps, MD;  Location: [facility] ORS;  Service: Cardiopulmonary;  Laterality: N/A;  . ENDOBRONCHIAL ULTRASOUND N/A 02/10/2015   Procedure: ENDOBRONCHIAL ULTRASOUND;  Surgeon: Flora Lipps, MD;  Location: [facility] ORS;  Service: Cardiopulmonary;  Laterality: N/A;  . PERIPHERAL VASCULAR CATHETERIZATION N/A 03/05/2015   Procedure: Glori Luis Cath Insertion;  Surgeon: Katha Cabal, MD;  Location: [facility];  Service: Cardiovascular;  Laterality: N/A;    FAMILY HISTORY :   Family History  Problem Relation Age of Onset  . Cancer Father        Prostate Cancer  . Hypertension Father   . Stroke Father   . Hypertension Brother     SOCIAL HISTORY:   Social History   Tobacco Use  . Smoking status: Former Smoker    Packs/day: 0.25    Years: 30.00    Pack years: 7.50    Types: Cigarettes    Last attempt to quit: 02/06/2005    Years since quitting: 12.6  . Smokeless tobacco: Never Used  Substance Use Topics  . Alcohol use: Yes    Comment: not drank any in several months  . Drug use: No    ALLERGIES:  has No Known Allergies.  MEDICATIONS:  Current Outpatient Medications  Medication Sig Dispense Refill  . acetaminophen (TYLENOL) 500 MG tablet Take 1,000 mg by mouth every 4 (four) hours as needed.     . chlorpheniramine-HYDROcodone (TUSSIONEX) 10-8 MG/5ML SUER Take 5 mLs by mouth 2 (two) times daily. 250 mL 0  . dexamethasone (DECADRON) 4 MG tablet Take 1 tablet (4 mg total) by mouth 2 (two) times daily with a meal. Start the day prior to chemo; for 3 days. 60 tablet 3  . docusate sodium (COLACE) 100 MG capsule Take 100 mg by mouth 2 (two) times daily.    . fentaNYL (DURAGESIC - DOSED MCG/HR) 25 MCG/HR patch Place 1 patch (25 mcg total) onto the skin every 3 (three) days. 10 patch 0  . Fluticasone-Umeclidin-Vilant (TRELEGY ELLIPTA) 100-62.5-25 MCG/INH AEPB Inhale 1 puff into  the lungs daily. 1 each 4  . ibuprofen (IBU) 400 MG tablet Take 1 tablet (400 mg total) by mouth 3 (three) times daily as needed. 30 tablet 0  . levofloxacin (LEVAQUIN) 500 MG tablet Take 1 tablet (500 mg total) by mouth daily. 10 tablet 0  . lidocaine-prilocaine (EMLA) cream Apply 1 application topically as needed. 30 g 6  . LORazepam (ATIVAN) 0.5 MG tablet Take 1 tablet (0.5 mg total) by mouth 2 (two) times daily. As needed for anxiety/ nausea 60 tablet 0  . magic mouthwash w/lidocaine SOLN Take 5 mLs by mouth 4 (four) times daily. 480 mL 3  . montelukast (SINGULAIR) 10 MG tablet Take 1 tablet (10 mg total) by mouth at bedtime. 10 tablet 0  . ondansetron (ZOFRAN) 8 MG tablet Take 1 tablet (8 mg total) by mouth every 8 (eight) hours as needed for nausea or vomiting (start 3 days; after chemo). 40 tablet 3  . Oxycodone HCl 10 MG TABS Take 1 tablet (10 mg total) by mouth every 6 (six) hours as needed (moderate to severe pain). 120 tablet 0  . polyethylene glycol (MIRALAX / GLYCOLAX) packet Take 17 g by mouth daily as needed.    . potassium chloride SA (K-DUR,KLOR-CON) 20 MEQ  tablet Take 1 tablet (20 mEq total) by mouth 2 (two) times daily. 10 tablet 0  . prochlorperazine (COMPAZINE) 10 MG tablet Take 1 tablet (10 mg total) by mouth every 6 (six) hours as needed for nausea or vomiting. 30 tablet 3  . TRELEGY ELLIPTA 100-62.5-25 MCG/INH AEPB INHALE 1 PUFF ONCE DAILY 60 each 4  . zolpidem (AMBIEN) 5 MG tablet TAKE 1 TABLET BY MOUTH AT BEDTIME AS NEEDED FOR SLEEP 30 tablet 2   No current facility-administered medications for this visit.    Facility-Administered Medications Ordered in Other Visits  Medication Dose Route Frequency Provider Last Rate Last Dose  . 0.9 %  sodium chloride infusion   Intravenous Continuous Jacquelin Hawking, NP      . dexamethasone (DECADRON) injection 10 mg  10 mg Intravenous Once Faythe Casa E, NP      . heparin lock flush 100 unit/mL  500 Units Intravenous Once  Cammie Sickle, MD        PHYSICAL EXAMINATION: ECOG PERFORMANCE STATUS: 1 - Symptomatic but completely ambulatory  BP 106/68 (BP Location: Left Arm, Patient Position: Sitting)   Pulse 68   Temp (!) 96.7 F (35.9 C) (Tympanic)   Resp 16   Wt 108 lb (49 kg)   LMP 06/16/2000 (Approximate) Comment: 15 yrs ago  BMI 19.13 kg/m   Filed Weights   09/21/17 0946  Weight: 108 lb (49 kg)    Physical Exam  Constitutional: She is oriented to person, place, and time and well-developed, well-nourished, and in no distress.  HENT:  Head: Normocephalic and atraumatic.  Mouth/Throat: Oropharynx is clear and moist. No oropharyngeal exudate.  Eyes: Pupils are equal, round, and reactive to light.  Neck: Normal range of motion. Neck supple.  Cardiovascular: Normal rate and regular rhythm.  Pulmonary/Chest: No respiratory distress. She has no wheezes.  Right LL decreased breath sounds compared to left.   Abdominal: Soft. Bowel sounds are normal. She exhibits no distension and no mass. There is no tenderness. There is no rebound and no guarding.  Musculoskeletal: Normal range of motion. She exhibits no edema or tenderness.  Neurological: She is alert and oriented to person, place, and time.  Skin: Skin is warm.  Psychiatric: Affect normal.  Tearful anxious.       LABORATORY DATA:  I have reviewed the data as listed    Component Value Date/Time   NA 136 09/19/2017 1330   NA 139 01/03/2015 0910   K 3.9 09/19/2017 1330   CL 103 09/19/2017 1330   CO2 21 (L) 09/19/2017 1330   GLUCOSE 117 (H) 09/19/2017 1330   BUN 15 09/19/2017 1330   BUN 10 01/03/2015 0910   CREATININE 0.81 09/19/2017 1330   CALCIUM 9.2 09/19/2017 1330   PROT 6.7 09/19/2017 1330   PROT 6.5 01/03/2015 0910   ALBUMIN 3.3 (L) 09/19/2017 1330   ALBUMIN 3.8 01/03/2015 0910   AST 23 09/19/2017 1330   ALT 11 09/19/2017 1330   ALKPHOS 64 09/19/2017 1330   BILITOT 0.6 09/19/2017 1330   BILITOT <0.2 01/03/2015 0910    GFRNONAA >60 09/19/2017 1330   GFRAA >60 09/19/2017 1330    No results found for: SPEP, UPEP  Lab Results  Component Value Date   WBC 9.2 09/19/2017   NEUTROABS 6.7 (H) 09/19/2017   HGB 11.0 (L) 09/19/2017   HCT 32.6 (L) 09/19/2017   MCV 100.8 (H) 09/19/2017   PLT 251 09/19/2017      Chemistry  Component Value Date/Time   NA 136 09/19/2017 1330   NA 139 01/03/2015 0910   K 3.9 09/19/2017 1330   CL 103 09/19/2017 1330   CO2 21 (L) 09/19/2017 1330   BUN 15 09/19/2017 1330   BUN 10 01/03/2015 0910   CREATININE 0.81 09/19/2017 1330      Component Value Date/Time   CALCIUM 9.2 09/19/2017 1330   ALKPHOS 64 09/19/2017 1330   AST 23 09/19/2017 1330   ALT 11 09/19/2017 1330   BILITOT 0.6 09/19/2017 1330   BILITOT <0.2 01/03/2015 0910       RADIOGRAPHIC STUDIES: I have personally reviewed the radiological images as listed and agreed with the findings in the report. No results found.   ASSESSMENT & PLAN:  Primary cancer of right lower lobe of lung ([place]) # Metastatic adenocarcinoma the lung- on SEP 16th CT scan Progression of  increased in multi-focal adeno ca. Currently on gemcitabine. STABLE.   #Given poor tolerance to therapy/patient's preference recommend holding off therapy today.  Patient awaiting repeat evaluation at [facility] early October.  Await further recommendations from [facility].  #Brain mets status post [facility]; right parietal 4-5 mm lesion slightly bigger/stable overall  #Peripheral neuropathy grade 2; Continue gabapentin.  Stable  # Intermittent nausea vomiting-improved; continue Ativan 0.5 mg twice a day as needed.  Stable  #Follow-up MD/ 14th October- cbc/cmp; possible gem/copy of CT scan from radiology CT  Cc: Dr>leventakos.    No orders of the defined types were placed in this encounter.  All questions were answered. The patient knows to call the clinic with any problems, questions or concerns.      Cammie Sickle, MD 09/28/2017 5:57 PM

## 2017-09-21 NOTE — Assessment & Plan Note (Addendum)
#   Metastatic adenocarcinoma the lung- on SEP 16th CT scan Progression of  increased in multi-focal adeno ca. Currently on gemcitabine. STABLE.   #Given poor tolerance to therapy/patient's preference recommend holding off therapy today.  Patient awaiting repeat evaluation at [facility] early October.  Await further recommendations from [facility].  #Brain mets status post [facility]; right parietal 4-5 mm lesion slightly bigger/stable overall  #Peripheral neuropathy grade 2; Continue gabapentin.  Stable  # Intermittent nausea vomiting-improved; continue Ativan 0.5 mg twice a day as needed.  Stable  #Follow-up MD/ 14th October- cbc/cmp; possible gem/copy of CT scan from radiology CT  Cc: Dr>leventakos.

## 2017-09-28 ENCOUNTER — Telehealth: Payer: Self-pay | Admitting: *Deleted

## 2017-09-28 NOTE — Telephone Encounter (Signed)
Husband called 1220 in emotional distress. Husband crying on phone. Patient was found unresponsive this morning. They are currently in [place] on vacation with family. Husband states that "Jhene seemed a little more fatigue last evening. She ate dinner with her family and honestly had fun visiting with her friends. She kept sleeping in this am and I let her. I thought it was very strange that she didn't wake up by 11'sh. I went into check on her not long ago and realized she was unresponsive/eyes dilated. She would not respond to physical stimlui" per husband. "I immediately called 911. They are taking her to a small hospital nearby. [facility] are no [facility] near and this one honestly is so small. I'm hoping she is just dehydrated but I am very worried. I would like Dr. B to talk to the ER docs once the EMS gets her settled for a hand off of care."  Reassurance provided that I would give the msg to Dr. Rogue Bussing. He thanked me for taking his call.

## 2017-09-29 NOTE — Telephone Encounter (Signed)
Left msg for Deanna Blair- to check on patient.

## 2017-09-30 ENCOUNTER — Telehealth: Payer: Self-pay | Admitting: Internal Medicine

## 2017-09-30 NOTE — Telephone Encounter (Signed)
Discussed with Dr. Gordy Levan pulmonary critical care provider [Ph: [phone] regarding patient's clinical status.  Patient currently on BiPAP ICU.  CT scan showed progressive " pneumonia changes"; clinically suspect progressive lung cancer.  I recommend palliative care evaluation.   I left a message for husband also.

## 2017-10-17 ENCOUNTER — Inpatient Hospital Stay: Payer: BLUE CROSS/BLUE SHIELD | Attending: Internal Medicine

## 2017-10-17 ENCOUNTER — Inpatient Hospital Stay: Payer: BLUE CROSS/BLUE SHIELD

## 2017-10-17 ENCOUNTER — Inpatient Hospital Stay (HOSPITAL_BASED_OUTPATIENT_CLINIC_OR_DEPARTMENT_OTHER): Payer: BLUE CROSS/BLUE SHIELD | Admitting: Internal Medicine

## 2017-10-17 ENCOUNTER — Encounter: Payer: Self-pay | Admitting: Internal Medicine

## 2017-10-17 VITALS — BP 105/66 | HR 76 | Temp 97.8°F | Resp 16 | Wt 101.4 lb

## 2017-10-17 DIAGNOSIS — Z87891 Personal history of nicotine dependence: Secondary | ICD-10-CM | POA: Diagnosis not present

## 2017-10-17 DIAGNOSIS — D649 Anemia, unspecified: Secondary | ICD-10-CM | POA: Diagnosis not present

## 2017-10-17 DIAGNOSIS — E860 Dehydration: Secondary | ICD-10-CM | POA: Diagnosis not present

## 2017-10-17 DIAGNOSIS — Z79899 Other long term (current) drug therapy: Secondary | ICD-10-CM | POA: Insufficient documentation

## 2017-10-17 DIAGNOSIS — C3431 Malignant neoplasm of lower lobe, right bronchus or lung: Secondary | ICD-10-CM

## 2017-10-17 DIAGNOSIS — C7931 Secondary malignant neoplasm of brain: Secondary | ICD-10-CM | POA: Insufficient documentation

## 2017-10-17 DIAGNOSIS — E871 Hypo-osmolality and hyponatremia: Secondary | ICD-10-CM | POA: Insufficient documentation

## 2017-10-17 DIAGNOSIS — R530 Neoplastic (malignant) related fatigue: Secondary | ICD-10-CM | POA: Diagnosis not present

## 2017-10-17 DIAGNOSIS — M791 Myalgia, unspecified site: Secondary | ICD-10-CM

## 2017-10-17 DIAGNOSIS — D72829 Elevated white blood cell count, unspecified: Secondary | ICD-10-CM | POA: Insufficient documentation

## 2017-10-17 DIAGNOSIS — Z9981 Dependence on supplemental oxygen: Secondary | ICD-10-CM | POA: Diagnosis not present

## 2017-10-17 DIAGNOSIS — J9601 Acute respiratory failure with hypoxia: Secondary | ICD-10-CM | POA: Diagnosis not present

## 2017-10-17 DIAGNOSIS — R000 Tachycardia, unspecified: Secondary | ICD-10-CM | POA: Insufficient documentation

## 2017-10-17 DIAGNOSIS — R05 Cough: Secondary | ICD-10-CM | POA: Diagnosis not present

## 2017-10-17 DIAGNOSIS — G629 Polyneuropathy, unspecified: Secondary | ICD-10-CM

## 2017-10-17 DIAGNOSIS — Z95828 Presence of other vascular implants and grafts: Secondary | ICD-10-CM

## 2017-10-17 LAB — URINALYSIS, COMPLETE (UACMP) WITH MICROSCOPIC
BACTERIA UA: NONE SEEN
Glucose, UA: NEGATIVE mg/dL
Hgb urine dipstick: NEGATIVE
KETONES UR: 5 mg/dL — AB
LEUKOCYTES UA: NEGATIVE
Nitrite: NEGATIVE
PROTEIN: 30 mg/dL — AB
Specific Gravity, Urine: 1.025 (ref 1.005–1.030)
pH: 6 (ref 5.0–8.0)

## 2017-10-17 LAB — CBC WITH DIFFERENTIAL/PLATELET
Abs Immature Granulocytes: 0.15 10*3/uL — ABNORMAL HIGH (ref 0.00–0.07)
Basophils Absolute: 0 10*3/uL (ref 0.0–0.1)
Basophils Relative: 0 %
Eosinophils Absolute: 0.2 10*3/uL (ref 0.0–0.5)
Eosinophils Relative: 1 %
HEMATOCRIT: 30.9 % — AB (ref 36.0–46.0)
Hemoglobin: 10 g/dL — ABNORMAL LOW (ref 12.0–15.0)
Immature Granulocytes: 1 %
LYMPHS ABS: 1.2 10*3/uL (ref 0.7–4.0)
Lymphocytes Relative: 6 %
MCH: 33.3 pg (ref 26.0–34.0)
MCHC: 32.4 g/dL (ref 30.0–36.0)
MCV: 103 fL — AB (ref 80.0–100.0)
MONOS PCT: 5 %
Monocytes Absolute: 1 10*3/uL (ref 0.1–1.0)
NEUTROS PCT: 87 %
Neutro Abs: 19.5 10*3/uL — ABNORMAL HIGH (ref 1.7–7.7)
Platelets: 373 10*3/uL (ref 150–400)
RBC: 3 MIL/uL — ABNORMAL LOW (ref 3.87–5.11)
RDW: 19.9 % — AB (ref 11.5–15.5)
WBC: 22.1 10*3/uL — ABNORMAL HIGH (ref 4.0–10.5)
nRBC: 0 % (ref 0.0–0.2)

## 2017-10-17 LAB — COMPREHENSIVE METABOLIC PANEL
ALT: 25 U/L (ref 0–44)
AST: 25 U/L (ref 15–41)
Albumin: 3.4 g/dL — ABNORMAL LOW (ref 3.5–5.0)
Alkaline Phosphatase: 54 U/L (ref 38–126)
Anion gap: 10 (ref 5–15)
BILIRUBIN TOTAL: 0.7 mg/dL (ref 0.3–1.2)
BUN: 16 mg/dL (ref 8–23)
CALCIUM: 9.8 mg/dL (ref 8.9–10.3)
CO2: 29 mmol/L (ref 22–32)
CREATININE: 0.43 mg/dL — AB (ref 0.44–1.00)
Chloride: 96 mmol/L — ABNORMAL LOW (ref 98–111)
GLUCOSE: 102 mg/dL — AB (ref 70–99)
Potassium: 3.9 mmol/L (ref 3.5–5.1)
Sodium: 135 mmol/L (ref 135–145)
Total Protein: 6.7 g/dL (ref 6.5–8.1)

## 2017-10-17 MED ORDER — HEPARIN SOD (PORK) LOCK FLUSH 100 UNIT/ML IV SOLN
INTRAVENOUS | Status: AC
  Filled 2017-10-17: qty 5

## 2017-10-17 MED ORDER — SODIUM CHLORIDE 0.9% FLUSH
10.0000 mL | Freq: Once | INTRAVENOUS | Status: AC
  Administered 2017-10-17: 10 mL via INTRAVENOUS
  Filled 2017-10-17: qty 10

## 2017-10-17 MED ORDER — HEPARIN SOD (PORK) LOCK FLUSH 100 UNIT/ML IV SOLN
500.0000 [IU] | Freq: Once | INTRAVENOUS | Status: AC
  Administered 2017-10-17: 500 [IU]

## 2017-10-17 NOTE — Assessment & Plan Note (Addendum)
#   Metastatic adenocarcinoma the lung- on SEP 16th CT scan Progression of  increased in multi-focal adeno ca.  Most recently on gemcitabine; currently on hold given the recent acute respiratory failure/pneumonia [[place]]; see discussion below.  #Given the recent pneumonia-hold chemotherapy today.  Will reevaluate the disease status with chest abdomen pelvis CT scan.  Unfortunately patient has limited options especially given her poor tolerance to therapy.  #Brain mets status post [facility]; right parietal 4-5 mm lesion slightly bigger.  Clinically stable; check MRI ASAP  #Peripheral neuropathy grade 2 stable.  #Acute respiratory failure-pneumonia in the context of underlying adenocarcinoma the lung.  Continue to finish antibiotics.  Leukocytosis likely from prednisone.  #We will plan to obtain records from [place].   # de-access port today. Med records release sign  # follow up in 2 weeks/MD/labs-cbc/cmp- CT C/AP- in 2 weeks; MRI brain ASAP-Dr.B  Addendum: Debility/recent hospitalization-recommend physical therapy.

## 2017-10-17 NOTE — Progress Notes (Signed)
[facility] OFFICE PROGRESS NOTE  Patient Care Team: Rubye Beach as PCP - General (Family Medicine) Allyne Gee, MD as Referring Physician (Internal Medicine)  Cancer Staging No matching staging information was found for the patient.   Oncology History   # FEB 2017-ADENO CA Right Lower Lung T4N2M0- STAGE IIIB [Bronch & Subcarinal LNBx]- March 8th START CARBO-ALIMTA x4 cycles- June 8th 2017 - IMPROVED FDG MULTI-FOCAL lesions/N2-LN activity. S/p [place]-- Alimta x6  # AUG 18th- Alimta- Avastin maintenance; SEP 13th DISCONTINUE AVASTIN [sec to cavitary lesion]; OCT 20th 2017- CT-STABLE Disease; NOV 8th CT/PET [facility] clinic]- "overall slight progression-? Lymphangiectatic spread"  # Jan - April 2018Clement Husbands; progression  # April 25th 2018- Carbo- Taxol x3 ccyles; June 2018- CT chest STABLE Disease;July 25th- Add Avastin to [place] taxol x6 cycles- Sep 2018-CT scan- [facility] STABLE   # Sep 2018- Avastin Maintence;   JAN 9th CT- right lung progression [s/p Bx- Adeno; [facility]; F-one- 1/15]  # NOV 2nd 2017-MRI, [facility]- Brain mets [asymptomatic;Right frontal ~48m; ~340mlesions -appx 3-4 lesions; (right Frontal & Left parietal s/p GK x 2 lesions] ; June 2018- Progession vs radiation necrosis [July 2th-Add Avastin]  # [facility] 4 small subcentimeter brain lesions for which she had the Gamma knife. The main right frontal lesion gotten smaller; improve edema.  # Jan 2019- Progression [CT chest; [facility]]; Brain MRI- new sub cm lesions- monitored  # feb 14th 2019- Tax-Cyram  # AUG 28th 2019- Gem   --------------------------------------------------------------     MOLECULAR STUDIES:  KRAS MUTATED/Tissues not sufficient for OTHER the molecular marker; will need repeat Bx ; GUARDIANT TESTING [[facility]]- pending. # [phone]/[facility] No targettable mutations.   ------------------------------------------------------   DIAGNOSIS: [ FEB 2017]- ADENO CA LUNG  STAGE:   IV  ;GOALS: palliative  CURRENT/MOST RECENT THERAPY- Gem      Primary cancer of right lower lobe of lung ([facility]     INTERVAL HISTORY:  WaMali Eppard89.o.  female pleasant patient above history of multifocal adenocarcinoma the lung-most recently on gemcitabine is here for follow-up.  [place] [facility].  Work concerning for pneumonia/progressive lung cancer.  Patient was treated with antibiotics; initially needed BiPAP.  Patient is currently on oxygen.   Continues to complain of intermittent cough.  Otherwise shortness of breath is improved.  She continues to need oxygen around-the-clock.   Review of Systems  Constitutional: Positive for malaise/fatigue. Negative for chills, diaphoresis, fever and weight loss.  HENT: Negative for nosebleeds and sore throat.   Eyes: Negative for double vision.  Respiratory: Positive for cough, sputum production and shortness of breath. Negative for wheezing.   Cardiovascular: Negative for chest pain, palpitations, orthopnea and leg swelling.  Gastrointestinal: Negative for abdominal pain, blood in stool, constipation, diarrhea, heartburn, melena, nausea and vomiting.  Genitourinary: Negative for dysuria, frequency and urgency.  Musculoskeletal: Positive for back pain, joint pain and myalgias.  Skin: Negative.  Negative for itching and rash.  Neurological: Positive for tingling. Negative for dizziness, focal weakness, weakness and headaches.  Endo/Heme/Allergies: Does not bruise/bleed easily.  Psychiatric/Behavioral: Negative for depression. The patient is nervous/anxious. The patient does not have insomnia.       PAST MEDICAL HISTORY :  Past Medical History:  Diagnosis Date  . Anxiety   .  Cancer of right  lung ([place]) 02/13/2015, 01/13/17  . Depression   . Depression with anxiety    Well controlled with Wellbutrin and Buspar  . Pneumonia     PAST SURGICAL HISTORY :   Past Surgical History:  Procedure Laterality Date  . ECTOPIC PREGNANCY SURGERY  1988  . ELECTROMAGNETIC NAVIGATION BROCHOSCOPY N/A 02/10/2015   Procedure: ELECTROMAGNETIC NAVIGATION BRONCHOSCOPY;  Surgeon: Flora Lipps, MD;  Location: [facility] ORS;  Service: Cardiopulmonary;  Laterality: N/A;  . ENDOBRONCHIAL ULTRASOUND N/A 02/10/2015   Procedure: ENDOBRONCHIAL ULTRASOUND;  Surgeon: Flora Lipps, MD;  Location: [facility] ORS;  Service: Cardiopulmonary;  Laterality: N/A;  . PERIPHERAL VASCULAR CATHETERIZATION N/A 03/05/2015   Procedure: Glori Luis Cath Insertion;  Surgeon: Katha Cabal, MD;  Location: [facility] LAB;  Service: Cardiovascular;  Laterality: N/A;    FAMILY HISTORY :   Family History  Problem Relation Age of Onset  . Cancer Father        Prostate Cancer  . Hypertension Father   . Stroke Father   . Hypertension Brother     SOCIAL HISTORY:   Social History   Tobacco Use  . Smoking status: Former Smoker    Packs/day: 0.25    Years: 30.00    Pack years: 7.50    Types: Cigarettes    Last attempt to quit: 02/06/2005    Years since quitting: 12.7  . Smokeless tobacco: Never Used  Substance Use Topics  . Alcohol use: Yes    Comment: not drank any in several months  . Drug use: No    ALLERGIES:  has No Known Allergies.  MEDICATIONS:  Current Outpatient Medications  Medication Sig Dispense Refill  . acetaminophen (TYLENOL) 500 MG tablet Take 1,000 mg by mouth every 4 (four) hours as needed.     [name] Kitchen albuterol (PROVENTIL) (2.5 MG/3ML) 0.083% nebulizer solution USE 1 VIAL IN NEBULIZER EVERY 6 HOURS AS NEEDED FOR WHEEZING  0  . amoxicillin-clavulanate (AUGMENTIN) 875-125 MG tablet TAKE 1 TABLET BY MOUTH EVERY 12 HOURS FOR 7 DAYS  0  . BEVESPI AEROSPHERE 9-4.8 MCG/ACT AERO Inhale 2 puffs into the lungs 2 (two) times  daily.  0  . chlorpheniramine-HYDROcodone (TUSSIONEX) 10-8 MG/5ML SUER Take 5 mLs by mouth 2 (two) times daily. 250 mL 0  . dexamethasone (DECADRON) 4 MG tablet Take 1 tablet (4 mg total) by mouth 2 (two) times daily with a meal. Start the day prior to chemo; for 3 days. 60 tablet 3  . docusate sodium (COLACE) 100 MG capsule Take 100 mg by mouth 2 (two) times daily.    . fentaNYL (DURAGESIC - DOSED MCG/HR) 25 MCG/HR patch Place 1 patch (25 mcg total) onto the skin every 3 (three) days. 10 patch 0  . Fluticasone-Umeclidin-Vilant (TRELEGY ELLIPTA) 100-62.5-25 MCG/INH AEPB Inhale 1 puff into the lungs daily. 1 each 4  . HYDROMET 5-1.5 MG/5ML syrup TK 5 MLS PO Q 6 H PRN FOR COUGH FOR 3 DAYS  0  . ibuprofen (IBU) 400 MG tablet Take 1 tablet (400 mg total) by mouth 3 (three) times daily as needed. 30 tablet 0  . levofloxacin (LEVAQUIN) 500 MG tablet Take 1 tablet (500 mg total) by mouth daily. 10 tablet 0  . lidocaine-prilocaine (EMLA) cream Apply 1 application topically as needed. 30 g 6  . LORazepam (ATIVAN) 0.5 MG tablet Take 1 tablet (0.5 mg total) by mouth 2 (two) times daily. As needed for anxiety/ nausea 60 tablet 0  . magic mouthwash w/lidocaine SOLN Take 5  mLs by mouth 4 (four) times daily. 480 mL 3  . montelukast (SINGULAIR) 10 MG tablet Take 1 tablet (10 mg total) by mouth at bedtime. 10 tablet 0  . ondansetron (ZOFRAN) 8 MG tablet Take 1 tablet (8 mg total) by mouth every 8 (eight) hours as needed for nausea or vomiting (start 3 days; after chemo). 40 tablet 3  . Oxycodone HCl 10 MG TABS Take 1 tablet (10 mg total) by mouth every 6 (six) hours as needed (moderate to severe pain). 120 tablet 0  . polyethylene glycol (MIRALAX / GLYCOLAX) packet Take 17 g by mouth daily as needed.    . potassium chloride SA (K-DUR,KLOR-CON) 20 MEQ tablet Take 1 tablet (20 mEq total) by mouth 2 (two) times daily. 10 tablet 0  . predniSONE (DELTASONE) 10 MG tablet TAKE THREE TABLETS BY MOUTH DAILY FOR THREE DAYS.  THEN TAKE TWO TABLETS BY MOUTH DAILY FOR THREE DAYS. THEN TAKE ONE TABLET BY MOUTH DAILY F  0  . prochlorperazine (COMPAZINE) 10 MG tablet Take 1 tablet (10 mg total) by mouth every 6 (six) hours as needed for nausea or vomiting. 30 tablet 3  . TRELEGY ELLIPTA 100-62.5-25 MCG/INH AEPB INHALE 1 PUFF ONCE DAILY 60 each 4  . VENTOLIN HFA 108 (90 Base) MCG/ACT inhaler INHALE 1 PUFF BY MOUTH EVERY 6 HOURS  0  . zolpidem (AMBIEN) 5 MG tablet TAKE 1 TABLET BY MOUTH AT BEDTIME AS NEEDED FOR SLEEP 30 tablet 2   No current facility-administered medications for this visit.    Facility-Administered Medications Ordered in Other Visits  Medication Dose Route Frequency Provider Last Rate Last Dose  . 0.9 %  sodium chloride infusion   Intravenous Continuous Jacquelin Hawking, NP      . dexamethasone (DECADRON) injection 10 mg  10 mg Intravenous Once Faythe Casa E, NP      . heparin lock flush 100 unit/mL  500 Units Intravenous Once Cammie Sickle, MD        PHYSICAL EXAMINATION: ECOG PERFORMANCE STATUS: 1 - Symptomatic but completely ambulatory  BP 105/66 (BP Location: Left Arm, Patient Position: Sitting)   Pulse 76   Temp 97.8 F (36.6 C) (Tympanic)   Resp 16   Wt 101 lb 6.4 oz (46 kg)   LMP 06/16/2000 (Approximate) Comment: 15 yrs ago  BMI 17.96 kg/m   Filed Weights   10/17/17 0940  Weight: 101 lb 6.4 oz (46 kg)    Physical Exam  Constitutional: She is oriented to person, place, and time and well-developed, well-nourished, and in no distress.  HENT:  Head: Normocephalic and atraumatic.  Mouth/Throat: Oropharynx is clear and moist. No oropharyngeal exudate.  Eyes: Pupils are equal, round, and reactive to light.  Neck: Normal range of motion. Neck supple.  Cardiovascular: Normal rate and regular rhythm.  Pulmonary/Chest: No respiratory distress. She has no wheezes.  Right LL decreased breath sounds compared to left.   Abdominal: Soft. Bowel sounds are normal. She exhibits no  distension and no mass. There is no tenderness. There is no rebound and no guarding.  Musculoskeletal: Normal range of motion. She exhibits no edema or tenderness.  Neurological: She is alert and oriented to person, place, and time.  Skin: Skin is warm.  Psychiatric: Affect normal.  Tearful anxious.       LABORATORY DATA:  I have reviewed the data as listed    Component Value Date/Time   NA 135 10/17/2017 0855   NA 139 01/03/2015 0910  K 3.9 10/17/2017 0855   CL 96 (L) 10/17/2017 0855   CO2 29 10/17/2017 0855   GLUCOSE 102 (H) 10/17/2017 0855   BUN 16 10/17/2017 0855   BUN 10 01/03/2015 0910   CREATININE 0.43 (L) 10/17/2017 0855   CALCIUM 9.8 10/17/2017 0855   PROT 6.7 10/17/2017 0855   PROT 6.5 01/03/2015 0910   ALBUMIN 3.4 (L) 10/17/2017 0855   ALBUMIN 3.8 01/03/2015 0910   AST 25 10/17/2017 0855   ALT 25 10/17/2017 0855   ALKPHOS 54 10/17/2017 0855   BILITOT 0.7 10/17/2017 0855   BILITOT <0.2 01/03/2015 0910   GFRNONAA >60 10/17/2017 0855   GFRAA >60 10/17/2017 0855    No results found for: SPEP, UPEP  Lab Results  Component Value Date   WBC 22.1 (H) 10/17/2017   NEUTROABS 19.5 (H) 10/17/2017   HGB 10.0 (L) 10/17/2017   HCT 30.9 (L) 10/17/2017   MCV 103.0 (H) 10/17/2017   PLT 373 10/17/2017      Chemistry      Component Value Date/Time   NA 135 10/17/2017 0855   NA 139 01/03/2015 0910   K 3.9 10/17/2017 0855   CL 96 (L) 10/17/2017 0855   CO2 29 10/17/2017 0855   BUN 16 10/17/2017 0855   BUN 10 01/03/2015 0910   CREATININE 0.43 (L) 10/17/2017 0855      Component Value Date/Time   CALCIUM 9.8 10/17/2017 0855   ALKPHOS 54 10/17/2017 0855   AST 25 10/17/2017 0855   ALT 25 10/17/2017 0855   BILITOT 0.7 10/17/2017 0855   BILITOT <0.2 01/03/2015 0910       RADIOGRAPHIC STUDIES: I have personally reviewed the radiological images as listed and agreed with the findings in the report. No results found.   ASSESSMENT & PLAN:  Primary cancer of  right lower lobe of lung ([place]) # Metastatic adenocarcinoma the lung- on SEP 16th CT scan Progression of  increased in multi-focal adeno ca.  Most recently on gemcitabine; currently on hold given the recent acute respiratory failure/pneumonia [[place]]; see discussion below.  #Given the recent pneumonia-hold chemotherapy today.  Will reevaluate the disease status with chest abdomen pelvis CT scan.  Unfortunately patient has limited options especially given her poor tolerance to therapy.  #Brain mets status post [facility]; right parietal 4-5 mm lesion slightly bigger.  Clinically stable; check MRI ASAP  #Peripheral neuropathy grade 2 stable.  #Acute respiratory failure-pneumonia in the context of underlying adenocarcinoma the lung.  Continue to finish antibiotics.  Leukocytosis likely from prednisone.  #We will plan to obtain records from [place].   # de-access port today. Med records release sign  # follow up in 2 weeks/MD/labs-cbc/cmp- CT C/AP- in 2 weeks; MRI brain ASAP-Dr.B    Orders Placed This Encounter  Procedures  . CT CHEST W CONTRAST    Standing Status:   Future    Standing Expiration Date:   10/18/2018    Order Specific Question:   If indicated for the ordered procedure, I authorize the administration of contrast media per Radiology protocol    Answer:   Yes    Order Specific Question:   Preferred imaging location?    Answer:   [facility]    Order Specific Question:   Radiology Contrast Protocol - do NOT remove file path    Answer:   \\[email or web address]    Order Specific Question:   ** REASON FOR EXAM (FREE TEXT)    Answer:   lung cancer  .  CT Abdomen Pelvis W Contrast    Standing Status:   Future    Standing Expiration Date:   10/17/2018    Order Specific Question:   ** REASON FOR EXAM (FREE TEXT)    Answer:   lung cancer    Order Specific Question:   If indicated for the ordered procedure, I authorize the  administration of contrast media per Radiology protocol    Answer:   Yes    Order Specific Question:   Preferred imaging location?    Answer:   [facility]    Order Specific Question:   Is Oral Contrast requested for this exam?    Answer:   Yes, Per Radiology protocol    Order Specific Question:   Radiology Contrast Protocol - do NOT remove file path    Answer:   \\[email or web address]  . MR Brain W Wo Contrast    Standing Status:   Future    Standing Expiration Date:   10/17/2018    Order Specific Question:   ** REASON FOR EXAM (FREE TEXT)    Answer:   brain mets    Order Specific Question:   If indicated for the ordered procedure, I authorize the administration of contrast media per Radiology protocol    Answer:   Yes    Order Specific Question:   What is the patient's sedation requirement?    Answer:   No Sedation    Order Specific Question:   Does the patient have a pacemaker or implanted devices?    Answer:   No    Order Specific Question:   Radiology Contrast Protocol - do NOT remove file path    Answer:   \\[email or web address]    Order Specific Question:   Preferred imaging location?    Answer:   [facility] (table limit-350lbs)   All questions were answered. The patient knows to call the clinic with any problems, questions or concerns.      Cammie Sickle, MD 10/17/2017 9:19 PM

## 2017-10-19 ENCOUNTER — Telehealth: Payer: Self-pay | Admitting: *Deleted

## 2017-10-19 NOTE — Telephone Encounter (Signed)
For continuity of care, RN Faxed release of information for records to [facility] in [place], [place].

## 2017-10-20 ENCOUNTER — Telehealth: Payer: Self-pay | Admitting: *Deleted

## 2017-10-20 NOTE — Telephone Encounter (Signed)
incoming fax from [facility] in [place], [place]. Unable to locate patient's chart with the name and dob that we provided in the release of info.  Colette, contacted the patient's husband to confirm that patient was under [facility]. Per husband, he listed patient under [name]. I called and left vm for HIM dept in [facility]. [place]. And asked the dept to run this request back through their system. The demographics for this patient that was faxed with the originial request had Terrance Mass on the request form.

## 2017-10-21 ENCOUNTER — Inpatient Hospital Stay (HOSPITAL_BASED_OUTPATIENT_CLINIC_OR_DEPARTMENT_OTHER): Payer: BLUE CROSS/BLUE SHIELD | Admitting: Oncology

## 2017-10-21 ENCOUNTER — Encounter: Payer: Self-pay | Admitting: Oncology

## 2017-10-21 ENCOUNTER — Other Ambulatory Visit: Payer: Self-pay | Admitting: Oncology

## 2017-10-21 ENCOUNTER — Telehealth: Payer: Self-pay | Admitting: *Deleted

## 2017-10-21 ENCOUNTER — Other Ambulatory Visit: Payer: Self-pay

## 2017-10-21 ENCOUNTER — Inpatient Hospital Stay: Payer: BLUE CROSS/BLUE SHIELD

## 2017-10-21 VITALS — BP 94/64 | HR 97 | Resp 22

## 2017-10-21 DIAGNOSIS — D72829 Elevated white blood cell count, unspecified: Secondary | ICD-10-CM

## 2017-10-21 DIAGNOSIS — R000 Tachycardia, unspecified: Secondary | ICD-10-CM

## 2017-10-21 DIAGNOSIS — E860 Dehydration: Secondary | ICD-10-CM

## 2017-10-21 DIAGNOSIS — C3431 Malignant neoplasm of lower lobe, right bronchus or lung: Secondary | ICD-10-CM

## 2017-10-21 DIAGNOSIS — G629 Polyneuropathy, unspecified: Secondary | ICD-10-CM

## 2017-10-21 DIAGNOSIS — Z87891 Personal history of nicotine dependence: Secondary | ICD-10-CM

## 2017-10-21 DIAGNOSIS — M791 Myalgia, unspecified site: Secondary | ICD-10-CM

## 2017-10-21 DIAGNOSIS — R530 Neoplastic (malignant) related fatigue: Secondary | ICD-10-CM

## 2017-10-21 DIAGNOSIS — C7931 Secondary malignant neoplasm of brain: Secondary | ICD-10-CM | POA: Diagnosis not present

## 2017-10-21 DIAGNOSIS — J9601 Acute respiratory failure with hypoxia: Secondary | ICD-10-CM

## 2017-10-21 DIAGNOSIS — Z95828 Presence of other vascular implants and grafts: Secondary | ICD-10-CM

## 2017-10-21 DIAGNOSIS — Z9981 Dependence on supplemental oxygen: Secondary | ICD-10-CM

## 2017-10-21 DIAGNOSIS — E871 Hypo-osmolality and hyponatremia: Secondary | ICD-10-CM

## 2017-10-21 DIAGNOSIS — D649 Anemia, unspecified: Secondary | ICD-10-CM

## 2017-10-21 DIAGNOSIS — R05 Cough: Secondary | ICD-10-CM

## 2017-10-21 DIAGNOSIS — Z79899 Other long term (current) drug therapy: Secondary | ICD-10-CM

## 2017-10-21 DIAGNOSIS — R0601 Orthopnea: Secondary | ICD-10-CM

## 2017-10-21 LAB — CBC WITH DIFFERENTIAL/PLATELET
ABS IMMATURE GRANULOCYTES: 0.11 10*3/uL — AB (ref 0.00–0.07)
BASOS PCT: 0 %
Basophils Absolute: 0 10*3/uL (ref 0.0–0.1)
Eosinophils Absolute: 0 10*3/uL (ref 0.0–0.5)
Eosinophils Relative: 0 %
HCT: 29.6 % — ABNORMAL LOW (ref 36.0–46.0)
Hemoglobin: 9.5 g/dL — ABNORMAL LOW (ref 12.0–15.0)
Immature Granulocytes: 1 %
Lymphocytes Relative: 2 %
Lymphs Abs: 0.3 10*3/uL — ABNORMAL LOW (ref 0.7–4.0)
MCH: 32.6 pg (ref 26.0–34.0)
MCHC: 32.1 g/dL (ref 30.0–36.0)
MCV: 101.7 fL — AB (ref 80.0–100.0)
MONO ABS: 0.4 10*3/uL (ref 0.1–1.0)
Monocytes Relative: 2 %
NEUTROS ABS: 14.3 10*3/uL — AB (ref 1.7–7.7)
Neutrophils Relative %: 95 %
PLATELETS: 308 10*3/uL (ref 150–400)
RBC: 2.91 MIL/uL — ABNORMAL LOW (ref 3.87–5.11)
RDW: 19.3 % — ABNORMAL HIGH (ref 11.5–15.5)
WBC: 15.1 10*3/uL — ABNORMAL HIGH (ref 4.0–10.5)
nRBC: 0 % (ref 0.0–0.2)

## 2017-10-21 LAB — COMPREHENSIVE METABOLIC PANEL
ALBUMIN: 2.9 g/dL — AB (ref 3.5–5.0)
ALK PHOS: 59 U/L (ref 38–126)
ALT: 20 U/L (ref 0–44)
AST: 34 U/L (ref 15–41)
Anion gap: 11 (ref 5–15)
BILIRUBIN TOTAL: 0.5 mg/dL (ref 0.3–1.2)
BUN: 16 mg/dL (ref 8–23)
CALCIUM: 8.9 mg/dL (ref 8.9–10.3)
CO2: 27 mmol/L (ref 22–32)
Chloride: 95 mmol/L — ABNORMAL LOW (ref 98–111)
Creatinine, Ser: 0.69 mg/dL (ref 0.44–1.00)
GFR calc Af Amer: >60 mL/min (ref >60)
GFR calc non Af Amer: >60 mL/min (ref >60)
GLUCOSE: 246 mg/dL — AB (ref 70–99)
Potassium: 3.5 mmol/L (ref 3.5–5.1)
Sodium: 133 mmol/L — ABNORMAL LOW (ref 135–145)
TOTAL PROTEIN: 6.3 g/dL — AB (ref 6.5–8.1)

## 2017-10-21 MED ORDER — SODIUM CHLORIDE 0.9 % IV SOLN
Freq: Once | INTRAVENOUS | Status: AC
  Administered 2017-10-21: 15:00:00 via INTRAVENOUS
  Filled 2017-10-21: qty 250

## 2017-10-21 MED ORDER — PREDNISONE 10 MG PO TABS: ORAL_TABLET | ORAL | 0 refills | Status: DC

## 2017-10-21 MED ORDER — HEPARIN SOD (PORK) LOCK FLUSH 100 UNIT/ML IV SOLN
500.0000 [IU] | Freq: Once | INTRAVENOUS | Status: AC
  Administered 2017-10-21: 500 [IU] via INTRAVENOUS

## 2017-10-21 MED ORDER — SODIUM CHLORIDE 0.9% FLUSH
10.0000 mL | INTRAVENOUS | Status: DC | PRN
  Administered 2017-10-21: 10 mL via INTRAVENOUS
  Filled 2017-10-21: qty 10

## 2017-10-21 MED ORDER — SODIUM CHLORIDE 0.9 % IV SOLN
INTRAVENOUS | Status: AC
  Filled 2017-10-21: qty 250

## 2017-10-21 NOTE — Progress Notes (Signed)
Orders placed.

## 2017-10-21 NOTE — Progress Notes (Signed)
Symptom Management Consult note [facility]  Telephone:([phone] [phone]  Patient Care Team: Rubye Beach as PCP - General (Family Medicine) Allyne Gee, MD as Referring Physician (Internal Medicine)   Name of the patient: Deanna Blair  [id number]  Nov 08, 1955   Date of visit: 10/21/2017  Diagnosis: Metastatic adenocarcinoma of the lung  Chief Complaint: tachycardia/palpatations  Current Treatment: s/p cycle 1 day 8 Gemzar. Last given 09/07/2017  Oncology History: Patient last seen by oncologist Dr. Rogue Bussing on 10/17/2017 for follow-up.  At that visit, she just returned from a trip in [place].  She has been admitted to the hospital in [facility] for hypoxia and shortness of breath intially needing BiPAP.  Imaging was concerning for pneumonia/progression of lung cancer.  She was treated with antibiotics.  She was discharged with home oxygen requiring 24/7.  She continued to complain of intermittent cough, shortness of breath has mildly improved.   Gemcitabine was placed on hold until complete resolution of pneumonia.  It was recommended she continue antibiotics.  Recommended repeat imaging with CT abdomen/pelvis/chest and MRI of the brain ASAP. This is scheduled for 10/27/17 (CT) and 10/31/17 (MRI).   Patient has self stopped several medications. (noted in chart)  Oncology History   # FEB 2017-ADENO CA Right Lower Lung T4N2M0- STAGE IIIB [Bronch & Subcarinal LNBx]- March 8th START CARBO-ALIMTA x4 cycles- June 8th 2017 - IMPROVED FDG MULTI-FOCAL lesions/N2-LN activity. S/p [place]-- Alimta x6  # AUG 18th- Alimta- Avastin maintenance; SEP 13th DISCONTINUE AVASTIN [sec to cavitary lesion]; OCT 20th 2017- CT-STABLE Disease; NOV 8th CT/PET [facility]]- "overall slight progression-? Lymphangiectatic spread"  # Jan - April 2018Clement Husbands; progression  # April 25th 2018- Carbo- Taxol x3 ccyles; June 2018- CT chest STABLE Disease;July  25th- Add Avastin to [place] taxol x6 cycles- Sep 2018-CT scan- [facility] STABLE   # Sep 2018- Avastin Maintence;   JAN 9th CT- right lung progression [s/p Bx- [place]; [place] [facility]; F-one- 1/15]  # NOV 2nd 2017-MRI, [facility]- Brain mets [asymptomatic;Right frontal ~70m; ~318mlesions -appx 3-4 lesions; (right Frontal & Left parietal s/p GK x 2 lesions] ; June 2018- Progession vs radiation necrosis [July 2th-Add Avastin]  # [facility] 4 small subcentimeter brain lesions for which she had the Gamma knife. The main right frontal lesion gotten smaller; improve edema.  # Jan 2019- Progression [CT chest; [facility]]; Brain MRI- new sub cm lesions- monitored  # feb 14th 2019- Tax-Cyram  # AUG 28th 2019- Gem   --------------------------------------------------------------     MOLECULAR STUDIES:  KRAS MUTATED/Tissues not sufficient for OTHER the molecular marker; will need repeat Bx ; GUARDIANT TESTING [[facility]]- pending. # [phone][facility] No targettable mutations.  ------------------------------------------------------   DIAGNOSIS: [ FEB 2017]- ADENO CA LUNG  STAGE:   IV  ;GOALS: palliative  CURRENT/MOST RECENT THERAPY- Gem      Primary cancer of right lower lobe of lung [place]   Subjective Data:  Subjective:    WaJazzman Loughmillers a [age].o. female who presents with palpitations and shortness of breath. The symptoms are mild, occur intermittently, and last a few minutes per episode. They tend to occur while ambulating. Cardiac risk factors include: sedentary lifestyle and advanced lung cancer. Aggravating factors: prednisone taper and activity. Relieving factors: rest . Associated symptoms: palpitations, rapid heart beat and shortness of breath. Patient denies: abdominal pain, calf pain, chest pain,  leg swelling and syncope.  The following portions of the patient's history were reviewed and updated  as appropriate: allergies, current medications, past family history, past medical history, past social history, past surgical history and problem list.  Review of Systems A comprehensive review of systems was negative except for: Respiratory: positive for dyspnea on exertion, pneumonia and wheezing Cardiovascular: positive for dyspnea and fatigue Musculoskeletal: positive for muscle weakness   Objective:    BP 94/64 (BP Location: Left Arm, Patient Position: Sitting) Comment: standing bp 88/58  Pulse 97 Comment: standing pulse 107  Resp (!) 22   LMP 06/16/2000 (Approximate) Comment: 15 yrs ago  SpO2 92%   PF (!) 3 L/min  General appearance: alert and no distress Lungs: diminished breath sounds LLL, LUL and RUL and rhonchi RLL Heart: tachycardia- HR 107 Abdomen: soft, non-tender; bowel sounds normal; no masses,  no organomegaly Extremities: extremities normal, atraumatic, no cyanosis or edema Pulses: 2+ and symmetric Neurologic: Grossly normal  Cardiographics ECG: normal sinus rhythm with sinus arrhythmia   Assessment:    tachycardia d/t prednisone taper, recovering from pnuemonia and dehydration.   Plan:    Lung cancer: s/p cycle 1 day 8 gemcitabine q. 21 days.  Last given on 09/07/2017.  Tolerated first treatment well with only mild myalgias.  Seen several times in [facility] (in mid September) for fever of unknown origin, neutropenia and dehydration.  She was given fluids, antiemetics and steroids.  Treatment was discontinued until reevaluation by [facility] which was scheduled for early October.  Patient vacation in [place] (Early October) and was unfortunately admitted to the hospital for hypoxia and shortness of breath.  She was placed on BiPAP and remained in the hospital for approximately 2 weeks.  Diagnosis was pneumonia and or progression of disease.  Discharged with oral antibiotics, prednisone taper and oxygen dependent.  Plan is for imaging with CT scans of chest abdomen and pelvis  and MRI of brain to assess disease status.  This is scheduled for 10/27/17 and 10/31/17 with results and assessment by Dr. Rogue Bussing on 11/02/17.  Recently finished antibiotics.  Has 2 more days of prednisone taper.  Tachycardia/shortness of breath: Likely due to recovering from pneumonia, possible progression of lung cancer and dehydration. She is orthostatic. She has remained afebrile since discharge from hospital.   Plan: Stat EKG. normal sinus with sinus arrhythmia. Repeat labs.  Leukocytosis trending down.  Likely due to prednisone taper and recovering from pneumonia.  Anemia.  Mild hyponatremia. 500 mL's NaCl for orthostasis.  Heart rate improved after fluids.   Will extend out her steroids for a few additional days. RX prednisone 10 mg daily for 3 days and then 5 mg for four days.  Spoke at length about the importance of hydration.  Provided several coupons for Ensure clears.  Encouraged her to have 2-3 Ensure clears daily.   Encouraged her to use inhalers as prescribed for shortness of breath and wheezing.   She will RTC for imaging as scheduled next week and then on 11/02/17 for results, md assessment and treatment options.   Greater than 50% was spent in counseling and coordination of care with this patient including but not limited to discussion of the relevant topics above (See A&P) including, but not limited to diagnosis and management of acute and chronic medical conditions.   Faythe Casa, NP 10/21/2017 3:48 PM

## 2017-10-21 NOTE — Telephone Encounter (Signed)
Deanna Blair can see in [facility] to evaluate.

## 2017-10-21 NOTE — Telephone Encounter (Signed)
Patient reports that she has an increased heart rate with activity nadd that she notes that she is sweaty when she wakes up in the mornings. Asking if anything needs to be done. Please advise

## 2017-10-21 NOTE — Telephone Encounter (Signed)
Patient accepts appointment today at 230

## 2017-10-23 ENCOUNTER — Other Ambulatory Visit: Payer: Self-pay

## 2017-10-23 ENCOUNTER — Inpatient Hospital Stay
Admission: EM | Admit: 2017-10-23 | Discharge: 2017-10-28 | DRG: 189 | Disposition: A | Discharge status: Hospice | Payer: BLUE CROSS/BLUE SHIELD | Attending: Internal Medicine | Admitting: Internal Medicine

## 2017-10-23 ENCOUNTER — Emergency Department: Payer: BLUE CROSS/BLUE SHIELD

## 2017-10-23 DIAGNOSIS — C3490 Malignant neoplasm of unspecified part of unspecified bronchus or lung: Secondary | ICD-10-CM

## 2017-10-23 DIAGNOSIS — D649 Anemia, unspecified: Secondary | ICD-10-CM | POA: Diagnosis present

## 2017-10-23 DIAGNOSIS — Z515 Encounter for palliative care: Secondary | ICD-10-CM

## 2017-10-23 DIAGNOSIS — C3431 Malignant neoplasm of lower lobe, right bronchus or lung: Secondary | ICD-10-CM

## 2017-10-23 DIAGNOSIS — E876 Hypokalemia: Secondary | ICD-10-CM | POA: Diagnosis present

## 2017-10-23 DIAGNOSIS — F418 Other specified anxiety disorders: Secondary | ICD-10-CM | POA: Diagnosis present

## 2017-10-23 DIAGNOSIS — J9601 Acute respiratory failure with hypoxia: Secondary | ICD-10-CM

## 2017-10-23 DIAGNOSIS — C799 Secondary malignant neoplasm of unspecified site: Secondary | ICD-10-CM | POA: Diagnosis not present

## 2017-10-23 DIAGNOSIS — Z87891 Personal history of nicotine dependence: Secondary | ICD-10-CM

## 2017-10-23 DIAGNOSIS — Z7952 Long term (current) use of systemic steroids: Secondary | ICD-10-CM | POA: Diagnosis not present

## 2017-10-23 DIAGNOSIS — R739 Hyperglycemia, unspecified: Secondary | ICD-10-CM | POA: Diagnosis present

## 2017-10-23 DIAGNOSIS — G893 Neoplasm related pain (acute) (chronic): Secondary | ICD-10-CM | POA: Diagnosis not present

## 2017-10-23 DIAGNOSIS — Z809 Family history of malignant neoplasm, unspecified: Secondary | ICD-10-CM

## 2017-10-23 DIAGNOSIS — Z7951 Long term (current) use of inhaled steroids: Secondary | ICD-10-CM

## 2017-10-23 DIAGNOSIS — Z66 Do not resuscitate: Secondary | ICD-10-CM | POA: Diagnosis present

## 2017-10-23 DIAGNOSIS — J9621 Acute and chronic respiratory failure with hypoxia: Principal | ICD-10-CM | POA: Diagnosis present

## 2017-10-23 DIAGNOSIS — C7931 Secondary malignant neoplasm of brain: Secondary | ICD-10-CM | POA: Diagnosis present

## 2017-10-23 DIAGNOSIS — A419 Sepsis, unspecified organism: Secondary | ICD-10-CM | POA: Diagnosis present

## 2017-10-23 DIAGNOSIS — Z9221 Personal history of antineoplastic chemotherapy: Secondary | ICD-10-CM | POA: Diagnosis not present

## 2017-10-23 DIAGNOSIS — J9600 Acute respiratory failure, unspecified whether with hypoxia or hypercapnia: Secondary | ICD-10-CM | POA: Diagnosis not present

## 2017-10-23 DIAGNOSIS — Z79899 Other long term (current) drug therapy: Secondary | ICD-10-CM | POA: Diagnosis not present

## 2017-10-23 DIAGNOSIS — Z8701 Personal history of pneumonia (recurrent): Secondary | ICD-10-CM

## 2017-10-23 DIAGNOSIS — J9620 Acute and chronic respiratory failure, unspecified whether with hypoxia or hypercapnia: Secondary | ICD-10-CM | POA: Diagnosis not present

## 2017-10-23 DIAGNOSIS — I509 Heart failure, unspecified: Secondary | ICD-10-CM

## 2017-10-23 DIAGNOSIS — C3491 Malignant neoplasm of unspecified part of right bronchus or lung: Secondary | ICD-10-CM

## 2017-10-23 DIAGNOSIS — Z9981 Dependence on supplemental oxygen: Secondary | ICD-10-CM | POA: Diagnosis not present

## 2017-10-23 DIAGNOSIS — G629 Polyneuropathy, unspecified: Secondary | ICD-10-CM | POA: Diagnosis present

## 2017-10-23 DIAGNOSIS — T451X5A Adverse effect of antineoplastic and immunosuppressive drugs, initial encounter: Secondary | ICD-10-CM | POA: Diagnosis present

## 2017-10-23 DIAGNOSIS — F419 Anxiety disorder, unspecified: Secondary | ICD-10-CM | POA: Diagnosis not present

## 2017-10-23 DIAGNOSIS — Z8249 Family history of ischemic heart disease and other diseases of the circulatory system: Secondary | ICD-10-CM

## 2017-10-23 DIAGNOSIS — J9811 Atelectasis: Secondary | ICD-10-CM

## 2017-10-23 LAB — CBC WITH DIFFERENTIAL/PLATELET
Abs Immature Granulocytes: 0.09 10*3/uL — ABNORMAL HIGH (ref 0.00–0.07)
Basophils Absolute: 0 10*3/uL (ref 0.0–0.1)
Basophils Relative: 0 %
EOS ABS: 0.5 10*3/uL (ref 0.0–0.5)
EOS PCT: 3 %
HEMATOCRIT: 30.3 % — AB (ref 36.0–46.0)
Hemoglobin: 9.5 g/dL — ABNORMAL LOW (ref 12.0–15.0)
Immature Granulocytes: 1 %
Lymphocytes Relative: 9 %
Lymphs Abs: 1.3 10*3/uL (ref 0.7–4.0)
MCH: 32.6 pg (ref 26.0–34.0)
MCHC: 31.4 g/dL (ref 30.0–36.0)
MCV: 104.1 fL — AB (ref 80.0–100.0)
Monocytes Absolute: 0.8 10*3/uL (ref 0.1–1.0)
Monocytes Relative: 6 %
Neutro Abs: 11.4 10*3/uL — ABNORMAL HIGH (ref 1.7–7.7)
Neutrophils Relative %: 81 %
Platelets: 362 10*3/uL (ref 150–400)
RBC: 2.91 MIL/uL — ABNORMAL LOW (ref 3.87–5.11)
RDW: 19.2 % — AB (ref 11.5–15.5)
WBC: 14 10*3/uL — ABNORMAL HIGH (ref 4.0–10.5)
nRBC: 0 % (ref 0.0–0.2)

## 2017-10-23 LAB — BLOOD GAS, ARTERIAL
ACID-BASE DEFICIT: 1.4 mmol/L (ref 0.0–2.0)
Bicarbonate: 22.2 mmol/L (ref 20.0–28.0)
DELIVERY SYSTEMS: POSITIVE
Expiratory PAP: 5
FIO2: 0.5
INSPIRATORY PAP: 10
O2 Saturation: 86.9 %
PATIENT TEMPERATURE: 37
PCO2 ART: 32 mmHg (ref 32.0–48.0)
pH, Arterial: 7.45 (ref 7.350–7.450)
pO2, Arterial: 50 mmHg — ABNORMAL LOW (ref 83.0–108.0)

## 2017-10-23 LAB — BASIC METABOLIC PANEL
Anion gap: 13 (ref 5–15)
BUN: 11 mg/dL (ref 8–23)
CO2: 24 mmol/L (ref 22–32)
CREATININE: 0.63 mg/dL (ref 0.44–1.00)
Calcium: 8.8 mg/dL — ABNORMAL LOW (ref 8.9–10.3)
Chloride: 99 mmol/L (ref 98–111)
GFR calc non Af Amer: >60 mL/min (ref >60)
GLUCOSE: 139 mg/dL — AB (ref 70–99)
Potassium: 3.3 mmol/L — ABNORMAL LOW (ref 3.5–5.1)
Sodium: 136 mmol/L (ref 135–145)

## 2017-10-23 LAB — LACTIC ACID, PLASMA
Lactic Acid, Venous: 1.5 mmol/L (ref 0.5–1.9)
Lactic Acid, Venous: 1.7 mmol/L (ref 0.5–1.9)

## 2017-10-23 LAB — GLUCOSE, CAPILLARY: GLUCOSE-CAPILLARY: 137 mg/dL — AB (ref 70–99)

## 2017-10-23 LAB — TROPONIN I: Troponin I: <0.03 ng/mL (ref <0.03)

## 2017-10-23 LAB — BRAIN NATRIURETIC PEPTIDE: B NATRIURETIC PEPTIDE 5: 159 pg/mL — AB (ref 0.0–100.0)

## 2017-10-23 LAB — MRSA PCR SCREENING: MRSA by PCR: NEGATIVE

## 2017-10-23 LAB — PROCALCITONIN: PROCALCITONIN: 0.18 ng/mL

## 2017-10-23 MED ORDER — ACETAMINOPHEN 325 MG PO TABS: 650.0000 mg | ORAL_TABLET | Freq: Four times a day (QID) | ORAL | Status: DC | PRN

## 2017-10-23 MED ORDER — BUDESONIDE 0.25 MG/2ML IN SUSP
0.2500 mg | Freq: Two times a day (BID) | RESPIRATORY_TRACT | Status: DC
  Administered 2017-10-23 – 2017-10-28 (×9): 0.25 mg via RESPIRATORY_TRACT
  Filled 2017-10-23 (×9): qty 2

## 2017-10-23 MED ORDER — INFLUENZA VAC SPLIT QUAD 0.5 ML IM SUSY: 0.5000 mL | PREFILLED_SYRINGE | INTRAMUSCULAR | Status: DC

## 2017-10-23 MED ORDER — VANCOMYCIN HCL IN DEXTROSE 1-5 GM/200ML-% IV SOLN
1000.0000 mg | Freq: Once | INTRAVENOUS | Status: AC
  Administered 2017-10-23: 1000 mg via INTRAVENOUS
  Filled 2017-10-23: qty 200

## 2017-10-23 MED ORDER — VANCOMYCIN HCL IN DEXTROSE 750-5 MG/150ML-% IV SOLN
750.0000 mg | Freq: Two times a day (BID) | INTRAVENOUS | Status: DC
  Administered 2017-10-23 – 2017-10-24 (×2): 750 mg via INTRAVENOUS
  Filled 2017-10-23 (×3): qty 150

## 2017-10-23 MED ORDER — LORAZEPAM 0.5 MG PO TABS: 0.5000 mg | ORAL_TABLET | Freq: Two times a day (BID) | ORAL | Status: DC | PRN

## 2017-10-23 MED ORDER — METHYLPREDNISOLONE SODIUM SUCC 125 MG IJ SOLR
60.0000 mg | Freq: Four times a day (QID) | INTRAMUSCULAR | Status: DC
  Administered 2017-10-23 – 2017-10-25 (×8): 60 mg via INTRAVENOUS
  Filled 2017-10-23 (×8): qty 2

## 2017-10-23 MED ORDER — ONDANSETRON HCL 4 MG/2ML IJ SOLN: 4.0000 mg | Freq: Four times a day (QID) | INTRAMUSCULAR | Status: DC | PRN

## 2017-10-23 MED ORDER — IPRATROPIUM-ALBUTEROL 0.5-2.5 (3) MG/3ML IN SOLN
3.0000 mL | RESPIRATORY_TRACT | Status: DC
  Administered 2017-10-23 – 2017-10-28 (×27): 3 mL via RESPIRATORY_TRACT
  Filled 2017-10-23 (×29): qty 3

## 2017-10-23 MED ORDER — ACETAMINOPHEN 650 MG RE SUPP: 650.0000 mg | Freq: Four times a day (QID) | RECTAL | Status: DC | PRN

## 2017-10-23 MED ORDER — OXYCODONE HCL 5 MG PO TABS
10.0000 mg | ORAL_TABLET | ORAL | Status: DC | PRN
  Administered 2017-10-23 – 2017-10-24 (×6): 10 mg via ORAL
  Filled 2017-10-23 (×6): qty 2

## 2017-10-23 MED ORDER — SODIUM CHLORIDE 0.9 % IV SOLN
INTRAVENOUS | Status: DC
  Administered 2017-10-23 – 2017-10-24 (×2): via INTRAVENOUS

## 2017-10-23 MED ORDER — SODIUM CHLORIDE 0.9 % IV BOLUS (SEPSIS)
1000.0000 mL | Freq: Once | INTRAVENOUS | Status: AC
  Administered 2017-10-23: 1000 mL via INTRAVENOUS

## 2017-10-23 MED ORDER — LORAZEPAM 1 MG PO TABS
1.0000 mg | ORAL_TABLET | Freq: Four times a day (QID) | ORAL | Status: DC | PRN
  Administered 2017-10-24 – 2017-10-27 (×6): 1 mg via ORAL
  Filled 2017-10-23 (×6): qty 1

## 2017-10-23 MED ORDER — SODIUM CHLORIDE 0.9 % IV SOLN
2.0000 g | INTRAVENOUS | Status: DC
  Administered 2017-10-23: 2 g via INTRAVENOUS
  Filled 2017-10-23: qty 20

## 2017-10-23 MED ORDER — POTASSIUM CHLORIDE CRYS ER 20 MEQ PO TBCR
20.0000 meq | EXTENDED_RELEASE_TABLET | Freq: Two times a day (BID) | ORAL | Status: DC
  Administered 2017-10-23 – 2017-10-24 (×4): 20 meq via ORAL
  Filled 2017-10-23 (×4): qty 1

## 2017-10-23 MED ORDER — ONDANSETRON HCL 4 MG PO TABS: 4.0000 mg | ORAL_TABLET | Freq: Four times a day (QID) | ORAL | Status: DC | PRN

## 2017-10-23 MED ORDER — SODIUM CHLORIDE 0.9 % IV SOLN
2.0000 g | Freq: Three times a day (TID) | INTRAVENOUS | Status: DC
  Administered 2017-10-23 – 2017-10-25 (×6): 2 g via INTRAVENOUS
  Filled 2017-10-23 (×8): qty 2

## 2017-10-23 MED ORDER — SODIUM CHLORIDE 0.9 % IV SOLN
500.0000 mg | INTRAVENOUS | Status: DC
  Administered 2017-10-23 – 2017-10-24 (×2): 500 mg via INTRAVENOUS
  Filled 2017-10-23 (×2): qty 500

## 2017-10-23 MED ORDER — ENOXAPARIN SODIUM 30 MG/0.3ML ~~LOC~~ SOLN
30.0000 mg | SUBCUTANEOUS | Status: DC
  Administered 2017-10-23: 30 mg via SUBCUTANEOUS
  Filled 2017-10-23 (×2): qty 0.3

## 2017-10-23 MED ORDER — OXYCODONE HCL 5 MG PO TABS
10.0000 mg | ORAL_TABLET | Freq: Four times a day (QID) | ORAL | Status: DC | PRN
  Administered 2017-10-23: 10 mg via ORAL
  Filled 2017-10-23: qty 2

## 2017-10-23 MED ORDER — IPRATROPIUM-ALBUTEROL 0.5-2.5 (3) MG/3ML IN SOLN
RESPIRATORY_TRACT | Status: AC
  Administered 2017-10-23: 6 mL
  Filled 2017-10-23: qty 6

## 2017-10-23 NOTE — ED Notes (Signed)
RT at bedside.

## 2017-10-23 NOTE — Progress Notes (Signed)
   10/23/17 1500  Clinical Encounter Type  Visited With Family  Visit Type Follow-up;Spiritual support  Referral From  (Unit Secretary)  Recommendations Follow-up as requested.  Spiritual Encounters  Spiritual Needs Other (Comment)   Chaplain was called to return to patient's room to speak with husband and daughter. Patient had requested that they leave. Chaplain met the family in the waiting area.  Neither were "religious" and were curious why Chaplain had been requested. Chaplain respectfully explained that the patient needed rest and downtime. Chaplain offered to provide silent, energetic prayer to fulfill patient's earlier request. This was acceptable. Chaplain prayed for the patient as she continued to sleep. OR Completed.

## 2017-10-23 NOTE — Progress Notes (Signed)
Pharmacy Antibiotic Note  Deanna Blair is a 62 y.o. female admitted on 10/23/2017 with pneumonia.  Pharmacy has been consulted for Vancomycin and Cefepime dosing.    Plan: Vancomycin 750 IV every 12 hours.   Goal trough 15-20 mcg/mL.  Trough level ordered for 10/22 at 05:30.   Cefepime 2g IV every 8 hours.  Height: 5\' 3"  (160 cm) Weight: 108 lb 0.4 oz (49 kg) IBW/kg (Calculated) : 52.4  Temp (24hrs), Avg:98.7 F (37.1 C), Min:98.5 F (36.9 C), Max:98.8 F (37.1 C)  Recent Labs  Lab 10/17/17 0855 10/21/17 1443 10/23/17 0538  WBC 22.1* 15.1* 14.0*  CREATININE 0.43* 0.69 0.63  LATICACIDVEN  --   --  1.5    Estimated Creatinine Clearance: 56.4 mL/min (by C-G formula based on SCr of 0.63 mg/dL).    No Known Allergies  Antimicrobials this admission: Azithromycin 10/20 >> Vancomycin 10/20 >>  Ceftriaxone 10/20 >> 10/20 Cefepime 10/20 >>  Dose adjustments this admission:   Microbiology results: 10/20 BCx: pending 10/20 MRSA PCR: pending  Thank you for allowing pharmacy to be a part of this patient's care.  Olivia Canter, [facility] 10/23/2017 10:00 AM

## 2017-10-23 NOTE — ED Provider Notes (Signed)
[facility] Emergency Department Provider Note    First MD Initiated Contact with Patient 10/23/17 [phone]     (approximate)  I have reviewed the triage vital signs and the nursing notes.   HISTORY  Chief Complaint Shortness of Breath    HPI Deanna Blair is a 62 y.o. female with history of metastatic lung cancer presents to the emergency department with progressive dyspnea x2 days.  Patient also admits to cough.  Patient and husband state that the patient's oxygen saturation at home is been low patient oxygen saturation on arrival to the emergency department 65% on baseline 4 L.  Patient denies any fever afebrile on presentation.   Past Medical History:  Diagnosis Date  . Anxiety   . Cancer of right lung ([place]) 02/13/2015, 01/13/17  . Depression   . Depression with anxiety    Well controlled with Wellbutrin and Buspar  . Pneumonia     Patient Active Problem List   Diagnosis Date Noted  . 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 Joint pain following chemotherapy 02/28/2017  . Mucositis due to chemotherapy 02/28/2017  . Cancer associated pain 02/28/2017  . Leg pain, bilateral 12/01/2016  . Seborrheic keratosis 11/18/2016  . Cerebral edema ([place]) 06/21/2016  . Counseling regarding goals of care 01/28/2016  . Brain metastasis ([place]) 01/07/2016  . Malignant neoplasm of upper lobe, unspecified bronchus or lung ([place]) 10/09/2015  . Primary cancer of right lower lobe of lung ([place]) 10/08/2015  . Encounter for antineoplastic chemotherapy 06/24/2015  . Adenopathy   . 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 Cardiac enlargement 01/03/2015  . SOB (shortness of breath) 01/02/2015  . History of tobacco abuse 12/20/2014  . Personal history of nicotine dependence 12/20/2014  . History of cervical cancer 12/11/2014  . Tobacco abuse counseling 12/26/2012    Past Surgical History:  Procedure Laterality Date  .  ECTOPIC PREGNANCY SURGERY  1988  . ELECTROMAGNETIC NAVIGATION BROCHOSCOPY N/A 02/10/2015   Procedure: ELECTROMAGNETIC NAVIGATION BRONCHOSCOPY;  Surgeon: Flora Lipps, MD;  Location: [facility] ORS;  Service: Cardiopulmonary;  Laterality: N/A;  . ENDOBRONCHIAL ULTRASOUND N/A 02/10/2015   Procedure: ENDOBRONCHIAL ULTRASOUND;  Surgeon: Flora Lipps, MD;  Location: [facility] ORS;  Service: Cardiopulmonary;  Laterality: N/A;  . PERIPHERAL VASCULAR CATHETERIZATION N/A 03/05/2015   Procedure: Glori Luis Cath Insertion;  Surgeon: Katha Cabal, MD;  Location: [place] [facility];  Service: Cardiovascular;  Laterality: N/A;    Prior to Admission medications   Medication Sig Start Date End Date Taking? Authorizing Provider  acetaminophen (TYLENOL) 500 MG tablet Take 1,000 mg by mouth every 4 (four) hours as needed.     [provider]  albuterol (PROVENTIL) (2.5 MG/3ML) 0.083% nebulizer solution USE 1 VIAL IN NEBULIZER EVERY 6 HOURS AS NEEDED FOR WHEEZING 10/12/17   [provider]  BEVESPI AEROSPHERE 9-4.8 MCG/ACT AERO Inhale 2 puffs into the lungs 2 (two) times daily. 10/12/17   [provider]  chlorpheniramine-HYDROcodone (TUSSIONEX) 10-8 MG/5ML SUER Take 5 mLs by mouth 2 (two) times daily. Patient not taking: Reported on 10/21/2017 09/21/17   Cammie Sickle, MD  dexamethasone (DECADRON) 4 MG tablet Take 1 tablet (4 mg total) by mouth 2 (two) times daily with a meal. Start the day prior to chemo; for 3 days. Patient not taking: Reported on 10/21/2017 02/14/17   Cammie Sickle, MD  docusate sodium (COLACE) 100 MG capsule Take 100 mg by mouth 2 (two) times daily.  [provider]  fentaNYL (DURAGESIC - DOSED MCG/HR) 25 MCG/HR patch Place 1 patch (25 mcg total) onto the skin every 3 (three) days. Patient not taking: Reported on 10/21/2017 09/09/17   Cammie Sickle, MD  Fluticasone-Umeclidin-Vilant (TRELEGY ELLIPTA) 100-62.5-25 MCG/INH AEPB Inhale 1 puff into the lungs  daily. Patient not taking: Reported on 10/21/2017 07/28/17   Allyne Gee, MD  HYDROMET 5-1.5 MG/5ML syrup TK 5 MLS PO Q 6 H PRN FOR COUGH FOR 3 DAYS 10/12/17   [provider]  ibuprofen (IBU) 400 MG tablet Take 1 tablet (400 mg total) by mouth 3 (three) times daily as needed. Patient not taking: Reported on 10/21/2017 07/11/17   Cammie Sickle, MD  lidocaine-prilocaine (EMLA) cream Apply 1 application topically as needed. 12/22/16   Cammie Sickle, MD  LORazepam (ATIVAN) 0.5 MG tablet Take 1 tablet (0.5 mg total) by mouth 2 (two) times daily. As needed for anxiety/ nausea 08/24/17   Cammie Sickle, MD  ondansetron (ZOFRAN) 8 MG tablet Take 1 tablet (8 mg total) by mouth every 8 (eight) hours as needed for nausea or vomiting (start 3 days; after chemo). Patient not taking: Reported on 10/21/2017 08/31/17   Cammie Sickle, MD  Oxycodone HCl 10 MG TABS Take 1 tablet (10 mg total) by mouth every 6 (six) hours as needed (moderate to severe pain). 09/21/17   Cammie Sickle, MD  polyethylene glycol (MIRALAX / GLYCOLAX) packet Take 17 g by mouth daily as needed.    [provider]  potassium chloride SA (K-DUR,KLOR-CON) 20 MEQ tablet Take 1 tablet (20 mEq total) by mouth 2 (two) times daily. 09/12/17   Jacquelin Hawking, NP  predniSONE (DELTASONE) 10 MG tablet TAKE THREE TABLETS BY MOUTH DAILY FOR THREE DAYS. THEN TAKE TWO TABLETS BY MOUTH DAILY FOR THREE DAYS. THEN TAKE ONE TABLET BY MOUTH DAILY F 10/12/17   [provider]  predniSONE (DELTASONE) 10 MG tablet Take 1 tablet by mouth daily for 3 days and 0.5 tablet until gone. 10/21/17   Jacquelin Hawking, NP  prochlorperazine (COMPAZINE) 10 MG tablet Take 1 tablet (10 mg total) by mouth every 6 (six) hours as needed for nausea or vomiting. Patient not taking: Reported on 10/21/2017 08/31/17   Cammie Sickle, MD  VENTOLIN HFA 108 [phone] Base) MCG/ACT inhaler INHALE 1 PUFF BY MOUTH EVERY 6 HOURS  10/12/17   [provider]    Allergies No known drug allergies  Family History  Problem Relation Age of Onset  . Cancer Father        Prostate Cancer  . Hypertension Father   . Stroke Father   . Hypertension Brother     Social History Social History   Tobacco Use  . Smoking status: Former Smoker    Packs/day: 0.25    Years: 30.00    Pack years: 7.50    Types: Cigarettes    Last attempt to quit: 02/06/2005    Years since quitting: 12.7  . Smokeless tobacco: Never Used  Substance Use Topics  . Alcohol use: Yes    Comment: not drank any in several months  . Drug use: No    Review of Systems Constitutional: No fever/chills Eyes: No visual changes. ENT: No sore throat. Cardiovascular: Denies chest pain. Respiratory: Positive for shortness of breath and cough Gastrointestinal: No abdominal pain.  No nausea, no vomiting.  No diarrhea.  No constipation. Genitourinary: Negative for dysuria. Musculoskeletal: Negative for neck pain.  Negative  for back pain. Integumentary: Negative for rash. Neurological: Negative for headaches, focal weakness or numbness.   ____________________________________________   PHYSICAL EXAM:  VITAL SIGNS: ED Triage Vitals  Enc Vitals Group     BP 10/23/17 0529 (!) 112/59     Pulse Rate 10/23/17 0529 (!) 125     Resp 10/23/17 0529 (!) 26     Temp 10/23/17 0529 98.5 F (36.9 C)     Temp src --      SpO2 10/23/17 0529 (!) 65 %     Weight 10/23/17 0535 46 kg (101 lb 6.2 oz)     Height 10/23/17 0535 1.6 m (5' 3" )     Head Circumference --      Peak Flow --      Pain Score 10/23/17 0535 0     Pain Loc --      Pain Edu? --      Excl. in [place]? --     Constitutional: Alert and oriented.  Apparent respiratory difficulty Eyes: Conjunctivae are normal.  Head: Atraumatic. Mouth/Throat: Mucous membranes are moist.  Oropharynx non-erythematous. Neck: No stridor.   Cardiovascular: Tachycardia, regular rhythm. Good peripheral  circulation. Grossly normal heart sounds. Respiratory: Tachypnea, positive accessory respiratory muscle use, diffuse right lung field rhonchi Gastrointestinal: Soft and nontender. No distention.  Musculoskeletal: No lower extremity tenderness nor edema. No gross deformities of extremities. Neurologic:  Normal speech and language. No gross focal neurologic deficits are appreciated.  Skin:  Skin is warm, dry and intact. No rash noted. Psychiatric: Mood and affect are normal. Speech and behavior are normal.  ____________________________________________   LABS (all labs ordered are listed, but only abnormal results are displayed)  Labs Reviewed  CBC WITH DIFFERENTIAL/PLATELET - Abnormal; Notable for the following components:      Result Value   WBC 14.0 (*)    RBC 2.91 (*)    Hemoglobin 9.5 (*)    HCT 30.3 (*)    MCV 104.1 (*)    RDW 19.2 (*)    Neutro Abs 11.4 (*)    Abs Immature Granulocytes 0.09 (*)    All other components within normal limits  CULTURE, BLOOD (ROUTINE X 2)  CULTURE, BLOOD (ROUTINE X 2)  BASIC METABOLIC PANEL  TROPONIN I  LACTIC ACID, PLASMA  LACTIC ACID, PLASMA   ____________________________________________  EKG  ED ECG REPORT I, [place] N Hobert Popl[place]i, the attending physician, personally viewed and interpreted this ECG.   Date: 10/23/2017  EKG Time: 5:35 AM  Rate: 110  Rhythm: Sinus tachycardia  Axis: Normal  Intervals: Normal  ST&T Change: None  ____________________________________________  RADIOLOGY I, [place] Natanael Saladin, personally viewed and evaluated these images (plain radiographs) as part of my medical decision making, as well as reviewing the written report by the radiologist.  ED MD interpretation: Chronic right lung reticular opacities which have progressed since last x-ray on 09/20/2017  Official radiology report(s): Dg Chest Port 1 View  Result Date: 10/23/2017 CLINICAL DATA:  Shortness of breath EXAM: PORTABLE CHEST 1 VIEW COMPARISON:   Chest CT 09/20/2017 FINDINGS: Extensive reticular opacities throughout the right lung are compatible with the findings of the chest CT 09/20/2017, but have progressed since that time. The left lung remains relatively clear. Right chest wall Port-A-Cath tip is at the cavoatrial junction.Small right pleural effusion. IMPRESSION: Chronic right lung reticular opacities, which have progressed compared to the radiograph 09/15/2017. Small right pleural effusion. Electronically Signed   By: Ulyses Jarred M.D.   On: 10/23/2017 06:14  ____________________________________________   PROCEDURES  Critical Care performed:   .Critical Care Performed by: Gregor Hams, MD Authorized by: Gregor Hams, MD   Critical care provider statement:    Critical care time (minutes):  45   Critical care time was exclusive of:  Separately billable procedures and treating other patients and teaching time   Critical care was time spent personally by me on the following activities:  Development of treatment plan with patient or surrogate, discussions with consultants, evaluation of patient's response to treatment, examination of patient, obtaining history from patient or surrogate, ordering and performing treatments and interventions, ordering and review of laboratory studies, ordering and review of radiographic studies, pulse oximetry, re-evaluation of patient's condition and review of old charts   I assumed direction of critical care for this patient from another provider in my specialty: no       ____________________________________________   INITIAL IMPRESSION / ASSESSMENT AND PLAN / ED COURSE  As part of my medical decision making, I reviewed the following data within the electronic MEDICAL RECORD NUMBER   62 year old female presented with above-stated history and physical exam secondary to respiratory distress with hypoxia.  Concern for possible pneumonia versus pulmonary emboli.  Patient given 2 DuoNeb's in  the emergency department with mild improvement in respiratory status.  Patient remains tachypneic and hypoxic and as such BiPAP applied.  Patient met sepsis criteria on arrival and as such appropriate antibiotic therapy was administered.  Patient discussed with Dr. Posey Pronto for hospital admission further evaluation and management ____________________________________________  FINAL CLINICAL IMPRESSION(S) / ED DIAGNOSES  Final diagnoses:  Sepsis, due to unspecified organism, unspecified whether acute organ dysfunction present [facility])     MEDICATIONS GIVEN DURING THIS VISIT:  Medications  cefTRIAXone (ROCEPHIN) 2 g in sodium chloride 0.9 % 100 mL IVPB (2 g Intravenous New Bag/Given 10/23/17 0612)  azithromycin (ZITHROMAX) 500 mg in sodium chloride 0.9 % 250 mL IVPB (500 mg Intravenous New Bag/Given 10/23/17 2993)  sodium chloride 0.9 % bolus 1,000 mL (1,000 mLs Intravenous New Bag/Given 10/23/17 0616)    Followed by  sodium chloride 0.9 % bolus 1,000 mL (1,000 mLs Intravenous New Bag/Given 10/23/17 0616)  ipratropium-albuterol (DUONEB) 0.5-2.5 (3) MG/3ML nebulizer solution (6 mLs  Given 10/23/17 0549)     ED Discharge Orders    None       Note:  This document was prepared using Dragon voice recognition software and may include unintentional dictation errors.    Gregor Hams, MD 10/23/17 [phone]

## 2017-10-23 NOTE — Progress Notes (Signed)
   10/23/17 1052  Clinical Encounter Type  Visited With Patient;Family  Visit Type Initial;Psychological support  Referral From Nurse  Recommendations Follow-up, later in the day.  Spiritual Encounters  Spiritual Needs Prayer;Emotional   Following up on an OR for prayer, Chaplain engaged patient and guests. Patient was teary and talking softly with guests (family?). Patient has received a difficult diagnosis. Chaplain and patient agreed that a later time for a pastoral visit would be best for her. Chaplain will return later.

## 2017-10-23 NOTE — ED Triage Notes (Signed)
Patient reports woke up short of breath and coughing.  States blood pressure at home was low.

## 2017-10-23 NOTE — ED Notes (Signed)
ED Provider at bedside. 

## 2017-10-23 NOTE — Progress Notes (Signed)
CODE SEPSIS - PHARMACY COMMUNICATION  **Broad Spectrum Antibiotics should be administered within 1 hour of Sepsis diagnosis**  Time Code Sepsis Called/Page Received: 0211 1735  Antibiotics Ordered: 1020 0539  Time of 1st antibiotic administration: 1020 0612  Additional action taken by pharmacy:   If necessary, Name of Provider/Nurse Contacted:     Eloise Harman ,PharmD Clinical Pharmacist  10/23/2017  6:17 AM

## 2017-10-23 NOTE — H&P (Signed)
[facility] [place] NAME: Deanna Blair    MR#:  [id number]  DATE OF BIRTH:  12-Nov-1955  DATE OF ADMISSION:  10/23/2017  PRIMARY CARE PHYSICIAN: Mar Daring, PA-C   REQUESTING/REFERRING PHYSICIAN:  Gregor Hams, MD     CHIEF COMPLAINT:   Chief Complaint  Patient presents with  . Shortness of Breath    HISTORY OF PRESENT ILLNESS: Deanna Blair  is a 62 y.o. female with a known history of adenocarcinoma of the lung who is presenting with shortness of breath worsening over the past 2 days.  Patient reports that she was recently hospitalized in [place] and was discharged 10 days ago.  At that time she was diagnosed with pneumonia and was treated with antibiotics.  She felt better.  Now comes to the emergency room and acute respiratory distress requiring BiPAP.  Patient complains of cough which is productive of clear sputum.  Denies any fevers complains of chills.   PAST MEDICAL HISTORY:   Past Medical History:  Diagnosis Date  . Anxiety   . Cancer of right lung ([place]) 02/13/2015, 01/13/17  . Depression   . Depression with anxiety    Well controlled with Wellbutrin and Buspar  . Pneumonia     PAST SURGICAL HISTORY:  Past Surgical History:  Procedure Laterality Date  . ECTOPIC PREGNANCY SURGERY  1988  . ELECTROMAGNETIC NAVIGATION BROCHOSCOPY N/A 02/10/2015   Procedure: ELECTROMAGNETIC NAVIGATION BRONCHOSCOPY;  Surgeon: Flora Lipps, MD;  Location: [facility];  Service: Cardiopulmonary;  Laterality: N/A;  . ENDOBRONCHIAL ULTRASOUND N/A 02/10/2015   Procedure: ENDOBRONCHIAL ULTRASOUND;  Surgeon: Flora Lipps, MD;  Location: [facility];  Service: Cardiopulmonary;  Laterality: N/A;  . PERIPHERAL VASCULAR CATHETERIZATION N/A 03/05/2015   Procedure: Glori Luis Cath Insertion;  Surgeon: Katha Cabal, MD;  Location: [facility];  Service: Cardiovascular;  Laterality: N/A;    SOCIAL HISTORY:  Social History   Tobacco Use  . Smoking  status: Former Smoker    Packs/day: 0.25    Years: 30.00    Pack years: 7.50    Types: Cigarettes    Last attempt to quit: 02/06/2005    Years since quitting: 12.7  . Smokeless tobacco: Never Used  Substance Use Topics  . Alcohol use: Yes    Comment: not drank any in several months    FAMILY HISTORY:  Family History  Problem Relation Age of Onset  . Cancer Father        Prostate Cancer  . Hypertension Father   . Stroke Father   . Hypertension Brother     DRUG ALLERGIES: No Known Allergies  REVIEW OF SYSTEMS:   CONSTITUTIONAL: No fever, fatigue or weakness.  EYES: No blurred or double vision.  EARS, NOSE, AND THROAT: No tinnitus or ear pain.  RESPIRATORY: Positive cough, positive shortness of breath, no wheezing or hemoptysis.  CARDIOVASCULAR: No chest pain, orthopnea, edema.  GASTROINTESTINAL: No nausea, vomiting, diarrhea or abdominal pain.  GENITOURINARY: No dysuria, hematuria.  ENDOCRINE: No polyuria, nocturia,  HEMATOLOGY: No anemia, easy bruising or bleeding SKIN: No rash or lesion. MUSCULOSKELETAL: No joint pain or arthritis.   NEUROLOGIC: No tingling, numbness, weakness.  PSYCHIATRY: No anxiety or depression.   MEDICATIONS AT HOME:  Prior to Admission medications   Medication Sig Start Date End Date Taking? Authorizing Provider  acetaminophen (TYLENOL) 500 MG tablet Take 1,000 mg by mouth every 4 (four) hours as needed.     [provider]  albuterol (  PROVENTIL) (2.5 MG/3ML) 0.083% nebulizer solution USE 1 VIAL IN NEBULIZER EVERY 6 HOURS AS NEEDED FOR WHEEZING 10/12/17   [provider]  BEVESPI AEROSPHERE 9-4.8 MCG/ACT AERO Inhale 2 puffs into the lungs 2 (two) times daily. 10/12/17   [provider]  chlorpheniramine-HYDROcodone (TUSSIONEX) 10-8 MG/5ML SUER Take 5 mLs by mouth 2 (two) times daily. Patient not taking: Reported on 10/21/2017 09/21/17   Cammie Sickle, MD  dexamethasone (DECADRON) 4 MG tablet Take 1 tablet (4 mg  total) by mouth 2 (two) times daily with a meal. Start the day prior to chemo; for 3 days. Patient not taking: Reported on 10/21/2017 02/14/17   Cammie Sickle, MD  docusate sodium (COLACE) 100 MG capsule Take 100 mg by mouth 2 (two) times daily.    [provider]  fentaNYL (DURAGESIC - DOSED MCG/HR) 25 MCG/HR patch Place 1 patch (25 mcg total) onto the skin every 3 (three) days. Patient not taking: Reported on 10/21/2017 09/09/17   Cammie Sickle, MD  Fluticasone-Umeclidin-Vilant (TRELEGY ELLIPTA) 100-62.5-25 MCG/INH AEPB Inhale 1 puff into the lungs daily. Patient not taking: Reported on 10/21/2017 07/28/17   Allyne Gee, MD  HYDROMET 5-1.5 MG/5ML syrup TK 5 MLS PO Q 6 H PRN FOR COUGH FOR 3 DAYS 10/12/17   [provider]  ibuprofen (IBU) 400 MG tablet Take 1 tablet (400 mg total) by mouth 3 (three) times daily as needed. Patient not taking: Reported on 10/21/2017 07/11/17   Cammie Sickle, MD  lidocaine-prilocaine (EMLA) cream Apply 1 application topically as needed. 12/22/16   Cammie Sickle, MD  LORazepam (ATIVAN) 0.5 MG tablet Take 1 tablet (0.5 mg total) by mouth 2 (two) times daily. As needed for anxiety/ nausea 08/24/17   Cammie Sickle, MD  ondansetron (ZOFRAN) 8 MG tablet Take 1 tablet (8 mg total) by mouth every 8 (eight) hours as needed for nausea or vomiting (start 3 days; after chemo). Patient not taking: Reported on 10/21/2017 08/31/17   Cammie Sickle, MD  Oxycodone HCl 10 MG TABS Take 1 tablet (10 mg total) by mouth every 6 (six) hours as needed (moderate to severe pain). 09/21/17   Cammie Sickle, MD  polyethylene glycol (MIRALAX / GLYCOLAX) packet Take 17 g by mouth daily as needed.    [provider]  potassium chloride SA (K-DUR,KLOR-CON) 20 MEQ tablet Take 1 tablet (20 mEq total) by mouth 2 (two) times daily. 09/12/17   Jacquelin Hawking, NP  predniSONE (DELTASONE) 10 MG tablet TAKE THREE TABLETS BY MOUTH  DAILY FOR THREE DAYS. THEN TAKE TWO TABLETS BY MOUTH DAILY FOR THREE DAYS. THEN TAKE ONE TABLET BY MOUTH DAILY F 10/12/17   [provider]  predniSONE (DELTASONE) 10 MG tablet Take 1 tablet by mouth daily for 3 days and 0.5 tablet until gone. 10/21/17   Jacquelin Hawking, NP  prochlorperazine (COMPAZINE) 10 MG tablet Take 1 tablet (10 mg total) by mouth every 6 (six) hours as needed for nausea or vomiting. Patient not taking: Reported on 10/21/2017 08/31/17   Cammie Sickle, MD  VENTOLIN HFA 108 [phone] Base) MCG/ACT inhaler INHALE 1 PUFF BY MOUTH EVERY 6 HOURS 10/12/17   [provider]      PHYSICAL EXAMINATION:   VITAL SIGNS: Blood pressure (!) 108/58, pulse (!) 104, temperature 98.5 F (36.9 C), resp. rate (!) 22, height 5\' 3"  (1.6 m), weight 46 kg, last menstrual period 06/16/2000, SpO2 100 %.  GENERAL:  62 y.o.-year-old  patient lying in the bed with mild respiratory distress eYES: Pupils equal, round, reactive to light and accommodation. No scleral icterus. Extraocular muscles intact.  HEENT: Head atraumatic, normocephalic. Oropharynx and nasopharynx clear.  NECK:  Supple, no jugular venous distention. No thyroid enlargement, no tenderness.  LUNGS: Rhonchus breath sounds bilaterally without any accessory muscle usage CARDIOVASCULAR: S1, S2 normal. No murmurs, rubs, or gallops.  ABDOMEN: Soft, nontender, nondistended. Bowel sounds present. No organomegaly or mass.  EXTREMITIES: No pedal edema, cyanosis, or clubbing.  NEUROLOGIC: Cranial nerves II through XII are intact. Muscle strength 5/5 in all extremities. Sensation intact. Gait not checked.  PSYCHIATRIC: The patient is alert and oriented x 3.  SKIN: No obvious rash, lesion, or ulcer.   LABORATORY PANEL:   CBC Recent Labs  Lab 10/17/17 0855 10/21/17 1443 10/23/17 0538  WBC 22.1* 15.1* 14.0*  HGB 10.0* 9.5* 9.5*  HCT 30.9* 29.6* 30.3*  PLT 373 308 362  MCV 103.0* 101.7* 104.1*  MCH 33.3 32.6 32.6  MCHC  32.4 32.1 31.4  RDW 19.9* 19.3* 19.2*  LYMPHSABS 1.2 0.3* 1.3  MONOABS 1.0 0.4 0.8  EOSABS 0.2 0.0 0.5  BASOSABS 0.0 0.0 0.0   ------------------------------------------------------------------------------------------------------------------  Chemistries  Recent Labs  Lab 10/17/17 0855 10/21/17 1443 10/23/17 0538  NA 135 133* 136  K 3.9 3.5 3.3*  CL 96* 95* 99  CO2 29 27 24   GLUCOSE 102* 246* 139*  BUN 16 16 11   CREATININE 0.43* 0.69 0.63  CALCIUM 9.8 8.9 8.8*  AST 25 34  --   ALT 25 20  --   ALKPHOS 54 59  --   BILITOT 0.7 0.5  --    ------------------------------------------------------------------------------------------------------------------ estimated creatinine clearance is 52.9 mL/min (by C-G formula based on SCr of 0.63 mg/dL). ------------------------------------------------------------------------------------------------------------------ No results for input(s): TSH, T4TOTAL, T3FREE, THYROIDAB in the last 72 hours.  Invalid input(s): FREET3   Coagulation profile No results for input(s): INR, PROTIME in the last 168 hours. ------------------------------------------------------------------------------------------------------------------- No results for input(s): DDIMER in the last 72 hours. -------------------------------------------------------------------------------------------------------------------  Cardiac Enzymes Recent Labs  Lab 10/23/17 0538  TROPONINI <0.03   ------------------------------------------------------------------------------------------------------------------ Invalid input(s): POCBNP  ---------------------------------------------------------------------------------------------------------------  Urinalysis    Component Value Date/Time   COLORURINE YELLOW (A) 10/17/2017 0901   APPEARANCEUR HAZY (A) 10/17/2017 0901   LABSPEC 1.025 10/17/2017 0901   PHURINE 6.0 10/17/2017 0901   GLUCOSEU NEGATIVE 10/17/2017 0901   HGBUR  NEGATIVE 10/17/2017 0901   BILIRUBINUR SMALL (A) 10/17/2017 0901   KETONESUR 5 (A) 10/17/2017 0901   PROTEINUR 30 (A) 10/17/2017 0901   NITRITE NEGATIVE 10/17/2017 0901   LEUKOCYTESUR NEGATIVE 10/17/2017 0901     RADIOLOGY: Dg Chest Port 1 View  Result Date: 10/23/2017 CLINICAL DATA:  Shortness of breath EXAM: PORTABLE CHEST 1 VIEW COMPARISON:  Chest CT 09/20/2017 FINDINGS: Extensive reticular opacities throughout the right lung are compatible with the findings of the chest CT 09/20/2017, but have progressed since that time. The left lung remains relatively clear. Right chest wall Port-A-Cath tip is at the cavoatrial junction.Small right pleural effusion. IMPRESSION: Chronic right lung reticular opacities, which have progressed compared to the radiograph 09/15/2017. Small right pleural effusion. Electronically Signed   By: Ulyses Jarred M.D.   On: 10/23/2017 06:14    EKG: Orders placed or performed during the hospital encounter of 10/23/17  . ED EKG  . ED EKG    IMPRESSION AND PLAN: Patient is 62 year old with adenocarcinoma of the lung presenting with shortness of breath  1.  Acute respiratory  failure Check procalcitonin level, check a BNP level Due to possibility of pneumonia we will treat with broad-spectrum antibiotic  2.  Sepsis based on tachycardia, leukocytosis, elevated respiratory rate Continue antibiotics as above  3.  Adenocarcinoma based on CT scan in September seems to have had lymphangitic spread gnosis poor we will ask her oncologist to see  4.  Anxiety continue home medication  5.  Miscellaneous Lovenox for DVT prophylaxis  All the records are reviewed and case discussed with ED provider. Management plans discussed with the patient, family and they are in agreement.  CODE STATUS: Code Status History    Date Active Date Inactive Code Status Order ID Comments User Context   06/21/2016 1751 06/23/2016 1653 Full Code [id number]  Nicholes Mango, MD Inpatient        TOTAL TIME TAKING CARE OF THIS PATIENT: 55 minutes.    Dustin Flock M.D on 10/23/2017 at 7:54 AM  Between 7am to 6pm - Pager - [phone]  After 6pm go to [email or web address] - Proofreader  [facility] Office  [phone]  CC: Primary care physician; Mar Daring, PA-C

## 2017-10-23 NOTE — ED Notes (Addendum)
O2 4L placed

## 2017-10-23 NOTE — Consult Note (Signed)
Hematology/Oncology Consult note [facility] [phone] [phone]  Patient Care Team: Rubye Beach as PCP - General (Family Medicine) Allyne Gee, MD as Referring Physician (Internal Medicine)   Name of the patient: Nyhla Blair  [id number]  03/01/1955    Reason for consult: h/o Stage IV lung cancer   Requesting physician: Dr. 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  Her last chemotherapy was gemcitabine about 7 weeks ago.  She was recently admitted to the hospital for acute hypoxic respiratory failure probably secondary to progressive malignancy and healthcare associated pneumonia.  She was seen as an outpatient by Dr. Rogue Bussing and plan was to repeat CT chest abdomen and pelvis as well as MRI brain on 10/27/2017 and further discussion of goals of care.  Patient presented to the hospital with worsening shortness of breath and low oxygen saturation in the 60s.  She was placed on BiPAP and transferred to ICU.  Currently patient is on high flow oxygen.  She requested me to involve her family members during our conversation today.  Patient states she is getting weaker and would like to have more resources at home to get stronger.  She is overwhelmed with acute events in the last 12 hours.  She reports feeling fatigued and short of breath but overall feeling better since admission  ECOG PS- 2-3  Pain scale- 0   Review of systems- Review of Systems  Constitutional: Positive for malaise/fatigue. Negative for chills, fever and weight loss.  HENT: Negative for congestion, ear discharge and nosebleeds.   Eyes: Negative for blurred vision.  Respiratory: Positive for shortness of breath. Negative for cough, hemoptysis, sputum production  and wheezing.   Cardiovascular: Negative for chest pain, palpitations, orthopnea and claudication.  Gastrointestinal: Negative for abdominal pain, blood in stool, constipation, diarrhea, heartburn, melena, nausea and vomiting.  Genitourinary: Negative for dysuria, flank pain, frequency, hematuria and urgency.  Musculoskeletal: Negative for back pain, joint pain and myalgias.  Skin: Negative for rash.  Neurological: Negative for dizziness, tingling, focal weakness, seizures, weakness and headaches.  Endo/Heme/Allergies: Does not bruise/bleed easily.  Psychiatric/Behavioral: Negative for depression and suicidal ideas. The patient does not have insomnia.     No Known Allergies  Patient Active Problem List   Diagnosis Date Noted  . Acute respiratory failure ([place]) 10/23/2017  . 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 Joint pain following chemotherapy 02/28/2017  . Mucositis due to chemotherapy 02/28/2017  . Cancer associated pain 02/28/2017  . Leg pain, bilateral 12/01/2016  . Seborrheic keratosis 11/18/2016  . Cerebral edema ([place]) 06/21/2016  . Counseling regarding goals of care 01/28/2016  . Brain metastasis ([place]) 01/07/2016  . Malignant neoplasm of upper lobe, unspecified bronchus or lung ([place]) 10/09/2015  . Primary cancer of right lower lobe of lung ([place]) 10/08/2015  . Encounter for antineoplastic chemotherapy 06/24/2015  . Adenopathy   . 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 Cardiac enlargement 01/03/2015  . SOB (shortness of breath) 01/02/2015  . History of tobacco abuse 12/20/2014  . Personal history of nicotine dependence 12/20/2014  . History of cervical cancer 12/11/2014  . Tobacco abuse counseling 12/26/2012     Past Medical History:  Diagnosis Date  . Anxiety   . Cancer of right lung ([place]) 02/13/2015, 01/13/17  .  Depression   . Depression with anxiety    Well controlled with Wellbutrin and Buspar  . Pneumonia       Past Surgical History:  Procedure Laterality Date  . ECTOPIC PREGNANCY SURGERY  1988  . ELECTROMAGNETIC NAVIGATION BROCHOSCOPY N/A 02/10/2015   Procedure: ELECTROMAGNETIC NAVIGATION BRONCHOSCOPY;  Surgeon: Flora Lipps, MD;  Location: [facility] ORS;  Service: Cardiopulmonary;  Laterality: N/A;  . ENDOBRONCHIAL ULTRASOUND N/A 02/10/2015   Procedure: ENDOBRONCHIAL ULTRASOUND;  Surgeon: Flora Lipps, MD;  Location: [facility] ORS;  Service: Cardiopulmonary;  Laterality: N/A;  . PERIPHERAL VASCULAR CATHETERIZATION N/A 03/05/2015   Procedure: Glori Luis Cath Insertion;  Surgeon: Katha Cabal, MD;  Location: [facility];  Service: Cardiovascular;  Laterality: N/A;    Social History   Socioeconomic History  . Marital status: Married    Spouse name: Herbie Baltimore  . Number of children: 1  . Years of education: 30  . Highest education level: Not on file  Occupational History  . 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 Financial resource strain: Not on file  . Food insecurity:    Worry: Not on file    Inability: Not on file  . Transportation needs:    Medical: Not on file    Non-medical: Not on file  Tobacco Use  . Smoking status: Former Smoker    Packs/day: 0.25    Years: 30.00    Pack years: 7.50    Types: Cigarettes    Last attempt to quit: 02/06/2005    Years since quitting: 12.7  . Smokeless tobacco: Never Used  Substance and Sexual Activity  . Alcohol use: Yes    Comment: not drank any in several months  . Drug use: No  . Sexual activity: Yes    Partners: Male  Lifestyle  . Physical activity:    Days per week: Not on file    Minutes per session: Not on file  . Stress: Not on file  Relationships  . Social connections:    Talks on phone: Not on file    Gets together: Not on file    Attends religious service: Not on file    Active member of club or organization: Not on file    Attends meetings of clubs or organizations: Not on file     Relationship status: Not on file  . Intimate partner violence:    Fear of current or ex partner: Not on file    Emotionally abused: Not on file    Physically abused: Not on file    Forced sexual activity: Not on file  Other Topics Concern  . Not on file  Social History Narrative   [name] was born in [place], [place]. She moved with her family with [facility] and lived there for 13 years. She recently moved to [place] approximately 1 year ago. She lives with her husband of 28 years. They have an adult daughter who lives in [place]. 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 She is enjoying driving around and getting to know [place]. She and her husband enjoy cooking and baking. They also enjoy hiking when the weather permits.     Family History  Problem Relation Age of Onset  . Cancer Father        Prostate Cancer  . Hypertension Father   . Stroke Father   . Hypertension Brother      Current Facility-Administered Medications:  .  0.9 %  sodium chloride infusion, , Intravenous, Continuous, Dustin Flock, MD, Last Rate: 75 mL/hr at 10/23/17 1600 .  acetaminophen (TYLENOL) tablet 650 mg, 650 mg, Oral, Q6H PRN **OR** acetaminophen (TYLENOL) suppository 650 mg, 650 mg, Rectal, Q6H PRN, Dustin Flock, MD .  azithromycin (ZITHROMAX) 500 mg in sodium chloride 0.9 % 250 mL IVPB, 500 mg, Intravenous, Q24H, Gregor Hams, MD, Stopped at 10/23/17 0715 .  budesonide (PULMICORT) nebulizer solution 0.25 mg, 0.25 mg, Nebulization, BID, Dustin Flock, MD .  ceFEPIme (MAXIPIME) 2 g in sodium chloride 0.9 % 100 mL IVPB, 2 g, Intravenous, Q8H, Dustin Flock, MD, Stopped at 10/23/17 1409 .  enoxaparin (LOVENOX) injection 30 mg, 30 mg, Subcutaneous, Q24H, Dustin Flock, MD .  Derrill Memo ON 10/24/2017] Influenza vac split quadrivalent PF (FLUARIX) injection 0.5 mL, 0.5 mL, Intramuscular, Tomorrow-1000, Vernard Gambles L, MD .  ipratropium-albuterol (DUONEB) 0.5-2.5 (3)  MG/3ML nebulizer solution 3 mL, 3 mL, Nebulization, Q4H, Dustin Flock, MD, 3 mL at 10/23/17 1609 .  LORazepam (ATIVAN) tablet 1 mg, 1 mg, Oral, Q6H PRN, Tyler Pita, MD .  methylPREDNISolone sodium succinate (SOLU-MEDROL) 125 mg/2 mL injection 60 mg, 60 mg, Intravenous, Q6H, Dustin Flock, MD, 60 mg at 10/23/17 1337 .  ondansetron (ZOFRAN) tablet 4 mg, 4 mg, Oral, Q6H PRN **OR** ondansetron (ZOFRAN) injection 4 mg, 4 mg, Intravenous, Q6H PRN, Dustin Flock, MD .  oxyCODONE (Oxy IR/ROXICODONE) immediate release tablet 10 mg, 10 mg, Oral, Q4H PRN, Tyler Pita, MD, 10 mg at 10/23/17 1412 .  potassium chloride SA (K-DUR,KLOR-CON) CR tablet 20 mEq, 20 mEq, Oral, BID, Dustin Flock, MD, 20 mEq at 10/23/17 1004 .  vancomycin (VANCOCIN) IVPB 750 mg/150 ml premix, 750 mg, Intravenous, Q12H, Dustin Flock, MD, Last Rate: 150 mL/hr at 10/23/17 1706, 750 mg at 10/23/17 1706  Facility-Administered Medications Ordered in Other Encounters:  .  0.9 %  sodium chloride infusion, , Intravenous, Continuous, Burns, Jennifer E, NP .  0.9 %  sodium chloride infusion, , Intravenous, Continuous, Burns, Jennifer E, NP .  dexamethasone (DECADRON) injection 10 mg, 10 mg, Intravenous, Once, Burns, Jennifer E, NP .  heparin lock flush 100 unit/mL, 500 Units, Intravenous, Once, Cammie Sickle, MD   Physical exam:  Vitals:   10/23/17 1400 10/23/17 1500 10/23/17 1600 10/23/17 1700  BP: (!) 87/61 (!) 113/55 129/82 100/60  Pulse: 88 89 (!) 102 (!) 101  Resp: (!) 24 (!) 24 (!) 23 (!) 26  Temp:   98.9 F (37.2 C)   TempSrc:   Oral   SpO2: 100% 96% 98% 99%  Weight:      Height:       Physical Exam  Constitutional: She is oriented to person, place, and time.  Patient appears thin and frail and is on high flow oxygen.  Does not appear to be in any acute distress  HENT:  Head: Normocephalic and atraumatic.  Eyes: Pupils are equal, round, and reactive to light. EOM are normal.  Neck: Normal  range of motion.  Cardiovascular: Normal rate, regular rhythm and normal heart sounds.  Pulmonary/Chest: Breath sounds normal.  Mildly increased effort of breathing  Abdominal: Soft. Bowel sounds are normal.  Neurological: She is alert and oriented to person, place, and time.  Skin: Skin is warm and dry.       CMP Latest Ref Rng & Units 10/23/2017  Glucose 70 - 99 mg/dL 139(H)  BUN 8 - 23 mg/dL 11  Creatinine 0.44 - 1.00 mg/dL 0.63  Sodium  135 - 145 mmol/L 136  Potassium 3.5 - 5.1 mmol/L 3.3(L)  Chloride 98 - 111 mmol/L 99  CO2 22 - 32 mmol/L 24  Calcium 8.9 - 10.3 mg/dL 8.8(L)  Total Protein 6.5 - 8.1 g/dL -  Total Bilirubin 0.3 - 1.2 mg/dL -  Alkaline Phos 38 - 126 U/L -  AST 15 - 41 U/L -  ALT 0 - 44 U/L -   CBC Latest Ref Rng & Units 10/23/2017  WBC 4.0 - 10.5 K/uL 14.0(H)  Hemoglobin 12.0 - 15.0 g/dL 9.5(L)  Hematocrit 36.0 - 46.0 % 30.3(L)  Platelets 150 - 400 K/uL 362    @IMAGES @  Dg Chest Port 1 View  Result Date: 10/23/2017 CLINICAL DATA:  Shortness of breath EXAM: PORTABLE CHEST 1 VIEW COMPARISON:  Chest CT 09/20/2017 FINDINGS: Extensive reticular opacities throughout the right lung are compatible with the findings of the chest CT 09/20/2017, but have progressed since that time. The left lung remains relatively clear. Right chest wall Port-A-Cath tip is at the cavoatrial junction.Small right pleural effusion. IMPRESSION: Chronic right lung reticular opacities, which have progressed compared to the radiograph 09/15/2017. Small right pleural effusion. Electronically Signed   By: Ulyses Jarred M.D.   On: 10/23/2017 06:14    Assessment and plan- Patient is a 62 y.o. female with history of stage IV lung cancer and progression on multiple lines of chemotherapy.  She last received gemcitabine about 6 to 7 weeks ago and is currently admitted for acute hypoxic respiratory failure probably secondary to progressive malignancy versus healthcare associated pneumonia  1.   Before entering the patient's room I spoke with Dr. Patsey Berthold from critical care who stated that patient is accepting of home hospice.  I discussed with the family the differences between home-based palliative care and hospice.  Discussed that hospice will focus on her comfort and quality of life but that would mean foregoing aggressive treatments and recurrent hospitalizations as well.  Patient and her family state to me that they are not willing to consider hospice at this time but willing to consider home-based palliative care.  They are also willing to speak to our palliative care service to learn more about their future options.  2.  Patient was supposed to get a CT chest abdomen and pelvis as well as MRI of the brain on 10/27/2017.  If she is medically stable I will recommend getting that at this time.  If scans show disease progression, that may help family coming to more definitive decisions and goals of care.  Dr. Rogue Bussing will have further goals of care conversation with patient and family tomorrow.  3.  Chest x-ray shows progression of reticular opacities in the lung as compared to prior x-ray in September 2019 likely indicating progressive malignancy.  However CT chest may help clarify this further   Total face to face encounter time for this patient visit was 30 min. >50% of the time was  spent in counseling and coordination of care.    Thank you for this kind referral and the opportunity to participate in the care of this patient   Visit Diagnosis 1. Sepsis, due to unspecified organism, unspecified whether acute organ dysfunction present [facility])     Dr. Randa Evens, MD, MPH [facility] at [facility] [phone] 10/23/2017 6:27 PM

## 2017-10-23 NOTE — Consult Note (Signed)
Reason for Consult: Assistance with management of acute on chronic respiratory failure with hypoxemia Referring Physician: Patel,S.  Deanna Blair is an 62 y.o. female.  HPI:   The patient is a 62 year old female,  former smoker, with a history of adenocarcinoma of the lung recently treated in [place] approximately 10 days ago for presumed pneumonia. The patient felt well for a few days but over the last 2 to 3 days as noted worsening dyspnea and cough productive of copious foamy sputum. The sputum is clear and foamy with no purulence to it. She has not reported any fevers or chills. Patient is noted to have stage IV adenocarcinoma has been treated also for brain metastasis with gamma knife radiation. She has been given several attempts at chemotherapy last chemotherapy was approximately six weeks ago which she tolerated very poorly. 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 The CT scan in August was performed at the [facility] and September scan was performed here. Chest x-ray today shows increasing opacity particularly on the right more of a ground glass appearance and less of a infectious appearance. This is concerning for ongoing progression of her primary process. Initially she was requiring BiPAP and she was admitted to the ICU, she was tolerating this very poorly and was actually getting very anxious. She has been transition to high flow nasal cannula which she is tolerating better.  I have discussed my concerns with regards to her x-ray findings and after review of her most recent CT scan of the chest and I have discussed with her that I believe that this is progression of her disease. The patient has made it very clear that she is to be DNR/DNI. She also wants to concentrate on getting home with hospice.  Because of her dyspnea and anxiety a full review of systems is  not possible however she does not endorse any change in her chronic pain associated with her malignancy. She has not had any hemoptysis. Sputum as noted has not been purulent, it has been "foamy".   Past Medical History:  Diagnosis Date  . Anxiety   . Cancer of right lung ([place]) 02/13/2015, 01/13/17  . Depression   . Depression with anxiety    Well controlled with Wellbutrin and Buspar  . Pneumonia     Past Surgical History:  Procedure Laterality Date  . ECTOPIC PREGNANCY SURGERY  1988  . ELECTROMAGNETIC NAVIGATION BROCHOSCOPY N/A 02/10/2015   Procedure: ELECTROMAGNETIC NAVIGATION BRONCHOSCOPY;  Surgeon: Flora Lipps, MD;  Location: [facility] ORS;  Service: Cardiopulmonary;  Laterality: N/A;  . ENDOBRONCHIAL ULTRASOUND N/A 02/10/2015   Procedure: ENDOBRONCHIAL ULTRASOUND;  Surgeon: Flora Lipps, MD;  Location: [facility] ORS;  Service: Cardiopulmonary;  Laterality: N/A;  . PERIPHERAL VASCULAR CATHETERIZATION N/A 03/05/2015   Procedure: Glori Luis Cath Insertion;  Surgeon: Katha Cabal, MD;  Location: [facility];  Service: Cardiovascular;  Laterality: N/A;    Family History  Problem Relation Age of Onset  . Cancer Father        Prostate Cancer  . Hypertension Father   . Stroke Father   . Hypertension Brother     Social History:  reports that she quit smoking about 12 years ago. Her smoking use included cigarettes. She has a 7.50 pack-year smoking history. She has never used smokeless tobacco. She reports that she drinks alcohol. She reports that she does not use drugs.  Allergies: No Known Allergies  Medications: I have reviewed the patient's current medications.  Results for orders placed or performed during the hospital encounter of 10/23/17 (from the past 48 hour(s))  CBC with Differential     Status: Abnormal   Collection Time: 10/23/17  5:38 AM  Result Value Ref Range   WBC 14.0 (H) 4.0 - 10.5 K/uL   RBC 2.91 (L) 3.87 - 5.11 MIL/uL   Hemoglobin 9.5 (L) 12.0 - 15.0 g/dL   HCT  30.3 (L) 36.0 - 46.0 %   MCV 104.1 (H) 80.0 - 100.0 fL   MCH 32.6 26.0 - 34.0 pg   MCHC 31.4 30.0 - 36.0 g/dL   RDW 19.2 (H) 11.5 - 15.5 %   Platelets 362 150 - 400 K/uL   nRBC 0.0 0.0 - 0.2 %   Neutrophils Relative % 81 %   Neutro Abs 11.4 (H) 1.7 - 7.7 K/uL   Lymphocytes Relative 9 %   Lymphs Abs 1.3 0.7 - 4.0 K/uL   Monocytes Relative 6 %   Monocytes Absolute 0.8 0.1 - 1.0 K/uL   Eosinophils Relative 3 %   Eosinophils Absolute 0.5 0.0 - 0.5 K/uL   Basophils Relative 0 %   Basophils Absolute 0.0 0.0 - 0.1 K/uL   Immature Granulocytes 1 %   Abs Immature Granulocytes 0.09 (H) 0.00 - 0.07 K/uL    Comment: Performed at [facility], [address] [place], [place] [address]  Basic metabolic panel     Status: Abnormal   Collection Time: 10/23/17  5:38 AM  Result Value Ref Range   Sodium 136 135 - 145 mmol/L   Potassium 3.3 (L) 3.5 - 5.1 mmol/L   Chloride 99 98 - 111 mmol/L   CO2 24 22 - 32 mmol/L   Glucose, Bld 139 (H) 70 - 99 mg/dL   BUN 11 8 - 23 mg/dL   Creatinine, Ser 0.63 0.44 - 1.00 mg/dL   Calcium 8.8 (L) 8.9 - 10.3 mg/dL   GFR calc non Af Amer >60 >60 mL/min   GFR calc Af Amer >60 >60 mL/min    Comment: (NOTE) The eGFR has been calculated using the CKD EPI equation. This calculation has not been validated in all clinical situations. eGFR's persistently <60 mL/min signify possible Chronic Kidney Disease.    Anion gap 13 5 - 15    Comment: Performed at [facility], [address] [place], [place] [address]  Troponin I     Status: None   Collection Time: 10/23/17  5:38 AM  Result Value Ref Range   Troponin I <0.03 <0.03 ng/mL    Comment: Performed at [facility], [address] [place], [place] [address]  Lactic acid, plasma     Status: None   Collection Time: 10/23/17  5:38 AM  Result Value Ref Range   Lactic Acid, Venous 1.5 0.5 - 1.9 mmol/L    Comment: Performed at [facility], [address] [place], [place]  [address]  Blood Culture (routine x 2)     Status: None (Preliminary result)   Collection Time: 10/23/17  5:45 AM  Result Value Ref Range   Specimen Description BLOOD BLOOD LEFT FOREARM    Special Requests      BOTTLES DRAWN AEROBIC AND ANAEROBIC Blood Culture results may not be optimal due to an inadequate volume of blood received in culture bottles   Culture      NO GROWTH < 12 HOURS Performed at [facility], [address] [place], [place] [address]    Report Status PENDING   Blood Culture (routine x 2)     Status: None (Preliminary result)   Collection Time: 10/23/17  5:45 AM  Result Value Ref Range   Specimen Description BLOOD BLOOD RIGHT FOREARM    Special Requests      BOTTLES DRAWN AEROBIC AND ANAEROBIC Blood Culture adequate volume   Culture      NO GROWTH < 12 HOURS Performed at [facility], [address] [place], [place] [address]    Report Status PENDING   Blood gas, arterial     Status: Abnormal   Collection Time: 10/23/17  7:59 AM  Result Value Ref Range   FIO2 0.50    Delivery systems BILEVEL POSITIVE AIRWAY PRESSURE    Inspiratory PAP 10    Expiratory PAP 5    pH, Arterial 7.45 7.350 - 7.450   pCO2 arterial 32 32.0 - 48.0 mmHg   pO2, Arterial 50 (L) 83.0 - 108.0 mmHg   Bicarbonate 22.2 20.0 - 28.0 mmol/L   Acid-base deficit 1.4 0.0 - 2.0 mmol/L   O2 Saturation 86.9 %   Patient temperature 37.0    Collection site RIGHT RADIAL    Sample type ARTERIAL DRAW    Allens test (pass/fail) PASS PASS    Comment: Performed at [facility], [address] [place], [place] [address]  Glucose, capillary     Status: Abnormal   Collection Time: 10/23/17  9:45 AM  Result Value Ref Range   Glucose-Capillary 137 (H) 70 - 99 mg/dL  MRSA PCR Screening     Status: None   Collection Time: 10/23/17  9:47 AM  Result Value Ref Range   MRSA by PCR NEGATIVE NEGATIVE    Comment:        The GeneXpert MRSA Assay (FDA approved for NASAL  specimens only), is one component of a comprehensive MRSA colonization surveillance program. It is not intended to diagnose MRSA infection nor to guide or monitor treatment for MRSA infections. Performed at [facility], [address] [place], [place] [address]   Lactic acid, plasma     Status: None   Collection Time: 10/23/17 10:00 AM  Result Value Ref Range   Lactic Acid, Venous 1.7 0.5 - 1.9 mmol/L    Comment: Performed at [facility], [address] [place],  [address]  Procalcitonin - Baseline     Status: None   Collection Time: 10/23/17 10:00 AM  Result Value Ref Range   Procalcitonin 0.18 ng/mL    Comment:        Interpretation: PCT (Procalcitonin) <= 0.5 ng/mL: Systemic infection (sepsis) is not likely. Local bacterial infection is possible. (NOTE)       Sepsis PCT Algorithm           Lower Respiratory Tract                                      Infection PCT Algorithm    ----------------------------     ----------------------------         PCT < 0.25 ng/mL                PCT < 0.10 ng/mL         Strongly encourage             Strongly discourage   discontinuation of antibiotics  initiation of antibiotics    ----------------------------     -----------------------------       PCT 0.25 - 0.50 ng/mL            PCT 0.10 - 0.25 ng/mL               OR       >80% decrease in PCT            Discourage initiation of                                            antibiotics      Encourage discontinuation           of antibiotics    ----------------------------     -----------------------------         PCT >= 0.50 ng/mL              PCT 0.26 - 0.50 ng/mL               AND        <80% decrease in PCT             Encourage initiation of                                             antibiotics       Encourage continuation           of antibiotics    ----------------------------     -----------------------------        PCT >= 0.50 ng/mL                   PCT > 0.50 ng/mL               AND         increase in PCT                  Strongly encourage                                      initiation of antibiotics    Strongly encourage escalation           of antibiotics                                     -----------------------------                                           PCT <= 0.25 ng/mL                                                 OR                                        >  80% decrease in PCT                                     Discontinue / Do not initiate                                             antibiotics Performed at [facility], [address] [place], [place] [address]   Brain natriuretic peptide     Status: Abnormal   Collection Time: 10/23/17 10:00 AM  Result Value Ref Range   B Natriuretic Peptide 159.0 (H) 0.0 - 100.0 pg/mL    Comment: Performed at [facility], [address] [place], [place] [address]    Dg Chest Port 1 View  Result Date: 10/23/2017 CLINICAL DATA:  Shortness of breath EXAM: PORTABLE CHEST 1 VIEW COMPARISON:  Chest CT 09/20/2017 FINDINGS: Extensive reticular opacities throughout the right lung are compatible with the findings of the chest CT 09/20/2017, but have progressed since that time. The left lung remains relatively clear. Right chest wall Port-A-Cath tip is at the cavoatrial junction.Small right pleural effusion. IMPRESSION: Chronic right lung reticular opacities, which have progressed compared to the radiograph 09/15/2017. Small right pleural effusion. Electronically Signed   By: Ulyses Jarred M.D.   On: 10/23/2017 06:14    Review of Systems  Unable to perform ROS: Severe respiratory distress     Blood pressure (!) 113/55, pulse 89, temperature 98.8 F (37.1 C), temperature source Oral, resp. rate (!) 24, height 5' 3"  (1.6 m), weight 49 kg, last menstrual period 06/16/2000, SpO2 96 %. Physical Exam  Nursing note and vitals reviewed. Constitutional: She is  oriented to person, place, and time. She appears distressed.  Very thin woman, almost cachectic.  HENT:  Head: Normocephalic and atraumatic.  Nose: Nose normal.  Mouth/Throat: Oropharynx is clear and moist. No oropharyngeal exudate.  Has alopecia related to chemotherapy and radiation therapy.  Eyes: Pupils are equal, round, and reactive to light. Conjunctivae are normal. No scleral icterus.  Neck: Neck supple. No JVD present. No tracheal deviation present. No thyromegaly present.  Cardiovascular: Normal rate, regular rhythm, normal heart sounds and intact distal pulses.  No murmur heard. Respiratory: She is in respiratory distress. She has no wheezes. She has no rales. She exhibits no tenderness.  Crackles throughout the right chest.  GI: Soft. Bowel sounds are normal. She exhibits no distension. There is no tenderness.  Musculoskeletal: She exhibits no edema.  Lymphadenopathy:    She has no cervical adenopathy.  Neurological: She is alert and oriented to person, place, and time.  Skin: Skin is warm and dry. She is not diaphoretic.  Psychiatric: Judgment and thought content normal.  Very anxious.    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 She was recently treated for "pneumonia" 10 days ago but has continued to expect rate clear "foamy" quality sputum. This is the description that usually occurs with bronchorrhea due to adenocarcinoma. Recommend to treat for palliation of symptoms. She did not tolerate BiPAP well and was switched to high flow 02 which she is tolerating better. Her options are very limited. The patient  is aware of this. She is focused on trying to get hospice services so she can go home. She is DNR/DNI.  2) Multifocal adenocarcinoma stage IV. As noted above recommend  palliation of symptoms. Hospice would be appropriate.  3) Anxiety related to respiratory distress, this has been relieved by switching her to high flow O2, will also treat with low dose lorazepam as needed.  4) Recommend discontinuing antibiotics as I have very low index of suspicion for an infectious process in this patient at present.  I discussed the case with Dr. Posey Pronto. I have also discussed the case with Dr. Janese Banks of oncology.  I had discussion with the patient's husband with regards to my impressions above.  Thank you for allowing [facility] to participate in this patient's care     Vernard Gambles MD [place] 10/23/2017, 4:03 PM    pulmonary/critical

## 2017-10-23 NOTE — Progress Notes (Signed)
Advanced care plan.  Purpose of the Encounter: CODE STATUS  Parties in Attendance: Patient herself and husband  Patient's Decision Capacity: Intact  Subjective/Patient's story: Patient is 62 year old with adenocarcinoma the lung presenting with respiratory distress   Objective/Medical story  I discussed with the patient regarding her desires for cardiac and pulmonary resuscitation  Goals of care determination:  Patient states that she would not want to be resuscitated from a cardiac or pulmonary standpoint   CODE STATUS: DNR   Time spent discussing advanced care planning: 16 minutes

## 2017-10-24 ENCOUNTER — Inpatient Hospital Stay: Payer: BLUE CROSS/BLUE SHIELD

## 2017-10-24 DIAGNOSIS — Z9981 Dependence on supplemental oxygen: Secondary | ICD-10-CM

## 2017-10-24 DIAGNOSIS — J9620 Acute and chronic respiratory failure, unspecified whether with hypoxia or hypercapnia: Secondary | ICD-10-CM

## 2017-10-24 DIAGNOSIS — Z66 Do not resuscitate: Secondary | ICD-10-CM

## 2017-10-24 DIAGNOSIS — Z515 Encounter for palliative care: Secondary | ICD-10-CM

## 2017-10-24 LAB — CBC
HEMATOCRIT: 25 % — AB (ref 36.0–46.0)
Hemoglobin: 8 g/dL — ABNORMAL LOW (ref 12.0–15.0)
MCH: 33.3 pg (ref 26.0–34.0)
MCHC: 32 g/dL (ref 30.0–36.0)
MCV: 104.2 fL — ABNORMAL HIGH (ref 80.0–100.0)
NRBC: 0 % (ref 0.0–0.2)
PLATELETS: 309 10*3/uL (ref 150–400)
RBC: 2.4 MIL/uL — ABNORMAL LOW (ref 3.87–5.11)
RDW: 19.1 % — AB (ref 11.5–15.5)
WBC: 14.2 10*3/uL — AB (ref 4.0–10.5)

## 2017-10-24 LAB — BASIC METABOLIC PANEL
Anion gap: 8 (ref 5–15)
BUN: 10 mg/dL (ref 8–23)
CALCIUM: 8.1 mg/dL — AB (ref 8.9–10.3)
CHLORIDE: 106 mmol/L (ref 98–111)
CO2: 23 mmol/L (ref 22–32)
Creatinine, Ser: 0.55 mg/dL (ref 0.44–1.00)
Glucose, Bld: 176 mg/dL — ABNORMAL HIGH (ref 70–99)
Potassium: 3.6 mmol/L (ref 3.5–5.1)
SODIUM: 137 mmol/L (ref 135–145)

## 2017-10-24 LAB — PHOSPHORUS: PHOSPHORUS: 3.3 mg/dL (ref 2.5–4.6)

## 2017-10-24 LAB — MAGNESIUM: MAGNESIUM: 1.5 mg/dL — AB (ref 1.7–2.4)

## 2017-10-24 MED ORDER — POLYETHYLENE GLYCOL 3350 17 G PO PACK
17.0000 g | PACK | Freq: Every day | ORAL | Status: DC
  Administered 2017-10-24 – 2017-10-28 (×4): 17 g via ORAL
  Filled 2017-10-24 (×4): qty 1

## 2017-10-24 MED ORDER — MAGNESIUM SULFATE 4 GM/100ML IV SOLN
4.0000 g | Freq: Once | INTRAVENOUS | Status: AC
  Administered 2017-10-24: 4 g via INTRAVENOUS
  Filled 2017-10-24: qty 100

## 2017-10-24 MED ORDER — DOCUSATE SODIUM 100 MG PO CAPS
100.0000 mg | ORAL_CAPSULE | Freq: Two times a day (BID) | ORAL | Status: DC
  Administered 2017-10-24 – 2017-10-25 (×2): 100 mg via ORAL
  Filled 2017-10-24 (×2): qty 1

## 2017-10-24 MED ORDER — ENOXAPARIN SODIUM 40 MG/0.4ML ~~LOC~~ SOLN
40.0000 mg | SUBCUTANEOUS | Status: DC
  Administered 2017-10-24 – 2017-10-27 (×4): 40 mg via SUBCUTANEOUS
  Filled 2017-10-24 (×4): qty 0.4

## 2017-10-24 MED ORDER — OXYCODONE HCL 5 MG PO TABS
10.0000 mg | ORAL_TABLET | ORAL | Status: AC | PRN
  Administered 2017-10-24 – 2017-10-25 (×2): 10 mg via ORAL
  Filled 2017-10-24 (×2): qty 2

## 2017-10-24 MED ORDER — DOCUSATE SODIUM 100 MG PO CAPS: 100.0000 mg | ORAL_CAPSULE | Freq: Every morning | ORAL | Status: DC

## 2017-10-24 MED ORDER — FAMOTIDINE 20 MG PO TABS
20.0000 mg | ORAL_TABLET | Freq: Two times a day (BID) | ORAL | Status: DC
  Administered 2017-10-24 – 2017-10-28 (×9): 20 mg via ORAL
  Filled 2017-10-24 (×9): qty 1

## 2017-10-24 MED ORDER — LORAZEPAM 0.5 MG PO TABS
0.5000 mg | ORAL_TABLET | Freq: Two times a day (BID) | ORAL | Status: DC
  Administered 2017-10-24 – 2017-10-25 (×4): 0.5 mg via ORAL
  Filled 2017-10-24 (×5): qty 1

## 2017-10-24 MED ORDER — SODIUM CHLORIDE 0.9 % IV SOLN
500.0000 mg | INTRAVENOUS | Status: AC
  Administered 2017-10-25: 500 mg via INTRAVENOUS
  Filled 2017-10-24: qty 500

## 2017-10-24 MED ORDER — FENTANYL 25 MCG/HR TD PT72
25.0000 ug | MEDICATED_PATCH | TRANSDERMAL | Status: DC
  Administered 2017-10-24 – 2017-10-27 (×2): 25 ug via TRANSDERMAL
  Filled 2017-10-24 (×2): qty 1

## 2017-10-24 MED ORDER — POTASSIUM PHOSPHATE MONOBASIC 500 MG PO TABS
500.0000 mg | ORAL_TABLET | ORAL | Status: AC
  Administered 2017-10-24 (×2): 500 mg via ORAL
  Filled 2017-10-24 (×2): qty 1

## 2017-10-24 NOTE — Progress Notes (Signed)
New referral for [facility] services at home received from [facility]. Patient is currently on HiFlo oxygen with requirements to high for home use. Per chart note review, plan is to wean oxygen as tolerated to a lower flow rate to accommodate home oxygen ability. Writer to follow up with patient and family. [place] aware. Patient information faxed to referral. Flo Shanks RN, BSN, [facility] liaison

## 2017-10-24 NOTE — Progress Notes (Signed)
Case discussed with Dr. Rogue Bussing, who also met with patient this afternoon.  Made a follow-up visit to see patient.  Her family was not present.  Patient says she recognizes that treatment options are limited.  She says she would now like to pursue hospice care in the home.  Will consult care management for hospice referral.  Would recommend continued O2 weaning to prepare her for eventual discharge.  Plan 1.  Care management consult  2.  Hospice referral 3.  Continue supportive care

## 2017-10-24 NOTE — Care Management Note (Signed)
Case Management Note  Patient Details  Name: Fayth Trefry MRN: [id number] Date of Birth: May 02, 1955  Subjective/Objective:      Patient admitted to the ICU with acute respiratory failure, she has stage IV lung cancer.  Patient lives at home with her husband Herbie [place].  She has no equipment in the home other than a oxygen concentrator.  Palliative consulted and patient and family decided on Hospice at home.  Offered choice of agency and they choose [facility].   Patient is not ready to discharge at this time she is requiring HF O2.  Flo Shanks with Hospice notified that patient will want to discharge with Hospice at home.  I will cont to follow.                  Action/Plan:   Expected Discharge Date:  10/25/17               Expected Discharge Plan:  Home w Hospice Care  In-House Referral:  Hospice / Palliative Care  Discharge planning Services  CM Consult  Post Acute Care Choice:    Choice offered to:  Patient, Spouse  DME Arranged:    DME Agency:     HH Arranged:    [place] Agency:     Status of Service:  In process, will continue to follow  If discussed at Long Length of Stay Meetings, dates discussed:    Additional Comments:  Shelbie Hutching, RN 10/24/2017, 3:33 PM

## 2017-10-24 NOTE — Progress Notes (Addendum)
Name: Deanna Blair MRN: [id number] DOB: 1955/07/08     CONSULTATION DATE: 10/23/2017  Subjective & Objective: Tolerating HFNC 60%  PAST MEDICAL HISTORY :   has a past medical history of Anxiety, Cancer of right lung ([place]) (02/13/2015, 01/13/17), Depression, Depression with anxiety, and Pneumonia.  has a past surgical history that includes Ectopic pregnancy surgery (1988); Endobronchial ultrasound (N/A, 02/10/2015); Electormagnetic navigation bronchoscopy (N/A, 02/10/2015); and Cardiac catheterization (N/A, 03/05/2015). Prior to Admission medications   Medication Sig Start Date End Date Taking? Authorizing Provider  acetaminophen (TYLENOL) 500 MG tablet Take 1,000 mg by mouth every 4 (four) hours as needed for mild pain or moderate pain.    Yes [provider]  albuterol (PROVENTIL) (2.5 MG/3ML) 0.083% nebulizer solution Take 2.5 mg by nebulization every 6 (six) hours as needed for wheezing.  10/12/17  Yes [provider]  BEVESPI AEROSPHERE 9-4.8 MCG/ACT AERO Inhale 2 puffs into the lungs 2 (two) times daily. 10/12/17  Yes [provider]  docusate sodium (COLACE) 100 MG capsule Take 100 mg by mouth 2 (two) times daily.   Yes [provider]  lidocaine-prilocaine (EMLA) cream Apply 1 application topically as needed. 12/22/16  Yes Cammie Sickle, MD  LORazepam (ATIVAN) 0.5 MG tablet Take 1 tablet (0.5 mg total) by mouth 2 (two) times daily. As needed for anxiety/ nausea 08/24/17  Yes Cammie Sickle, MD  Oxycodone HCl 10 MG TABS Take 1 tablet (10 mg total) by mouth every 6 (six) hours as needed (moderate to severe pain). 09/21/17  Yes Cammie Sickle, MD  polyethylene glycol (MIRALAX / GLYCOLAX) packet Take 17 g by mouth daily as needed for mild constipation or moderate constipation.    Yes [provider]  VENTOLIN HFA 108 (90 Base) MCG/ACT inhaler Inhale 1 puff into the lungs every 6 (six) hours as needed for wheezing or shortness  of breath.  10/12/17  Yes [provider]  chlorpheniramine-HYDROcodone (TUSSIONEX) 10-8 MG/5ML SUER Take 5 mLs by mouth 2 (two) times daily. Patient not taking: Reported on 10/21/2017 09/21/17   Cammie Sickle, MD  dexamethasone (DECADRON) 4 MG tablet Take 1 tablet (4 mg total) by mouth 2 (two) times daily with a meal. Start the day prior to chemo; for 3 days. Patient not taking: Reported on 10/21/2017 02/14/17   Cammie Sickle, MD  fentaNYL (DURAGESIC - DOSED MCG/HR) 25 MCG/HR patch Place 1 patch (25 mcg total) onto the skin every 3 (three) days. Patient not taking: Reported on 10/21/2017 09/09/17   Cammie Sickle, MD  Fluticasone-Umeclidin-Vilant (TRELEGY ELLIPTA) 100-62.5-25 MCG/INH AEPB Inhale 1 puff into the lungs daily. Patient not taking: Reported on 10/21/2017 07/28/17   Allyne Gee, MD  ibuprofen (IBU) 400 MG tablet Take 1 tablet (400 mg total) by mouth 3 (three) times daily as needed. Patient not taking: Reported on 10/21/2017 07/11/17   Cammie Sickle, MD  ondansetron (ZOFRAN) 8 MG tablet Take 1 tablet (8 mg total) by mouth every 8 (eight) hours as needed for nausea or vomiting (start 3 days; after chemo). Patient not taking: Reported on 10/21/2017 08/31/17   Cammie Sickle, MD  potassium chloride SA (K-DUR,KLOR-CON) 20 MEQ tablet Take 1 tablet (20 mEq total) by mouth 2 (two) times daily. Patient not taking: Reported on 10/23/2017 09/12/17   Jacquelin Hawking, NP  predniSONE (DELTASONE) 10 MG tablet Take 1 tablet by mouth daily for 3 days and 0.5 tablet until gone. 10/21/17   Jacquelin Hawking,  NP  prochlorperazine (COMPAZINE) 10 MG tablet Take 1 tablet (10 mg total) by mouth every 6 (six) hours as needed for nausea or vomiting. Patient not taking: Reported on 10/21/2017 08/31/17   Cammie Sickle, MD   No Known Allergies  FAMILY HISTORY:  family history includes Cancer in her father; Hypertension in her brother and father; Stroke in her  father. SOCIAL HISTORY:  reports that she quit smoking about 12 years ago. Her smoking use included cigarettes. She has a 7.50 pack-year smoking history. She has never used smokeless tobacco. She reports that she drinks alcohol. She reports that she does not use drugs.  REVIEW OF SYSTEMS:   Unable to obtain due to critical illness   VITAL SIGNS: Temp:  [97.7 F (36.5 C)-98.1 F (36.7 C)] 97.9 F (36.6 C) (10/21 1500) Pulse Rate:  [70-103] 87 (10/21 1700) Resp:  [15-31] 18 (10/21 1700) BP: (97-136)/(55-91) 128/91 (10/21 0800) SpO2:  [89 %-100 %] 97 % (10/21 1700) FiO2 (%):  [69 %-70 %] 69 % (10/21 1500)  Physical Examination:  A + O x 3 and no focal neuro deficits On NFNC, no distress, BEAE and no rales S1 & S2 audible and no murmur Benign abdomen with normal peristalses No edema  ASSESSMENT / PLAN:  Acute on chronic respiratory failure tolerating HFNC 60%. DNR and awaiting hospice referral -Gentle diuresis as tolerated to improve lung compliance.  Metastatic adenocarcinoma of the lung s/p chemotherapy.  Infective etiology is less likely( Procal 0.18 and LA < 2) -Consider d/c  ABX if cultures remains -ve.  Hyperglycemia -Glycemic control  Anemia -Keep HB > 7 gm/dl  Depression and anxiety  Hypophosphatemia -Replete and monitor electrolytes.  DNR  DVT & GI prophylaxis. Continue with supportive care.  Critical care time 30 min

## 2017-10-24 NOTE — Progress Notes (Signed)
   10/24/17 0700  Clinical Encounter Type  Visited With Patient  Visit Type Follow-up;Spiritual support  Recommendations Follow-up as requested.  Spiritual Encounters  Spiritual Needs Emotional;Prayer   During morning prayers, the patient asked Chaplain to visit. Patient was teary and anxious. Chaplain provided a calming presence and helped the patient relax through prayer and affirmations. Patient wants to see her husband but prefers that her daughter not visit at this time. Visits tire her out and questions about the future lead her into an anxious state. Chaplain affirmed her right to limit interactions and focus on her health. Chaplain provided emotional support and prayer.

## 2017-10-24 NOTE — Progress Notes (Signed)
Deanna Blair   DOB:10/11/1955   ZO#:[id number]    Subjective: Patient was admitted to the hospital for worsening shortness of breath; currently in the ICU.  Patient is needing high flow nasal oxygen.  Continues to have cough.  Patient gets short of breath on minimal exertion.  Accompanied by Deanna Blair husband.  Objective:  Vitals:   10/24/17 1500 10/24/17 1700  BP:    Pulse: 87 87  Resp: (!) 23 18  Temp: 97.9 F (36.6 C)   SpO2: 97% 97%     Intake/Output Summary (Last 24 hours) at 10/24/2017 1747 Last data filed at 10/24/2017 1722 Gross per 24 hour  Intake 2790.77 ml  Output 1725 ml  Net 1065.77 ml    GENERAL Alert, no distress and comfortable.  On high flow nasal oxygen. EYES: no pallor or icterus OROPHARYNX: no thrush or ulceration. NECK: supple, no masses felt LYMPH:  no palpable lymphadenopathy in the cervical, axillary or inguinal regions LUNGS: Decreased breath sounds bilaterally.  Positive for crackles. HEART/CVS: Tachycardic regular rhythm rhythm and no murmurs; No lower extremity edema ABDOMEN: abdomen soft, tender  on deep palpation. and normal bowel sounds Musculoskeletal:no cyanosis of digits and no clubbing  PSYCH: alert & oriented x 3 with fluent speech; anxious tearful. NEURO: no focal motor/sensory deficits SKIN:  no rashes or significant lesions   Labs:  Lab Results  Component Value Date   WBC 14.2 (H) 10/24/2017   HGB 8.0 (L) 10/24/2017   HCT 25.0 (L) 10/24/2017   MCV 104.2 (H) 10/24/2017   PLT 309 10/24/2017   NEUTROABS 11.4 (H) 10/23/2017    Lab Results  Component Value Date   NA 137 10/24/2017   K 3.6 10/24/2017   CL 106 10/24/2017   CO2 23 10/24/2017    Studies:  Dg Chest Port 1 View  Result Date: 10/24/2017 CLINICAL DATA:  63 year old female with acute respiratory failure. Subsequent encounter. EXAM: PORTABLE CHEST 1 VIEW COMPARISON:  10/23/2017 chest x-ray.  09/20/2017 chest CT. FINDINGS: Diffuse asymmetric airspace disease greater on  the right similar to prior chest x-ray. Question infectious infiltrate superimposed upon chronic changes as versus result of multilobar lung adenocarcinoma as suggested on 09/20/2017 chest CT. MediPort catheter tip caval atrial junction level. No pneumothorax noted. Cardiomegaly. IMPRESSION: 1. No significant change in appearance of diffuse asymmetric airspace disease greater on the right. Question infectious infiltrate superimposed upon chronic changes as versus result of multilobar lung adenocarcinoma as suggested on 09/20/2017 chest CT. 2. Cardiomegaly. 3.  Aortic Atherosclerosis (ICD10-I70.0). Electronically Signed   By: Genia Del M.D.   On: 10/24/2017 07:31   Dg Chest Port 1 View  Result Date: 10/23/2017 CLINICAL DATA:  Shortness of breath EXAM: PORTABLE CHEST 1 VIEW COMPARISON:  Chest CT 09/20/2017 FINDINGS: Extensive reticular opacities throughout the right lung are compatible with the findings of the chest CT 09/20/2017, but have progressed since that time. The left lung remains relatively clear. Right chest wall Port-A-Cath tip is at the cavoatrial junction.Small right pleural effusion. IMPRESSION: Chronic right lung reticular opacities, which have progressed compared to the radiograph 09/15/2017. Small right pleural effusion. Electronically Signed   By: Ulyses Jarred M.D.   On: 10/23/2017 06:14    Assessment & Plan:   #62 year old female patient with history of metastatic adenocarcinoma the lung currently admitted to hospital for acute on chronic respiratory failure.  # Metastatic adenocarcinoma the lung status post multiple lines of therapy-clinically most likely progression of disease/based on chest x-rays/clinical evaluation.  Patient continuing high  flow nasal oxygen.  I had a long discussion the patient and husband regarding futility of further chemotherapy-given Deanna Blair poor response to chemotherapy/intolerance to chemotherapy/especially status post multiple lines of previous therapy.  At  this time the risk of treatment or with the benefits.  #Acute respiratory failure-wean high flow nasal oxygen; most likely secondary to progression of disease rather than any underlying overwhelming infectious process.  Defer to pulmonary for further management.  #CODE STATUS-DNR/DNI.  Long discussion the patient and wife regarding goals of care regarding palliation of symptoms/hospice.  Patient initially not agreeable to hospice.  However, after discussion with PACU nurse practitioner she is more open to hospice at home.  Discussed with Billey Chang nurse practitioner for palliative care.  # 40 minutes face-to-face with the patient discussing the above plan of care; more than 50% of time spent on prognosis/ natural history; counseling and coordination.    Cammie Sickle, MD 10/24/2017  5:47 PM

## 2017-10-24 NOTE — Progress Notes (Signed)
Pharmacy Antibiotic Note  Deanna Blair is a 62 y.o. female admitted on 10/23/2017 with pneumonia.  Pharmacy has been consulted for Cefepime dosing.  Patient has stage IV adenocarcinoma with brain mets. Was recently hospitalized in [place] ~10 days ago for PNA (unknown antibiotic course).  Plan: Discontinued Vancomycin per CCM rounds discussion. Continue Cefepime 2g IV every 8 hours.   Will discontinue Azithromycin 10/22 for total of 3 days of therapy per CCM discussion.  Height: 5\' 3"  (160 cm) Weight: 108 lb 0.4 oz (49 kg) IBW/kg (Calculated) : 52.4  Temp (24hrs), Avg:98.3 F (36.8 C), Min:97.7 F (36.5 C), Max:98.9 F (37.2 C)  Recent Labs  Lab 10/21/17 1443 10/23/17 0538 10/23/17 1000 10/24/17 0901  WBC 15.1* 14.0*  --  14.2*  CREATININE 0.69 0.63  --  0.55  LATICACIDVEN  --  1.5 1.7  --     Estimated Creatinine Clearance: 56.4 mL/min (by C-G formula based on SCr of 0.55 mg/dL).    No Known Allergies  Antimicrobials this admission: Azithromycin 10/20 >> 10/23 Vancomycin 10/20 >> 10/21 Ceftriaxone 10/20 >> 10/20 Cefepime 10/20 >>  Dose adjustments this admission: N/A  Microbiology results: 10/20 BCx: NG x 1 day 10/20 MRSA PCR: (-)  Thank you for allowing pharmacy to be a part of this patient's care.  Paticia Stack, PharmD Pharmacy Resident  10/24/2017 11:06 AM

## 2017-10-24 NOTE — Consult Note (Addendum)
Pharmacy Electrolyte Monitoring Consult:  Pharmacy consulted to assist in monitoring and replacing electrolytes in this 63 y.o. female admitted on 10/23/2017 with Shortness of Breath   Labs:  Sodium (mmol/L)  Date Value  10/24/2017 137  01/03/2015 139   Potassium (mmol/L)  Date Value  10/24/2017 3.6   Magnesium (mg/dL)  Date Value  10/24/2017 1.5 (L)   Phosphorus (mg/dL)  Date Value  10/24/2017 3.3   Calcium (mg/dL)  Date Value  10/24/2017 8.1 (L)   Albumin (g/dL)  Date Value  10/21/2017 2.9 (L)  01/03/2015 3.8    Assessment/Plan: Electrolytes Goals: Magnesium ~2.0, Potassium ~4.0 Ordered Magnesium sulfate 4 g IV x 1, and patient is receiving KCl 20 mEq PO BID daily.  Will order labs for AM.  Constipation No bowel movement recorded. Started patient's PTA medication of Fentanyl 25 mcg patch, with plans to transition off of prn Oxycodone.   Pharmacy will continue to monitor and adjust as necessary.  Paticia Stack, PharmD Pharmacy Resident  10/24/2017 10:58 AM

## 2017-10-24 NOTE — Consult Note (Signed)
[place]  [phone] [phone]   Name: Hebah Bogosian Date: 10/24/2017 MRN: [id number]  DOB: 02-Mar-1955  Patient Care Team: Mar Daring, PA-C as PCP - General (Family Medicine) Allyne Gee, MD as Referring Physician (Internal Medicine)    REASON FOR CONSULTATION: Palliative Care consult requested for this 62 y.o. 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  PMH also notable for anxiety, depression.  Patient was recently hospitalized in [place] with acute on chronic respiratory failure and discharged about 10 days prior to readmission at [facility] on 10/24/2017 with same.  Initially respiratory distress required BiPAP and then high flow nasal cannula.  Chest x-ray suggests diffuse asymmetric airspace disease greater on the right with infectious differential versus multilobar lung adenocarcinoma.  Palliative care has been asked to help address symptoms and goals.  SOCIAL HISTORY:    Patient is married and lives at home with husband.  She has a daughter who is involved in her care.  ADVANCE DIRECTIVES:  On file.  CODE STATUS: DNR  PAST MEDICAL HISTORY: Past Medical History:  Diagnosis Date  . Anxiety   . Cancer of right lung ([place]) 02/13/2015, 01/13/17  . Depression   . Depression with anxiety    Well controlled with Wellbutrin and Buspar  . Pneumonia     PAST SURGICAL HISTORY:  Past Surgical History:  Procedure Laterality Date  . ECTOPIC PREGNANCY SURGERY  1988  . ELECTROMAGNETIC NAVIGATION BROCHOSCOPY N/A 02/10/2015   Procedure: ELECTROMAGNETIC NAVIGATION BRONCHOSCOPY;  Surgeon: Flora Lipps, MD;  Location: [facility] ORS;  Service: Cardiopulmonary;  Laterality: N/A;  . ENDOBRONCHIAL ULTRASOUND N/A 02/10/2015   Procedure: ENDOBRONCHIAL  ULTRASOUND;  Surgeon: Flora Lipps, MD;  Location: [facility] ORS;  Service: Cardiopulmonary;  Laterality: N/A;  . PERIPHERAL VASCULAR CATHETERIZATION N/A 03/05/2015   Procedure: Glori Luis Cath Insertion;  Surgeon: Katha Cabal, MD;  Location: [facility];  Service: Cardiovascular;  Laterality: N/A;    HEMATOLOGY/ONCOLOGY HISTORY:  Oncology History   # FEB 2017-ADENO CA Right Lower Lung T4N2M0- STAGE IIIB [Bronch & Subcarinal LNBx]- March 8th START CARBO-ALIMTA x4 cycles- June 8th 2017 - IMPROVED FDG MULTI-FOCAL lesions/N2-LN activity. S/p [place]-- Alimta x6  # AUG 18th- Alimta- Avastin maintenance; SEP 13th DISCONTINUE AVASTIN [sec to cavitary lesion]; OCT 20th 2017- CT-STABLE Disease; NOV 8th CT/PET [facility] clinic]- "overall slight progression-? 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 The main right frontal lesion gotten smaller; improve edema.  # Jan 2019- Progression [CT chest; [facility]]; Brain MRI- new sub cm lesions- monitored  # feb 14th 2019- Tax-Cyram  # AUG 28th 2019- Gem   --------------------------------------------------------------     MOLECULAR STUDIES:  KRAS MUTATED/Tissues not sufficient for OTHER the molecular marker;  will need repeat Bx ; GUARDIANT TESTING [[facility]]- pending. # II opinion at [facility]; [phone]/[id number]]   [facility] - Jan 2019  [facility] clinic]- No targettable mutations.  ------------------------------------------------------   DIAGNOSIS: [ FEB 2017]- [place] CA LUNG  STAGE:   IV  ;GOALS: palliative  CURRENT/MOST RECENT THERAPY- [facility]      Primary cancer of right lower lobe of lung ([place])    ALLERGIES:  has No Known Allergies.  MEDICATIONS:  Current Facility-Administered Medications  Medication Dose Route Frequency Provider Last Rate Last Dose  . 0.9 %  sodium chloride infusion   Intravenous Continuous Dustin Flock, MD 75 mL/hr at 10/24/17 0844    . acetaminophen (TYLENOL) tablet 650 mg  650 mg Oral Q6H PRN Dustin Flock, MD       Or  . acetaminophen (TYLENOL) suppository 650 mg  650 mg Rectal Q6H PRN Dustin Flock, MD      . Derrill Memo ON 10/25/2017] azithromycin (ZITHROMAX) 500 mg in sodium chloride 0.9 % 250 mL IVPB  500 mg Intravenous Q24H Cyndee Brightly M, RPH      . budesonide (PULMICORT) nebulizer solution 0.25 mg  0.25 mg Nebulization BID Dustin Flock, MD   0.25 mg at 10/24/17 0704  . ceFEPIme (MAXIPIME) 2 g in sodium chloride 0.9 % 100 mL IVPB  2 g Intravenous Q8H Dustin Flock, MD   Stopped at 10/24/17 1914  . docusate sodium (COLACE) capsule 100 mg  100 mg Oral q morning - 10a Samaan, Maged, MD      . enoxaparin (LOVENOX) injection 40 mg  40 mg Subcutaneous Q24H Cyndee Brightly [place], [place]      . famotidine (PEPCID) tablet 20 mg  20 mg Oral BID Samaan, Maged, MD      . fentaNYL ([place] - dosed mcg/hr) patch 25 mcg  25 mcg Transdermal Q72H Samaan, Maged, MD      . Influenza vac split quadrivalent PF (FLUARIX) injection 0.5 mL  0.5 mL Intramuscular Tomorrow-1000 Vernard Gambles L, MD      . ipratropium-albuterol (DUONEB) 0.5-2.5 (3) MG/3ML nebulizer solution 3 mL  3 mL Nebulization Q4H Dustin Flock, MD   3 mL at 10/24/17 1103  . LORazepam (ATIVAN) tablet 1 mg  1 mg Oral Q6H PRN Tyler Pita, MD   1 mg at 10/24/17 0953  . magnesium sulfate IVPB 4 g 100 mL  4 g Intravenous Once Dustin Flock, MD      . methylPREDNISolone sodium succinate (SOLU-MEDROL) 125 mg/2 mL injection 60 mg  60 mg Intravenous Q6H Dustin Flock, MD   60 mg at 10/24/17 0834  . ondansetron (ZOFRAN) tablet 4 mg  4 mg Oral Q6H PRN Dustin Flock, MD       Or  . ondansetron [facility]) injection 4 mg  4 mg Intravenous Q6H PRN Dustin Flock, MD      . oxyCODONE (Oxy IR/ROXICODONE) immediate release tablet 10 mg  10 mg Oral Q4H PRN Tyler Pita, MD   10 mg at 10/24/17 1113  . polyethylene glycol (MIRALAX / GLYCOLAX) packet 17 g  17 g Oral Daily Samaan, Maged, MD      . potassium chloride SA (K-DUR,KLOR-CON) CR tablet 20 mEq  20 mEq Oral BID Dustin Flock, MD   20 mEq at 10/24/17 7829   Facility-Administered Medications Ordered in Other Encounters  Medication Dose Route Frequency Provider Last Rate Last Dose  . 0.9 %  sodium chloride infusion   Intravenous Continuous Jacquelin Hawking, NP      .  0.9 %  sodium chloride infusion   Intravenous Continuous Jacquelin Hawking, NP      . dexamethasone (DECADRON) injection 10 mg  10 mg Intravenous Once Faythe Casa E, NP      . heparin lock flush 100 unit/mL  500 Units Intravenous Once Cammie Sickle, MD        VITAL SIGNS: BP (!) 128/91 (BP Location: Right Arm)   Pulse 100   Temp 97.7 F (36.5 C) (Axillary)   Resp (!) 31   Ht 5' 3"  (1.6 m)   Wt 108 lb 0.4 oz (49 kg)   LMP 06/16/2000 (Approximate) Comment: 15 yrs ago  SpO2 90%   BMI 19.14 kg/m  Filed Weights   10/23/17 0535 10/23/17 0942  Weight: 101 lb 6.2 oz (46 kg) 108 lb 0.4 oz (49 kg)    Estimated body mass index is 19.14 kg/m as calculated from the following:   Height as of this encounter: 5' 3"  (1.6 m).   Weight as of this encounter: 108 lb 0.4 oz (49 kg).  LABS: CBC:    Component Value Date/Time   WBC 14.2 (H) 10/24/2017 0901   HGB 8.0 (L) 10/24/2017 0901   HGB 11.4 01/03/2015 0910   HCT 25.0 (L) 10/24/2017 0901   HCT 34.1 01/03/2015 0910   PLT 309 10/24/2017 0901    PLT 803 (HH) 12/20/2014 0948   MCV 104.2 (H) 10/24/2017 0901   MCV 94 01/03/2015 0910   NEUTROABS 11.4 (H) 10/23/2017 0538   NEUTROABS 4.3 01/03/2015 0910   LYMPHSABS 1.3 10/23/2017 0538   LYMPHSABS 1.0 01/03/2015 0910   MONOABS 0.8 10/23/2017 0538   EOSABS 0.5 10/23/2017 0538   EOSABS 0.2 01/03/2015 0910   BASOSABS 0.0 10/23/2017 0538   BASOSABS 0.0 01/03/2015 0910   Comprehensive Metabolic Panel:    Component Value Date/Time   NA 137 10/24/2017 0901   NA 139 01/03/2015 0910   K 3.6 10/24/2017 0901   CL 106 10/24/2017 0901   CO2 23 10/24/2017 0901   BUN 10 10/24/2017 0901   BUN 10 01/03/2015 0910   CREATININE 0.55 10/24/2017 0901   GLUCOSE 176 (H) 10/24/2017 0901   CALCIUM 8.1 (L) 10/24/2017 0901   AST 34 10/21/2017 1443   ALT 20 10/21/2017 1443   ALKPHOS 59 10/21/2017 1443   BILITOT 0.5 10/21/2017 1443   BILITOT <0.2 01/03/2015 0910   PROT 6.3 (L) 10/21/2017 1443   PROT 6.5 01/03/2015 0910   ALBUMIN 2.9 (L) 10/21/2017 1443   ALBUMIN 3.8 01/03/2015 0910    RADIOGRAPHIC STUDIES: Dg Chest Port 1 View  Result Date: 10/24/2017 CLINICAL DATA:  63 year old female with acute respiratory failure. Subsequent encounter. EXAM: PORTABLE CHEST 1 VIEW COMPARISON:  10/23/2017 chest x-ray.  09/20/2017 chest CT. FINDINGS: Diffuse asymmetric airspace disease greater on the right similar to prior chest x-ray. Question infectious infiltrate superimposed upon chronic changes as versus result of multilobar lung adenocarcinoma as suggested on 09/20/2017 chest CT. MediPort catheter tip caval atrial junction level. No pneumothorax noted. Cardiomegaly. IMPRESSION: 1. No significant change in appearance of diffuse asymmetric airspace disease greater on the right. Question infectious infiltrate superimposed upon chronic changes as versus result of multilobar lung adenocarcinoma as suggested on 09/20/2017 chest CT. 2. Cardiomegaly. 3.  Aortic Atherosclerosis (ICD10-I70.0). Electronically Signed   By:  Genia Del M.D.   On: 10/24/2017 07:31   Dg Chest Port 1 View  Result Date: 10/23/2017 CLINICAL DATA:  Shortness of breath EXAM: PORTABLE CHEST 1 VIEW COMPARISON:  Chest CT 09/20/2017 FINDINGS: Extensive reticular opacities throughout the right lung are compatible with the findings of the chest CT 09/20/2017, but have progressed since that time. The left lung remains relatively clear. Right chest wall Port-A-Cath tip is at the cavoatrial junction.Small right pleural effusion. IMPRESSION: Chronic right lung reticular opacities, which have progressed compared to the radiograph 09/15/2017. Small right pleural effusion. Electronically Signed   By: Ulyses Jarred M.D.   On: 10/23/2017 06:14    PERFORMANCE STATUS (ECOG) : 4 - Bedbound  Review of Systems As noted above. Otherwise, a complete review of systems is negative.  Physical Exam General:  frail appearing, lying in bed Cardiovascular: regular rate and rhythm Pulmonary: Poor air movement without wheeze, on high flow nasal cannula Abdomen: soft, nontender, + bowel sounds GU: no suprapubic tenderness Extremities: no edema, no joint deformities Skin: no rashes Neurological: Weakness but otherwise nonfocal  IMPRESSION: I met with patient and her husband.  Patient reports persistent dyspnea, which is worse with exertion.  She remains in ICU on high flow nasal cannula at 65% and 40 L.  I spoke with patient and her husband at length regarding her goals.  Husband was quick to tell me that she "had fight left in her".  However, patient candidly spoke about the possibility that she was approaching end of life and asked me to prognosticate.  She says another provider this hospitalization suggested that her life expectancy was likely measured in a few months.  Patient says she knows there might come a point where there is no more treatment or medical care that can improve her clinically.   We discussed in detail the differences between home health  versus hospice versus palliative care.  Patient suggested that she might be open to hospice in the home if it was apparent to her that there were no additional aggressive treatment options.  Going home with hospice on high flow has been an option for other patients in the past.  However, both FiO2 and liter concentration have to be much lower than patient is currently requiring.  Symptomatically, patient endorses anxiety and mood lability.  At home she is on Lorazepam 0.5 mg twice daily.  Patient requests scheduled lorazepam.  Will order.  Also recommend continued oxycodone, which might have the added benefit of helping to ease her breathlessness.  PLAN: 1.  Continue supportive care 2.  We will schedule lorazepam at home dosing/frequency 3.  DNR confirmed 4.  We will continue to discuss goals of care with patient and family  Time Total: 45 minutes  Visit consisted of counseling and education dealing with the complex and emotionally intense issues of symptom management and palliative care in the setting of serious and potentially life-threatening illness.Greater than 50%  of this time was spent counseling and coordinating care related to the above assessment and plan.  Signed by: Altha Harm, [place], NP-C, [place] (Work Cell)

## 2017-10-24 NOTE — Progress Notes (Signed)
[place] [facility]                                                                                                                                                                                  Patient Demographics   Deanna Blair, is a 62 y.o. female, DOB - 1955/10/02, IHK:[id number]  Admit date - 10/23/2017   Admitting Physician Dustin Flock, MD  Outpatient Primary MD for the patient is Mar Daring, PA-C   LOS - 1  Subjective:  Patient continues to be short of breath on high flow oxygen   Review of Systems:   CONSTITUTIONAL: No documented fever. No fatigue, weakness. No weight gain, no weight loss.  EYES: No blurry or double vision.  ENT: No tinnitus. No postnasal drip. No redness of the oropharynx.  RESPIRATORY: No cough, no wheeze, no hemoptysis.  Positive dyspnea.  CARDIOVASCULAR: No chest pain. No orthopnea. No palpitations. No syncope.  GASTROINTESTINAL: No nausea, no vomiting or diarrhea. No abdominal pain. No melena or hematochezia.  GENITOURINARY: No dysuria or hematuria.  ENDOCRINE: No polyuria or nocturia. No heat or cold intolerance.  HEMATOLOGY: No anemia. No bruising. No bleeding.  INTEGUMENTARY: No rashes. No lesions.  MUSCULOSKELETAL: No arthritis. No swelling. No gout.  NEUROLOGIC: No numbness, tingling, or ataxia. No seizure-type activity.  PSYCHIATRIC: No anxiety. No insomnia. No ADD.    Vitals:   Vitals:   10/24/17 1200 10/24/17 1300 10/24/17 1400 10/24/17 1500  BP:      Pulse: 83 (!) 103 96 87  Resp: 18 (!) 24 (!) 22 (!) 23  Temp:    97.9 F (36.6 C)  TempSrc:    Oral  SpO2: 98% (!) 89% 96% 97%  Weight:      Height:        Wt Readings from Last 3 Encounters:  10/23/17 49 kg  10/17/17 46 kg  09/21/17 49 kg     Intake/Output Summary (Last 24 hours) at 10/24/2017 1559 Last data filed at 10/24/2017 1526 Gross per 24 hour  Intake 2826.2 ml  Output 1725 ml  Net 1101.2 ml    Physical Exam:    GENERAL: Mild respiratory distress HEAD, EYES, EARS, NOSE AND THROAT: Atraumatic, normocephalic. Extraocular muscles are intact. Pupils equal and reactive to light. Sclerae anicteric. No conjunctival injection. No oro-pharyngeal erythema.  NECK: Supple. There is no jugular venous distention. No bruits, no lymphadenopathy, no thyromegaly.  HEART: Regular rate and rhythm,. No murmurs, no rubs, no clicks.  LUNGS: Diminished breath sounds bilaterally.  ABDOMEN: Soft, flat, nontender, nondistended. Has good bowel sounds. No hepatosplenomegaly appreciated.  EXTREMITIES: No evidence of any cyanosis, clubbing, or peripheral edema.  +  2 pedal and radial pulses bilaterally.  NEUROLOGIC: The patient is alert, awake, and oriented x3 with no focal motor or sensory deficits appreciated bilaterally.  SKIN: Moist and warm with no rashes appreciated.  Psych: Not anxious, depressed LN: No inguinal LN enlargement    Antibiotics   Anti-infectives (From admission, onward)   Start     Dose/Rate Route Frequency Ordered Stop   10/25/17 1000  azithromycin (ZITHROMAX) 500 mg in sodium chloride 0.9 % 250 mL IVPB     500 mg 250 mL/hr over 60 Minutes Intravenous Every 24 hours 10/24/17 0912 10/25/17 2359   10/23/17 1800  vancomycin (VANCOCIN) IVPB 750 mg/150 ml premix  Status:  Discontinued     750 mg 150 mL/hr over 60 Minutes Intravenous Every 12 hours 10/23/17 0959 10/24/17 1011   10/23/17 1400  ceFEPIme (MAXIPIME) 2 g in sodium chloride 0.9 % 100 mL IVPB     2 g 200 mL/hr over 30 Minutes Intravenous Every 8 hours 10/23/17 0959     10/23/17 0645  vancomycin (VANCOCIN) IVPB 1000 mg/200 mL premix     1,000 mg 200 mL/hr over 60 Minutes Intravenous  Once 10/23/17 0632 10/23/17 0848   10/23/17 0545  cefTRIAXone (ROCEPHIN) 2 g in sodium chloride 0.9 % 100 mL IVPB  Status:  Discontinued     2 g 200 mL/hr over 30 Minutes Intravenous Every 24 hours 10/23/17 0539 10/23/17 1002   10/23/17 0545  azithromycin (ZITHROMAX)  500 mg in sodium chloride 0.9 % 250 mL IVPB  Status:  Discontinued     500 mg 250 mL/hr over 60 Minutes Intravenous Every 24 hours 10/23/17 0539 10/24/17 0912      Medications   Scheduled Meds: . budesonide (PULMICORT) nebulizer solution  0.25 mg Nebulization BID  . docusate sodium  100 mg Oral BID  . enoxaparin (LOVENOX) injection  40 mg Subcutaneous Q24H  . famotidine  20 mg Oral BID  . fentaNYL  25 mcg Transdermal Q72H  . Influenza vac split quadrivalent PF  0.5 mL Intramuscular Tomorrow-1000  . ipratropium-albuterol  3 mL Nebulization Q4H  . LORazepam  0.5 mg Oral BID  . methylPREDNISolone (SOLU-MEDROL) injection  60 mg Intravenous Q6H  . polyethylene glycol  17 g Oral Daily  . potassium chloride SA  20 mEq Oral BID   Continuous Infusions: . sodium chloride Stopped (10/24/17 1450)  . [START ON 10/25/2017] azithromycin    . ceFEPime (MAXIPIME) IV Stopped (10/24/17 0622)  . magnesium sulfate 1 - 4 g bolus IVPB 50 mL/hr at 10/24/17 1526   PRN Meds:.acetaminophen **OR** acetaminophen, LORazepam, ondansetron **OR** ondansetron (ZOFRAN) IV, oxyCODONE   Data Review:   Micro Results Recent Results (from the past 240 hour(s))  Blood Culture (routine x 2)     Status: None (Preliminary result)   Collection Time: 10/23/17  5:45 AM  Result Value Ref Range Status   Specimen Description BLOOD BLOOD LEFT FOREARM  Final   Special Requests   Final    BOTTLES DRAWN AEROBIC AND ANAEROBIC Blood Culture results may not be optimal due to an inadequate volume of blood received in culture bottles   Culture   Final    NO GROWTH 1 DAY Performed at [facility], [address] [place], [place] [address]    Report Status PENDING  Incomplete  Blood Culture (routine x 2)     Status: None (Preliminary result)   Collection Time: 10/23/17  5:45 AM  Result Value Ref Range Status   Specimen  Description BLOOD BLOOD RIGHT FOREARM  Final   Special Requests   Final    BOTTLES DRAWN AEROBIC  AND ANAEROBIC Blood Culture adequate volume   Culture   Final    NO GROWTH 1 DAY Performed at [facility], [address] [place], [place] [address]    Report Status PENDING  Incomplete  MRSA PCR Screening     Status: None   Collection Time: 10/23/17  9:47 AM  Result Value Ref Range Status   MRSA by PCR NEGATIVE NEGATIVE Final    Comment:        The GeneXpert MRSA Assay (FDA approved for NASAL specimens only), is one component of a comprehensive MRSA colonization surveillance program. It is not intended to diagnose MRSA infection nor to guide or monitor treatment for MRSA infections. Performed at [facility], [address], [place], [place] [address]     Radiology Reports Dg Chest [place] 1 View  Result Date: 10/24/2017 CLINICAL DATA:  62 year old female with acute respiratory failure. Subsequent encounter. EXAM: PORTABLE CHEST 1 VIEW COMPARISON:  10/23/2017 chest x-ray.  09/20/2017 chest CT. FINDINGS: Diffuse asymmetric airspace disease greater on the right similar to prior chest x-ray. Question infectious infiltrate superimposed upon chronic changes as versus result of multilobar lung adenocarcinoma as suggested on 09/20/2017 chest CT. MediPort catheter tip caval atrial junction level. No pneumothorax noted. Cardiomegaly. IMPRESSION: 1. No significant change in appearance of diffuse asymmetric airspace disease greater on the right. Question infectious infiltrate superimposed upon chronic changes as versus result of multilobar lung adenocarcinoma as suggested on 09/20/2017 chest CT. 2. Cardiomegaly. 3.  Aortic Atherosclerosis (ICD10-I70.0). Electronically Signed   By: Genia Del M.D.   On: 10/24/2017 07:31   Dg Chest Port 1 View  Result Date: 10/23/2017 CLINICAL DATA:  Shortness of breath EXAM: PORTABLE CHEST 1 VIEW COMPARISON:  Chest CT 09/20/2017 FINDINGS: Extensive reticular opacities throughout the right lung are compatible with the findings of the chest  CT 09/20/2017, but have progressed since that time. The left lung remains relatively clear. Right chest wall Port-A-Cath tip is at the cavoatrial junction.Small right pleural effusion. IMPRESSION: Chronic right lung reticular opacities, which have progressed compared to the radiograph 09/15/2017. Small right pleural effusion. Electronically Signed   By: Ulyses Jarred M.D.   On: 10/23/2017 06:14     CBC Recent Labs  Lab 10/21/17 1443 10/23/17 0538 10/24/17 0901  WBC 15.1* 14.0* 14.2*  HGB 9.5* 9.5* 8.0*  HCT 29.6* 30.3* 25.0*  PLT 308 362 309  MCV 101.7* 104.1* 104.2*  MCH 32.6 32.6 33.3  MCHC 32.1 31.4 32.0  RDW 19.3* 19.2* 19.1*  LYMPHSABS 0.3* 1.3  --   MONOABS 0.4 0.8  --   EOSABS 0.0 0.5  --   BASOSABS 0.0 0.0  --     Chemistries  Recent Labs  Lab 10/21/17 1443 10/23/17 0538 10/24/17 0901  NA 133* 136 137  K 3.5 3.3* 3.6  CL 95* 99 106  CO2 27 24 23   GLUCOSE 246* 139* 176*  BUN 16 11 10   CREATININE 0.69 0.63 0.55  CALCIUM 8.9 8.8* 8.1*  MG  --   --  1.5*  AST 34  --   --   ALT 20  --   --   ALKPHOS 59  --   --   BILITOT 0.5  --   --    ------------------------------------------------------------------------------------------------------------------ estimated creatinine clearance is 56.4 mL/min (by C-G formula based on SCr of 0.55 mg/dL). ------------------------------------------------------------------------------------------------------------------ No  results for input(s): HGBA1C in the last 72 hours. ------------------------------------------------------------------------------------------------------------------ No results for input(s): CHOL, HDL, LDLCALC, TRIG, CHOLHDL, LDLDIRECT in the last 72 hours. ------------------------------------------------------------------------------------------------------------------ No results for input(s): TSH, T4TOTAL, T3FREE, THYROIDAB in the last 72 hours.  Invalid input(s):  FREET3 ------------------------------------------------------------------------------------------------------------------ No results for input(s): VITAMINB12, FOLATE, FERRITIN, TIBC, IRON, RETICCTPCT in the last 72 hours.  Coagulation profile No results for input(s): INR, PROTIME in the last 168 hours.  No results for input(s): DDIMER in the last 72 hours.  Cardiac Enzymes Recent Labs  Lab 10/23/17 0538  TROPONINI <0.03   ------------------------------------------------------------------------------------------------------------------ Invalid input(s): POCBNP    Assessment & Plan   IMPRESSION AND PLAN: Patient is 62 year old with adenocarcinoma of the lung presenting with shortness of breath  1.  Acute respiratory failure This is related to progression of her lung cancer continue supportive care we will try to decrease oxygen requirement for discharge to home with home hospice  2.  Sepsis based on tachycardia, leukocytosis, elevated respiratory rate Continue antibiotics as above  3.  Adenocarcinoma based on CT scan in September  Seen by oncology  4.  Anxiety continue home medication  5.  Miscellaneous Lovenox for DVT prophylaxis     Code Status Orders  (From admission, onward)         Start     Ordered   10/23/17 0852  Do not attempt resuscitation (DNR)  Continuous    Question Answer Comment  In the event of cardiac or respiratory ARREST Do not call a "code blue"   In the event of cardiac or respiratory ARREST Do not perform Intubation, CPR, defibrillation or ACLS   In the event of cardiac or respiratory ARREST Use medication by any route, position, wound care, and other measures to relive pain and suffering. May use oxygen, suction and manual treatment of airway obstruction as needed for comfort.      10/23/17 0851        Code Status History    Date Active Date Inactive Code Status Order ID Comments User Context   06/21/2016 1751 06/23/2016 1653 Full Code  [id number]  Nicholes Mango, MD Inpatient    Advance Directive Documentation     Most Recent Value  Type of Advance Directive  Out of facility DNR (pink MOST or yellow form)  Pre-existing out of facility DNR order (yellow form or pink MOST form)  Yellow form placed in chart (order not valid for inpatient use)  "MOST" Form in Place?  -           Consults oncology   DVT Prophylaxis  Lovenox   Lab Results  Component Value Date   PLT 309 10/24/2017     Time Spent in minutes   19min  Greater than 50% of time spent in care coordination and counseling patient regarding the condition and plan of care.   Dustin Flock M.D on 10/24/2017 at 3:59 PM  Between 7am to 6pm - Pager - [phone]  After 6pm go to [email or web address] - Proofreader  [employer]   Office  [phone]

## 2017-10-25 ENCOUNTER — Inpatient Hospital Stay: Payer: BLUE CROSS/BLUE SHIELD | Admitting: Oncology

## 2017-10-25 ENCOUNTER — Telehealth: Payer: Self-pay | Admitting: *Deleted

## 2017-10-25 ENCOUNTER — Inpatient Hospital Stay: Payer: BLUE CROSS/BLUE SHIELD

## 2017-10-25 LAB — BASIC METABOLIC PANEL
Anion gap: 7 (ref 5–15)
BUN: 12 mg/dL (ref 8–23)
CHLORIDE: 109 mmol/L (ref 98–111)
CO2: 24 mmol/L (ref 22–32)
CREATININE: 0.49 mg/dL (ref 0.44–1.00)
Calcium: 8.5 mg/dL — ABNORMAL LOW (ref 8.9–10.3)
GFR calc Af Amer: >60 mL/min (ref >60)
GFR calc non Af Amer: >60 mL/min (ref >60)
Glucose, Bld: 181 mg/dL — ABNORMAL HIGH (ref 70–99)
Potassium: 3.5 mmol/L (ref 3.5–5.1)
Sodium: 140 mmol/L (ref 135–145)

## 2017-10-25 LAB — MAGNESIUM: Magnesium: 2.4 mg/dL (ref 1.7–2.4)

## 2017-10-25 LAB — PHOSPHORUS: PHOSPHORUS: 3.2 mg/dL (ref 2.5–4.6)

## 2017-10-25 MED ORDER — OXYCODONE HCL 5 MG PO TABS
10.0000 mg | ORAL_TABLET | ORAL | Status: DC | PRN
  Administered 2017-10-25 – 2017-10-28 (×16): 10 mg via ORAL
  Filled 2017-10-25 (×16): qty 2

## 2017-10-25 MED ORDER — SODIUM CHLORIDE 0.9 % IV SOLN
2.0000 g | Freq: Two times a day (BID) | INTRAVENOUS | Status: DC
  Administered 2017-10-25: 2 g via INTRAVENOUS
  Filled 2017-10-25 (×3): qty 2

## 2017-10-25 MED ORDER — SENNOSIDES-DOCUSATE SODIUM 8.6-50 MG PO TABS
2.0000 | ORAL_TABLET | Freq: Two times a day (BID) | ORAL | Status: DC
  Administered 2017-10-25 – 2017-10-27 (×4): 2 via ORAL
  Filled 2017-10-25 (×5): qty 2

## 2017-10-25 MED ORDER — POTASSIUM CHLORIDE CRYS ER 20 MEQ PO TBCR
40.0000 meq | EXTENDED_RELEASE_TABLET | Freq: Once | ORAL | Status: AC
  Administered 2017-10-25: 40 meq via ORAL
  Filled 2017-10-25: qty 2

## 2017-10-25 MED ORDER — FUROSEMIDE 10 MG/ML IJ SOLN
20.0000 mg | Freq: Once | INTRAMUSCULAR | Status: AC
  Administered 2017-10-25: 20 mg via INTRAVENOUS
  Filled 2017-10-25: qty 2

## 2017-10-25 MED ORDER — SENNOSIDES-DOCUSATE SODIUM 8.6-50 MG PO TABS: 1.0000 | ORAL_TABLET | Freq: Two times a day (BID) | ORAL | Status: DC

## 2017-10-25 MED ORDER — METHYLPREDNISOLONE SODIUM SUCC 125 MG IJ SOLR
60.0000 mg | Freq: Three times a day (TID) | INTRAMUSCULAR | Status: DC
  Administered 2017-10-25 – 2017-10-26 (×3): 60 mg via INTRAVENOUS
  Filled 2017-10-25 (×3): qty 2

## 2017-10-25 NOTE — Care Management Note (Signed)
Case Management Note  Patient Details  Name: Deanna Blair MRN: [id number] Date of Birth: [birth year]  Subjective/Objective:          Spoke with patient and with husband Deanna Blair.  Patient is sitting up in bed, high flow [place] at 65%.  For patient to go home with hospice her oxygen requirements need to be weaned.  Hospice and take up to 15 Liters O2, per [place] liaison.   I relayed this information to the ICU MD and to the patient and family.   Currently the patient is getting oxygen equipment from [place].  I spoke with [employer] representative Estill Bamberg Ankrum at [phone]- she said that [place] would not be needed when the patient goes home with hospice- hospice will provide all needed services and equipment.  Currently the patient has no equipment in the home other than the oxygen concentrator, but she could benefit from a RW and 3 in 1.  The patient needs to sleep elevated so she sleeps on the couch elevated with pillows, she could benefit from a wedge pillow.  I will discuss equipment needs with Flo Shanks.  I will cont to follow.           Action/Plan:  Diurese today and try weaning oxygen requirements.   Expected Discharge Date:  10/25/17               Expected Discharge Plan:  Home w Hospice Care  In-House Referral:  Hospice / Palliative Care  Discharge planning Services  CM Consult  Post Acute Care Choice:    Choice offered to:  Patient, Spouse  DME Arranged:    DME Agency:     HH Arranged:    [place] Agency:     Status of Service:  In process, will continue to follow  If discussed at Long Length of Stay Meetings, dates discussed:    Additional Comments:  Shelbie Hutching, RN 10/25/2017, 11:02 AM

## 2017-10-25 NOTE — Progress Notes (Signed)
RN had been changing patients bedding and had patient rolling from side to side to assist with bedding change. Patients WOB elevated and sats reading in the low 70's but with bad waveform. Placed sat probe on patient ear and sats were 77% with great wave form. Placed patient back on Heated High Flow nasal cannula at 40l and 65%

## 2017-10-25 NOTE — Progress Notes (Signed)
[facility]                                                                                                                                                                                  Patient Demographics   Deanna Blair, is a 62 y.o. female, DOB - [birth year], QIH:[id number]  Admit date - 10/23/2017   Admitting Physician Dustin Flock, MD  Outpatient Primary MD for the patient is Mar Daring, PA-C   LOS - 2  Subjective:  Patient continues to be short of breath on high flow oxygen   Review of Systems:   CONSTITUTIONAL: No documented fever. No fatigue, weakness. No weight gain, no weight loss.  EYES: No blurry or double vision.  ENT: No tinnitus. No postnasal drip. No redness of the oropharynx.  RESPIRATORY: No cough, no wheeze, no hemoptysis.  Positive dyspnea.  CARDIOVASCULAR: No chest pain. No orthopnea. No palpitations. No syncope.  GASTROINTESTINAL: No nausea, no vomiting or diarrhea. No abdominal pain. No melena or hematochezia.  GENITOURINARY: No dysuria or hematuria.  ENDOCRINE: No polyuria or nocturia. No heat or cold intolerance.  HEMATOLOGY: No anemia. No bruising. No bleeding.  INTEGUMENTARY: No rashes. No lesions.  MUSCULOSKELETAL: No arthritis. No swelling. No gout.  NEUROLOGIC: No numbness, tingling, or ataxia. No seizure-type activity.  PSYCHIATRIC: No anxiety. No insomnia. No ADD.    Vitals:   Vitals:   10/25/17 1300 10/25/17 1400 10/25/17 1500 10/25/17 1529  BP: 102/79 112/82 117/83   Pulse: 77 70 100   Resp: 18 19 (!) 26   Temp:  97.9 F (36.6 C)    TempSrc:  Oral    SpO2: 98% 96% 97% 95%  Weight:      Height:        Wt Readings from Last 3 Encounters:  10/23/17 49 kg  10/17/17 46 kg  09/21/17 49 kg     Intake/Output Summary (Last 24 hours) at 10/25/2017 1610 Last data filed at 10/25/2017 1400 Gross per 24 hour  Intake 998.19 ml  Output 1750 ml  Net -751.81 ml    Physical Exam:    GENERAL: Mild respiratory distress HEAD, EYES, EARS, NOSE AND THROAT: Atraumatic, normocephalic. Extraocular muscles are intact. Pupils equal and reactive to light. Sclerae anicteric. No conjunctival injection. No oro-pharyngeal erythema.  NECK: Supple. There is no jugular venous distention. No bruits, no lymphadenopathy, no thyromegaly.  HEART: Regular rate and rhythm,. No murmurs, no rubs, no clicks.  LUNGS: Diminished breath sounds bilaterally.  ABDOMEN: Soft, flat, nontender, nondistended. Has good bowel sounds. No hepatosplenomegaly appreciated.  EXTREMITIES: No evidence of any cyanosis, clubbing, or peripheral edema.  +2 pedal and radial  pulses bilaterally.  NEUROLOGIC: The patient is alert, awake, and oriented x3 with no focal motor or sensory deficits appreciated bilaterally.  SKIN: Moist and warm with no rashes appreciated.  Psych: Not anxious, depressed LN: No inguinal LN enlargement    Antibiotics   Anti-infectives (From admission, onward)   Start     Dose/Rate Route Frequency Ordered Stop   10/25/17 1800  ceFEPIme (MAXIPIME) 2 g in sodium chloride 0.9 % 100 mL IVPB     2 g 200 mL/hr over 30 Minutes Intravenous Every 12 hours 10/25/17 1053     10/25/17 1000  azithromycin (ZITHROMAX) 500 mg in sodium chloride 0.9 % 250 mL IVPB     500 mg 250 mL/hr over 60 Minutes Intravenous Every 24 hours 10/24/17 0912 10/25/17 1040   10/23/17 1800  vancomycin (VANCOCIN) IVPB 750 mg/150 ml premix  Status:  Discontinued     750 mg 150 mL/hr over 60 Minutes Intravenous Every 12 hours 10/23/17 0959 10/24/17 1011   10/23/17 1400  ceFEPIme (MAXIPIME) 2 g in sodium chloride 0.9 % 100 mL IVPB  Status:  Discontinued     2 g 200 mL/hr over 30 Minutes Intravenous Every 8 hours 10/23/17 0959 10/25/17 1053   10/23/17 0645  vancomycin (VANCOCIN) IVPB 1000 mg/200 mL premix     1,000 mg 200 mL/hr over 60 Minutes Intravenous  Once 10/23/17 0632 10/23/17 0848   10/23/17 0545  cefTRIAXone (ROCEPHIN) 2 g  in sodium chloride 0.9 % 100 mL IVPB  Status:  Discontinued     2 g 200 mL/hr over 30 Minutes Intravenous Every 24 hours 10/23/17 0539 10/23/17 1002   10/23/17 0545  azithromycin (ZITHROMAX) 500 mg in sodium chloride 0.9 % 250 mL IVPB  Status:  Discontinued     500 mg 250 mL/hr over 60 Minutes Intravenous Every 24 hours 10/23/17 0539 10/24/17 0912      Medications   Scheduled Meds: . budesonide (PULMICORT) nebulizer solution  0.25 mg Nebulization BID  . enoxaparin (LOVENOX) injection  40 mg Subcutaneous Q24H  . famotidine  20 mg Oral BID  . fentaNYL  25 mcg Transdermal Q72H  . Influenza vac split quadrivalent PF  0.5 mL Intramuscular Tomorrow-1000  . ipratropium-albuterol  3 mL Nebulization Q4H  . LORazepam  0.5 mg Oral BID  . methylPREDNISolone (SOLU-MEDROL) injection  60 mg Intravenous Q8H  . polyethylene glycol  17 g Oral Daily  . senna-docusate  2 tablet Oral BID   Continuous Infusions: . ceFEPime (MAXIPIME) IV     PRN Meds:.acetaminophen **OR** acetaminophen, LORazepam, ondansetron **OR** ondansetron (ZOFRAN) IV, oxyCODONE   Data Review:   Micro Results Recent Results (from the past 240 hour(s))  Blood Culture (routine x 2)     Status: None (Preliminary result)   Collection Time: 10/23/17  5:45 AM  Result Value Ref Range Status   Specimen Description BLOOD BLOOD LEFT FOREARM  Final   Special Requests   Final    BOTTLES DRAWN AEROBIC AND ANAEROBIC Blood Culture results may not be optimal due to an inadequate volume of blood received in culture bottles   Culture   Final    NO GROWTH 2 DAYS Performed at [facility], [address] [place], [place] [address]    Report Status PENDING  Incomplete  Blood Culture (routine x 2)     Status: None (Preliminary result)   Collection Time: 10/23/17  5:45 AM  Result Value Ref Range Status   Specimen Description BLOOD BLOOD RIGHT FOREARM  Final   Special Requests   Final    BOTTLES DRAWN AEROBIC AND ANAEROBIC Blood  Culture adequate volume   Culture   Final    NO GROWTH 2 DAYS Performed at [facility], [address] [place], [place] [address]    Report Status PENDING  Incomplete  MRSA PCR Screening     Status: None   Collection Time: 10/23/17  9:47 AM  Result Value Ref Range Status   MRSA by PCR NEGATIVE NEGATIVE Final    Comment:        The GeneXpert MRSA Assay (FDA approved for NASAL specimens only), is one component of a comprehensive MRSA colonization surveillance program. It is not intended to diagnose MRSA infection nor to guide or monitor treatment for MRSA infections. Performed at [facility], [address], [place],  [address]     Radiology Reports Dg Chest [place] 1 View  Result Date: 10/25/2017 CLINICAL DATA:  Congestive heart failure. EXAM: PORTABLE CHEST 1 VIEW COMPARISON:  Radiograph of October 24, 2017. FINDINGS: Stable cardiomediastinal silhouette. Stable bilateral lung opacities are noted concerning for inflammation and possible associated chronic disease. No pneumothorax or large pleural effusion is noted. Right internal jugular Port-A-Cath is unchanged compared to prior exam. IMPRESSION: Stable bilateral lung opacities as described above. Electronically Signed   By: Marijo Conception, M.D.   On: 10/25/2017 07:27   Dg Chest Port 1 View  Result Date: 10/24/2017 CLINICAL DATA:  62 year old female with acute respiratory failure. Subsequent encounter. EXAM: PORTABLE CHEST 1 VIEW COMPARISON:  10/23/2017 chest x-ray.  09/20/2017 chest CT. FINDINGS: Diffuse asymmetric airspace disease greater on the right similar to prior chest x-ray. Question infectious infiltrate superimposed upon chronic changes as versus result of multilobar lung adenocarcinoma as suggested on 09/20/2017 chest CT. MediPort catheter tip caval atrial junction level. No pneumothorax noted. Cardiomegaly. IMPRESSION: 1. No significant change in appearance of diffuse asymmetric airspace  disease greater on the right. Question infectious infiltrate superimposed upon chronic changes as versus result of multilobar lung adenocarcinoma as suggested on 09/20/2017 chest CT. 2. Cardiomegaly. 3.  Aortic Atherosclerosis (ICD10-I70.0). Electronically Signed   By: Genia Del M.D.   On: 10/24/2017 07:31   Dg Chest Port 1 View  Result Date: 10/23/2017 CLINICAL DATA:  Shortness of breath EXAM: PORTABLE CHEST 1 VIEW COMPARISON:  Chest CT 09/20/2017 FINDINGS: Extensive reticular opacities throughout the right lung are compatible with the findings of the chest CT 09/20/2017, but have progressed since that time. The left lung remains relatively clear. Right chest wall Port-A-Cath tip is at the cavoatrial junction.Small right pleural effusion. IMPRESSION: Chronic right lung reticular opacities, which have progressed compared to the radiograph 09/15/2017. Small right pleural effusion. Electronically Signed   By: Ulyses Jarred M.D.   On: 10/23/2017 06:14     CBC Recent Labs  Lab 10/21/17 1443 10/23/17 0538 10/24/17 0901  WBC 15.1* 14.0* 14.2*  HGB 9.5* 9.5* 8.0*  HCT 29.6* 30.3* 25.0*  PLT 308 362 309  MCV 101.7* 104.1* 104.2*  MCH 32.6 32.6 33.3  MCHC 32.1 31.4 32.0  RDW 19.3* 19.2* 19.1*  LYMPHSABS 0.3* 1.3  --   MONOABS 0.4 0.8  --   EOSABS 0.0 0.5  --   BASOSABS 0.0 0.0  --     Chemistries  Recent Labs  Lab 10/21/17 1443 10/23/17 0538 10/24/17 0901 10/25/17 0503  NA 133* 136 137 140  K 3.5 3.3* 3.6 3.5  CL 95* 99 106 109  CO2 27  24 23 24   GLUCOSE 246* 139* 176* 181*  BUN 16 11 10 12   CREATININE 0.69 0.63 0.55 0.49  CALCIUM 8.9 8.8* 8.1* 8.5*  MG  --   --  1.5* 2.4  AST 34  --   --   --   ALT 20  --   --   --   ALKPHOS 59  --   --   --   BILITOT 0.5  --   --   --    ------------------------------------------------------------------------------------------------------------------ estimated creatinine clearance is 56.4 mL/min (by C-G formula based on SCr of 0.49  mg/dL). ------------------------------------------------------------------------------------------------------------------ No results for input(s): HGBA1C in the last 72 hours. ------------------------------------------------------------------------------------------------------------------ No results for input(s): CHOL, HDL, LDLCALC, TRIG, CHOLHDL, LDLDIRECT in the last 72 hours. ------------------------------------------------------------------------------------------------------------------ No results for input(s): TSH, T4TOTAL, T3FREE, THYROIDAB in the last 72 hours.  Invalid input(s): FREET3 ------------------------------------------------------------------------------------------------------------------ No results for input(s): VITAMINB12, FOLATE, FERRITIN, TIBC, IRON, RETICCTPCT in the last 72 hours.  Coagulation profile No results for input(s): INR, PROTIME in the last 168 hours.  No results for input(s): DDIMER in the last 72 hours.  Cardiac Enzymes Recent Labs  Lab 10/23/17 0538  TROPONINI <0.03   ------------------------------------------------------------------------------------------------------------------ Invalid input(s): POCBNP    Assessment & Plan   IMPRESSION AND PLAN: Patient is 62 year old with adenocarcinoma of the lung presenting with shortness of breath  1.  Acute respiratory failure This is related to progression of her lung cancer continue supportive care we will try to decrease oxygen requirement for discharge to home with home hospice  2.  Sepsis based on tachycardia, leukocytosis, elevated respiratory rate Continue antibiotics as above  3.  Adenocarcinoma based on CT scan in September  Seen by oncology  4.  Anxiety continue home medication  5.  Miscellaneous Lovenox for DVT prophylaxis     Code Status Orders  (From admission, onward)         Start     Ordered   10/23/17 0852  Do not attempt resuscitation (DNR)  Continuous     Question Answer Comment  In the event of cardiac or respiratory ARREST Do not call a "code blue"   In the event of cardiac or respiratory ARREST Do not perform Intubation, CPR, defibrillation or ACLS   In the event of cardiac or respiratory ARREST Use medication by any route, position, wound care, and other measures to relive pain and suffering. May use oxygen, suction and manual treatment of airway obstruction as needed for comfort.      10/23/17 0851        Code Status History    Date Active Date Inactive Code Status Order ID Comments User Context   06/21/2016 1751 06/23/2016 1653 Full Code [id number]  Nicholes Mango, MD Inpatient    Advance Directive Documentation     Most Recent Value  Type of Advance Directive  Out of facility DNR (pink MOST or yellow form)  Pre-existing out of facility DNR order (yellow form or pink MOST form)  Yellow form placed in chart (order not valid for inpatient use)  "MOST" Form in Place?  -           Consults oncology   DVT Prophylaxis  Lovenox   Lab Results  Component Value Date   PLT 309 10/24/2017     Time Spent in minutes   85min  Greater than 50% of time spent in care coordination and counseling patient regarding the condition and plan of care.   Dustin Flock M.D on 10/25/2017 at  4:10 PM  Between 7am to 6pm - Pager - [phone]  After 6pm go to [email or web address] - Proofreader  [employer]   Office  [phone]

## 2017-10-25 NOTE — Progress Notes (Signed)
Name: Deanna Blair MRN: [id number] DOB: Jun 19, 1955     CONSULTATION DATE: 10/23/2017  Subjective & Objectives: Remains on HFNC 60%, started on Fentanyl patch and continues to require Oxycodone.  PAST MEDICAL HISTORY :   has a past medical history of Anxiety, Cancer of right lung ([place]) (02/13/2015, 01/13/17), Depression, Depression with anxiety, and Pneumonia.  has a past surgical history that includes Ectopic pregnancy surgery (1988); Endobronchial ultrasound (N/A, 02/10/2015); Electormagnetic navigation bronchoscopy (N/A, 02/10/2015); and Cardiac catheterization (N/A, 03/05/2015). Prior to Admission medications   Medication Sig Start Date End Date Taking? Authorizing Provider  acetaminophen (TYLENOL) 500 MG tablet Take 1,000 mg by mouth every 4 (four) hours as needed for mild pain or moderate pain.    Yes [provider]  albuterol (PROVENTIL) (2.5 MG/3ML) 0.083% nebulizer solution Take 2.5 mg by nebulization every 6 (six) hours as needed for wheezing.  10/12/17  Yes [provider]  BEVESPI AEROSPHERE 9-4.8 MCG/ACT AERO Inhale 2 puffs into the lungs 2 (two) times daily. 10/12/17  Yes [provider]  docusate sodium (COLACE) 100 MG capsule Take 100 mg by mouth 2 (two) times daily.   Yes [provider]  lidocaine-prilocaine (EMLA) cream Apply 1 application topically as needed. 12/22/16  Yes Cammie Sickle, MD  LORazepam (ATIVAN) 0.5 MG tablet Take 1 tablet (0.5 mg total) by mouth 2 (two) times daily. As needed for anxiety/ nausea 08/24/17  Yes Cammie Sickle, MD  Oxycodone HCl 10 MG TABS Take 1 tablet (10 mg total) by mouth every 6 (six) hours as needed (moderate to severe pain). 09/21/17  Yes Cammie Sickle, MD  polyethylene glycol (MIRALAX / GLYCOLAX) packet Take 17 g by mouth daily as needed for mild constipation or moderate constipation.    Yes [provider]  VENTOLIN HFA 108 (90 Base) MCG/ACT inhaler Inhale 1 puff into the  lungs every 6 (six) hours as needed for wheezing or shortness of breath.  10/12/17  Yes [provider]  chlorpheniramine-HYDROcodone (TUSSIONEX) 10-8 MG/5ML SUER Take 5 mLs by mouth 2 (two) times daily. Patient not taking: Reported on 10/21/2017 09/21/17   Cammie Sickle, MD  dexamethasone (DECADRON) 4 MG tablet Take 1 tablet (4 mg total) by mouth 2 (two) times daily with a meal. Start the day prior to chemo; for 3 days. Patient not taking: Reported on 10/21/2017 02/14/17   Cammie Sickle, MD  fentaNYL (DURAGESIC - DOSED MCG/HR) 25 MCG/HR patch Place 1 patch (25 mcg total) onto the skin every 3 (three) days. Patient not taking: Reported on 10/21/2017 09/09/17   Cammie Sickle, MD  Fluticasone-Umeclidin-Vilant (TRELEGY ELLIPTA) 100-62.5-25 MCG/INH AEPB Inhale 1 puff into the lungs daily. Patient not taking: Reported on 10/21/2017 07/28/17   Allyne Gee, MD  ibuprofen (IBU) 400 MG tablet Take 1 tablet (400 mg total) by mouth 3 (three) times daily as needed. Patient not taking: Reported on 10/21/2017 07/11/17   Cammie Sickle, MD  ondansetron (ZOFRAN) 8 MG tablet Take 1 tablet (8 mg total) by mouth every 8 (eight) hours as needed for nausea or vomiting (start 3 days; after chemo). Patient not taking: Reported on 10/21/2017 08/31/17   Cammie Sickle, MD  potassium chloride SA (K-DUR,KLOR-CON) 20 MEQ tablet Take 1 tablet (20 mEq total) by mouth 2 (two) times daily. Patient not taking: Reported on 10/23/2017 09/12/17   Jacquelin Hawking, NP  predniSONE (DELTASONE) 10 MG tablet Take 1 tablet by mouth daily for 3 days and  0.5 tablet until gone. 10/21/17   Jacquelin Hawking, NP  prochlorperazine (COMPAZINE) 10 MG tablet Take 1 tablet (10 mg total) by mouth every 6 (six) hours as needed for nausea or vomiting. Patient not taking: Reported on 10/21/2017 08/31/17   Cammie Sickle, MD   No Known Allergies  FAMILY HISTORY:  family history includes Cancer in her  father; Hypertension in her brother and father; Stroke in her father. SOCIAL HISTORY:  reports that she quit smoking about 12 years ago. Her smoking use included cigarettes. She has a 7.50 pack-year smoking history. She has never used smokeless tobacco. She reports that she drinks alcohol. She reports that she does not use drugs.  REVIEW OF SYSTEMS:   Unable to obtain due to critical illness   VITAL SIGNS: Temp:  [97.6 F (36.4 C)-97.9 F (36.6 C)] 97.6 F (36.4 C) (10/22 0154) Pulse Rate:  [77-110] 110 (10/22 0600) Resp:  [16-29] 29 (10/22 0600) BP: (89-144)/(62-89) 133/83 (10/22 0600) SpO2:  [87 %-99 %] 94 % (10/22 0844) FiO2 (%):  [65 %-69 %] 65 % (10/22 0844)  Physical Examination:  A + O x 3 and no focal neuro deficits On NFNC, no distress, BEAE and bibasilar fine rales S1 & S2 audible and no murmur Benign abdomen with normal peristalses No edema  ASSESSMENT / PLAN:  Acute on chronic respiratory failure tolerating HFNC 60-65%. DNR and awaiting hospice referral -Gentle diuresis as tolerated to improve lung compliance.  Metastatic adenocarcinoma of the lung s/p chemotherapy. -Started on Fentanyl patch for analgesia and continued to require Oxycodone  Infective etiology is less likely( Procal 0.18 and LA < 2) -Consider d/c  ABX if cultures remains -ve.  Hyperglycemia -Glycemic control and optimize steroids  Anemia -Keep HB > 7 gm/dl  Depression and anxiety -Supportive care  DNR  DVT & GI prophylaxis. Continue with supportive care.  Critical care time 35 min

## 2017-10-25 NOTE — Consult Note (Addendum)
Pharmacy Electrolyte Monitoring Consult:  Pharmacy consulted to assist in monitoring and replacing electrolytes in this 62 y.o. female admitted on 10/23/2017 with Shortness of Breath   Labs:  Sodium (mmol/L)  Date Value  10/25/2017 140  01/03/2015 139   Potassium (mmol/L)  Date Value  10/25/2017 3.5   Magnesium (mg/dL)  Date Value  10/25/2017 2.4   Phosphorus (mg/dL)  Date Value  10/25/2017 3.2   Calcium (mg/dL)  Date Value  10/25/2017 8.5 (L)   Albumin (g/dL)  Date Value  10/21/2017 2.9 (L)  01/03/2015 3.8    Assessment/Plan: Electrolytes Goals: Magnesium ~2.0, Potassium ~4.0 Patient was given KCl 40 mEq PO x this AM. Patient also received furosemide 20 mg IV x 1.  Magnesium replacement not warranted at this time.   10/21 K 3.5 Patient received KPhos 500 mg PO x 2 and KCl 20 mEq PO x 2.   Will check electrolytes with AM labs.  Constipation Last BM was 10/22. Started patient's PTA medication of Fentanyl 25 mcg patch on 10/21, with Oxycodone 10 mg IR q4h prn for breakthrough pain. Switched from docusate 100 mg BID to senna-dok 2 tablets BID. Patient is also receiving Miralax.  Pharmacy will continue to monitor and adjust as necessary.  Paticia Stack, PharmD Pharmacy Resident  10/25/2017 10:56 AM

## 2017-10-25 NOTE — Progress Notes (Signed)
Visit made to new referral for [facility] services at home. Writer spoke briefly with patient as she had just awakened and reported "feeling confused", she requested I speak her husband. Patient's husband was unable to meet at that time. Contact number given To Mr. Ranes and he will call to set up a time to discuss services tomorrow. Patient remains on HIflo. Plan is for attempt at weaning oxygen down as this amount cannot be maintained at home. Will continue to follow. Flo Shanks RN, BSN, [facility] [place], hospital Liaison [phone]

## 2017-10-25 NOTE — Progress Notes (Signed)
Pharmacy Antibiotic Note  Deanna Blair is a 62 y.o. female admitted on 10/23/2017 with pneumonia.  Pharmacy has been consulted for Cefepime dosing.  Patient has stage IV adenocarcinoma with brain mets. Was recently hospitalized in [place] ~10 days ago for PNA (unknown antibiotic course).  Plan: Decreased Cefepime to 2 g IV Q12h x 5 days (ends 10/24) per CCM discussion.  Height: 5\' 3"  (160 cm) Weight: 108 lb 0.4 oz (49 kg) IBW/kg (Calculated) : 52.4  Temp (24hrs), Avg:97.7 F (36.5 C), Min:97.5 F (36.4 C), Max:97.9 F (36.6 C)  Recent Labs  Lab 10/21/17 1443 10/23/17 0538 10/23/17 1000 10/24/17 0901 10/25/17 0503  WBC 15.1* 14.0*  --  14.2*  --   CREATININE 0.69 0.63  --  0.55 0.49  LATICACIDVEN  --  1.5 1.7  --   --     Estimated Creatinine Clearance: 56.4 mL/min (by C-G formula based on SCr of 0.49 mg/dL).    No Known Allergies  Antimicrobials this admission: Azithromycin 10/20 >> 10/23 Vancomycin 10/20 >> 10/21 Ceftriaxone 10/20 >> 10/20 Cefepime 10/20 >>10/24  Dose adjustments this admission:  Decreased Cefepime to 2 g IV Q12h from 2 g IV q8h  Microbiology results: 10/20 BCx: NG x 12 days 10/20 MRSA PCR: (-)  Thank you for allowing pharmacy to be a part of this patient's care.  Paticia Stack, PharmD Pharmacy Resident  10/25/2017 10:54 AM

## 2017-10-25 NOTE — Progress Notes (Signed)
   10/25/17 1100  Clinical Encounter Type  Visited With Patient  Visit Type Follow-up (Nortarization of financial documents.)  Recommendations Referred patient to off-campus resources.  Spiritual Encounters  Spiritual Needs Emotional  Stress Factors  Patient Stress Factors Health changes;Financial concerns  Family Stress Factors Financial concerns    Chaplain was paged to assist patient and her husband to notarize personal documents. Chaplain explained that [facility] notaries could not do this for them. Patient asked Chaplain to talk with Elease Etienne at the [facility] about possible assistance. Chaplain went to the [facility] [place] and met with [facility]. He affirmed Chaplain's explanation. Chaplain returned to ICU, shared Crater's response, and suggested that the couple contact their local bank or lawyer for assistance. Patient understood and was appreciative of the effort.

## 2017-10-25 NOTE — Progress Notes (Signed)
Increased to 70% and sats increased to 91%.

## 2017-10-25 NOTE — Progress Notes (Signed)
Pt placed on high flow cannula at 15L, sats 95%, tolerating well at this time. RN and MD notified.

## 2017-10-25 NOTE — Telephone Encounter (Signed)
Patient referred to Hospice from hospital and wants to know if Dr B will serve as attending. OK per PO Dr Rogue Bussing. Order called to [employer] at [facility]

## 2017-10-26 ENCOUNTER — Inpatient Hospital Stay: Payer: BLUE CROSS/BLUE SHIELD

## 2017-10-26 LAB — CBC
HEMATOCRIT: 27.4 % — AB (ref 36.0–46.0)
Hemoglobin: 8.6 g/dL — ABNORMAL LOW (ref 12.0–15.0)
MCH: 32.5 pg (ref 26.0–34.0)
MCHC: 31.4 g/dL (ref 30.0–36.0)
MCV: 103.4 fL — AB (ref 80.0–100.0)
Platelets: 378 10*3/uL (ref 150–400)
RBC: 2.65 MIL/uL — ABNORMAL LOW (ref 3.87–5.11)
RDW: 19.2 % — AB (ref 11.5–15.5)
WBC: 18.3 10*3/uL — AB (ref 4.0–10.5)
nRBC: 0 % (ref 0.0–0.2)

## 2017-10-26 LAB — BASIC METABOLIC PANEL
Anion gap: 8 (ref 5–15)
BUN: 17 mg/dL (ref 8–23)
CHLORIDE: 106 mmol/L (ref 98–111)
CO2: 27 mmol/L (ref 22–32)
CREATININE: 0.6 mg/dL (ref 0.44–1.00)
Calcium: 8.7 mg/dL — ABNORMAL LOW (ref 8.9–10.3)
GFR calc non Af Amer: >60 mL/min (ref >60)
GLUCOSE: 157 mg/dL — AB (ref 70–99)
Potassium: 3.9 mmol/L (ref 3.5–5.1)
Sodium: 141 mmol/L (ref 135–145)

## 2017-10-26 LAB — CALCIUM, IONIZED: Calcium, Ionized, Serum: 5.1 mg/dL (ref 4.5–5.6)

## 2017-10-26 MED ORDER — FUROSEMIDE 40 MG PO TABS
40.0000 mg | ORAL_TABLET | Freq: Two times a day (BID) | ORAL | Status: DC
  Administered 2017-10-26 – 2017-10-28 (×4): 40 mg via ORAL
  Filled 2017-10-26: qty 2
  Filled 2017-10-26: qty 1
  Filled 2017-10-26: qty 2
  Filled 2017-10-26: qty 1

## 2017-10-26 MED ORDER — BOOST / RESOURCE BREEZE PO LIQD CUSTOM
1.0000 | Freq: Three times a day (TID) | ORAL | Status: DC
  Administered 2017-10-26 – 2017-10-28 (×4): 1 via ORAL

## 2017-10-26 MED ORDER — CLONAZEPAM 0.5 MG PO TABS
0.5000 mg | ORAL_TABLET | Freq: Two times a day (BID) | ORAL | Status: DC
  Administered 2017-10-26 – 2017-10-28 (×5): 0.5 mg via ORAL
  Filled 2017-10-26 (×5): qty 1

## 2017-10-26 MED ORDER — METHYLPREDNISOLONE SODIUM SUCC 40 MG IJ SOLR
40.0000 mg | Freq: Three times a day (TID) | INTRAMUSCULAR | Status: DC
  Administered 2017-10-26 – 2017-10-28 (×6): 40 mg via INTRAVENOUS
  Filled 2017-10-26 (×6): qty 1

## 2017-10-26 NOTE — Consult Note (Signed)
Pharmacy Electrolyte Monitoring Consult:  Pharmacy consulted to assist in monitoring and replacing electrolytes in this 62 y.o. female admitted on 10/23/2017 with Shortness of Breath   Labs:  Sodium (mmol/L)  Date Value  10/26/2017 141  01/03/2015 139   Potassium (mmol/L)  Date Value  10/26/2017 3.9   Magnesium (mg/dL)  Date Value  10/25/2017 2.4   Phosphorus (mg/dL)  Date Value  10/25/2017 3.2   Calcium (mg/dL)  Date Value  10/26/2017 8.7 (L)   Albumin (g/dL)  Date Value  10/21/2017 2.9 (L)  01/03/2015 3.8    Assessment/Plan: Electrolytes Goals: Magnesium ~2.0, Potassium ~4.0  Electrolyte replacement not warranted at this time.    Will check electrolytes with AM labs.  Constipation Last BM was 10/22. Started patient's PTA medication of Fentanyl 25 mcg patch on 10/21, with Oxycodone 10 mg IR q4h prn for breakthrough pain. Patient is comfortable pain-wise per CCM rounds' discussion.  Patient receiving docusate 100 mg BID, senna-dok 2 tablets BID, and Miralax.  Pharmacy will continue to monitor and adjust as necessary.  Paticia Stack, PharmD Pharmacy Resident  10/26/2017 11:34 AM

## 2017-10-26 NOTE — Progress Notes (Signed)
[place] at [facility]                                                                                                                                                                                  Patient Demographics   Deanna Blair, is a 62 y.o. female, DOB - [birth year], ZOX:[id number]  Admit date - 10/23/2017   Admitting Physician Dustin Flock, MD  Outpatient Primary MD for the patient is Mar Daring, PA-C   LOS - 3  Subjective: Patient continues to require high flow oxygen   Review of Systems:   CONSTITUTIONAL: No documented fever. No fatigue, weakness. No weight gain, no weight loss.  EYES: No blurry or double vision.  ENT: No tinnitus. No postnasal drip. No redness of the oropharynx.  RESPIRATORY: No cough, no wheeze, no hemoptysis.  Positive dyspnea.  CARDIOVASCULAR: No chest pain. No orthopnea. No palpitations. No syncope.  GASTROINTESTINAL: No nausea, no vomiting or diarrhea. No abdominal pain. No melena or hematochezia.  GENITOURINARY: No dysuria or hematuria.  ENDOCRINE: No polyuria or nocturia. No heat or cold intolerance.  HEMATOLOGY: No anemia. No bruising. No bleeding.  INTEGUMENTARY: No rashes. No lesions.  MUSCULOSKELETAL: No arthritis. No swelling. No gout.  NEUROLOGIC: No numbness, tingling, or ataxia. No seizure-type activity.  PSYCHIATRIC: No anxiety. No insomnia. No ADD.    Vitals:   Vitals:   10/26/17 1000 10/26/17 1200 10/26/17 1220 10/26/17 1300  BP: 126/73 128/73  122/75  Pulse: (!) 124 (!) 116  (!) 116  Resp: (!) 26 (!) 23  (!) 25  Temp:   98.2 F (36.8 C)   TempSrc:   Oral   SpO2: 91% 93%  98%  Weight:      Height:        Wt Readings from Last 3 Encounters:  10/23/17 49 kg  10/17/17 46 kg  09/21/17 49 kg     Intake/Output Summary (Last 24 hours) at 10/26/2017 1453 Last data filed at 10/26/2017 0954 Gross per 24 hour  Intake 100 ml  Output 1200 ml  Net -1100 ml    Physical Exam:    GENERAL: Mild respiratory distress HEAD, EYES, EARS, NOSE AND THROAT: Atraumatic, normocephalic. Extraocular muscles are intact. Pupils equal and reactive to light. Sclerae anicteric. No conjunctival injection. No oro-pharyngeal erythema.  NECK: Supple. There is no jugular venous distention. No bruits, no lymphadenopathy, no thyromegaly.  HEART: Regular rate and rhythm,. No murmurs, no rubs, no clicks.  LUNGS: Diminished breath sounds bilaterally.  ABDOMEN: Soft, flat, nontender, nondistended. Has good bowel sounds. No hepatosplenomegaly appreciated.  EXTREMITIES: No evidence of any cyanosis, clubbing, or peripheral edema.  +2 pedal and radial  pulses bilaterally.  NEUROLOGIC: The patient is alert, awake, and oriented x3 with no focal motor or sensory deficits appreciated bilaterally.  SKIN: Moist and warm with no rashes appreciated.  Psych: Not anxious, depressed LN: No inguinal LN enlargement    Antibiotics   Anti-infectives (From admission, onward)   Start     Dose/Rate Route Frequency Ordered Stop   10/25/17 1800  ceFEPIme (MAXIPIME) 2 g in sodium chloride 0.9 % 100 mL IVPB  Status:  Discontinued     2 g 200 mL/hr over 30 Minutes Intravenous Every 12 hours 10/25/17 1053 10/26/17 1026   10/25/17 1000  azithromycin (ZITHROMAX) 500 mg in sodium chloride 0.9 % 250 mL IVPB     500 mg 250 mL/hr over 60 Minutes Intravenous Every 24 hours 10/24/17 0912 10/25/17 1040   10/23/17 1800  vancomycin (VANCOCIN) IVPB 750 mg/150 ml premix  Status:  Discontinued     750 mg 150 mL/hr over 60 Minutes Intravenous Every 12 hours 10/23/17 0959 10/24/17 1011   10/23/17 1400  ceFEPIme (MAXIPIME) 2 g in sodium chloride 0.9 % 100 mL IVPB  Status:  Discontinued     2 g 200 mL/hr over 30 Minutes Intravenous Every 8 hours 10/23/17 0959 10/25/17 1053   10/23/17 0645  vancomycin (VANCOCIN) IVPB 1000 mg/200 mL premix     1,000 mg 200 mL/hr over 60 Minutes Intravenous  Once 10/23/17 0632 10/23/17 0848    10/23/17 0545  cefTRIAXone (ROCEPHIN) 2 g in sodium chloride 0.9 % 100 mL IVPB  Status:  Discontinued     2 g 200 mL/hr over 30 Minutes Intravenous Every 24 hours 10/23/17 0539 10/23/17 1002   10/23/17 0545  azithromycin (ZITHROMAX) 500 mg in sodium chloride 0.9 % 250 mL IVPB  Status:  Discontinued     500 mg 250 mL/hr over 60 Minutes Intravenous Every 24 hours 10/23/17 0539 10/24/17 0912      Medications   Scheduled Meds: . budesonide (PULMICORT) nebulizer solution  0.25 mg Nebulization BID  . clonazePAM  0.5 mg Oral BID  . enoxaparin (LOVENOX) injection  40 mg Subcutaneous Q24H  . famotidine  20 mg Oral BID  . fentaNYL  25 mcg Transdermal Q72H  . furosemide  40 mg Oral BID  . Influenza vac split quadrivalent PF  0.5 mL Intramuscular Tomorrow-1000  . ipratropium-albuterol  3 mL Nebulization Q4H  . methylPREDNISolone (SOLU-MEDROL) injection  40 mg Intravenous Q8H  . polyethylene glycol  17 g Oral Daily  . senna-docusate  2 tablet Oral BID   Continuous Infusions:  PRN Meds:.acetaminophen **OR** acetaminophen, LORazepam, ondansetron **OR** ondansetron (ZOFRAN) IV, oxyCODONE   Data Review:   Micro Results Recent Results (from the past 240 hour(s))  Blood Culture (routine x 2)     Status: None (Preliminary result)   Collection Time: 10/23/17  5:45 AM  Result Value Ref Range Status   Specimen Description BLOOD BLOOD LEFT FOREARM  Final   Special Requests   Final    BOTTLES DRAWN AEROBIC AND ANAEROBIC Blood Culture results may not be optimal due to an inadequate volume of blood received in culture bottles   Culture   Final    NO GROWTH 3 DAYS Performed at [facility], [address] [place], [place] [address]    Report Status PENDING  Incomplete  Blood Culture (routine x 2)     Status: None (Preliminary result)   Collection Time: 10/23/17  5:45 AM  Result Value Ref Range Status   Specimen Description  BLOOD BLOOD RIGHT FOREARM  Final   Special Requests   Final     BOTTLES DRAWN AEROBIC AND ANAEROBIC Blood Culture adequate volume   Culture   Final    NO GROWTH 3 DAYS Performed at [facility], [address] [place], [place] [address]    Report Status PENDING  Incomplete  MRSA PCR Screening     Status: None   Collection Time: 10/23/17  9:47 AM  Result Value Ref Range Status   MRSA by PCR NEGATIVE NEGATIVE Final    Comment:        The GeneXpert MRSA Assay (FDA approved for NASAL specimens only), is one component of a comprehensive MRSA colonization surveillance program. It is not intended to diagnose MRSA infection nor to guide or monitor treatment for MRSA infections. Performed at [facility], [address] [place], [place] [address]     Radiology Reports Ct Chest Wo Contrast  Result Date: 10/26/2017 CLINICAL DATA:  Inpatient. Acute on chronic respiratory failure. Metastatic right lung adenocarcinoma. EXAM: CT CHEST WITHOUT CONTRAST TECHNIQUE: Multidetector CT imaging of the chest was performed following the standard protocol without IV contrast. COMPARISON:  09/20/2017 chest CT. Chest radiograph from one day prior. FINDINGS: Cardiovascular: Normal heart size. No significant pericardial effusion/thickening. Left anterior descending coronary atherosclerosis. Right internal jugular MediPort terminates at the cavoatrial junction. Atherosclerotic nonaneurysmal thoracic aorta. Normal caliber pulmonary arteries. Mediastinum/Nodes: No discrete thyroid nodules. Unremarkable esophagus. No axillary adenopathy. No pathologically enlarged mediastinal nodes. No discrete hilar adenopathy on this noncontrast scan. Lungs/Pleura: No pneumothorax. Trace dependent bilateral pleural effusions, new bilaterally. There is extensive patchy consolidation and ground-glass opacity throughout the right greater than left lungs with extensive cavitary change throughout the right lower lobe. These findings involve all of the lung lobes and have  substantially worsened throughout both lungs, most notably in the left lung with new involvement of the left lower lobe and significantly increased involvement of the left upper lobe. Upper abdomen: No acute abnormality. Musculoskeletal:  No aggressive appearing focal osseous lesions. IMPRESSION: 1. Extensive patchy consolidation and ground-glass opacity throughout the right greater than left lungs involving all lung lobes with extensive cavitary change in the right lower lobe. Findings have substantially worsened since 09/20/2017 chest CT, most notably in the left lung. Findings are most suggestive of continued progression of multilobar lung adenocarcinoma, although a superimposed process such as diffuse alveolar hemorrhage or an inflammatory process such as acute interstitial pneumonia/ARDS, atypical pneumonia, drug reaction or hypersensitivity pneumonitis cannot be excluded. 2. New trace dependent bilateral pleural effusions. 3. One vessel coronary atherosclerosis. Aortic Atherosclerosis (ICD10-I70.0). Electronically Signed   By: Ilona Sorrel M.D.   On: 10/26/2017 13:31   Dg Chest Port 1 View  Result Date: 10/25/2017 CLINICAL DATA:  Congestive heart failure. EXAM: PORTABLE CHEST 1 VIEW COMPARISON:  Radiograph of October 24, 2017. FINDINGS: Stable cardiomediastinal silhouette. Stable bilateral lung opacities are noted concerning for inflammation and possible associated chronic disease. No pneumothorax or large pleural effusion is noted. Right internal jugular Port-A-Cath is unchanged compared to prior exam. IMPRESSION: Stable bilateral lung opacities as described above. Electronically Signed   By: Marijo Conception, M.D.   On: 10/25/2017 07:27   Dg Chest Port 1 View  Result Date: 10/24/2017 CLINICAL DATA:  62 year old female with acute respiratory failure. Subsequent encounter. EXAM: PORTABLE CHEST 1 VIEW COMPARISON:  10/23/2017 chest x-ray.  09/20/2017 chest CT. FINDINGS: Diffuse asymmetric airspace  disease greater on the right similar to prior chest x-ray. Question  infectious infiltrate superimposed upon chronic changes as versus result of multilobar lung adenocarcinoma as suggested on 09/20/2017 chest CT. MediPort catheter tip caval atrial junction level. No pneumothorax noted. Cardiomegaly. IMPRESSION: 1. No significant change in appearance of diffuse asymmetric airspace disease greater on the right. Question infectious infiltrate superimposed upon chronic changes as versus result of multilobar lung adenocarcinoma as suggested on 09/20/2017 chest CT. 2. Cardiomegaly. 3.  Aortic Atherosclerosis (ICD10-I70.0). Electronically Signed   By: Genia Del M.D.   On: 10/24/2017 07:31   Dg Chest Port 1 View  Result Date: 10/23/2017 CLINICAL DATA:  Shortness of breath EXAM: PORTABLE CHEST 1 VIEW COMPARISON:  Chest CT 09/20/2017 FINDINGS: Extensive reticular opacities throughout the right lung are compatible with the findings of the chest CT 09/20/2017, but have progressed since that time. The left lung remains relatively clear. Right chest wall Port-A-Cath tip is at the cavoatrial junction.Small right pleural effusion. IMPRESSION: Chronic right lung reticular opacities, which have progressed compared to the radiograph 09/15/2017. Small right pleural effusion. Electronically Signed   By: Ulyses Jarred M.D.   On: 10/23/2017 06:14     CBC Recent Labs  Lab 10/21/17 1443 10/23/17 0538 10/24/17 0901 10/26/17 0727  WBC 15.1* 14.0* 14.2* 18.3*  HGB 9.5* 9.5* 8.0* 8.6*  HCT 29.6* 30.3* 25.0* 27.4*  PLT 308 362 309 378  MCV 101.7* 104.1* 104.2* 103.4*  MCH 32.6 32.6 33.3 32.5  MCHC 32.1 31.4 32.0 31.4  RDW 19.3* 19.2* 19.1* 19.2*  LYMPHSABS 0.3* 1.3  --   --   MONOABS 0.4 0.8  --   --   EOSABS 0.0 0.5  --   --   BASOSABS 0.0 0.0  --   --     Chemistries  Recent Labs  Lab 10/21/17 1443 10/23/17 0538 10/24/17 0901 10/25/17 0503 10/26/17 0727  NA 133* 136 137 140 141  K 3.5 3.3* 3.6 3.5 3.9   CL 95* 99 106 109 106  CO2 27 24 23 24 27   GLUCOSE 246* 139* 176* 181* 157*  BUN 16 11 10 12 17   CREATININE 0.69 0.63 0.55 0.49 0.60  CALCIUM 8.9 8.8* 8.1* 8.5* 8.7*  MG  --   --  1.5* 2.4  --   AST 34  --   --   --   --   ALT 20  --   --   --   --   ALKPHOS 59  --   --   --   --   BILITOT 0.5  --   --   --   --    ------------------------------------------------------------------------------------------------------------------ estimated creatinine clearance is 56.4 mL/min (by C-G formula based on SCr of 0.6 mg/dL). ------------------------------------------------------------------------------------------------------------------ No results for input(s): HGBA1C in the last 72 hours. ------------------------------------------------------------------------------------------------------------------ No results for input(s): CHOL, HDL, LDLCALC, TRIG, CHOLHDL, LDLDIRECT in the last 72 hours. ------------------------------------------------------------------------------------------------------------------ No results for input(s): TSH, T4TOTAL, T3FREE, THYROIDAB in the last 72 hours.  Invalid input(s): FREET3 ------------------------------------------------------------------------------------------------------------------ No results for input(s): VITAMINB12, FOLATE, FERRITIN, TIBC, IRON, RETICCTPCT in the last 72 hours.  Coagulation profile No results for input(s): INR, PROTIME in the last 168 hours.  No results for input(s): DDIMER in the last 72 hours.  Cardiac Enzymes Recent Labs  Lab 10/23/17 0538  TROPONINI <0.03   ------------------------------------------------------------------------------------------------------------------ Invalid input(s): POCBNP    Assessment & Plan   IMPRESSION AND PLAN: Patient is 62 year old with adenocarcinoma of the lung presenting with shortness of breath  1.  Acute respiratory failure: CT scan confirms this finding  This is related to  progression of her lung cancer continue supportive care we will try to decrease oxygen requirement for discharge to home with home hospice  2.  Sepsis based on tachycardia, leukocytosis, elevated respiratory rate Continue antibiotics as above  3.  Adenocarcinoma based on CT scan in September  Seen by oncology  4.  Anxiety continue home medication  5.  Miscellaneous Lovenox for DVT prophylaxis     Code Status Orders  (From admission, onward)         Start     Ordered   10/23/17 0852  Do not attempt resuscitation (DNR)  Continuous    Question Answer Comment  In the event of cardiac or respiratory ARREST Do not call a "code blue"   In the event of cardiac or respiratory ARREST Do not perform Intubation, CPR, defibrillation or ACLS   In the event of cardiac or respiratory ARREST Use medication by any route, position, wound care, and other measures to relive pain and suffering. May use oxygen, suction and manual treatment of airway obstruction as needed for comfort.      10/23/17 0851        Code Status History    Date Active Date Inactive Code Status Order ID Comments User Context   06/21/2016 1751 06/23/2016 1653 Full Code [id number]  Nicholes Mango, MD Inpatient    Advance Directive Documentation     Most Recent Value  Type of Advance Directive  Out of facility DNR (pink MOST or yellow form)  Pre-existing out of facility DNR order (yellow form or pink MOST form)  Yellow form placed in chart (order not valid for inpatient use)  "MOST" Form in Place?  -           Consults oncology   DVT Prophylaxis  Lovenox   Lab Results  Component Value Date   PLT 378 10/26/2017     Time Spent in minutes   27min  Greater than 50% of time spent in care coordination and counseling patient regarding the condition and plan of care.   Dustin Flock M.D on 10/26/2017 at 2:53 PM  Between 7am to 6pm - Pager - [phone]  After 6pm go to [email or web address] - [employer]  [employer]   Office  [phone]

## 2017-10-26 NOTE — Progress Notes (Signed)
[facility]  [phone] [phone]   Name: Deanna Blair Date: 10/26/2017 MRN: [id number]  DOB: 07-Apr-1955  Patient Care Team: Mar Daring, PA-C as PCP - General (Family Medicine) Allyne Gee, MD as Referring Physician (Internal Medicine)    REASON FOR CONSULTATION: Palliative Care consult requested for this 62 y.o. 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  PMH also notable for anxiety, depression.  Patient was recently hospitalized in [place] with acute on chronic respiratory failure and discharged about 10 days prior to readmission at [facility] on 10/24/2017 with same.  Initially respiratory distress required BiPAP and then high flow nasal cannula.  Chest x-ray suggests diffuse asymmetric airspace disease greater on the right with infectious differential versus multilobar lung adenocarcinoma.  Palliative care has been asked to help address symptoms and goals.   CODE STATUS: DNR  PAST MEDICAL HISTORY: Past Medical History:  Diagnosis Date  . Anxiety   . Cancer of right lung ([place]) 02/13/2015, 01/13/17  . Depression   . Depression with anxiety    Well controlled with Wellbutrin and Buspar  . Pneumonia     PAST SURGICAL HISTORY:  Past Surgical History:  Procedure Laterality Date  . ECTOPIC PREGNANCY SURGERY  1988  . ELECTROMAGNETIC NAVIGATION BROCHOSCOPY N/A 02/10/2015   Procedure: ELECTROMAGNETIC NAVIGATION BRONCHOSCOPY;  Surgeon: Flora Lipps, MD;  Location: [facility] ORS;  Service: Cardiopulmonary;  Laterality: N/A;  . ENDOBRONCHIAL ULTRASOUND N/A 02/10/2015   Procedure: ENDOBRONCHIAL ULTRASOUND;  Surgeon: Flora Lipps, MD;  Location: [facility] ORS;  Service: Cardiopulmonary;  Laterality: N/A;  . PERIPHERAL VASCULAR CATHETERIZATION N/A 03/05/2015   Procedure: Glori Luis Cath Insertion;  Surgeon: Katha Cabal, MD;  Location: [place] [facility];  Service: Cardiovascular;  Laterality: N/A;    HEMATOLOGY/ONCOLOGY HISTORY:  Oncology History   # FEB 2017-ADENO CA Right Lower Lung T4N2M0- STAGE IIIB [Bronch & Subcarinal LNBx]- March 8th START CARBO-ALIMTA x4 cycles- June 8th 2017 - IMPROVED FDG MULTI-FOCAL lesions/N2-LN activity. S/p [place]-- Alimta x6  # AUG 18th- Alimta- Avastin maintenance; SEP 13th DISCONTINUE AVASTIN [sec to cavitary lesion]; OCT 20th 2017- CT-STABLE Disease; NOV 8th CT/PET [facility]]- "overall slight progression-? Lymphangiectatic spread"  # Jan - April 2018Clement Husbands; progression  # April 25th 2018- [place]- Taxol x3 ccyles; June 2018- CT chest STABLE Disease;July 25th- Add Avastin to [place] taxol x6 cycles- Sep 2018-CT scan- [facility] STABLE   # Sep 2018- Avastin Maintence;   JAN 9th CT- right lung progression [s/p Bx- [place]; [place] [facility]; F-one- 1/15]  # NOV 2nd 2017-MRI, [facility]- Brain mets [asymptomatic;Right frontal ~61m; ~376mlesions -appx 3-4 lesions; (right Frontal & Left parietal s/p GK x 2 lesions] ; June 2018- Progession vs radiation necrosis [July 2th-Add Avastin]  # [facility] 4 small subcentimeter brain lesions for which she had the Gamma knife. The main right frontal lesion gotten smaller; improve edema.  # Jan 2019- Progression [CT chest; [facility]]; Brain MRI- new sub cm lesions- monitored  # feb 14th 2019- Tax-Cyram  # AUG 28th 2019- Gem   --------------------------------------------------------------     MOLECULAR STUDIES:  KRAS MUTATED/Tissues not sufficient for OTHER the molecular marker; will need repeat Bx ; GUARDIANT TESTING [[facility]]- pending. [phone] No targettable mutations.  ------------------------------------------------------  DIAGNOSIS: [ FEB 2017]- ADENO CA LUNG  STAGE:   IV   ;GOALS: palliative  CURRENT/MOST RECENT THERAPY- Gem      Primary cancer of right lower lobe of lung (HCC)    ALLERGIES:  has No Known Allergies.  MEDICATIONS:  Current Facility-Administered Medications  Medication Dose Route Frequency Provider Last Rate Last Dose  . acetaminophen (TYLENOL) tablet 650 mg  650 mg Oral Q6H PRN Dustin Flock, MD       Or  . acetaminophen (TYLENOL) suppository 650 mg  650 mg Rectal Q6H PRN Dustin Flock, MD      . budesonide (PULMICORT) nebulizer solution 0.25 mg  0.25 mg Nebulization BID Dustin Flock, MD   0.25 mg at 10/26/17 0717  . clonazePAM (KLONOPIN) tablet 0.5 mg  0.5 mg Oral BID Dustin Flock, MD   0.5 mg at 10/26/17 1058  . enoxaparin (LOVENOX) injection 40 mg  40 mg Subcutaneous Q24H Cyndee Brightly M, [place]   40 mg at 10/25/17 2016  . famotidine (PEPCID) tablet 20 mg  20 mg Oral BID Cassandria Santee, MD   20 mg at 10/26/17 1057  . fentaNYL (DURAGESIC - dosed mcg/hr) patch 25 mcg  25 mcg Transdermal Q72H Cassandria Santee, MD   25 mcg at 10/24/17 1442  . Influenza vac split quadrivalent PF (FLUARIX) injection 0.5 mL  0.5 mL Intramuscular Tomorrow-1000 Tyler Pita, MD      . ipratropium-albuterol (DUONEB) 0.5-2.5 (3) MG/3ML nebulizer solution 3 mL  3 mL Nebulization Q4H Dustin Flock, MD   3 mL at 10/26/17 0717  . LORazepam (ATIVAN) tablet 1 mg  1 mg Oral Q6H PRN Tyler Pita, MD   1 mg at 10/26/17 0602  . methylPREDNISolone sodium succinate (SOLU-MEDROL) 125 mg/2 mL injection 60 mg  60 mg Intravenous Q8H Samaan, Maged, MD   60 mg at 10/26/17 0602  . ondansetron (ZOFRAN) tablet 4 mg  4 mg Oral Q6H PRN Dustin Flock, MD       Or  . ondansetron [facility]) injection 4 mg  4 mg Intravenous Q6H PRN Dustin Flock, MD      . oxyCODONE (Oxy IR/ROXICODONE) immediate release tablet 10 mg  10 mg Oral Q4H PRN Cassandria Santee, MD   10 mg at 10/26/17 0815  . polyethylene glycol (MIRALAX / GLYCOLAX) packet 17 g  17 g Oral Daily Samaan, Maged, MD    17 g at 10/26/17 1058  . senna-docusate (Senokot-S) tablet 2 tablet  2 tablet Oral BID Dustin Flock, MD   2 tablet at 10/26/17 1057   Facility-Administered Medications Ordered in Other Encounters  Medication Dose Route Frequency Provider Last Rate Last Dose  . 0.9 %  sodium chloride infusion   Intravenous Continuous Faythe Casa E, NP      . 0.9 %  sodium chloride infusion   Intravenous Continuous Burns, Wandra Feinstein, NP      . dexamethasone (DECADRON) injection 10 mg  10 mg Intravenous Once Faythe Casa E, NP      . heparin lock flush 100 unit/mL  500 Units Intravenous Once Cammie Sickle, MD        VITAL SIGNS: BP 126/73   Pulse (!) 124   Temp 98.3 F (36.8 C) (Oral)   Resp (!) 26   Ht 5' 3"  (1.6 m)   Wt 108 lb 0.4 oz (49 kg)   LMP 06/16/2000 (Approximate) Comment: 15 yrs ago  SpO2 91%   BMI 19.14 kg/m  Filed Weights   10/23/17 0535 10/23/17  3559  Weight: 101 lb 6.2 oz (46 kg) 108 lb 0.4 oz (49 kg)    Estimated body mass index is 19.14 kg/m as calculated from the following:   Height as of this encounter: 5' 3"  (1.6 m).   Weight as of this encounter: 108 lb 0.4 oz (49 kg).  LABS: CBC:    Component Value Date/Time   WBC 18.3 (H) 10/26/2017 0727   HGB 8.6 (L) 10/26/2017 0727   HGB 11.4 01/03/2015 0910   HCT 27.4 (L) 10/26/2017 0727   HCT 34.1 01/03/2015 0910   PLT 378 10/26/2017 0727   PLT 803 (HH) 12/20/2014 0948   MCV 103.4 (H) 10/26/2017 0727   MCV 94 01/03/2015 0910   NEUTROABS 11.4 (H) 10/23/2017 0538   NEUTROABS 4.3 01/03/2015 0910   LYMPHSABS 1.3 10/23/2017 0538   LYMPHSABS 1.0 01/03/2015 0910   MONOABS 0.8 10/23/2017 0538   EOSABS 0.5 10/23/2017 0538   EOSABS 0.2 01/03/2015 0910   BASOSABS 0.0 10/23/2017 0538   BASOSABS 0.0 01/03/2015 0910   Comprehensive Metabolic Panel:    Component Value Date/Time   NA 141 10/26/2017 0727   NA 139 01/03/2015 0910   K 3.9 10/26/2017 0727   CL 106 10/26/2017 0727   CO2 27 10/26/2017 0727   BUN 17  10/26/2017 0727   BUN 10 01/03/2015 0910   CREATININE 0.60 10/26/2017 0727   GLUCOSE 157 (H) 10/26/2017 0727   CALCIUM 8.7 (L) 10/26/2017 0727   AST 34 10/21/2017 1443   ALT 20 10/21/2017 1443   ALKPHOS 59 10/21/2017 1443   BILITOT 0.5 10/21/2017 1443   BILITOT <0.2 01/03/2015 0910   PROT 6.3 (L) 10/21/2017 1443   PROT 6.5 01/03/2015 0910   ALBUMIN 2.9 (L) 10/21/2017 1443   ALBUMIN 3.8 01/03/2015 0910    RADIOGRAPHIC STUDIES: Dg Chest Port 1 View  Result Date: 10/25/2017 CLINICAL DATA:  Congestive heart failure. EXAM: PORTABLE CHEST 1 VIEW COMPARISON:  Radiograph of October 24, 2017. FINDINGS: Stable cardiomediastinal silhouette. Stable bilateral lung opacities are noted concerning for inflammation and possible associated chronic disease. No pneumothorax or large pleural effusion is noted. Right internal jugular Port-A-Cath is unchanged compared to prior exam. IMPRESSION: Stable bilateral lung opacities as described above. Electronically Signed   By: Marijo Conception, M.D.   On: 10/25/2017 07:27   Dg Chest Port 1 View  Result Date: 10/24/2017 CLINICAL DATA:  62 year old female with acute respiratory failure. Subsequent encounter. EXAM: PORTABLE CHEST 1 VIEW COMPARISON:  10/23/2017 chest x-ray.  09/20/2017 chest CT. FINDINGS: Diffuse asymmetric airspace disease greater on the right similar to prior chest x-ray. Question infectious infiltrate superimposed upon chronic changes as versus result of multilobar lung adenocarcinoma as suggested on 09/20/2017 chest CT. MediPort catheter tip caval atrial junction level. No pneumothorax noted. Cardiomegaly. IMPRESSION: 1. No significant change in appearance of diffuse asymmetric airspace disease greater on the right. Question infectious infiltrate superimposed upon chronic changes as versus result of multilobar lung adenocarcinoma as suggested on 09/20/2017 chest CT. 2. Cardiomegaly. 3.  Aortic Atherosclerosis (ICD10-I70.0). Electronically Signed   By:  Genia Del M.D.   On: 10/24/2017 07:31   Dg Chest Port 1 View  Result Date: 10/23/2017 CLINICAL DATA:  Shortness of breath EXAM: PORTABLE CHEST 1 VIEW COMPARISON:  Chest CT 09/20/2017 FINDINGS: Extensive reticular opacities throughout the right lung are compatible with the findings of the chest CT 09/20/2017, but have progressed since that time. The left lung remains relatively clear. Right chest wall Port-A-Cath tip is  at the cavoatrial junction.Small right pleural effusion. IMPRESSION: Chronic right lung reticular opacities, which have progressed compared to the radiograph 09/15/2017. Small right pleural effusion. Electronically Signed   By: Ulyses Jarred M.D.   On: 10/23/2017 06:14    PERFORMANCE STATUS (ECOG) : 4 - Bedbound  Review of Systems As noted above. Otherwise, a complete review of systems is negative.  Physical Exam General: frail appearing, thin Cardiovascular: regular rate and rhythm Pulmonary: Poor air movement ant fields, dyspnea on exertion, on high flow Abdomen: soft, nontender, + bowel sounds Extremities: no edema, no joint deformities Skin: no rashes Neurological: Weakness but otherwise nonfocal  IMPRESSION: She remains in ICU on high flow nasal cannula.  FiO2 of 60 percent on 50 L.  Reportedly patient tolerated weaning yesterday down to 15 L but did not sustain.  She became hypoxic with minimal exertion.  Case discussed with pulmonology and oncology.  Plan for continued weaning of O2 as patient tolerates.  Would wean O2 to keep sats above 90%.  She is currently at 98%.  RN will contact RT to see if she can be weaned.  Note plan to obtain CT today.  Plan is still for patient to discharge home on under hospice care.  However, she will need to be on a max of 15 L O2 for this to occur.  Agree with change to Klonopin twice daily.  Would continue to titrate benzodiazepines and opioids to maximize symptom relief and control anxiety and dyspnea.  PLAN: Continue  supportive care Plan for eventual discharge home with hospice Will need continued weaning from high flow nasal cannula  Time Total: 15 minutes  Visit consisted of counseling and education dealing with the complex and emotionally intense issues of symptom management and palliative care in the setting of serious and potentially life-threatening illness.Greater than 50%  of this time was spent counseling and coordinating care related to the above assessment and plan.  Signed by: Altha Harm, [place], NP-C, [place] (Work Cell)

## 2017-10-26 NOTE — Progress Notes (Signed)
Call made to patient's husband as he had not reached out to writer today. Plan to meet tomorrow morning between 10-10:30 to review hospice services and determine DME needs. CMRN Pryor Montes made aware.  Flo Shanks RN, BSN, [facility] [place], hospital liaison [phone]

## 2017-10-26 NOTE — Progress Notes (Signed)
Name: Deanna Blair MRN: [id number] DOB: 13-Jan-1955     CONSULTATION DATE: 10/23/2017  Subjective & Objectives: Responded to diuresis, remains on HFNC and awaiting home hospice  PAST MEDICAL HISTORY :   has a past medical history of Anxiety, Cancer of right lung ([place]) (02/13/2015, 01/13/17), Depression, Depression with anxiety, and Pneumonia.  has a past surgical history that includes Ectopic pregnancy surgery (1988); Endobronchial ultrasound (N/A, 02/10/2015); Electormagnetic navigation bronchoscopy (N/A, 02/10/2015); and Cardiac catheterization (N/A, 03/05/2015). Prior to Admission medications   Medication Sig Start Date End Date Taking? Authorizing Provider  acetaminophen (TYLENOL) 500 MG tablet Take 1,000 mg by mouth every 4 (four) hours as needed for mild pain or moderate pain.    Yes [provider]  albuterol (PROVENTIL) (2.5 MG/3ML) 0.083% nebulizer solution Take 2.5 mg by nebulization every 6 (six) hours as needed for wheezing.  10/12/17  Yes [provider]  BEVESPI AEROSPHERE 9-4.8 MCG/ACT AERO Inhale 2 puffs into the lungs 2 (two) times daily. 10/12/17  Yes [provider]  docusate sodium (COLACE) 100 MG capsule Take 100 mg by mouth 2 (two) times daily.   Yes [provider]  lidocaine-prilocaine (EMLA) cream Apply 1 application topically as needed. 12/22/16  Yes Cammie Sickle, MD  LORazepam (ATIVAN) 0.5 MG tablet Take 1 tablet (0.5 mg total) by mouth 2 (two) times daily. As needed for anxiety/ nausea 08/24/17  Yes Cammie Sickle, MD  Oxycodone HCl 10 MG TABS Take 1 tablet (10 mg total) by mouth every 6 (six) hours as needed (moderate to severe pain). 09/21/17  Yes Cammie Sickle, MD  polyethylene glycol (MIRALAX / GLYCOLAX) packet Take 17 g by mouth daily as needed for mild constipation or moderate constipation.    Yes [provider]  VENTOLIN HFA 108 (90 Base) MCG/ACT inhaler Inhale 1 puff into the lungs every 6  (six) hours as needed for wheezing or shortness of breath.  10/12/17  Yes [provider]  chlorpheniramine-HYDROcodone (TUSSIONEX) 10-8 MG/5ML SUER Take 5 mLs by mouth 2 (two) times daily. Patient not taking: Reported on 10/21/2017 09/21/17   Cammie Sickle, MD  dexamethasone (DECADRON) 4 MG tablet Take 1 tablet (4 mg total) by mouth 2 (two) times daily with a meal. Start the day prior to chemo; for 3 days. Patient not taking: Reported on 10/21/2017 02/14/17   Cammie Sickle, MD  fentaNYL (DURAGESIC - DOSED MCG/HR) 25 MCG/HR patch Place 1 patch (25 mcg total) onto the skin every 3 (three) days. Patient not taking: Reported on 10/21/2017 09/09/17   Cammie Sickle, MD  Fluticasone-Umeclidin-Vilant (TRELEGY ELLIPTA) 100-62.5-25 MCG/INH AEPB Inhale 1 puff into the lungs daily. Patient not taking: Reported on 10/21/2017 07/28/17   Allyne Gee, MD  ibuprofen (IBU) 400 MG tablet Take 1 tablet (400 mg total) by mouth 3 (three) times daily as needed. Patient not taking: Reported on 10/21/2017 07/11/17   Cammie Sickle, MD  ondansetron (ZOFRAN) 8 MG tablet Take 1 tablet (8 mg total) by mouth every 8 (eight) hours as needed for nausea or vomiting (start 3 days; after chemo). Patient not taking: Reported on 10/21/2017 08/31/17   Cammie Sickle, MD  potassium chloride SA (K-DUR,KLOR-CON) 20 MEQ tablet Take 1 tablet (20 mEq total) by mouth 2 (two) times daily. Patient not taking: Reported on 10/23/2017 09/12/17   Jacquelin Hawking, NP  predniSONE (DELTASONE) 10 MG tablet Take 1 tablet by mouth daily for 3 days and 0.5 tablet until  gone. 10/21/17   Jacquelin Hawking, NP  prochlorperazine (COMPAZINE) 10 MG tablet Take 1 tablet (10 mg total) by mouth every 6 (six) hours as needed for nausea or vomiting. Patient not taking: Reported on 10/21/2017 08/31/17   Cammie Sickle, MD   No Known Allergies  FAMILY HISTORY:  family history includes Cancer in her father;  Hypertension in her brother and father; Stroke in her father. SOCIAL HISTORY:  reports that she quit smoking about 12 years ago. Her smoking use included cigarettes. She has a 7.50 pack-year smoking history. She has never used smokeless tobacco. She reports that she drinks alcohol. She reports that she does not use drugs.  REVIEW OF SYSTEMS:   Unable to obtain due to critical illness   VITAL SIGNS: Temp:  [97.9 F (36.6 C)-98.6 F (37 C)] 98.3 F (36.8 C) (10/23 0815) Pulse Rate:  [61-132] 124 (10/23 1000) Resp:  [15-32] 26 (10/23 1000) BP: (102-149)/(67-107) 126/73 (10/23 1000) SpO2:  [77 %-100 %] 91 % (10/23 1000) FiO2 (%):  [60 %-78 %] 60 % (10/23 0719)   Physical Examination: A + O x 3 and no focal neuro deficits On NFNC, no distress, BEAE and bibasilar fine rales S1 & S2 audible and no murmur Benign abdomen with normal peristalses No edema  ASSESSMENT / PLAN:  Acute on chronic respiratory failure tolerating HFNC 50-60%. DNR and awaiting home hospice once FIO2 requirement is < [place] 12 L/min. -Gentle diuresis as tolerated to improve lung compliance.  Metastatic adenocarcinoma of the lung s/p chemotherapy. -Started on Fentanyl patch and Oxycodone for analgesia  Infective etiology is less likely( Procal 0.18 and LA < 2) -Consider d/c ABX if cultures remains -ve.  Hyperglycemia -Glycemic control and optimize steroids  Anemia -Keep HB > 7 gm/dl  Depression and anxiety. -Start on Klonopin, PRN  Ativan and continue with supportive care.  DNR. As discussed with palliative care team plan for home hospice  DVT & GI prophylaxis. Continue with supportive care.  Critical care time 28

## 2017-10-26 NOTE — Progress Notes (Signed)
Deanna Blair Lacks   DOB:1955/09/14   XV#:[id number]    Subjective: Patient continues to feel short of breath especially with minimal exertion.  Positive for cough.  Emotional.  Patient has accepted hospice however wants to go home.  Slept better last night after getting antianxiety medication.  Objective:  Vitals:   10/26/17 0815 10/26/17 0950  BP:    Pulse:    Resp:    Temp: 98.3 F (36.8 C)   SpO2:  93%     Intake/Output Summary (Last 24 hours) at 10/26/2017 0957 Last data filed at 10/26/2017 0954 Gross per 24 hour  Intake 590 ml  Output 2200 ml  Net -1610 ml    GENERAL Alert, no distress and comfortable.  On high flow nasal oxygen.  She is alone. EYES: no pallor or icterus OROPHARYNX: no thrush or ulceration. NECK: supple, no masses felt LYMPH:  no palpable lymphadenopathy in the cervical, axillary or inguinal regions LUNGS: Decreased breath sounds bilaterally.  Positive for crackles. HEART/CVS: Tachycardic regular rhythm rhythm and no murmurs; No lower extremity edema ABDOMEN: abdomen soft, tender  on deep palpation. and normal bowel sounds Musculoskeletal:no cyanosis of digits and no clubbing  PSYCH: alert & oriented x 3 with fluent speech; anxious tearful. NEURO: no focal motor/sensory deficits SKIN:  no rashes or significant lesions   Labs:  Lab Results  Component Value Date   WBC 18.3 (H) 10/26/2017   HGB 8.6 (L) 10/26/2017   HCT 27.4 (L) 10/26/2017   MCV 103.4 (H) 10/26/2017   PLT 378 10/26/2017   NEUTROABS 11.4 (H) 10/23/2017    Lab Results  Component Value Date   NA 141 10/26/2017   K 3.9 10/26/2017   CL 106 10/26/2017   CO2 27 10/26/2017    Studies:  Dg Chest Port 1 View  Result Date: 10/25/2017 CLINICAL DATA:  Congestive heart failure. EXAM: PORTABLE CHEST 1 VIEW COMPARISON:  Radiograph of October 24, 2017. FINDINGS: Stable cardiomediastinal silhouette. Stable bilateral lung opacities are noted concerning for inflammation and possible associated  chronic disease. No pneumothorax or large pleural effusion is noted. Right internal jugular Port-A-Cath is unchanged compared to prior exam. IMPRESSION: Stable bilateral lung opacities as described above. Electronically Signed   By: Marijo Conception, M.D.   On: 10/25/2017 07:27    Assessment & Plan:   #62 year old female patient with history of metastatic adenocarcinoma the lung currently admitted to hospital for acute on chronic respiratory failure.  # Metastatic adenocarcinoma the lung status post multiple lines of therapy-clinically most likely progression of disease/based on chest x-rays/clinical evaluation.  Patient continuing high flow nasal oxygen.  Patient is not a candidate for further chemotherapy.  See discussion below  #Acute respiratory failure-likely secondary to progression of disease.  Patient met with hospice; wants to go home on hospice.  However this would be difficult if patient still needing significant oxygen requirements.  I would recommend a CT scan to better evaluate the status of the disease-which might help the patient/family to make decisions regarding disposition.    #If unable to wean off high flow oxygen/might be a candidate for hospice home admission.  Await CT scan; await hospice meeting with husband/family.  Discussed with Josh borders, palliative care nurse practitioner.  Discussed with nurse take care of the patient; and also Dr. Posey Pronto.  Cammie Sickle, MD 10/26/2017  9:57 AM

## 2017-10-26 NOTE — Progress Notes (Signed)
Follow up visit made to new referral for [facility] services at home. Patient seen sitting up in bed, alert, now on nasal cannula at 10 liters. Patient was very emotional having just had a conversation with attending Dr. Posey Pronto. Emotional support given. Writer left contact information with both patient and her husband yesterday. DME has not been ordered and services have not been reviewed. Patient can be supported at home with current liter flow. CMRN Jeanna and Dr. Posey Pronto updated. Will continue to follow. Flo Shanks RN, BSN, [place] [facility] [place], hospital Liaison [phone]

## 2017-10-26 NOTE — Progress Notes (Signed)
Pharmacy Antibiotic Note  Deanna Blair is a 62 y.o. female admitted on 10/23/2017 with pneumonia.  Pharmacy has been consulted for Cefepime dosing.  Patient has stage IV adenocarcinoma with brain mets. Was recently hospitalized in [place] ~10 days ago for PNA (unknown antibiotic course).  Plan: Per AM ICU rounds, will discontinue cefepime and continue to monitor. Patient has not obvious signs of infectious etiology.   Height: 5\' 3"  (160 cm) Weight: 108 lb 0.4 oz (49 kg) IBW/kg (Calculated) : 52.4  Temp (24hrs), Avg:98.2 F (36.8 C), Min:97.9 F (36.6 C), Max:98.6 F (37 C)  Recent Labs  Lab 10/21/17 1443 10/23/17 0538 10/23/17 1000 10/24/17 0901 10/25/17 0503 10/26/17 0727  WBC 15.1* 14.0*  --  14.2*  --  18.3*  CREATININE 0.69 0.63  --  0.55 0.49 0.60  LATICACIDVEN  --  1.5 1.7  --   --   --     Estimated Creatinine Clearance: 56.4 mL/min (by C-G formula based on SCr of 0.6 mg/dL).    No Known Allergies  Antimicrobials this admission: Azithromycin 10/20 >> 10/23 Vancomycin 10/20 >> 10/21 Ceftriaxone 10/20 >> 10/20 Cefepime 10/20 >>10/22  Dose adjustments this admission:  Decreased Cefepime to 2 g IV Q12h from 2 g IV q8h  Microbiology results: 10/20 BCx: no growth x 3 days.  10/20 MRSA PCR: negative   Thank you for allowing pharmacy to be a part of this patient's care.  Bailey Faiella L 10/26/2017 10:43 AM

## 2017-10-27 ENCOUNTER — Ambulatory Visit: Admission: RE | Admit: 2017-10-27 | Payer: BLUE CROSS/BLUE SHIELD | Source: Ambulatory Visit

## 2017-10-27 ENCOUNTER — Telehealth: Payer: Self-pay | Admitting: Internal Medicine

## 2017-10-27 ENCOUNTER — Inpatient Hospital Stay: Payer: BLUE CROSS/BLUE SHIELD

## 2017-10-27 LAB — CBC
HCT: 28.6 % — ABNORMAL LOW (ref 36.0–46.0)
Hemoglobin: 8.9 g/dL — ABNORMAL LOW (ref 12.0–15.0)
MCH: 32.5 pg (ref 26.0–34.0)
MCHC: 31.1 g/dL (ref 30.0–36.0)
MCV: 104.4 fL — AB (ref 80.0–100.0)
PLATELETS: 387 10*3/uL (ref 150–400)
RBC: 2.74 MIL/uL — ABNORMAL LOW (ref 3.87–5.11)
RDW: 19.2 % — ABNORMAL HIGH (ref 11.5–15.5)
WBC: 18.2 10*3/uL — ABNORMAL HIGH (ref 4.0–10.5)
nRBC: 0.2 % (ref 0.0–0.2)

## 2017-10-27 LAB — BASIC METABOLIC PANEL
Anion gap: 9 (ref 5–15)
BUN: 18 mg/dL (ref 8–23)
CALCIUM: 9 mg/dL (ref 8.9–10.3)
CO2: 32 mmol/L (ref 22–32)
CREATININE: 0.53 mg/dL (ref 0.44–1.00)
Chloride: 100 mmol/L (ref 98–111)
Glucose, Bld: 152 mg/dL — ABNORMAL HIGH (ref 70–99)
Potassium: 3.1 mmol/L — ABNORMAL LOW (ref 3.5–5.1)
SODIUM: 141 mmol/L (ref 135–145)

## 2017-10-27 LAB — MAGNESIUM: Magnesium: 1.8 mg/dL (ref 1.7–2.4)

## 2017-10-27 LAB — PHOSPHORUS: PHOSPHORUS: 4 mg/dL (ref 2.5–4.6)

## 2017-10-27 MED ORDER — MAGNESIUM SULFATE 2 GM/50ML IV SOLN
2.0000 g | Freq: Once | INTRAVENOUS | Status: AC
  Administered 2017-10-27: 2 g via INTRAVENOUS
  Filled 2017-10-27: qty 50

## 2017-10-27 MED ORDER — POTASSIUM CHLORIDE CRYS ER 20 MEQ PO TBCR
40.0000 meq | EXTENDED_RELEASE_TABLET | Freq: Once | ORAL | Status: DC
  Administered 2017-10-27: 40 meq via ORAL
  Filled 2017-10-27: qty 2

## 2017-10-27 MED ORDER — POTASSIUM CHLORIDE 20 MEQ/15ML (10%) PO SOLN
40.0000 meq | ORAL | Status: DC
  Filled 2017-10-27 (×2): qty 30

## 2017-10-27 MED ORDER — PHENOL 1.4 % MT LIQD
1.0000 | OROMUCOSAL | Status: DC | PRN
  Administered 2017-10-27: 1 via OROMUCOSAL
  Filled 2017-10-27: qty 177

## 2017-10-27 MED ORDER — POTASSIUM CHLORIDE CRYS ER 20 MEQ PO TBCR: 40.0000 meq | EXTENDED_RELEASE_TABLET | Freq: Two times a day (BID) | ORAL | Status: DC

## 2017-10-27 MED ORDER — POTASSIUM CHLORIDE 20 MEQ PO PACK
40.0000 meq | PACK | Freq: Once | ORAL | Status: DC
  Filled 2017-10-27: qty 2

## 2017-10-27 MED ORDER — POTASSIUM CHLORIDE CRYS ER 20 MEQ PO TBCR
40.0000 meq | EXTENDED_RELEASE_TABLET | Freq: Once | ORAL | Status: AC
  Administered 2017-10-27: 40 meq via ORAL
  Filled 2017-10-27: qty 2

## 2017-10-27 NOTE — Progress Notes (Signed)
[place]  [phone] [phone]   Name: Deanna Blair Date: 10/27/2017 MRN: [id number]  DOB: Jun 23, 1955  Patient Care Team: Mar Daring, PA-C as PCP - General (Family Medicine) Allyne Gee, MD as Referring Physician (Internal Medicine)    REASON FOR CONSULTATION: Palliative Care consult requested for this 62 y.o. 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  PMH also notable for anxiety, depression.  Patient was recently hospitalized in [place] with acute on chronic respiratory failure and discharged about 10 days prior to readmission at [facility] on 10/24/2017 with same.  Initially respiratory distress required BiPAP and then high flow nasal cannula.  Chest x-ray suggests diffuse asymmetric airspace disease greater on the right with infectious differential versus multilobar lung adenocarcinoma.  Palliative care has been asked to help address symptoms and goals.   CODE STATUS: DNR  PAST MEDICAL HISTORY: Past Medical History:  Diagnosis Date  . Anxiety   . Cancer of right lung ([place]) 02/13/2015, 01/13/17  . Depression   . Depression with anxiety    Well controlled with Wellbutrin and Buspar  . Pneumonia     PAST SURGICAL HISTORY:  Past Surgical History:  Procedure Laterality Date  . ECTOPIC PREGNANCY SURGERY  1988  . ELECTROMAGNETIC NAVIGATION BROCHOSCOPY N/A 02/10/2015   Procedure: ELECTROMAGNETIC NAVIGATION BRONCHOSCOPY;  Surgeon: Flora Lipps, MD;  Location: [facility] ORS;  Service: Cardiopulmonary;  Laterality: N/A;  . ENDOBRONCHIAL ULTRASOUND N/A 02/10/2015   Procedure: ENDOBRONCHIAL ULTRASOUND;  Surgeon: Flora Lipps, MD;  Location: [facility] ORS;  Service: Cardiopulmonary;  Laterality: N/A;  . PERIPHERAL VASCULAR CATHETERIZATION N/A 03/05/2015   Procedure: Glori Luis Cath Insertion;  Surgeon: Katha Cabal, MD;  Location: [place] CV LAB;  Service: Cardiovascular;  Laterality: N/A;    HEMATOLOGY/ONCOLOGY HISTORY:  Oncology History   # FEB 2017-ADENO CA Right Lower Lung T4N2M0- STAGE IIIB [Bronch & Subcarinal LNBx]- March 8th START CARBO-ALIMTA x4 cycles- June 8th 2017 - IMPROVED FDG MULTI-FOCAL lesions/N2-LN activity. S/p [place]-- Alimta x6  # AUG 18th- Alimta- Avastin maintenance; SEP 13th DISCONTINUE AVASTIN [sec to cavitary lesion]; OCT 20th 2017- CT-STABLE Disease; NOV 8th CT/PET [facility] clinic]- "overall slight progression-? Lymphangiectatic spread"  # Jan - April 2018Clement Husbands; progression  # April 25th 2018- Carbo- Taxol x3 ccyles; June 2018- CT chest STABLE Disease;July 25th- Add Avastin to [place] taxol x6 cycles- Sep 2018-CT scan- [facility] STABLE   # Sep 2018- Avastin Maintence;   JAN 9th CT- right lung progression [s/p Bx- [place]; [place] [facility]; F-one- 1/15]  # NOV 2nd 2017-MRI, [facility]- Brain mets [asymptomatic;Right frontal ~38m; ~339mlesions -appx 3-4 lesions; (right Frontal & Left parietal s/p GK x 2 lesions] ; June 2018- Progession vs radiation necrosis [July 2th-Add Avastin]  # [facility] 4 small subcentimeter brain lesions for which she had the Gamma knife. The main right frontal lesion gotten smaller; improve edema.  # Jan 2019- Progression [CT chest; [facility]]; Brain MRI- new sub cm lesions- monitored  # feb 14th 2019- Tax-Cyram  # AUG 28th 2019- Gem   --------------------------------------------------------------     MOLECULAR STUDIES:  KRAS MUTATED/Tissues not sufficient for OTHER the molecular marker; will need repeat Bx ; GUARDIANT TESTING [[facility]]- pending. [phone] No targettable mutations.  ------------------------------------------------------  DIAGNOSIS: [ FEB 2017]- ADENO CA LUNG  STAGE:   IV   ;GOALS: palliative  CURRENT/MOST RECENT THERAPY- Gem      Primary cancer of right lower lobe of lung (HCC)    ALLERGIES:  has No Known Allergies.  MEDICATIONS:  Current Facility-Administered Medications  Medication Dose Route Frequency Provider Last Rate Last Dose  . acetaminophen (TYLENOL) tablet 650 mg  650 mg Oral Q6H PRN Dustin Flock, MD       Or  . acetaminophen (TYLENOL) suppository 650 mg  650 mg Rectal Q6H PRN Dustin Flock, MD      . budesonide (PULMICORT) nebulizer solution 0.25 mg  0.25 mg Nebulization BID Dustin Flock, MD   0.25 mg at 10/26/17 1922  . clonazePAM (KLONOPIN) tablet 0.5 mg  0.5 mg Oral BID Dustin Flock, MD   0.5 mg at 10/27/17 0932  . enoxaparin (LOVENOX) injection 40 mg  40 mg Subcutaneous Q24H Cyndee Brightly Allen, [place]   40 mg at 10/26/17 2101  . famotidine (PEPCID) tablet 20 mg  20 mg Oral BID Cassandria Santee, MD   20 mg at 10/27/17 0932  . feeding supplement (BOOST / RESOURCE BREEZE) liquid 1 Container  1 Container Oral TID BM Cassandria Santee, MD   1 Container at 10/27/17 1340  . fentaNYL (DURAGESIC - dosed mcg/hr) patch 25 mcg  25 mcg Transdermal Q72H Cassandria Santee, MD   25 mcg at 10/27/17 0934  . furosemide (LASIX) tablet 40 mg  40 mg Oral BID Cassandria Santee, MD   40 mg at 10/27/17 0855  . Influenza vac split quadrivalent PF (FLUARIX) injection 0.5 mL  0.5 mL Intramuscular Tomorrow-1000 Tyler Pita, MD      . ipratropium-albuterol (DUONEB) 0.5-2.5 (3) MG/3ML nebulizer solution 3 mL  3 mL Nebulization Q4H Dustin Flock, MD   3 mL at 10/27/17 1557  . LORazepam (ATIVAN) tablet 1 mg  1 mg Oral Q6H PRN Tyler Pita, MD   1 mg at 10/27/17 0209  . methylPREDNISolone sodium succinate (SOLU-MEDROL) 40 mg/mL injection 40 mg  40 mg Intravenous Q8H Samaan, Maged, MD   40 mg at 10/27/17 1340  . ondansetron (ZOFRAN) tablet 4 mg  4 mg Oral Q6H PRN Dustin Flock, MD       Or  . ondansetron (ZOFRAN) injection 4 mg  4 mg Intravenous Q6H PRN Dustin Flock, MD      . oxyCODONE (Oxy IR/ROXICODONE) immediate release tablet 10 mg  10 mg Oral Q4H PRN Cassandria Santee, MD   10 mg at 10/27/17 1542  . phenol (CHLORASEPTIC) mouth spray 1 spray  1 spray Mouth/Throat PRN Bradly Bienenstock, NP   1 spray at 10/27/17 [phone]  . polyethylene glycol (MIRALAX / GLYCOLAX) packet 17 g  17 g Oral Daily Samaan, Maged, MD   17 g at 10/26/17 1058  . senna-docusate (Senokot-S) tablet 2 tablet  2 tablet Oral BID Dustin Flock, MD   2 tablet at 10/26/17 2101   Facility-Administered Medications Ordered in Other Encounters  Medication Dose Route Frequency Provider Last Rate Last Dose  . 0.9 %  sodium chloride infusion   Intravenous Continuous Faythe Casa E, NP      . 0.9 %  sodium chloride infusion   Intravenous Continuous Faythe Casa E, NP      . dexamethasone (DECADRON) injection 10 mg  10 mg Intravenous Once Faythe Casa E, NP      . heparin lock flush 100 unit/mL  500 Units Intravenous Once [place]  R, MD        VITAL SIGNS: BP (!) 109/53   Pulse 93   Temp 98.3 F (36.8 C) (Oral)   Resp 18   Ht 5' 3"  (1.6 m)   Wt 108 lb 0.4 oz (49 kg)   LMP 06/16/2000 (Approximate) Comment: 15 yrs ago  SpO2 94%   BMI 19.14 kg/m  Filed Weights   10/23/17 0535 10/23/17 0942  Weight: 101 lb 6.2 oz (46 kg) 108 lb 0.4 oz (49 kg)    Estimated body mass index is 19.14 kg/m as calculated from the following:   Height as of this encounter: 5' 3"  (1.6 m).   Weight as of this encounter: 108 lb 0.4 oz (49 kg).  LABS: CBC:    Component Value Date/Time   WBC 18.2 (H) 10/27/2017 0438   HGB 8.9 (L) 10/27/2017 0438   HGB 11.4 01/03/2015 0910   HCT 28.6 (L) 10/27/2017 0438   HCT 34.1 01/03/2015 0910   PLT 387 10/27/2017 0438   PLT 803 (HH) 12/20/2014 0948   MCV 104.4 (H) 10/27/2017 0438   MCV 94 01/03/2015 0910   NEUTROABS 11.4 (H) 10/23/2017 0538   NEUTROABS 4.3 01/03/2015 0910   LYMPHSABS 1.3 10/23/2017 0538   LYMPHSABS 1.0 01/03/2015 0910    MONOABS 0.8 10/23/2017 0538   EOSABS 0.5 10/23/2017 0538   EOSABS 0.2 01/03/2015 0910   BASOSABS 0.0 10/23/2017 0538   BASOSABS 0.0 01/03/2015 0910   Comprehensive Metabolic Panel:    Component Value Date/Time   NA 141 10/27/2017 0438   NA 139 01/03/2015 0910   K 3.1 (L) 10/27/2017 0438   CL 100 10/27/2017 0438   CO2 32 10/27/2017 0438   BUN 18 10/27/2017 0438   BUN 10 01/03/2015 0910   CREATININE 0.53 10/27/2017 0438   GLUCOSE 152 (H) 10/27/2017 0438   CALCIUM 9.0 10/27/2017 0438   AST 34 10/21/2017 1443   ALT 20 10/21/2017 1443   ALKPHOS 59 10/21/2017 1443   BILITOT 0.5 10/21/2017 1443   BILITOT <0.2 01/03/2015 0910   PROT 6.3 (L) 10/21/2017 1443   PROT 6.5 01/03/2015 0910   ALBUMIN 2.9 (L) 10/21/2017 1443   ALBUMIN 3.8 01/03/2015 0910    RADIOGRAPHIC STUDIES: Ct Chest Wo Contrast  Result Date: 10/26/2017 CLINICAL DATA:  Inpatient. Acute on chronic respiratory failure. Metastatic right lung adenocarcinoma. EXAM: CT CHEST WITHOUT CONTRAST TECHNIQUE: Multidetector CT imaging of the chest was performed following the standard protocol without IV contrast. COMPARISON:  09/20/2017 chest CT. Chest radiograph from one day prior. FINDINGS: Cardiovascular: Normal heart size. No significant pericardial effusion/thickening. Left anterior descending coronary atherosclerosis. Right internal jugular MediPort terminates at the cavoatrial junction. Atherosclerotic nonaneurysmal thoracic aorta. Normal caliber pulmonary arteries. Mediastinum/Nodes: No discrete thyroid nodules. Unremarkable esophagus. No axillary adenopathy. No pathologically enlarged mediastinal nodes. No discrete hilar adenopathy on this noncontrast scan. Lungs/Pleura: No pneumothorax. Trace dependent bilateral pleural effusions, new bilaterally. There is extensive patchy consolidation and ground-glass opacity throughout the right greater than left lungs with extensive cavitary change throughout the right lower lobe. These findings  involve all of the lung lobes and have substantially worsened throughout both lungs, most notably in the left lung with new involvement of the left lower lobe and significantly increased involvement of the left upper lobe. Upper abdomen: No acute abnormality. Musculoskeletal:  No aggressive appearing focal osseous lesions. IMPRESSION: 1. Extensive patchy consolidation and ground-glass opacity throughout the right greater than left lungs involving all lung lobes with extensive cavitary change in  the right lower lobe. Findings have substantially worsened since 09/20/2017 chest CT, most notably in the left lung. Findings are most suggestive of continued progression of multilobar lung adenocarcinoma, although a superimposed process such as diffuse alveolar hemorrhage or an inflammatory process such as acute interstitial pneumonia/ARDS, atypical pneumonia, drug reaction or hypersensitivity pneumonitis cannot be excluded. 2. New trace dependent bilateral pleural effusions. 3. One vessel coronary atherosclerosis. Aortic Atherosclerosis (ICD10-I70.0). Electronically Signed   By: Ilona Sorrel M.D.   On: 10/26/2017 13:31   Dg Chest Port 1 View  Result Date: 10/27/2017 CLINICAL DATA:  Diffuse ground-glass opacities, follow-up EXAM: PORTABLE CHEST 1 VIEW COMPARISON:  CT chest of 10/26/2017 and chest x-ray of 10/25/2017 FINDINGS: There is little change to very slight worsening of the bilateral alveolar opacities right greater than left. There may be a small right effusion present. Right-sided Port-A-Cath tip overlies the expected SVC-RA junction and mild cardiomegaly is stable. No bony abnormality is seen. IMPRESSION: 1. Little change to very minimal worsening of diffuse airspace disease right greater than left. 2. Right-sided Port-A-Cath tip overlies expected SVC-RA junction. Electronically Signed   By: Ivar Drape M.D.   On: 10/27/2017 09:59   Dg Chest Port 1 View  Result Date: 10/25/2017 CLINICAL DATA:  Congestive  heart failure. EXAM: PORTABLE CHEST 1 VIEW COMPARISON:  Radiograph of October 24, 2017. FINDINGS: Stable cardiomediastinal silhouette. Stable bilateral lung opacities are noted concerning for inflammation and possible associated chronic disease. No pneumothorax or large pleural effusion is noted. Right internal jugular Port-A-Cath is unchanged compared to prior exam. IMPRESSION: Stable bilateral lung opacities as described above. Electronically Signed   By: Marijo Conception, M.D.   On: 10/25/2017 07:27   Dg Chest Port 1 View  Result Date: 10/24/2017 CLINICAL DATA:  62 year old female with acute respiratory failure. Subsequent encounter. EXAM: PORTABLE CHEST 1 VIEW COMPARISON:  10/23/2017 chest x-ray.  09/20/2017 chest CT. FINDINGS: Diffuse asymmetric airspace disease greater on the right similar to prior chest x-ray. Question infectious infiltrate superimposed upon chronic changes as versus result of multilobar lung adenocarcinoma as suggested on 09/20/2017 chest CT. MediPort catheter tip caval atrial junction level. No pneumothorax noted. Cardiomegaly. IMPRESSION: 1. No significant change in appearance of diffuse asymmetric airspace disease greater on the right. Question infectious infiltrate superimposed upon chronic changes as versus result of multilobar lung adenocarcinoma as suggested on 09/20/2017 chest CT. 2. Cardiomegaly. 3.  Aortic Atherosclerosis (ICD10-I70.0). Electronically Signed   By: Genia Del M.D.   On: 10/24/2017 07:31   Dg Chest Port 1 View  Result Date: 10/23/2017 CLINICAL DATA:  Shortness of breath EXAM: PORTABLE CHEST 1 VIEW COMPARISON:  Chest CT 09/20/2017 FINDINGS: Extensive reticular opacities throughout the right lung are compatible with the findings of the chest CT 09/20/2017, but have progressed since that time. The left lung remains relatively clear. Right chest wall Port-A-Cath tip is at the cavoatrial junction.Small right pleural effusion. IMPRESSION: Chronic right lung  reticular opacities, which have progressed compared to the radiograph 09/15/2017. Small right pleural effusion. Electronically Signed   By: Ulyses Jarred M.D.   On: 10/23/2017 06:14    PERFORMANCE STATUS (ECOG) : 4 - Bedbound  Review of Systems As noted above. Otherwise, a complete review of systems is negative.  Physical Exam General: frail appearing, thin, ill appearing, lying in bed Pulmonary: unlabored, on o2 Skin: no rashes Neurological: Wakes with stimulation, sleeping  IMPRESSION: Patient transferred out of the ICU today.  Note results of CT revealed extensive patchy consolidation and  groundglass opacity throughout the lungs likely suggestive of continued progression of multilobar lung adenocarcinoma.  Patient has been weaned from high flow O2.  She currently appears to be tolerating O2 via nasal cannula at 7 L.  Agree with plan for hospice.  Spoke with patient's family in the room.  PLAN: Continue supportive care Plan for discharge home with hospice when medically ready  Time Total: 15 minutes  Visit consisted of counseling and education dealing with the complex and emotionally intense issues of symptom management and palliative care in the setting of serious and potentially life-threatening illness.Greater than 50%  of this time was spent counseling and coordinating care related to the above assessment and plan.  Signed by: Altha Harm, [place], NP-C, [place] (Work Cell)

## 2017-10-27 NOTE — Consult Note (Signed)
Pharmacy Electrolyte Monitoring Consult:  Pharmacy consulted to assist in monitoring and replacing electrolytes in this 62 y.o. female admitted on 10/23/2017 with Shortness of Breath   Labs:  Sodium (mmol/L)  Date Value  10/27/2017 141  01/03/2015 139   Potassium (mmol/L)  Date Value  10/27/2017 3.1 (L)   Magnesium (mg/dL)  Date Value  10/27/2017 1.8   Phosphorus (mg/dL)  Date Value  10/27/2017 4.0   Calcium (mg/dL)  Date Value  10/27/2017 9.0   Albumin (g/dL)  Date Value  10/21/2017 2.9 (L)  01/03/2015 3.8    Assessment/Plan: Electrolytes Goals: Magnesium ~2.0, Potassium ~4.0  Ordered KCl 40 mEq PO x 2 and Mg sulfate 2 g IV x 1.  Patient plan is to transition to hospice with home, will follow up as clinically indicated. Will not order labs for 10/25.  Constipation Last BM was 10/23. Started patient's PTA medication of Fentanyl 25 mcg patch on 10/21, with Oxycodone 10 mg IR q4h prn for breakthrough pain. Patient is comfortable pain-wise per CCM rounds' discussion.  Patient receiving senna-dok 2 tablets BID and Miralax daily.  Pharmacy will continue to monitor and adjust as necessary.  Paticia Stack, PharmD Pharmacy Resident  10/27/2017 11:54 AM

## 2017-10-27 NOTE — Progress Notes (Signed)
Name: Deanna Blair MRN: [id number] DOB: 1955/10/27     CONSULTATION DATE: 10/23/2017  Subjective & objectives: FiO2 requirement down to 10 L high flow nasal cannula.  PAST MEDICAL HISTORY :   has a past medical history of Anxiety, Cancer of right lung ([place]) (02/13/2015, 01/13/17), Depression, Depression with anxiety, and Pneumonia.  has a past surgical history that includes Ectopic pregnancy surgery (1988); Endobronchial ultrasound (N/A, 02/10/2015); Electormagnetic navigation bronchoscopy (N/A, 02/10/2015); and Cardiac catheterization (N/A, 03/05/2015). Prior to Admission medications   Medication Sig Start Date End Date Taking? Authorizing Provider  acetaminophen (TYLENOL) 500 MG tablet Take 1,000 mg by mouth every 4 (four) hours as needed for mild pain or moderate pain.    Yes [provider]  albuterol (PROVENTIL) (2.5 MG/3ML) 0.083% nebulizer solution Take 2.5 mg by nebulization every 6 (six) hours as needed for wheezing.  10/12/17  Yes [provider]  BEVESPI AEROSPHERE 9-4.8 MCG/ACT AERO Inhale 2 puffs into the lungs 2 (two) times daily. 10/12/17  Yes [provider]  docusate sodium (COLACE) 100 MG capsule Take 100 mg by mouth 2 (two) times daily.   Yes [provider]  lidocaine-prilocaine (EMLA) cream Apply 1 application topically as needed. 12/22/16  Yes Cammie Sickle, MD  LORazepam (ATIVAN) 0.5 MG tablet Take 1 tablet (0.5 mg total) by mouth 2 (two) times daily. As needed for anxiety/ nausea 08/24/17  Yes Cammie Sickle, MD  Oxycodone HCl 10 MG TABS Take 1 tablet (10 mg total) by mouth every 6 (six) hours as needed (moderate to severe pain). 09/21/17  Yes Cammie Sickle, MD  polyethylene glycol (MIRALAX / GLYCOLAX) packet Take 17 g by mouth daily as needed for mild constipation or moderate constipation.    Yes [provider]  VENTOLIN HFA 108 (90 Base) MCG/ACT inhaler Inhale 1 puff into the lungs every 6 (six) hours  as needed for wheezing or shortness of breath.  10/12/17  Yes [provider]  chlorpheniramine-HYDROcodone (TUSSIONEX) 10-8 MG/5ML SUER Take 5 mLs by mouth 2 (two) times daily. Patient not taking: Reported on 10/21/2017 09/21/17   Cammie Sickle, MD  dexamethasone (DECADRON) 4 MG tablet Take 1 tablet (4 mg total) by mouth 2 (two) times daily with a meal. Start the day prior to chemo; for 3 days. Patient not taking: Reported on 10/21/2017 02/14/17   Cammie Sickle, MD  fentaNYL (DURAGESIC - DOSED MCG/HR) 25 MCG/HR patch Place 1 patch (25 mcg total) onto the skin every 3 (three) days. Patient not taking: Reported on 10/21/2017 09/09/17   Cammie Sickle, MD  Fluticasone-Umeclidin-Vilant (TRELEGY ELLIPTA) 100-62.5-25 MCG/INH AEPB Inhale 1 puff into the lungs daily. Patient not taking: Reported on 10/21/2017 07/28/17   Allyne Gee, MD  ibuprofen (IBU) 400 MG tablet Take 1 tablet (400 mg total) by mouth 3 (three) times daily as needed. Patient not taking: Reported on 10/21/2017 07/11/17   Cammie Sickle, MD  ondansetron (ZOFRAN) 8 MG tablet Take 1 tablet (8 mg total) by mouth every 8 (eight) hours as needed for nausea or vomiting (start 3 days; after chemo). Patient not taking: Reported on 10/21/2017 08/31/17   Cammie Sickle, MD  potassium chloride SA (K-DUR,KLOR-CON) 20 MEQ tablet Take 1 tablet (20 mEq total) by mouth 2 (two) times daily. Patient not taking: Reported on 10/23/2017 09/12/17   Jacquelin Hawking, NP  predniSONE (DELTASONE) 10 MG tablet Take 1 tablet by mouth daily for 3 days and 0.5 tablet until  gone. 10/21/17   Jacquelin Hawking, NP  prochlorperazine (COMPAZINE) 10 MG tablet Take 1 tablet (10 mg total) by mouth every 6 (six) hours as needed for nausea or vomiting. Patient not taking: Reported on 10/21/2017 08/31/17   Cammie Sickle, MD   No Known Allergies  FAMILY HISTORY:  family history includes Cancer in her father; Hypertension in her  brother and father; Stroke in her father. SOCIAL HISTORY:  reports that she quit smoking about 12 years ago. Her smoking use included cigarettes. She has a 7.50 pack-year smoking history. She has never used smokeless tobacco. She reports that she drinks alcohol. She reports that she does not use drugs.  REVIEW OF SYSTEMS:   Unable to obtain due to critical illness   VITAL SIGNS: Temp:  [97.7 F (36.5 C)-99.1 F (37.3 C)] 97.7 F (36.5 C) (10/24 0700) Pulse Rate:  [78-133] 124 (10/24 1300) Resp:  [15-28] 23 (10/24 1300) BP: (109-142)/(67-85) 109/68 (10/24 1300) SpO2:  [89 %-100 %] 100 % (10/24 1300)  Physical Examination: A + O x 3 and no focal neuro deficits On NFNC 10 L/min, no distress, BEAE and no rales. S1 & S2 audible and no murmur Benign abdomen with normal peristalses No edema  ASSESSMENT / PLAN:  Acute on chronic respiratory failure tolerating HFNC 10 L/min. DNR and awaiting home hospice. -Gentle diuresis as tolerated to improve lung compliance.  Metastatic adenocarcinoma of the lung s/p chemotherapy.  Worsening CT chest findings -Started on Fentanyl patch and Oxycodone for analgesia -Hospice  Infective etiology is less likely( Procal 0.18 and LA < 2) -Off ABX, cultures remains -ve.  Hyperglycemia -Glycemic controland optimize steroids  Anemia -Keep HB > 7 gm/dl  Hypokalemia -Replete and monitor electrolytes  Depression and anxiety. -On Klonopin, PRN  Ativan and continue with supportive care.  DNR. As discussed with palliative care team plan for home hospice  DVT & GI prophylaxis. Continue with supportive care.  Critical care time 52

## 2017-10-27 NOTE — Progress Notes (Signed)
Deanna Blair   DOB:28-Mar-1955   UV#:[id number]    Subjective: Patient is currently on nasal cannula.  Oxygen requirements improved.  Continues to be tachycardic.  Appetite poor.  Tearful. Objective:  Vitals:   10/27/17 1601 10/27/17 1607  BP:    Pulse: 93   Resp: 18   Temp:    SpO2: 96% 94%     Intake/Output Summary (Last 24 hours) at 10/27/2017 1705 Last data filed at 10/27/2017 1400 Gross per 24 hour  Intake 280.75 ml  Output 2000 ml  Net -1719.25 ml    GENERAL Alert, no distress and comfortable.  On nasal cannula.  She is alone. EYES: no pallor or icterus OROPHARYNX: no thrush or ulceration. NECK: supple, no masses felt LYMPH:  no palpable lymphadenopathy in the cervical, axillary or inguinal regions LUNGS: Decreased breath sounds bilaterally.  Positive for crackles. HEART/CVS: Tachycardic regular rhythm rhythm and no murmurs; No lower extremity edema ABDOMEN: abdomen soft, tender  on deep palpation. and normal bowel sounds Musculoskeletal:no cyanosis of digits and no clubbing  PSYCH: alert & oriented x 3 with fluent speech; anxious tearful. NEURO: no focal motor/sensory deficits SKIN:  no rashes or significant lesions   Labs:  Lab Results  Component Value Date   WBC 18.2 (H) 10/27/2017   HGB 8.9 (L) 10/27/2017   HCT 28.6 (L) 10/27/2017   MCV 104.4 (H) 10/27/2017   PLT 387 10/27/2017   NEUTROABS 11.4 (H) 10/23/2017    Lab Results  Component Value Date   NA 141 10/27/2017   K 3.1 (L) 10/27/2017   CL 100 10/27/2017   CO2 32 10/27/2017    Studies:  Ct Chest Wo Contrast  Result Date: 10/26/2017 CLINICAL DATA:  Inpatient. Acute on chronic respiratory failure. Metastatic right lung adenocarcinoma. EXAM: CT CHEST WITHOUT CONTRAST TECHNIQUE: Multidetector CT imaging of the chest was performed following the standard protocol without IV contrast. COMPARISON:  09/20/2017 chest CT. Chest radiograph from one day prior. FINDINGS: Cardiovascular: Normal heart size.  No significant pericardial effusion/thickening. Left anterior descending coronary atherosclerosis. Right internal jugular MediPort terminates at the cavoatrial junction. Atherosclerotic nonaneurysmal thoracic aorta. Normal caliber pulmonary arteries. Mediastinum/Nodes: No discrete thyroid nodules. Unremarkable esophagus. No axillary adenopathy. No pathologically enlarged mediastinal nodes. No discrete hilar adenopathy on this noncontrast scan. Lungs/Pleura: No pneumothorax. Trace dependent bilateral pleural effusions, new bilaterally. There is extensive patchy consolidation and ground-glass opacity throughout the right greater than left lungs with extensive cavitary change throughout the right lower lobe. These findings involve all of the lung lobes and have substantially worsened throughout both lungs, most notably in the left lung with new involvement of the left lower lobe and significantly increased involvement of the left upper lobe. Upper abdomen: No acute abnormality. Musculoskeletal:  No aggressive appearing focal osseous lesions. IMPRESSION: 1. Extensive patchy consolidation and ground-glass opacity throughout the right greater than left lungs involving all lung lobes with extensive cavitary change in the right lower lobe. Findings have substantially worsened since 09/20/2017 chest CT, most notably in the left lung. Findings are most suggestive of continued progression of multilobar lung adenocarcinoma, although a superimposed process such as diffuse alveolar hemorrhage or an inflammatory process such as acute interstitial pneumonia/ARDS, atypical pneumonia, drug reaction or hypersensitivity pneumonitis cannot be excluded. 2. New trace dependent bilateral pleural effusions. 3. One vessel coronary atherosclerosis. Aortic Atherosclerosis (ICD10-I70.0). Electronically Signed   By: Ilona Sorrel M.D.   On: 10/26/2017 13:31   Dg Chest Port 1 View  Result Date: 10/27/2017 CLINICAL DATA:  Diffuse ground-glass  opacities, follow-up EXAM: PORTABLE CHEST 1 VIEW COMPARISON:  CT chest of 10/26/2017 and chest x-ray of 10/25/2017 FINDINGS: There is little change to very slight worsening of the bilateral alveolar opacities right greater than left. There may be a small right effusion present. Right-sided Port-A-Cath tip overlies the expected SVC-RA junction and mild cardiomegaly is stable. No bony abnormality is seen. IMPRESSION: 1. Little change to very minimal worsening of diffuse airspace disease right greater than left. 2. Right-sided Port-A-Cath tip overlies expected SVC-RA junction. Electronically Signed   By: Ivar Drape M.D.   On: 10/27/2017 09:59    Assessment & Plan:   #62 year old female patient with history of metastatic adenocarcinoma the lung currently admitted to hospital for acute on chronic respiratory failure.  # Metastatic adenocarcinoma the lung status post multiple lines of therapy-October 23 CT scan shows progressive disease. Patient not a candidate for any further chemotherapy given the significant decline in his respiratory status  #Acute respiratory failure-likely secondary to progression of disease/some improvement in oxygen requirement; patient to be discharged home on nasal cannula with home hospice.  #Discussed with Dr. Posey Pronto.  Discussed with Karen/Josh borders from prior to care.  Also spoke to the patient's husband regarding the worsening CT scan-in the life expectancy in the order of weeks.   Cammie Sickle, MD 10/27/2017  5:05 PM

## 2017-10-27 NOTE — Telephone Encounter (Signed)
Spoke to husband- re: results of the CT scan- worse. Recommend hospice care; awaiting hospice meeting this AM.

## 2017-10-27 NOTE — Progress Notes (Signed)
[place] at [facility]                                                                                                                                                                                  Patient Demographics   Deanna Blair, is a 62 y.o. female, DOB - 21-Sep-1955, NTI:[id number]  Admit date - 10/23/2017   Admitting Physician Dustin Flock, MD  Outpatient Primary MD for the patient is Mar Daring, PA-C   LOS - 4  Subjective: Patient continues to require high flow oxygen with significant improvement in condition   Review of Systems:   CONSTITUTIONAL: No documented fever. No fatigue, weakness. No weight gain, no weight loss.  EYES: No blurry or double vision.  ENT: No tinnitus. No postnasal drip. No redness of the oropharynx.  RESPIRATORY: No cough, no wheeze, no hemoptysis.  Positive dyspnea.  CARDIOVASCULAR: No chest pain. No orthopnea. No palpitations. No syncope.  GASTROINTESTINAL: No nausea, no vomiting or diarrhea. No abdominal pain. No melena or hematochezia.  GENITOURINARY: No dysuria or hematuria.  ENDOCRINE: No polyuria or nocturia. No heat or cold intolerance.  HEMATOLOGY: No anemia. No bruising. No bleeding.  INTEGUMENTARY: No rashes. No lesions.  MUSCULOSKELETAL: No arthritis. No swelling. No gout.  NEUROLOGIC: No numbness, tingling, or ataxia. No seizure-type activity.  PSYCHIATRIC: No anxiety. No insomnia. No ADD.    Vitals:   Vitals:   10/27/17 1000 10/27/17 1100 10/27/17 1200 10/27/17 1300  BP: 121/80 128/82 116/77 109/68  Pulse: 94 94 (!) 131 (!) 124  Resp: 20 19 (!) 24 (!) 23  Temp:      TempSrc:      SpO2: 99% 96% 98% 100%  Weight:      Height:        Wt Readings from Last 3 Encounters:  10/23/17 49 kg  10/17/17 46 kg  09/21/17 49 kg     Intake/Output Summary (Last 24 hours) at 10/27/2017 1405 Last data filed at 10/27/2017 1356 Gross per 24 hour  Intake 280.75 ml  Output 1850 ml  Net -1569.25  ml    Physical Exam:   GENERAL: Mild respiratory distress HEAD, EYES, EARS, NOSE AND THROAT: Atraumatic, normocephalic. Extraocular muscles are intact. Pupils equal and reactive to light. Sclerae anicteric. No conjunctival injection. No oro-pharyngeal erythema.  NECK: Supple. There is no jugular venous distention. No bruits, no lymphadenopathy, no thyromegaly.  HEART: Regular rate and rhythm,. No murmurs, no rubs, no clicks.  LUNGS: Diminished breath sounds bilaterally.  ABDOMEN: Soft, flat, nontender, nondistended. Has good bowel sounds. No hepatosplenomegaly appreciated.  EXTREMITIES: No evidence of any cyanosis, clubbing, or peripheral edema.  +2 pedal and radial  pulses bilaterally.  NEUROLOGIC: The patient is alert, awake, and oriented x3 with no focal motor or sensory deficits appreciated bilaterally.  SKIN: Moist and warm with no rashes appreciated.  Psych: Not anxious, depressed LN: No inguinal LN enlargement    Antibiotics   Anti-infectives (From admission, onward)   Start     Dose/Rate Route Frequency Ordered Stop   10/25/17 1800  ceFEPIme (MAXIPIME) 2 g in sodium chloride 0.9 % 100 mL IVPB  Status:  Discontinued     2 g 200 mL/hr over 30 Minutes Intravenous Every 12 hours 10/25/17 1053 10/26/17 1026   10/25/17 1000  azithromycin (ZITHROMAX) 500 mg in sodium chloride 0.9 % 250 mL IVPB     500 mg 250 mL/hr over 60 Minutes Intravenous Every 24 hours 10/24/17 0912 10/25/17 1040   10/23/17 1800  vancomycin (VANCOCIN) IVPB 750 mg/150 ml premix  Status:  Discontinued     750 mg 150 mL/hr over 60 Minutes Intravenous Every 12 hours 10/23/17 0959 10/24/17 1011   10/23/17 1400  ceFEPIme (MAXIPIME) 2 g in sodium chloride 0.9 % 100 mL IVPB  Status:  Discontinued     2 g 200 mL/hr over 30 Minutes Intravenous Every 8 hours 10/23/17 0959 10/25/17 1053   10/23/17 0645  vancomycin (VANCOCIN) IVPB 1000 mg/200 mL premix     1,000 mg 200 mL/hr over 60 Minutes Intravenous  Once 10/23/17 0632  10/23/17 0848   10/23/17 0545  cefTRIAXone (ROCEPHIN) 2 g in sodium chloride 0.9 % 100 mL IVPB  Status:  Discontinued     2 g 200 mL/hr over 30 Minutes Intravenous Every 24 hours 10/23/17 0539 10/23/17 1002   10/23/17 0545  azithromycin (ZITHROMAX) 500 mg in sodium chloride 0.9 % 250 mL IVPB  Status:  Discontinued     500 mg 250 mL/hr over 60 Minutes Intravenous Every 24 hours 10/23/17 0539 10/24/17 0912      Medications   Scheduled Meds: . budesonide (PULMICORT) nebulizer solution  0.25 mg Nebulization BID  . clonazePAM  0.5 mg Oral BID  . enoxaparin (LOVENOX) injection  40 mg Subcutaneous Q24H  . famotidine  20 mg Oral BID  . feeding supplement  1 Container Oral TID BM  . fentaNYL  25 mcg Transdermal Q72H  . furosemide  40 mg Oral BID  . Influenza vac split quadrivalent PF  0.5 mL Intramuscular Tomorrow-1000  . ipratropium-albuterol  3 mL Nebulization Q4H  . methylPREDNISolone (SOLU-MEDROL) injection  40 mg Intravenous Q8H  . polyethylene glycol  17 g Oral Daily  . senna-docusate  2 tablet Oral BID   Continuous Infusions:  PRN Meds:.acetaminophen **OR** acetaminophen, LORazepam, ondansetron **OR** ondansetron (ZOFRAN) IV, oxyCODONE, phenol   Data Review:   Micro Results Recent Results (from the past 240 hour(s))  Blood Culture (routine x 2)     Status: None (Preliminary result)   Collection Time: 10/23/17  5:45 AM  Result Value Ref Range Status   Specimen Description BLOOD BLOOD LEFT FOREARM  Final   Special Requests   Final    BOTTLES DRAWN AEROBIC AND ANAEROBIC Blood Culture results may not be optimal due to an inadequate volume of blood received in culture bottles   Culture   Final    NO GROWTH 4 DAYS Performed at [facility], [address], [place], [place] [address]    Report Status PENDING  Incomplete  Blood Culture (routine x 2)     Status: None (Preliminary result)   Collection Time: 10/23/17  5:45  AM  Result Value Ref Range Status   Specimen  Description BLOOD BLOOD RIGHT FOREARM  Final   Special Requests   Final    BOTTLES DRAWN AEROBIC AND ANAEROBIC Blood Culture adequate volume   Culture   Final    NO GROWTH 4 DAYS Performed at [facility], [address], [place], [place] [address]    Report Status PENDING  Incomplete  MRSA PCR Screening     Status: None   Collection Time: 10/23/17  9:47 AM  Result Value Ref Range Status   MRSA by PCR NEGATIVE NEGATIVE Final    Comment:        The GeneXpert MRSA Assay (FDA approved for NASAL specimens only), is one component of a comprehensive MRSA colonization surveillance program. It is not intended to diagnose MRSA infection nor to guide or monitor treatment for MRSA infections. Performed at [facility], [address], [place], [place] [address]     Radiology Reports Ct Chest Wo Contrast  Result Date: 10/26/2017 CLINICAL DATA:  Inpatient. Acute on chronic respiratory failure. Metastatic right lung adenocarcinoma. EXAM: CT CHEST WITHOUT CONTRAST TECHNIQUE: Multidetector CT imaging of the chest was performed following the standard protocol without IV contrast. COMPARISON:  09/20/2017 chest CT. Chest radiograph from one day prior. FINDINGS: Cardiovascular: Normal heart size. No significant pericardial effusion/thickening. Left anterior descending coronary atherosclerosis. Right internal jugular MediPort terminates at the cavoatrial junction. Atherosclerotic nonaneurysmal thoracic aorta. Normal caliber pulmonary arteries. Mediastinum/Nodes: No discrete thyroid nodules. Unremarkable esophagus. No axillary adenopathy. No pathologically enlarged mediastinal nodes. No discrete hilar adenopathy on this noncontrast scan. Lungs/Pleura: No pneumothorax. Trace dependent bilateral pleural effusions, new bilaterally. There is extensive patchy consolidation and ground-glass opacity throughout the right greater than left lungs with extensive cavitary change throughout the right  lower lobe. These findings involve all of the lung lobes and have substantially worsened throughout both lungs, most notably in the left lung with new involvement of the left lower lobe and significantly increased involvement of the left upper lobe. Upper abdomen: No acute abnormality. Musculoskeletal:  No aggressive appearing focal osseous lesions. IMPRESSION: 1. Extensive patchy consolidation and ground-glass opacity throughout the right greater than left lungs involving all lung lobes with extensive cavitary change in the right lower lobe. Findings have substantially worsened since 09/20/2017 chest CT, most notably in the left lung. Findings are most suggestive of continued progression of multilobar lung adenocarcinoma, although a superimposed process such as diffuse alveolar hemorrhage or an inflammatory process such as acute interstitial pneumonia/ARDS, atypical pneumonia, drug reaction or hypersensitivity pneumonitis cannot be excluded. 2. New trace dependent bilateral pleural effusions. 3. One vessel coronary atherosclerosis. Aortic Atherosclerosis (ICD10-I70.0). Electronically Signed   By: Ilona Sorrel M.D.   On: 10/26/2017 13:31   Dg Chest Port 1 View  Result Date: 10/27/2017 CLINICAL DATA:  Diffuse ground-glass opacities, follow-up EXAM: PORTABLE CHEST 1 VIEW COMPARISON:  CT chest of 10/26/2017 and chest x-ray of 10/25/2017 FINDINGS: There is little change to very slight worsening of the bilateral alveolar opacities right greater than left. There may be a small right effusion present. Right-sided Port-A-Cath tip overlies the expected SVC-RA junction and mild cardiomegaly is stable. No bony abnormality is seen. IMPRESSION: 1. Little change to very minimal worsening of diffuse airspace disease right greater than left. 2. Right-sided Port-A-Cath tip overlies expected SVC-RA junction. Electronically Signed   By: Ivar Drape M.D.   On: 10/27/2017 09:59   Dg Chest Port 1 View  Result Date:  10/25/2017 CLINICAL  DATA:  Congestive heart failure. EXAM: PORTABLE CHEST 1 VIEW COMPARISON:  Radiograph of October 24, 2017. FINDINGS: Stable cardiomediastinal silhouette. Stable bilateral lung opacities are noted concerning for inflammation and possible associated chronic disease. No pneumothorax or large pleural effusion is noted. Right internal jugular Port-A-Cath is unchanged compared to prior exam. IMPRESSION: Stable bilateral lung opacities as described above. Electronically Signed   By: Marijo Conception, M.D.   On: 10/25/2017 07:27   Dg Chest Port 1 View  Result Date: 10/24/2017 CLINICAL DATA:  62 year old female with acute respiratory failure. Subsequent encounter. EXAM: PORTABLE CHEST 1 VIEW COMPARISON:  10/23/2017 chest x-ray.  09/20/2017 chest CT. FINDINGS: Diffuse asymmetric airspace disease greater on the right similar to prior chest x-ray. Question infectious infiltrate superimposed upon chronic changes as versus result of multilobar lung adenocarcinoma as suggested on 09/20/2017 chest CT. MediPort catheter tip caval atrial junction level. No pneumothorax noted. Cardiomegaly. IMPRESSION: 1. No significant change in appearance of diffuse asymmetric airspace disease greater on the right. Question infectious infiltrate superimposed upon chronic changes as versus result of multilobar lung adenocarcinoma as suggested on 09/20/2017 chest CT. 2. Cardiomegaly. 3.  Aortic Atherosclerosis (ICD10-I70.0). Electronically Signed   By: Genia Del M.D.   On: 10/24/2017 07:31   Dg Chest Port 1 View  Result Date: 10/23/2017 CLINICAL DATA:  Shortness of breath EXAM: PORTABLE CHEST 1 VIEW COMPARISON:  Chest CT 09/20/2017 FINDINGS: Extensive reticular opacities throughout the right lung are compatible with the findings of the chest CT 09/20/2017, but have progressed since that time. The left lung remains relatively clear. Right chest wall Port-A-Cath tip is at the cavoatrial junction.Small right pleural  effusion. IMPRESSION: Chronic right lung reticular opacities, which have progressed compared to the radiograph 09/15/2017. Small right pleural effusion. Electronically Signed   By: Ulyses Jarred M.D.   On: 10/23/2017 06:14     CBC Recent Labs  Lab 10/21/17 1443 10/23/17 0538 10/24/17 0901 10/26/17 0727 10/27/17 0438  WBC 15.1* 14.0* 14.2* 18.3* 18.2*  HGB 9.5* 9.5* 8.0* 8.6* 8.9*  HCT 29.6* 30.3* 25.0* 27.4* 28.6*  PLT 308 362 309 378 387  MCV 101.7* 104.1* 104.2* 103.4* 104.4*  MCH 32.6 32.6 33.3 32.5 32.5  MCHC 32.1 31.4 32.0 31.4 31.1  RDW 19.3* 19.2* 19.1* 19.2* 19.2*  LYMPHSABS 0.3* 1.3  --   --   --   MONOABS 0.4 0.8  --   --   --   EOSABS 0.0 0.5  --   --   --   BASOSABS 0.0 0.0  --   --   --     Chemistries  Recent Labs  Lab 10/21/17 1443 10/23/17 0538 10/24/17 0901 10/25/17 0503 10/26/17 0727 10/27/17 0438  NA 133* 136 137 140 141 141  K 3.5 3.3* 3.6 3.5 3.9 3.1*  CL 95* 99 106 109 106 100  CO2 27 24 23 24 27  32  GLUCOSE 246* 139* 176* 181* 157* 152*  BUN 16 11 10 12 17 18   CREATININE 0.69 0.63 0.55 0.49 0.60 0.53  CALCIUM 8.9 8.8* 8.1* 8.5* 8.7* 9.0  MG  --   --  1.5* 2.4  --  1.8  AST 34  --   --   --   --   --   ALT 20  --   --   --   --   --   ALKPHOS 59  --   --   --   --   --   BILITOT 0.5  --   --   --   --   --    ------------------------------------------------------------------------------------------------------------------  estimated creatinine clearance is 56.4 mL/min (by C-G formula based on SCr of 0.53 mg/dL). ------------------------------------------------------------------------------------------------------------------ No results for input(s): HGBA1C in the last 72 hours. ------------------------------------------------------------------------------------------------------------------ No results for input(s): CHOL, HDL, LDLCALC, TRIG, CHOLHDL, LDLDIRECT in the last 72  hours. ------------------------------------------------------------------------------------------------------------------ No results for input(s): TSH, T4TOTAL, T3FREE, THYROIDAB in the last 72 hours.  Invalid input(s): FREET3 ------------------------------------------------------------------------------------------------------------------ No results for input(s): VITAMINB12, FOLATE, FERRITIN, TIBC, IRON, RETICCTPCT in the last 72 hours.  Coagulation profile No results for input(s): INR, PROTIME in the last 168 hours.  No results for input(s): DDIMER in the last 72 hours.  Cardiac Enzymes Recent Labs  Lab 10/23/17 0538  TROPONINI <0.03   ------------------------------------------------------------------------------------------------------------------ Invalid input(s): POCBNP    Assessment & Plan   IMPRESSION AND PLAN: Patient is 62 year old with adenocarcinoma of the lung presenting with shortness of breath  1.  Acute respiratory failure: CT scan confirms this finding This is related to progression of her lung cancer  Plan for patient to go to home with hospice tomorrow  2.  Sepsis based on tachycardia, leukocytosis, elevated respiratory rate Treatment as above  3.  Adenocarcinoma based on CT scan in September  Seen by oncology  4.  Anxiety continue home medication  5.  Miscellaneous Lovenox for DVT prophylaxis     Code Status Orders  (From admission, onward)         Start     Ordered   10/23/17 0852  Do not attempt resuscitation (DNR)  Continuous    Question Answer Comment  In the event of cardiac or respiratory ARREST Do not call a "code blue"   In the event of cardiac or respiratory ARREST Do not perform Intubation, CPR, defibrillation or ACLS   In the event of cardiac or respiratory ARREST Use medication by any route, position, wound care, and other measures to relive pain and suffering. May use oxygen, suction and manual treatment of airway obstruction  as needed for comfort.      10/23/17 0851        Code Status History    Date Active Date Inactive Code Status Order ID Comments User Context   06/21/2016 1751 06/23/2016 1653 Full Code [id number]  Nicholes Mango, MD Inpatient    Advance Directive Documentation     Most Recent Value  Type of Advance Directive  Out of facility DNR (pink MOST or yellow form)  Pre-existing out of facility DNR order (yellow form or pink MOST form)  Yellow form placed in chart (order not valid for inpatient use)  "MOST" Form in Place?  -           Consults oncology   DVT Prophylaxis  Lovenox   Lab Results  Component Value Date   PLT 387 10/27/2017     Time Spent in minutes   69min  Greater than 50% of time spent in care coordination and counseling patient regarding the condition and plan of care.   Dustin Flock M.D on 10/27/2017 at 2:05 PM  Between 7am to 6pm - Pager - [phone]  After 6pm go to [email or web address] - Proofreader  [employer]   Office  [phone]

## 2017-10-27 NOTE — Progress Notes (Signed)
Visit made to new referral for [facility] services at home. Writer met in the room with patient and her husband Herbie Baltimore to [employer] education regarding hospice services, philosophy and team approach to care with understanding voiced. DME needs discussed, patient will require oxygen, transport wheel chair, BSC and wedge pillow. Patient is currently on 10 liters via nasal cannula with oxygen saturations as high as 100%. She was able to participate in the conversation but did doze off at times. Discussed use of antianxiety medications and narcotics for symptom management. Current hospital medications include: 25 mcg fentanyl patch,  oxycodone IR 10 mg q 4 hrs, scheduled clonazepam 0.5 mg and lorazepam 1 mg q 6 hrs PRN for symptom management.  DME requested for delivery later today. Plan would be for patient to discharge home via EMS tomorrow with signed out of facility DNR in place. Hospital care team updated. Hospice information and contact numbers left with Herbie Baltimore. Will continue to follow through discharge. Thank you. Flo Shanks RN, BSN, [facility], hospital Liaison [phone]

## 2017-10-28 ENCOUNTER — Telehealth: Payer: Self-pay | Admitting: *Deleted

## 2017-10-28 ENCOUNTER — Ambulatory Visit: Payer: BLUE CROSS/BLUE SHIELD

## 2017-10-28 ENCOUNTER — Telehealth: Payer: Self-pay | Admitting: Physician Assistant

## 2017-10-28 DIAGNOSIS — F419 Anxiety disorder, unspecified: Secondary | ICD-10-CM

## 2017-10-28 DIAGNOSIS — C799 Secondary malignant neoplasm of unspecified site: Secondary | ICD-10-CM

## 2017-10-28 DIAGNOSIS — G893 Neoplasm related pain (acute) (chronic): Secondary | ICD-10-CM

## 2017-10-28 DIAGNOSIS — J9600 Acute respiratory failure, unspecified whether with hypoxia or hypercapnia: Secondary | ICD-10-CM

## 2017-10-28 LAB — CULTURE, BLOOD (ROUTINE X 2)
CULTURE: NO GROWTH
Culture: NO GROWTH
SPECIAL REQUESTS: ADEQUATE

## 2017-10-28 LAB — POTASSIUM: Potassium: 4.1 mmol/L (ref 3.5–5.1)

## 2017-10-28 LAB — CALCIUM, IONIZED: Calcium, Ionized, Serum: 5 mg/dL (ref 4.5–5.6)

## 2017-10-28 MED ORDER — LORAZEPAM 0.5 MG PO TABS: 0.5000 mg | ORAL_TABLET | Freq: Two times a day (BID) | ORAL | 0 refills | Status: DC

## 2017-10-28 MED ORDER — OXYCODONE HCL 10 MG PO TABS: 10.0000 mg | ORAL_TABLET | Freq: Four times a day (QID) | ORAL | 0 refills | Status: DC | PRN

## 2017-10-28 MED ORDER — FENTANYL 25 MCG/HR TD PT72: 25.0000 ug | MEDICATED_PATCH | TRANSDERMAL | 0 refills | Status: DC

## 2017-10-28 MED ORDER — FUROSEMIDE 40 MG PO TABS: 40.0000 mg | ORAL_TABLET | Freq: Every day | ORAL | 0 refills | Status: AC

## 2017-10-28 MED ORDER — LORAZEPAM 1 MG PO TABS: 1.0000 mg | ORAL_TABLET | Freq: Four times a day (QID) | ORAL | 0 refills | Status: AC | PRN

## 2017-10-28 NOTE — Progress Notes (Signed)
Pt being discharged home with hospice via EMS, discharge instructions reviewed with pt and husband, states understanding, pt with no complaints at discharge

## 2017-10-28 NOTE — Progress Notes (Signed)
Deanna Blair   DOB:May 27, 1955   JS#:[id number]    Subjective: Patient's breathing is overall improved with admission.  She is currently on nasal cannula.  On the floor.  Very anxious.  Tearful.  Otherwise denies any significant pain. Objective:  Vitals:   10/28/17 0859 10/28/17 1052  BP:  117/74  Pulse:  (!) 108  Resp:  (!) 22  Temp:  97.7 F (36.5 C)  SpO2: 95% 96%     Intake/Output Summary (Last 24 hours) at 10/28/2017 1729 Last data filed at 10/28/2017 1002 Gross per 24 hour  Intake 240 ml  Output -  Net 240 ml    GENERAL Alert, no distress and comfortable.  On nasal cannula.  She is alone. EYES: no pallor or icterus OROPHARYNX: no thrush or ulceration. NECK: supple, no masses felt LYMPH:  no palpable lymphadenopathy in the cervical, axillary or inguinal regions LUNGS: Decreased breath sounds bilaterally.  Positive for crackles. HEART/CVS: Tachycardic regular rhythm rhythm and no murmurs; No lower extremity edema ABDOMEN: abdomen soft, tender  on deep palpation. and normal bowel sounds Musculoskeletal:no cyanosis of digits and no clubbing  PSYCH: alert & oriented x 3 with fluent speech; anxious tearful. NEURO: no focal motor/sensory deficits SKIN:  no rashes or significant lesions   Labs:  Lab Results  Component Value Date   WBC 18.2 (H) 10/27/2017   HGB 8.9 (L) 10/27/2017   HCT 28.6 (L) 10/27/2017   MCV 104.4 (H) 10/27/2017   PLT 387 10/27/2017   NEUTROABS 11.4 (H) 10/23/2017    Lab Results  Component Value Date   NA 141 10/27/2017   K 4.1 10/28/2017   CL 100 10/27/2017   CO2 32 10/27/2017    Studies:  Dg Chest Port 1 View  Result Date: 10/27/2017 CLINICAL DATA:  Diffuse ground-glass opacities, follow-up EXAM: PORTABLE CHEST 1 VIEW COMPARISON:  CT chest of 10/26/2017 and chest x-ray of 10/25/2017 FINDINGS: There is little change to very slight worsening of the bilateral alveolar opacities right greater than left. There may be a small right effusion  present. Right-sided Port-A-Cath tip overlies the expected SVC-RA junction and mild cardiomegaly is stable. No bony abnormality is seen. IMPRESSION: 1. Little change to very minimal worsening of diffuse airspace disease right greater than left. 2. Right-sided Port-A-Cath tip overlies expected SVC-RA junction. Electronically Signed   By: Ivar Drape M.D.   On: 10/27/2017 09:59    Assessment & Plan:   # 62 year old female patient with history of metastatic adenocarcinoma the lung currently admitted to hospital for acute on chronic respiratory failure.  # Metastatic adenocarcinoma the lung status post multiple lines of therapy-October 23 CT scan shows progressive disease. Patient poor candidate for any further therapy at this time.  Agree with hospice at home.  #Acute respiratory failure-likely secondary to progression of disease; some improvement noted.  Patient being discharged on nasal cannula oxygen.  #Pain control-second malignancy.  Continue oxycodone for now.   #Anxiety-continue anxiolytic for now.    #Discussed with Dr. Posey Pronto. Discussed with patient that we will take care of patient's pain/anxiety in hospice.   Cammie Sickle, MD 10/28/2017  5:29 PM

## 2017-10-28 NOTE — Telephone Encounter (Signed)
Patient is scheduled for MRI Brain Monday with and without contrast, She is in hospital and they are asking if this needs to be cancelled or rescheduled.  Per her chart, she is to be discharged today. She is being referred to Hospice. Please advise

## 2017-10-28 NOTE — Care Management (Signed)
Patient for discharge home today under the care of [facility]. She will transport by ems.  EMS can accommodate high oxygen requirements.  Patient to discharge with her current oxygen tubing and asking cardiopulmonary to provide another identical set so patient will have 2.  Hospice will provide same day admission.

## 2017-10-28 NOTE — Discharge Summary (Signed)
[facility] - [place] at [facility], 62 y.o., DOB 06-23-1955, MRN [id number]. Admission date: 10/23/2017 Discharge Date 10/28/2017 Primary MD Mar Daring, PA-C Admitting Physician Dustin Flock, MD  Admission Diagnosis  Sepsis, due to unspecified organism, unspecified whether acute organ dysfunction present [facility]) [A41.9]  Discharge Diagnosis   Active Problems: Acute respiratory failure due to progression of adenocarcinoma the lung Sepsis on admission based on tachycardia leukocytosis elevated respiratory rate this was related to acute respiratory failure, sepsis ruled out [place] [facility] Course   Patient is a 62 year old with adenocarcinoma of the lung presented with shortness of breath initially was thought to have worsening pneumonia.  Patient was noted to have tachycardia tachypnea and elevated white blood cell count. She was initially treated for pneumonia however condition continued to worsen.  CT scan of the chest confirmed that she likely has progression of her adenocarcinoma.  She was seen by oncology and and patient will now be going home with hospice.  The goal of comfort care.        Consults  hematology/oncology , intesivist Significant Tests:  See full reports for all details     Ct Chest Wo Contrast  Result Date: 10/26/2017 CLINICAL DATA:  Inpatient. Acute on chronic respiratory failure. Metastatic right lung adenocarcinoma. EXAM: CT CHEST WITHOUT CONTRAST TECHNIQUE: Multidetector CT imaging of the chest was performed following the standard protocol without IV contrast. COMPARISON:  09/20/2017 chest CT. Chest radiograph from one day prior. FINDINGS: Cardiovascular: Normal heart size. No significant pericardial effusion/thickening. Left anterior descending coronary atherosclerosis. Right internal jugular MediPort terminates at the cavoatrial junction. Atherosclerotic nonaneurysmal thoracic aorta. Normal  caliber pulmonary arteries. Mediastinum/Nodes: No discrete thyroid nodules. Unremarkable esophagus. No axillary adenopathy. No pathologically enlarged mediastinal nodes. No discrete hilar adenopathy on this noncontrast scan. Lungs/Pleura: No pneumothorax. Trace dependent bilateral pleural effusions, new bilaterally. There is extensive patchy consolidation and ground-glass opacity throughout the right greater than left lungs with extensive cavitary change throughout the right lower lobe. These findings involve all of the lung lobes and have substantially worsened throughout both lungs, most notably in the left lung with new involvement of the left lower lobe and significantly increased involvement of the left upper lobe. Upper abdomen: No acute abnormality. Musculoskeletal:  No aggressive appearing focal osseous lesions. IMPRESSION: 1. Extensive patchy consolidation and ground-glass opacity throughout the right greater than left lungs involving all lung lobes with extensive cavitary change in the right lower lobe. Findings have substantially worsened since 09/20/2017 chest CT, most notably in the left lung. Findings are most suggestive of continued progression of multilobar lung adenocarcinoma, although a superimposed process such as diffuse alveolar hemorrhage or an inflammatory process such as acute interstitial pneumonia/ARDS, atypical pneumonia, drug reaction or hypersensitivity pneumonitis cannot be excluded. 2. New trace dependent bilateral pleural effusions. 3. One vessel coronary atherosclerosis. Aortic Atherosclerosis (ICD10-I70.0). Electronically Signed   By: Ilona Sorrel M.D.   On: 10/26/2017 13:31   Dg Chest Port 1 View  Result Date: 10/27/2017 CLINICAL DATA:  Diffuse ground-glass opacities, follow-up EXAM: PORTABLE CHEST 1 VIEW COMPARISON:  CT chest of 10/26/2017 and chest x-ray of 10/25/2017 FINDINGS: There is little change to very slight worsening of the bilateral alveolar opacities right greater  than left. There may be a small right effusion present. Right-sided Port-A-Cath tip overlies the expected SVC-RA junction and mild cardiomegaly is stable. No bony abnormality is seen. IMPRESSION: 1. Little change to very minimal worsening of diffuse airspace  disease right greater than left. 2. Right-sided Port-A-Cath tip overlies expected SVC-RA junction. Electronically Signed   By: Ivar Drape M.D.   On: 10/27/2017 09:59   Dg Chest Port 1 View  Result Date: 10/25/2017 CLINICAL DATA:  Congestive heart failure. EXAM: PORTABLE CHEST 1 VIEW COMPARISON:  Radiograph of October 24, 2017. FINDINGS: Stable cardiomediastinal silhouette. Stable bilateral lung opacities are noted concerning for inflammation and possible associated chronic disease. No pneumothorax or large pleural effusion is noted. Right internal jugular Port-A-Cath is unchanged compared to prior exam. IMPRESSION: Stable bilateral lung opacities as described above. Electronically Signed   By: Marijo Conception, M.D.   On: 10/25/2017 07:27   Dg Chest Port 1 View  Result Date: 10/24/2017 CLINICAL DATA:  62 year old female with acute respiratory failure. Subsequent encounter. EXAM: PORTABLE CHEST 1 VIEW COMPARISON:  10/23/2017 chest x-ray.  09/20/2017 chest CT. FINDINGS: Diffuse asymmetric airspace disease greater on the right similar to prior chest x-ray. Question infectious infiltrate superimposed upon chronic changes as versus result of multilobar lung adenocarcinoma as suggested on 09/20/2017 chest CT. MediPort catheter tip caval atrial junction level. No pneumothorax noted. Cardiomegaly. IMPRESSION: 1. No significant change in appearance of diffuse asymmetric airspace disease greater on the right. Question infectious infiltrate superimposed upon chronic changes as versus result of multilobar lung adenocarcinoma as suggested on 09/20/2017 chest CT. 2. Cardiomegaly. 3.  Aortic Atherosclerosis (ICD10-I70.0). Electronically Signed   By: Genia Del  M.D.   On: 10/24/2017 07:31   Dg Chest Port 1 View  Result Date: 10/23/2017 CLINICAL DATA:  Shortness of breath EXAM: PORTABLE CHEST 1 VIEW COMPARISON:  Chest CT 09/20/2017 FINDINGS: Extensive reticular opacities throughout the right lung are compatible with the findings of the chest CT 09/20/2017, but have progressed since that time. The left lung remains relatively clear. Right chest wall Port-A-Cath tip is at the cavoatrial junction.Small right pleural effusion. IMPRESSION: Chronic right lung reticular opacities, which have progressed compared to the radiograph 09/15/2017. Small right pleural effusion. Electronically Signed   By: Ulyses Jarred M.D.   On: 10/23/2017 06:14       Today   Subjective:   Deanna Blair patient doing better anxiety under control pain under control Objective:   Blood pressure 117/74, pulse (!) 108, temperature 97.7 F (36.5 C), temperature source Oral, resp. rate (!) 22, height 5\' 3"  (1.6 m), weight 49 kg, last menstrual period 06/16/2000, SpO2 96 %.  .  Intake/Output Summary (Last 24 hours) at 10/28/2017 1340 Last data filed at 10/28/2017 1002 Gross per 24 hour  Intake 520.75 ml  Output 150 ml  Net 370.75 ml    Exam VITAL SIGNS: Blood pressure 117/74, pulse (!) 108, temperature 97.7 F (36.5 C), temperature source Oral, resp. rate (!) 22, height 5\' 3"  (1.6 m), weight 49 kg, last menstrual period 06/16/2000, SpO2 96 %.  GENERAL:  62 y.o.-year-old patient lying in the bed with no acute distress.  EYES: Pupils equal, round, reactive to light and accommodation. No scleral icterus. Extraocular muscles intact.  HEENT: Head atraumatic, normocephalic. Oropharynx and nasopharynx clear.  NECK:  Supple, no jugular venous distention. No thyroid enlargement, no tenderness.  LUNGS: Normal breath sounds bilaterally, no wheezing, rales,rhonchi or crepitation. No use of accessory muscles of respiration.  CARDIOVASCULAR: S1, S2 normal. No murmurs, rubs, or gallops.   ABDOMEN: Soft, nontender, nondistended. Bowel sounds present. No organomegaly or mass.  EXTREMITIES: No pedal edema, cyanosis, or clubbing.  NEUROLOGIC: Cranial nerves II through XII are intact. Muscle strength 5/5  in all extremities. Sensation intact. Gait not checked.  PSYCHIATRIC: The patient is alert and oriented x 3.  SKIN: No obvious rash, lesion, or ulcer.   Data Review     CBC w Diff:  Lab Results  Component Value Date   WBC 18.2 (H) 10/27/2017   HGB 8.9 (L) 10/27/2017   HGB 11.4 01/03/2015   HCT 28.6 (L) 10/27/2017   HCT 34.1 01/03/2015   PLT 387 10/27/2017   PLT 803 (HH) 12/20/2014   LYMPHOPCT 9 10/23/2017   MONOPCT 6 10/23/2017   EOSPCT 3 10/23/2017   BASOPCT 0 10/23/2017   CMP:  Lab Results  Component Value Date   NA 141 10/27/2017   NA 139 01/03/2015   K 4.1 10/28/2017   CL 100 10/27/2017   CO2 32 10/27/2017   BUN 18 10/27/2017   BUN 10 01/03/2015   CREATININE 0.53 10/27/2017   PROT 6.3 (L) 10/21/2017   PROT 6.5 01/03/2015   ALBUMIN 2.9 (L) 10/21/2017   ALBUMIN 3.8 01/03/2015   BILITOT 0.5 10/21/2017   BILITOT <0.2 01/03/2015   ALKPHOS 59 10/21/2017   AST 34 10/21/2017   ALT 20 10/21/2017  .  Micro Results Recent Results (from the past 240 hour(s))  Blood Culture (routine x 2)     Status: None   Collection Time: 10/23/17  5:45 AM  Result Value Ref Range Status   Specimen Description BLOOD BLOOD LEFT FOREARM  Final   Special Requests   Final    BOTTLES DRAWN AEROBIC AND ANAEROBIC Blood Culture results may not be optimal due to an inadequate volume of blood received in culture bottles   Culture   Final    NO GROWTH 5 DAYS Performed at [facility], [address] [place], [place] [address]    Report Status 10/28/2017 FINAL  Final  Blood Culture (routine x 2)     Status: None   Collection Time: 10/23/17  5:45 AM  Result Value Ref Range Status   Specimen Description BLOOD BLOOD RIGHT FOREARM  Final   Special Requests   Final     BOTTLES DRAWN AEROBIC AND ANAEROBIC Blood Culture adequate volume   Culture   Final    NO GROWTH 5 DAYS Performed at [facility], [address] [place], [place] [address]    Report Status 10/28/2017 FINAL  Final  MRSA PCR Screening     Status: None   Collection Time: 10/23/17  9:47 AM  Result Value Ref Range Status   MRSA by PCR NEGATIVE NEGATIVE Final    Comment:        The GeneXpert MRSA Assay (FDA approved for NASAL specimens only), is one component of a comprehensive MRSA colonization surveillance program. It is not intended to diagnose MRSA infection nor to guide or monitor treatment for MRSA infections. Performed at [facility], [address] [place], [place] [address]         Code Status Orders  (From admission, onward)         Start     Ordered   10/23/17 7829  Do not attempt resuscitation (DNR)  Continuous    Question Answer Comment  In the event of cardiac or respiratory ARREST Do not call a "code blue"   In the event of cardiac or respiratory ARREST Do not perform Intubation, CPR, defibrillation or ACLS   In the event of cardiac or respiratory ARREST Use medication by any route, position, wound care, and other measures to relive  pain and suffering. May use oxygen, suction and manual treatment of airway obstruction as needed for comfort.      10/23/17 0851        Code Status History    Date Active Date Inactive Code Status Order ID Comments User Context   06/21/2016 1751 06/23/2016 1653 Full Code [id number]  Nicholes Mango, MD Inpatient    Advance Directive Documentation     Most Recent Value  Type of Advance Directive  Out of facility DNR (pink MOST or yellow form)  Pre-existing out of facility DNR order (yellow form or pink MOST form)  Yellow form placed in chart (order not valid for inpatient use)  "MOST" Form in Place?  -          Follow-up Information    Mar Daring, PA-C. Go on 11/09/2017.   Specialty:  Family  Medicine Why:  @12 :00 PM Contact information: [address] [place] [address] [phone]           Discharge Medications   Allergies as of 10/28/2017   No Known Allergies     Medication List    STOP taking these medications   chlorpheniramine-HYDROcodone 10-8 MG/5ML Suer Commonly known as:  TUSSIONEX   dexamethasone 4 MG tablet Commonly known as:  DECADRON   potassium chloride SA 20 MEQ tablet Commonly known as:  K-DUR,KLOR-CON   predniSONE 10 MG tablet Commonly known as:  DELTASONE   prochlorperazine 10 MG tablet Commonly known as:  COMPAZINE     TAKE these medications   acetaminophen 500 MG tablet Commonly known as:  TYLENOL Take 1,000 mg by mouth every 4 (four) hours as needed for mild pain or moderate pain.   albuterol (2.5 MG/3ML) 0.083% nebulizer solution Commonly known as:  PROVENTIL Take 2.5 mg by nebulization every 6 (six) hours as needed for wheezing.   VENTOLIN HFA 108 (90 Base) MCG/ACT inhaler Generic drug:  albuterol Inhale 1 puff into the lungs every 6 (six) hours as needed for wheezing or shortness of breath.   BEVESPI AEROSPHERE 9-4.8 MCG/ACT Aero Generic drug:  Glycopyrrolate-Formoterol Inhale 2 puffs into the lungs 2 (two) times daily.   docusate sodium 100 MG capsule Commonly known as:  COLACE Take 100 mg by mouth 2 (two) times daily.   fentaNYL 25 MCG/HR patch Commonly known as:  DURAGESIC - dosed mcg/hr Place 1 patch (25 mcg total) onto the skin every 3 (three) days.   Fluticasone-Umeclidin-Vilant 100-62.5-25 MCG/INH Aepb Inhale 1 puff into the lungs daily.   furosemide 40 MG tablet Commonly known as:  LASIX Take 1 tablet (40 mg total) by mouth daily.   ibuprofen 400 MG tablet Commonly known as:  ADVIL,MOTRIN Take 1 tablet (400 mg total) by mouth 3 (three) times daily as needed.   lidocaine-prilocaine cream Commonly known as:  EMLA Apply 1 application topically as needed.   LORazepam 1 MG  tablet Commonly known as:  ATIVAN Take 1 tablet (1 mg total) by mouth every 6 (six) hours as needed for anxiety. What changed:    medication strength  how much to take  when to take this  reasons to take this  additional instructions   ondansetron 8 MG tablet Commonly known as:  ZOFRAN Take 1 tablet (8 mg total) by mouth every 8 (eight) hours as needed for nausea or vomiting (start 3 days; after chemo).   Oxycodone HCl 10 MG Tabs Take 1 tablet (10 mg total) by mouth every 6 (six) hours as needed (  moderate to severe pain).   polyethylene glycol packet Commonly known as:  MIRALAX / GLYCOLAX Take 17 g by mouth daily as needed for mild constipation or moderate constipation.            Durable Medical Equipment  (From admission, onward)         Start     Ordered   10/28/17 0846  DME Oxygen  Once    Question Answer Comment  Mode or (Route) Nasal cannula   Liters per Minute 6   Frequency Continuous (stationary and portable oxygen unit needed)   Oxygen delivery system Gas      10/28/17 0847             Total Time in preparing paper work, data evaluation and todays exam - 43 minutes  Dustin Flock M.D on 10/28/2017 at 1:40 PM [place]  [phone]

## 2017-10-28 NOTE — Telephone Encounter (Signed)
Pt is scheduled for hospital follow up with Jenni on 11/09/17 @ 12 pm. Pt is being discharged from hospital today 10/28/17. Please advise. Thanks TNP

## 2017-10-28 NOTE — Consult Note (Addendum)
Pharmacy Electrolyte Monitoring Consult:  Pharmacy consulted to assist in monitoring and replacing electrolytes in this 62 y.o. female admitted on 10/23/2017 with Shortness of Breath   Labs:  Sodium (mmol/L)  Date Value  10/27/2017 141  01/03/2015 139   Potassium (mmol/L)  Date Value  10/28/2017 4.1   Magnesium (mg/dL)  Date Value  10/27/2017 1.8   Phosphorus (mg/dL)  Date Value  10/27/2017 4.0   Calcium (mg/dL)  Date Value  10/27/2017 9.0   Albumin (g/dL)  Date Value  10/21/2017 2.9 (L)  01/03/2015 3.8    Assessment/Plan: Goals: Magnesium ~2.0, Potassium ~4.0  No replacement needed at this time. Plans to transition to home hospice.   Will sign off at this time.   Pernell Dupre, PharmD, BCPS Clinical Pharmacist 10/28/2017 9:31 AM

## 2017-10-28 NOTE — Progress Notes (Signed)
Patient being discharged home this morning.  Awaiting EMS pick up.  Per patient's husband, equipment is at the patient's home.  Notified referral intake of discharge plans and need for same day admission.  Will fax discharge summary when complete.  Dimas Aguas, RN [facility]

## 2017-10-28 NOTE — Telephone Encounter (Signed)
Scheduling [phone]) notified and will cancel appointment

## 2017-10-28 NOTE — Telephone Encounter (Signed)
[place]-

## 2017-10-30 ENCOUNTER — Ambulatory Visit: Payer: BLUE CROSS/BLUE SHIELD

## 2017-10-31 ENCOUNTER — Ambulatory Visit: Payer: BLUE CROSS/BLUE SHIELD | Admitting: Internal Medicine

## 2017-10-31 ENCOUNTER — Ambulatory Visit: Payer: BLUE CROSS/BLUE SHIELD

## 2017-10-31 ENCOUNTER — Other Ambulatory Visit: Payer: Self-pay | Admitting: *Deleted

## 2017-10-31 ENCOUNTER — Other Ambulatory Visit: Payer: BLUE CROSS/BLUE SHIELD

## 2017-10-31 MED ORDER — MORPHINE SULFATE ER 30 MG PO TBCR: 30.0000 mg | EXTENDED_RELEASE_TABLET | Freq: Two times a day (BID) | ORAL | 0 refills | Status: DC

## 2017-10-31 MED ORDER — MORPHINE SULFATE ER 15 MG PO TBCR: 15.0000 mg | EXTENDED_RELEASE_TABLET | Freq: Two times a day (BID) | ORAL | 0 refills | Status: DC

## 2017-10-31 NOTE — Telephone Encounter (Signed)
Patient on Fentanyl for pain control and has lost considerable amt of weight. Hospice requesting to change Fentanyl 25 mcg every 3 d to MS ER. Please advise

## 2017-10-31 NOTE — Telephone Encounter (Signed)
Brenda- Dc fenatnyl; add MS contin 45 mg BID; please send me script to sign. Thx

## 2017-10-31 NOTE — Telephone Encounter (Signed)
Transition Care Management Follow-up Telephone Call  Date of discharge and from where: [facility] on 10/28/17.  How have you been since you were released from the hospital? Doing ok. Was a little out of it this morning but that is related to pain medication. Not breathing as deep but is using nebulizer 3 times a day. Declines any other s/s.  Any questions or concerns? No   Items Reviewed:  Did the pt receive and understand the discharge instructions provided? Yes   Medications obtained and verified? Will review at [place] apt.  Any new allergies since your discharge? No   Dietary orders reviewed? N/A  Do you have support at home? Yes   Other (ie: DME, Home Health, etc) Hospice, oxygen (on 6L currently), portable toilet and wheelchair.  Functional Questionnaire: (I = Independent and D = Dependent)  Bathing/Dressing- Has assistance due to weakness.   Meal Prep- D  Eating- I  Maintaining continence- I  Transferring/Ambulation- I  Managing Meds- D   Follow up appointments reviewed:    PCP Hospital f/u appt confirmed? Yes  Scheduled to see Fenton Malling on 11/09/17 @ 12:00 PM.  [facility] f/u appt confirmed? N/A  Are transportation arrangements needed? No   If their condition worsens, is the pt aware to call  their PCP or go to the ED? Yes  Was the patient provided with contact information for the PCP's office or ED? Yes  Was the pt encouraged to call back with questions or concerns? Yes

## 2017-10-31 NOTE — Telephone Encounter (Signed)
Hospice informed of medicine changes

## 2017-11-02 ENCOUNTER — Inpatient Hospital Stay: Payer: BLUE CROSS/BLUE SHIELD

## 2017-11-02 ENCOUNTER — Inpatient Hospital Stay: Payer: BLUE CROSS/BLUE SHIELD | Admitting: Internal Medicine

## 2017-11-04 ENCOUNTER — Telehealth: Payer: Self-pay | Admitting: *Deleted

## 2017-11-07 ENCOUNTER — Telehealth: Payer: Self-pay | Admitting: *Deleted

## 2017-11-07 ENCOUNTER — Other Ambulatory Visit: Payer: Self-pay | Admitting: *Deleted

## 2017-11-07 ENCOUNTER — Telehealth: Payer: Self-pay | Admitting: Internal Medicine

## 2017-11-07 ENCOUNTER — Other Ambulatory Visit: Payer: Self-pay | Admitting: Nurse Practitioner

## 2017-11-07 DIAGNOSIS — C3431 Malignant neoplasm of lower lobe, right bronchus or lung: Secondary | ICD-10-CM

## 2017-11-07 DIAGNOSIS — G893 Neoplasm related pain (acute) (chronic): Secondary | ICD-10-CM

## 2017-11-07 MED ORDER — MORPHINE SULFATE ER 15 MG PO TBCR: 15.0000 mg | EXTENDED_RELEASE_TABLET | Freq: Three times a day (TID) | ORAL | 0 refills | Status: DC

## 2017-11-07 MED ORDER — MORPHINE SULFATE ER 30 MG PO TBCR: 30.0000 mg | EXTENDED_RELEASE_TABLET | Freq: Three times a day (TID) | ORAL | 0 refills | Status: DC

## 2017-11-07 MED ORDER — OXYCODONE HCL 10 MG PO TABS: 10.0000 mg | ORAL_TABLET | Freq: Four times a day (QID) | ORAL | 0 refills | Status: DC | PRN

## 2017-11-07 NOTE — Telephone Encounter (Signed)
Hospice requesting refill for Oxycondone 10mg . Send to [facility] on [address], [place].

## 2017-11-07 NOTE — Telephone Encounter (Signed)
Narcotics orders signed by NP. thx

## 2017-11-07 NOTE — Progress Notes (Signed)
Hospice called Deanna Blair on behalf of patient requesting refill of oxycodone.   As mandated by the [place] STOP Act (Strengthen Opioid Misuse Prevention), the [place] Controlled Substance Reporting System ([place]) was reviewed for this patient.  Below is the past 82-months of controlled substance prescriptions as displayed by the registry.  I have personally consulted with my supervising physician, Dr. Rogue Bussing, who agrees that continuation of opiate therapy is medically appropriate at this time and agrees to provide continual monitoring, including urine/blood drug screens, as indicated. Prescription sent electronically using [employer] secure transmission to requested pharmacy.   [place] Reviewed:     Beckey Rutter, DNP, AGNP-C [place] at [facility] [phone] (work cell) [phone] (office) 11/07/17 9:49 AM

## 2017-11-07 NOTE — Telephone Encounter (Signed)
Hospital f/u is not necessary since she is with Hospice.

## 2017-11-07 NOTE — Telephone Encounter (Signed)
Hospice called requesting to increase MS contin to every 8 hours.

## 2017-11-07 NOTE — Telephone Encounter (Signed)
Per Dr Rogue Bussing, he told husband Only If Patient was having Severe Pain he could give Oxy every 4 hours as needed, NOT to give it to her every 4 hours. Per Ander Purpura, she already approved MS ER increase and sent in new prescription. We also spoke with Judson Roch regarding concerns of reported oversedation and she reports that the husband has been setting an alarm to give her Oxycodone every 4 hours around the clock. She will re educate him on the use of the Oxycodone only if she is in pain and NOT to set an alarm to awaken her to to take it.

## 2017-11-07 NOTE — Telephone Encounter (Signed)
Called and left message.

## 2017-11-07 NOTE — Telephone Encounter (Addendum)
Patient spouse reports that they were told she can take Oxycodone every 4 hours as needed and that is what they have been doing. New prescription sent was only a 5 day supple and not 2 weeks per hospice protocol and for every 6 hours as needed . Needs new prescription sent in for every 4 hours as needed and # 90. Also asking again about increasing MS ER to every 8 hours Please advise

## 2017-11-07 NOTE — Telephone Encounter (Signed)
Patient canceled appointment-per provider ok to cance

## 2017-11-09 ENCOUNTER — Inpatient Hospital Stay: Payer: BLUE CROSS/BLUE SHIELD | Admitting: Physician Assistant

## 2017-11-09 ENCOUNTER — Other Ambulatory Visit: Payer: Self-pay | Admitting: *Deleted

## 2017-11-09 DIAGNOSIS — C3431 Malignant neoplasm of lower lobe, right bronchus or lung: Secondary | ICD-10-CM

## 2017-11-09 DIAGNOSIS — G893 Neoplasm related pain (acute) (chronic): Secondary | ICD-10-CM

## 2017-11-09 MED ORDER — OXYCODONE HCL 10 MG PO TABS: 10.0000 mg | ORAL_TABLET | Freq: Four times a day (QID) | ORAL | 0 refills | Status: AC | PRN

## 2017-11-09 NOTE — Telephone Encounter (Signed)
Hospice nurse called reporting that patient will need a refill over weekend of her Oxycodone and that she is only taking it every 6 hours now

## 2017-11-14 ENCOUNTER — Other Ambulatory Visit: Payer: Self-pay | Admitting: *Deleted

## 2017-11-14 MED ORDER — MORPHINE SULFATE ER 15 MG PO TBCR: 15.0000 mg | EXTENDED_RELEASE_TABLET | Freq: Three times a day (TID) | ORAL | 0 refills | Status: DC

## 2017-11-14 MED ORDER — MORPHINE SULFATE ER 30 MG PO TBCR: 30.0000 mg | EXTENDED_RELEASE_TABLET | Freq: Three times a day (TID) | ORAL | 0 refills | Status: DC

## 2017-11-18 ENCOUNTER — Other Ambulatory Visit: Payer: Self-pay | Admitting: *Deleted

## 2017-11-18 MED ORDER — MORPHINE SULFATE (CONCENTRATE) 20 MG/ML PO SOLN: ORAL | 0 refills | Status: DC

## 2017-11-18 NOTE — Telephone Encounter (Signed)
Hospice nurse Clarise Cruz called to report that patient is having increased shortness of breath and is needing Roxanol to ease her breathing. Patient has cut back the use of her Oxycodone to only twice a day with the addition of the MSER.

## 2017-11-23 ENCOUNTER — Other Ambulatory Visit: Payer: Self-pay | Admitting: *Deleted

## 2017-11-23 MED ORDER — MORPHINE SULFATE ER 15 MG PO TBCR: 15.0000 mg | EXTENDED_RELEASE_TABLET | Freq: Three times a day (TID) | ORAL | 0 refills | Status: AC

## 2017-11-23 MED ORDER — MORPHINE SULFATE ER 30 MG PO TBCR: 30.0000 mg | EXTENDED_RELEASE_TABLET | Freq: Three times a day (TID) | ORAL | 0 refills | Status: AC

## 2017-11-23 NOTE — Telephone Encounter (Signed)
Patient called cancer center requesting refill of MS Contin 45 mg q 8 hours.   As mandated by the [place] STOP Act (Strengthen Opioid Misuse Prevention), the  Controlled Substance Reporting System ([place]) was reviewed for this patient.  Below is the past 8-months of controlled substance prescriptions as displayed by the registry.  I have personally consulted with my supervising physician,Dr. Rogue Bussing, who agrees that continuation of opiate therapy is medically appropriate at this time and agrees to provide continual monitoring, including urine/blood drug screens, as indicated. Refill is appropriate on or after 11/26/17 .  [facility] reviewed:     Faythe Casa, NP 11/23/2017 4:09 PM [phone]

## 2017-11-23 NOTE — Telephone Encounter (Signed)
Erroneous encounter

## 2017-11-28 ENCOUNTER — Other Ambulatory Visit: Payer: Self-pay | Admitting: *Deleted

## 2017-11-28 MED ORDER — MORPHINE SULFATE (CONCENTRATE) 20 MG/ML PO SOLN: ORAL | 0 refills | Status: DC

## 2017-11-29 ENCOUNTER — Other Ambulatory Visit: Payer: Self-pay | Admitting: *Deleted

## 2017-11-29 MED ORDER — MORPHINE SULFATE (CONCENTRATE) 20 MG/ML PO SOLN: ORAL | 0 refills | Status: DC

## 2017-11-29 NOTE — Telephone Encounter (Signed)
[facility] does not have any Roxanol and needs prescription sent to [facility].

## 2017-11-30 ENCOUNTER — Other Ambulatory Visit: Payer: Self-pay | Admitting: *Deleted

## 2017-11-30 MED ORDER — MORPHINE SULFATE (CONCENTRATE) 20 MG/ML PO SOLN: ORAL | 0 refills | Status: AC

## 2017-11-30 NOTE — Telephone Encounter (Signed)
-----   Message from Shawnee Knapp, RN sent at 11/30/2017 10:38 AM EST ----- Regarding: PAIN CONTROL Good morning Otila Kluver with Hospice contacted triage today about patient's pain control.  She is currently taking MS Contin 45 mg TID Oxy 10 mg Every 6 hours Roxanol (filled yesterday) 20  every 2-3 hours Her right sided abdominal pain is worse and cannot get pain control  The RN requested another Roxanol refill to get her through the weekend and possible tweak the MS contin or oxy dose? Please contact Otila Kluver [phone]

## 2017-12-05 ENCOUNTER — Ambulatory Visit: Payer: Self-pay | Admitting: Internal Medicine

## 2018-01-04 DEATH — deceased

## 2018-01-26 ENCOUNTER — Encounter: Payer: Self-pay | Admitting: Internal Medicine
